# Patient Record
Sex: Female | Born: 1960 | Race: Black or African American | Hispanic: No | Marital: Married | Smoking: Never smoker
Health system: Southern US, Community
[De-identification: ages and names within clinical notes are randomized; demographics above are authoritative.]

## PROBLEM LIST (undated history)

## (undated) ENCOUNTER — Emergency Department (HOSPITAL_COMMUNITY): Discharge status: Absent without leave | Payer: Self-pay

## (undated) DIAGNOSIS — D219 Benign neoplasm of connective and other soft tissue, unspecified: Secondary | ICD-10-CM

## (undated) DIAGNOSIS — M25551 Pain in right hip: Secondary | ICD-10-CM

## (undated) DIAGNOSIS — I10 Essential (primary) hypertension: Secondary | ICD-10-CM

## (undated) DIAGNOSIS — IMO0002 Reserved for concepts with insufficient information to code with codable children: Secondary | ICD-10-CM

## (undated) DIAGNOSIS — E785 Hyperlipidemia, unspecified: Secondary | ICD-10-CM

## (undated) DIAGNOSIS — K219 Gastro-esophageal reflux disease without esophagitis: Secondary | ICD-10-CM

## (undated) DIAGNOSIS — C641 Malignant neoplasm of right kidney, except renal pelvis: Secondary | ICD-10-CM

## (undated) DIAGNOSIS — Z9889 Other specified postprocedural states: Secondary | ICD-10-CM

## (undated) DIAGNOSIS — G459 Transient cerebral ischemic attack, unspecified: Secondary | ICD-10-CM

## (undated) DIAGNOSIS — D49519 Neoplasm of unspecified behavior of unspecified kidney: Secondary | ICD-10-CM

## (undated) DIAGNOSIS — M1990 Unspecified osteoarthritis, unspecified site: Secondary | ICD-10-CM

## (undated) DIAGNOSIS — R87619 Unspecified abnormal cytological findings in specimens from cervix uteri: Secondary | ICD-10-CM

## (undated) DIAGNOSIS — R87629 Unspecified abnormal cytological findings in specimens from vagina: Secondary | ICD-10-CM

## (undated) DIAGNOSIS — R112 Nausea with vomiting, unspecified: Secondary | ICD-10-CM

## (undated) DIAGNOSIS — D251 Intramural leiomyoma of uterus: Secondary | ICD-10-CM

## (undated) DIAGNOSIS — Z780 Asymptomatic menopausal state: Secondary | ICD-10-CM

## (undated) HISTORY — DX: Unspecified abnormal cytological findings in specimens from cervix uteri: R87.619

## (undated) HISTORY — PX: PARTIAL HYSTERECTOMY: SHX80

## (undated) HISTORY — PX: ROTATOR CUFF REPAIR: SHX139

## (undated) HISTORY — DX: Benign neoplasm of connective and other soft tissue, unspecified: D21.9

## (undated) HISTORY — DX: Hyperlipidemia, unspecified: E78.5

## (undated) HISTORY — DX: Pain in right hip: M25.551

## (undated) HISTORY — DX: Malignant neoplasm of right kidney, except renal pelvis: C64.1

## (undated) HISTORY — DX: Asymptomatic menopausal state: Z78.0

## (undated) HISTORY — PX: TUBAL LIGATION: SHX77

## (undated) HISTORY — DX: Unspecified osteoarthritis, unspecified site: M19.90

## (undated) HISTORY — DX: Unspecified abnormal cytological findings in specimens from vagina: R87.629

## (undated) HISTORY — DX: Intramural leiomyoma of uterus: D25.1

## (undated) HISTORY — DX: Reserved for concepts with insufficient information to code with codable children: IMO0002

---

## 2001-02-14 ENCOUNTER — Other Ambulatory Visit: Admission: RE | Admit: 2001-02-14 | Discharge: 2001-02-14 | Payer: Self-pay | Admitting: Obstetrics and Gynecology

## 2001-07-10 ENCOUNTER — Encounter: Payer: Self-pay | Admitting: Family Medicine

## 2001-07-10 ENCOUNTER — Ambulatory Visit (HOSPITAL_COMMUNITY): Admission: RE | Admit: 2001-07-10 | Discharge: 2001-07-10 | Payer: Self-pay | Admitting: Family Medicine

## 2001-12-17 ENCOUNTER — Emergency Department (HOSPITAL_COMMUNITY): Admission: EM | Admit: 2001-12-17 | Discharge: 2001-12-17 | Payer: Self-pay | Admitting: Internal Medicine

## 2003-01-24 ENCOUNTER — Encounter: Payer: Self-pay | Admitting: Specialist

## 2003-01-24 ENCOUNTER — Ambulatory Visit (HOSPITAL_COMMUNITY): Admission: RE | Admit: 2003-01-24 | Discharge: 2003-01-24 | Payer: Self-pay | Admitting: Specialist

## 2005-06-28 ENCOUNTER — Ambulatory Visit (HOSPITAL_COMMUNITY): Admission: RE | Admit: 2005-06-28 | Discharge: 2005-06-28 | Payer: Self-pay | Admitting: Specialist

## 2006-12-19 ENCOUNTER — Other Ambulatory Visit: Admission: RE | Admit: 2006-12-19 | Discharge: 2006-12-19 | Payer: Self-pay | Admitting: Obstetrics and Gynecology

## 2006-12-19 ENCOUNTER — Ambulatory Visit (HOSPITAL_COMMUNITY): Admission: RE | Admit: 2006-12-19 | Discharge: 2006-12-19 | Payer: Self-pay | Admitting: Obstetrics and Gynecology

## 2007-12-07 ENCOUNTER — Ambulatory Visit (HOSPITAL_COMMUNITY): Admission: RE | Admit: 2007-12-07 | Discharge: 2007-12-07 | Payer: Self-pay | Admitting: Family Medicine

## 2008-01-16 ENCOUNTER — Other Ambulatory Visit: Admission: RE | Admit: 2008-01-16 | Discharge: 2008-01-16 | Payer: Self-pay | Admitting: Obstetrics and Gynecology

## 2008-01-16 ENCOUNTER — Ambulatory Visit (HOSPITAL_COMMUNITY): Admission: RE | Admit: 2008-01-16 | Discharge: 2008-01-16 | Payer: Self-pay | Admitting: Obstetrics and Gynecology

## 2009-01-27 ENCOUNTER — Other Ambulatory Visit: Admission: RE | Admit: 2009-01-27 | Discharge: 2009-01-27 | Payer: Self-pay | Admitting: Obstetrics and Gynecology

## 2009-01-28 ENCOUNTER — Ambulatory Visit (HOSPITAL_COMMUNITY): Admission: RE | Admit: 2009-01-28 | Discharge: 2009-01-28 | Payer: Self-pay | Admitting: Obstetrics and Gynecology

## 2009-06-10 ENCOUNTER — Ambulatory Visit (HOSPITAL_COMMUNITY): Admission: RE | Admit: 2009-06-10 | Discharge: 2009-06-10 | Payer: Self-pay | Admitting: Family Medicine

## 2009-06-20 ENCOUNTER — Encounter: Payer: Self-pay | Admitting: Orthopedic Surgery

## 2009-06-30 ENCOUNTER — Ambulatory Visit: Payer: Self-pay | Admitting: Orthopedic Surgery

## 2009-06-30 DIAGNOSIS — M7120 Synovial cyst of popliteal space [Baker], unspecified knee: Secondary | ICD-10-CM | POA: Insufficient documentation

## 2009-06-30 DIAGNOSIS — IMO0002 Reserved for concepts with insufficient information to code with codable children: Secondary | ICD-10-CM | POA: Insufficient documentation

## 2009-07-02 ENCOUNTER — Telehealth: Payer: Self-pay | Admitting: Orthopedic Surgery

## 2009-07-03 ENCOUNTER — Ambulatory Visit (HOSPITAL_COMMUNITY): Admission: RE | Admit: 2009-07-03 | Discharge: 2009-07-03 | Payer: Self-pay | Admitting: Orthopedic Surgery

## 2009-07-14 ENCOUNTER — Ambulatory Visit: Payer: Self-pay | Admitting: Orthopedic Surgery

## 2009-07-14 DIAGNOSIS — M2240 Chondromalacia patellae, unspecified knee: Secondary | ICD-10-CM | POA: Insufficient documentation

## 2009-07-29 ENCOUNTER — Encounter: Payer: Self-pay | Admitting: Orthopedic Surgery

## 2009-07-29 ENCOUNTER — Encounter (HOSPITAL_COMMUNITY): Admission: RE | Admit: 2009-07-29 | Discharge: 2009-08-28 | Payer: Self-pay | Admitting: Orthopedic Surgery

## 2009-08-26 ENCOUNTER — Encounter: Payer: Self-pay | Admitting: Orthopedic Surgery

## 2009-08-28 ENCOUNTER — Encounter: Payer: Self-pay | Admitting: Orthopedic Surgery

## 2009-08-29 ENCOUNTER — Encounter (HOSPITAL_COMMUNITY): Admission: RE | Admit: 2009-08-29 | Discharge: 2009-09-28 | Payer: Self-pay | Admitting: Orthopedic Surgery

## 2009-09-12 ENCOUNTER — Encounter: Payer: Self-pay | Admitting: Orthopedic Surgery

## 2009-09-18 ENCOUNTER — Encounter: Payer: Self-pay | Admitting: Orthopedic Surgery

## 2009-12-22 ENCOUNTER — Ambulatory Visit: Payer: Self-pay | Admitting: Orthopedic Surgery

## 2009-12-22 DIAGNOSIS — M79609 Pain in unspecified limb: Secondary | ICD-10-CM | POA: Insufficient documentation

## 2009-12-30 ENCOUNTER — Encounter: Payer: Self-pay | Admitting: Orthopedic Surgery

## 2010-01-21 ENCOUNTER — Ambulatory Visit: Payer: Self-pay | Admitting: Orthopedic Surgery

## 2010-01-29 ENCOUNTER — Other Ambulatory Visit: Admission: RE | Admit: 2010-01-29 | Discharge: 2010-01-29 | Payer: Self-pay | Admitting: Obstetrics and Gynecology

## 2010-01-30 ENCOUNTER — Ambulatory Visit (HOSPITAL_COMMUNITY)
Admission: RE | Admit: 2010-01-30 | Discharge: 2010-01-30 | Payer: Self-pay | Source: Home / Self Care | Admitting: Obstetrics & Gynecology

## 2010-02-10 ENCOUNTER — Ambulatory Visit (HOSPITAL_COMMUNITY): Admission: RE | Admit: 2010-02-10 | Discharge: 2010-02-10 | Payer: Self-pay | Admitting: Obstetrics & Gynecology

## 2010-03-25 ENCOUNTER — Ambulatory Visit: Payer: Self-pay | Admitting: Orthopedic Surgery

## 2010-03-31 ENCOUNTER — Encounter: Payer: Self-pay | Admitting: Orthopedic Surgery

## 2010-05-12 NOTE — Assessment & Plan Note (Signed)
Summary: lft knee pain xrays [facility]   Vital Signs:  Patient profile:   50 year old female Height:      63 inches Weight:      213 pounds Pulse rate:   76 / minute Resp:     18 per minute  Vitals Entered By: Fuller Canada MD (June 30, 2009 3:42 PM)  Visit Type:  Initial Consult Referring Provider:  DR. Simone Curia Primary Provider:  DR. Simone Curia  CC:  left knee pain.  History of Present Illness: 50 year old female presents with LEFT knee pain which is sharp dull medium were moderate in severity, 6/10, periods intermittent occurs morning and night came on gradually relieved somewhat with ibuprofen 800 appears to have some swelling as well.  Symptoms for 5 months no injury no therapy  popliteal cyst   Xrays [facility] 06/10/09 left knee.this x-ray shows medial compartment joint space narrowing minimal.  I agree with the report    Meds: HCTZ, Cefpozil, Ibuprofen as needed.    Allergies (verified): 1)  ! * Protopic  Past History:  Past Medical History: HTN ASTHMA  Past Surgical History: NA  Family History: Hx, family, asthma  Social History: Patient is married.  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 Cardiovascular:  Denies chest pain, palpitations, fainting, and murmurs. Respiratory:  Complains of couch; denies short of breath, wheezing, tightness, pain on inspiration, and snoring . Gastrointestinal:  Denies heartburn, nausea, vomiting, diarrhea, constipation, and blood in your stools. Genitourinary:  Denies frequency, urgency, difficulty urinating, painful urination, flank pain, and bleeding in urine. Neurologic:  Denies numbness, tingling, unsteady gait, dizziness, tremors, and seizure. Musculoskeletal:  Complains of swelling, stiffness, and muscle pain; denies joint pain, instability, redness, and heat. Endocrine:   Denies excessive thirst, exessive urination, and heat or cold intolerance. Psychiatric:  Denies nervousness, depression, anxiety, and hallucinations. Skin:  Complains of rash; denies changes in the skin, poor healing, itching, and redness. HEENT:  Denies blurred or double vision, eye pain, redness, and watering. Immunology:  Denies seasonal allergies, sinus problems, and allergic to bee stings. Hemoatologic:  Complains of brusing; denies easy bleeding.  Physical Exam  Additional Exam:  GEN: well developed, well nourished, normal grooming and hygiene, no deformity and large body habitus  CDV: pulses are normal, no edema, no erythema. no tenderness  Lymph: normal lymph nodes   Skin: no rashes, skin lesions or open sores   NEURO: normal coordination, reflexes, sensation.   Psyche: awake, alert and oriented. Mood normal   Gait: normal  RIGHT knee  Inspection medial joint line tenderness appears to have a cyst in the back of the knee ROM normal Motor grade 5 motor strength Stability no instability detected    McMurrays sign positive    Impression & Recommendations:  Problem # 1:  POPLITEAL CYST, LEFT (ICD-727.51) Assessment New  Orders: New Patient Level III (16109)  Problem # 2:  MEDIAL MENISCUS TEAR, LEFT (ICD-836.0) Assessment: New  meniscal tear with popliteal cyst.  The cyst is most likely coming from intra-articular pathology so an MRI is ordered  Orders: New Patient Level III (60454)  Patient Instructions: 1)  MRI LEFT KNEE 2)  Please schedule a follow-up appointment in 2 weeks. 3)  continue ibuprofen for now

## 2010-05-12 NOTE — Miscellaneous (Signed)
Summary: Rehab referral ofrm  Rehab referral ofrm   Imported By: Jacklynn Ganong 09/02/2009 16:24:47  _____________________________________________________________________  External Attachment:    Type:   Image     Comment:   External Document

## 2010-05-12 NOTE — Assessment & Plan Note (Signed)
Summary: MRI results from [facility]   Visit Type:  Follow-up Referring Provider:  DR. Simone Curia Primary Provider:  DR. Simone Curia  CC:  mri results left knee.  History of Present Illness: 50 year old female presents with LEFT knee pain which is sharp dull medium - moderate in severity, 6/10, periods of intermittent pain which occurs morning and night came on gradually relieved somewhat with ibuprofen 800 appears to have some swelling as well.  Symptoms for 5 months no injury no therapy  I she may have had a medial meniscal tear based on her exam so she got an MRI.  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  Image: MRI left knee 07/03/09 [facility] for review. hospital plain knee film shows medial joint space narrowing  Meds: HCTZ, Cefpozil, Ibuprofen as needed.   recommend physical therapy.  Ibuprofen.  Follow up as needed   Allergies: 1)  ! * Protopic   Other Orders: Est. Patient Level II (16109)  Patient Instructions: 1)  Ibuprofen three times a day  2)  PT x 6 weeks  3)  Please schedule a follow-up appointment as needed.

## 2010-05-12 NOTE — Assessment & Plan Note (Signed)
Summary: 1 M RE-CK LT FOOT/BCBS/CAF   Visit Type:  Follow-up Referring Provider:  DR. Simone Curia Primary Provider:  DR. Simone Curia  CC:  recheck left foot.  History of Present Illness: I saw Jo Hale in the office today for a  visit.  She is a 50 years old woman with the complaint of:  left foot pain.  The patientc/o  intermittent severe dorsal foot pain associated dorsal foot swelling which is intermittent.  It seems to come and go.  There is no history of trauma.  It is an aching type of pain.  We treated her with ibuprofen and Aspercreme.  She has some plantar fasciitis and has already been given some orthotics by another clinic.  Her one month followup is today she notes that she has improved but has some discomfort at the end of her shift.  Recommend continued Aspercreme and followup in 2 months        Allergies: 1)  ! * Protopic   Impression & Recommendations:  Problem # 1:  FOOT PAIN (ICD-729.5) Assessment Improved  Orders: Est. Patient Level II (11914)  Patient Instructions: 1)  Continue aspercreme and f/u in 2 months

## 2010-05-12 NOTE — Letter (Signed)
Summary: History form  History form   Imported By: Jacklynn Ganong 07/09/2009 09:05:02  _____________________________________________________________________  External Attachment:    Type:   Image     Comment:   External Document

## 2010-05-12 NOTE — Miscellaneous (Signed)
Summary: PT Clinical evaluation  PT Clinical evaluation   Imported By: Jacklynn Ganong 08/13/2009 08:17:42  _____________________________________________________________________  External Attachment:    Type:   Image     Comment:   External Document

## 2010-05-12 NOTE — Letter (Signed)
Summary: History form  History form   Imported By: Jacklynn Ganong 12/30/2009 16:39:20  _____________________________________________________________________  External Attachment:    Type:   Image     Comment:   External Document

## 2010-05-12 NOTE — Progress Notes (Signed)
Summary: MRI appointment.  Phone Note Outgoing Call   Call placed by: Waldon Reining,  July 02, 2009 2:23 PM Call placed to: Patient Action Taken: Appt scheduled Summary of Call: I called to give the patient her MRI appointment at [facility] on 07-03-09 at 7:30 am. Patient has BCBS primary, no precert is needed. She has BCBS secondary, authorization [id number]. Patient will follow up here at out office.

## 2010-05-12 NOTE — Miscellaneous (Signed)
Summary: PT Order  PT Order   Imported By: Cammie Sickle 08/02/2009 12:46:16  _____________________________________________________________________  External Attachment:    Type:   Image     Comment:   External Document

## 2010-05-12 NOTE — Miscellaneous (Signed)
Summary: PT progress note  PT progress note   Imported By: Jacklynn Ganong 09/30/2009 10:55:44  _____________________________________________________________________  External Attachment:    Type:   Image     Comment:   External Document

## 2010-05-12 NOTE — Miscellaneous (Signed)
Summary: PT progress note  PT progress note   Imported By: Jacklynn Ganong 09/05/2009 09:11:30  _____________________________________________________________________  External Attachment:    Type:   Image     Comment:   External Document

## 2010-05-12 NOTE — Assessment & Plan Note (Signed)
Summary: NEW PROBLEM LEFT FOOT PAIN NEEDS XR/BCBS/BSF   Vital Signs:  Patient profile:   50 year old female Height:      65 inches Weight:      214 pounds Pulse rate:   68 / minute Resp:     16 per minute  Vitals Entered By: Fuller Canada MD (December 22, 2009 9:25 AM)  Visit Type:  new problem Referring Provider:  DR. Simone Curia Primary Provider:  DR. Simone Curia  CC:  left foot pain.  History of Present Illness: I saw Jo Hale in the office today for a  visit.  She is a 50 years old woman with the complaint of:  left foot pain.  The patientc/o  intermittent severe dorsal foot pain associated dorsal foot swelling which is intermittent.  It seems to come and go.  There is no history of trauma.  It is an aching type of pain.  No injury.  Xrays today.  Meds: Ibuprofen 800mg  as needed.    Allergies: 1)  ! 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 Cardiovascular:  Denies chest pain, palpitations, fainting, and murmurs. Respiratory:  Denies short of breath, wheezing, couch, tightness, pain on inspiration, and snoring . Gastrointestinal:  Complains of heartburn; denies nausea, vomiting, diarrhea, constipation, and blood in your stools. Genitourinary:  Denies frequency, urgency, difficulty urinating, painful urination, flank pain, and bleeding in urine. Neurologic:  Denies numbness, tingling, unsteady gait, dizziness, tremors, and seizure. Musculoskeletal:  Complains of swelling and muscle pain; denies joint pain, instability, stiffness, redness, and heat. Endocrine:  Denies excessive thirst, exessive urination, and heat or cold intolerance. Psychiatric:  Denies nervousness, depression, anxiety, and hallucinations. Skin:  Denies changes in the skin, poor healing, rash,  itching, and redness. HEENT:  Complains of eye pain; denies blurred or double vision, redness, and watering. Immunology:  Denies seasonal allergies, sinus problems, and allergic to bee stings. Hemoatologic:  Denies easy bleeding and brusing.  Physical Exam  Additional Exam:  Normal appearance.  height 5 feet 5 weight 214 She is oriented x3 with normal  mood and affect.   Gait and station are normal.   Her LEFT foot is tender with the dorsal tarsal prominence at the tarsometatarsal joint with no pain with TMT stress testing.   Muscle tone is normal  skin is intact there is no mass felt  pulses good  temperature is normal  no edema  sensation remains normal.     Impression & Recommendations:  Problem # 1:  FOOT PAIN (ICD-729.5) Assessment New  x-rays are obtained multiple views of the LEFT foot which shows:no specific abnormality.  Suspect tendinitis.  She does have a tarsal boss consistent with a carpal type boss seen in the hand  Recommend topical medication 3 times a day with Aspercreme.  Return in one month  Orders: Est. Patient Level IV (60454) Foot x-ray complete, minimum 3 views (09811)  Patient Instructions: 1)  Please schedule a follow-up appointment in 1 month.  Appended Document: NEW PROBLEM LEFT FOOT PAIN NEEDS XR/BCBS/BSF Allergies (verified): 1)  ! * Protopic  Past History:  Past Medical History: HTN ASTHMA  Past Surgical History: NA

## 2010-05-12 NOTE — Letter (Signed)
Summary: Letter of medical necessity  Letter of medical necessity   Imported By: Jacklynn Ganong 09/29/2009 13:16:39  _____________________________________________________________________  External Attachment:    Type:   Image     Comment:   External Document

## 2010-05-14 NOTE — Medication Information (Signed)
Summary: Tax adviser   Imported By: Eugenio Hoes 04/01/2010 11:11:13  _____________________________________________________________________  External Attachment:    Type:   Image     Comment:   External Document

## 2010-05-14 NOTE — Assessment & Plan Note (Signed)
Summary: 2 M RE-CK LT FOOT/BCBS/CAF   Visit Type:  Follow-up Referring Provider:  DR. Simone Curia Primary Provider:  DR. Simone Curia  CC:  recheck left foot.  History of Present Illness: I saw Jo Hale in the office today for a  visit.  She is a 50 years old woman with the complaint of:  left foot pain.  The patientc/o  intermittent severe dorsal foot pain associated dorsal foot swelling which is intermittent.  It seems to come and go.  There is no history of trauma.  It is an aching type of pain.  We treated her with ibuprofen and Aspercreme.  She has some plantar fasciitis and has already been given some orthotics by another clinic.  Today is recheck after using aspercreme for the left foot.  Is doing better, swelling is down, brough her shoes that she has to work in.  Still uses aspercreme and Ibuprofen as needed only.  No tenderness at the dorsum of the foot the swelling has gone down.  I checked her orthotics they seem to be a little bit wide and need to be adjusted.  I asked her to get these adjusted by the facility that made them.  If they can't adjust them to fit better than I ordered Spenco orthotics for her.  Plan continue current treatment regimen          Allergies: 1)  ! * Protopic   Impression & Recommendations:  Problem # 1:  FOOT PAIN (ICD-729.5) Assessment Improved  Orders: Est. Patient Level II (16109)  Patient Instructions: 1)  Spenco orthotics if regular orthotics can't be adjusted  2)  continue aspercreme and ibuprofen  3)  Please schedule a follow-up appointment as needed.   Orders Added: 1)  Est. Patient Level II [60454]

## 2011-01-25 ENCOUNTER — Other Ambulatory Visit: Payer: Self-pay | Admitting: Adult Health

## 2011-01-25 DIAGNOSIS — Z139 Encounter for screening, unspecified: Secondary | ICD-10-CM

## 2011-02-01 ENCOUNTER — Other Ambulatory Visit: Payer: Self-pay | Admitting: Adult Health

## 2011-02-01 ENCOUNTER — Other Ambulatory Visit (HOSPITAL_COMMUNITY)
Admission: RE | Admit: 2011-02-01 | Discharge: 2011-02-01 | Disposition: A | Discharge status: Home / Self Care | Payer: BC Managed Care – PPO | Source: Ambulatory Visit | Attending: Obstetrics and Gynecology | Admitting: Obstetrics and Gynecology

## 2011-02-01 DIAGNOSIS — Z01419 Encounter for gynecological examination (general) (routine) without abnormal findings: Secondary | ICD-10-CM | POA: Insufficient documentation

## 2011-02-04 ENCOUNTER — Telehealth: Payer: Self-pay

## 2011-02-04 NOTE — Telephone Encounter (Signed)
Pt referred for a screening colonoscopy. LMOM to call.

## 2011-02-08 NOTE — Telephone Encounter (Signed)
Pt left VM that she works nights and she will call back on Mon. Morning.

## 2011-02-11 NOTE — Telephone Encounter (Signed)
LM with husband to have pt call.

## 2011-02-15 ENCOUNTER — Ambulatory Visit (HOSPITAL_COMMUNITY): Payer: BC Managed Care – PPO

## 2011-02-15 ENCOUNTER — Other Ambulatory Visit: Payer: Self-pay

## 2011-02-15 DIAGNOSIS — Z139 Encounter for screening, unspecified: Secondary | ICD-10-CM

## 2011-02-15 NOTE — Telephone Encounter (Signed)
Rx and instructions being faxed to [facility].

## 2011-02-15 NOTE — Telephone Encounter (Signed)
Gastroenterology Pre-Procedure Form  Request Date: 02/15/2011         Requesting Physician: Cyril Mourning, FNP @ [facility]      PATIENT INFORMATION:  Jo Hale is a 50 y.o., female (DOB=29-Mar-1961).  PROCEDURE: Procedure(s) requested: colonoscopy Procedure Reason: screening for colon cancer  PATIENT REVIEW QUESTIONS: The patient reports the following:   1. Diabetes Melitis: no 2. Joint replacements in the past 12 months: no 3. Major health problems in the past 3 months: no 4. Has an artificial valve or MVP:no 5. Has been advised in past to take antibiotics in advance of a procedure like teeth cleaning: no}    MEDICATIONS & ALLERGIES:    Patient reports the following regarding taking any blood thinners:   Plavix? no Aspirin?no Coumadin?  no  Patient confirms/reports the following medications:  Current Outpatient Prescriptions  Medication Sig Dispense Refill  . ibuprofen (ADVIL,MOTRIN) 600 MG tablet Take 600 mg by mouth every 6 (six) hours as needed. Pt says she takes one daily at bedtime       . triamterene-hydrochlorothiazide (DYAZIDE) 37.5-25 MG per capsule Take 1 capsule by mouth daily.          Patient confirms/reports the following allergies:  Allergies  Allergen Reactions  . Tacrolimus     Patient is appropriate to schedule for requested procedure(s): yes  AUTHORIZATION INFORMATION Primary Insurance:   ID #:  Pre-Cert / Auth #:   Secondary Insurance:   ID #: Group #:  Pre-Cert / Auth required:  Pre-Cert / Auth #:   No orders of the defined types were placed in this encounter.    SCHEDULE INFORMATION: Procedure has been scheduled as follows:  Date: 02/19/2011    Time: 8:30 AM  Location: [facility] Short Stay  This Gastroenterology Pre-Precedure Form is being routed to the following provider(s) for review: Jonette Eva, MD

## 2011-02-15 NOTE — Telephone Encounter (Signed)
moviprep split dosing

## 2011-02-15 NOTE — Telephone Encounter (Signed)
Rx and instructions faxed to [facility].

## 2011-02-18 ENCOUNTER — Encounter (HOSPITAL_COMMUNITY): Payer: Self-pay | Admitting: Pharmacy Technician

## 2011-02-18 MED ORDER — SODIUM CHLORIDE 0.45 % IV SOLN
Freq: Once | INTRAVENOUS | Status: AC
  Administered 2011-02-19: 09:00:00 via INTRAVENOUS

## 2011-02-19 ENCOUNTER — Encounter (HOSPITAL_COMMUNITY): Payer: Self-pay | Admitting: *Deleted

## 2011-02-19 ENCOUNTER — Ambulatory Visit (HOSPITAL_COMMUNITY)
Admission: RE | Admit: 2011-02-19 | Discharge: 2011-02-19 | Disposition: A | Discharge status: Home / Self Care | Payer: BC Managed Care – PPO | Source: Ambulatory Visit | Attending: Gastroenterology | Admitting: Gastroenterology

## 2011-02-19 ENCOUNTER — Encounter (HOSPITAL_COMMUNITY)
Admission: RE | Disposition: A | Discharge status: Home / Self Care | Payer: Self-pay | Source: Ambulatory Visit | Attending: Gastroenterology

## 2011-02-19 DIAGNOSIS — Z1211 Encounter for screening for malignant neoplasm of colon: Secondary | ICD-10-CM | POA: Insufficient documentation

## 2011-02-19 DIAGNOSIS — I10 Essential (primary) hypertension: Secondary | ICD-10-CM | POA: Insufficient documentation

## 2011-02-19 DIAGNOSIS — Z79899 Other long term (current) drug therapy: Secondary | ICD-10-CM | POA: Insufficient documentation

## 2011-02-19 DIAGNOSIS — Z139 Encounter for screening, unspecified: Secondary | ICD-10-CM

## 2011-02-19 HISTORY — DX: Essential (primary) hypertension: I10

## 2011-02-19 HISTORY — DX: Gastro-esophageal reflux disease without esophagitis: K21.9

## 2011-02-19 HISTORY — PX: COLONOSCOPY: SHX5424

## 2011-02-19 LAB — HM COLONOSCOPY

## 2011-02-19 SURGERY — COLONOSCOPY
Anesthesia: Moderate Sedation

## 2011-02-19 MED ORDER — MEPERIDINE HCL 100 MG/ML IJ SOLN
INTRAMUSCULAR | Status: DC | PRN
  Administered 2011-02-19: 25 mg
  Administered 2011-02-19: 50 mg

## 2011-02-19 MED ORDER — MEPERIDINE HCL 100 MG/ML IJ SOLN
INTRAMUSCULAR | Status: AC
  Filled 2011-02-19: qty 2

## 2011-02-19 MED ORDER — MIDAZOLAM HCL 5 MG/5ML IJ SOLN
INTRAMUSCULAR | Status: DC | PRN
  Administered 2011-02-19 (×2): 2 mg via INTRAVENOUS

## 2011-02-19 MED ORDER — STERILE WATER FOR IRRIGATION IR SOLN
Status: DC | PRN
  Administered 2011-02-19: 09:00:00

## 2011-02-19 MED ORDER — MIDAZOLAM HCL 5 MG/5ML IJ SOLN
INTRAMUSCULAR | Status: AC
  Filled 2011-02-19: qty 10

## 2011-02-19 NOTE — OR Nursing (Signed)
Dr. Darrick Penna notified of patient eating a full meal at 1800 yesterday before she drank her prep. Will attempt to do colonoscopy if prep allows.

## 2011-02-19 NOTE — OR Nursing (Signed)
Patient ate steak and potatoes prep good in left side poor on right side.  Patient will need to return in 5 years per Dr> Fields order.

## 2011-02-19 NOTE — H&P (Signed)
  Primary Care Physician:  Harlow Asa, MD, MD Primary Gastroenterologist:  Dr. Darrick Penna  Pre-Procedure History & Physical: HPI:  Jo Hale is a 50 y.o. female here for AVERAGE RISK COLON CA SCREENING.  Past Medical History  Diagnosis Date  . GERD (gastroesophageal reflux disease)   . Hypertension     Past Surgical History  Procedure Date  . Cesarean section   . Tubal ligation     Prior to Admission medications   Medication Sig Start Date End Date Taking? Authorizing Provider  triamterene-hydrochlorothiazide (DYAZIDE) 37.5-25 MG per capsule Take 1 capsule by mouth daily.     Yes Historical Provider, MD  ibuprofen (ADVIL,MOTRIN) 600 MG tablet Take 600 mg by mouth every 6 (six) hours as needed. Pt says she takes one daily at bedtime     Historical Provider, MD    Allergies as of 02/15/2011 - Review Complete 02/15/2011  Allergen Reaction Noted  . Tacrolimus      Family History  Problem Relation Age of Onset  . Colon cancer Neg Hx   NO POLYPS  History   Social History  . Marital Status: Married    Spouse Name: N/A    Number of Children: N/A  . Years of Education: N/A   Occupational History  . Not on file.   Social History Main Topics  . Smoking status: Never Smoker   . Smokeless tobacco: Not on file  . Alcohol Use: No  . Drug Use: No  . Sexually Active:    Other Topics Concern  . Not on file   Social History Narrative  . No narrative on file    Review of Systems: See HPI, otherwise negative ROS   Physical Exam: BP 133/75  Pulse 73  Temp(Src) 98.3 F (36.8 C) (Oral)  Resp 23  Ht 5\' 2"  (1.575 m)  Wt 215 lb (97.523 kg)  BMI 39.32 kg/m2  SpO2 98%  LMP 02/14/2011 General:   Alert,  pleasant and cooperative in NAD Head:  Normocephalic and atraumatic. Neck:  Supple;  Lungs:  Clear throughout to auscultation.    Heart:  Regular rate and rhythm. Abdomen:  Soft, nontender and nondistended. Normal bowel sounds, without guarding, and without  rebound.   Neurologic:  Alert and  oriented x4;  grossly normal neurologically.  Impression/Plan:   AVERAGE RISK-NO Sx  PLAN: TCS TODAY

## 2011-02-23 ENCOUNTER — Ambulatory Visit (HOSPITAL_COMMUNITY)
Admission: RE | Admit: 2011-02-23 | Discharge: 2011-02-23 | Disposition: A | Discharge status: Home / Self Care | Payer: BC Managed Care – PPO | Source: Ambulatory Visit | Attending: Adult Health | Admitting: Adult Health

## 2011-02-23 ENCOUNTER — Other Ambulatory Visit: Payer: Self-pay | Admitting: Obstetrics and Gynecology

## 2011-02-23 DIAGNOSIS — Z139 Encounter for screening, unspecified: Secondary | ICD-10-CM

## 2011-02-23 DIAGNOSIS — Z1231 Encounter for screening mammogram for malignant neoplasm of breast: Secondary | ICD-10-CM | POA: Insufficient documentation

## 2011-02-24 ENCOUNTER — Encounter (HOSPITAL_COMMUNITY)
Admission: RE | Admit: 2011-02-24 | Discharge: 2011-02-24 | Disposition: A | Discharge status: Home / Self Care | Payer: BC Managed Care – PPO | Source: Ambulatory Visit | Attending: Obstetrics and Gynecology | Admitting: Obstetrics and Gynecology

## 2011-02-24 NOTE — Patient Instructions (Addendum)
20 Jo Hale  02/24/2011   Your procedure is scheduled on:  03/02/2011  Report to [facility] at  830 AM.  Call this number if you have problems the morning of surgery: [phone]   Remember:   Do not eat food:After Midnight.  Do not drink clear liquids: After Midnight.  Take these medicines the morning of surgery with A SIP OF WATER: zantac,dyazide   Do not wear jewelry, make-up or nail polish.  Do not wear lotions, powders, or perfumes. You may wear deodorant.  Do not shave 48 hours prior to surgery.  Do not bring valuables to the hospital.  Contacts, dentures or bridgework may not be worn into surgery.  Leave suitcase in the car. After surgery it may be brought to your room.  For patients admitted to the hospital, checkout time is 11:00 AM the day of discharge.   Patients discharged the day of surgery will not be allowed to drive home.  Name and phone number of your driver: family  Special Instructions: CHG Shower Use Special Wash: 1/2 bottle night before surgery and 1/2 bottle morning of surgery.   Please read over the following fact sheets that you were given: Pain Booklet, MRSA Information, Surgical Site Infection Prevention, Anesthesia Post-op Instructions and Care and Recovery After Surgery Supracervical Hysterectomy The uterus is the female organ that produces menstrual periods and carries a baby. The body of the uterus is the top part that is in the pelvis. The cervix (the opening or neck) is the bottom part that protrudes into the top of the vagina. A supracervical hysterectomy is the removal of the body of the uterus but not the cervix. This procedure can be performed with a large abdominal incision. It can also be done with a long tube with a light (laparoscopy) inserted into two small incisions in the lower abdomen. The tubes and ovaries can be removed during this operation if necessary. You will not have menstrual periods or be able to get pregnant after having this  procedure.  Women who are going to have this procedure should be tested to make sure they have a normal Pap test and no signs of precancer or cancer disease of the cervix or uterus. You should also know about the possibility of problems developing on the cervix later, which may need more surgery to remove the cervix.  Reasons this procedure is done:  Endometriosis. This is when the lining of the inside of the uterus is misplaced outside of its normal location.   Adenomyosis. This is when endometrial tissue goes in the muscle of the uterus.   Persistent abnormal bleeding.   Lasting (chronic) pelvic pain.   Uterine prolapse. This is when the uterus falls down into the vagina.  LET YOUR CAREGIVER KNOW ABOUT:  Allergies especially to any medications.   All the medications you are taking including over-the-counter medication, herbs, eye drops and creams.   Past problems with anesthesia or novocaine.   Possibility of being pregnant.   All past surgeries.   History of blood clots or bleeding problems.   Other medical problems (diabetes, kidney, heart or lung problems).  RISKS AND COMPLICATIONS  Blood clots of the leg, heart or lung.   Infection.   Severe bleeding.   Injury to other surrounding organs.   Early menopause.   Problems with the anesthesia.   More surgery later to remove the cervix if you have problems with the cervix.  BEFORE THE PROCEDURE  Do not take aspirin  or blood thinners a week before the surgery or as told by your caregiver.   Do not eat or drink anything the night before the surgery or as told by your caregiver.   Let your caregiver know if you develop a cold or any other infection.   If you are admitted the day of the surgery, show up 60 minutes before the surgery or as directed by your caregiver.  PROCEDURE  An intravenous (IV) will be placed in your arm. You will be given an anesthetic to make you sleep during the surgery. You may be given a  spinal anesthesia to numb your body from the waist down. When you wake up, you will still have the IV and also have a Foley catheter in you. A Foley catheter will drain your bladder for a day or two.  AFTER THE PROCEDURE  You will be taken to the recovery room for a couple of hours until it is safe to take you to your hospital room. Usually, you will remain in the hospital for 3 to 5 days. You may be given a medicine that kills germs (antibiotic) during and after the surgery. Pain medication will be ordered for you by your caregiver while you are in the hospital and for when you go home. HOME CARE INSTRUCTIONS  Healing will take time. You will have discomfort, tenderness, swelling and bruising at the operative site for a couple of weeks. This is normal and will get better as time goes on.   Only take over-the-counter or prescription medicines for pain, discomfort or fever as directed by your caregiver.   Do not take aspirin. It can cause bleeding.   Do not drive when taking pain medication.   Follow your caregiver's advice regarding diet, exercise, lifting, driving and general activities.   Resume your usual diet as directed and allowed.   Get plenty of rest and sleep.   Do not douche, use tampons, or have sexual intercourse until your caregiver gives you permission.   Change your bandages (dressings) as directed.   Take your temperature twice a day. Write it down.   Your caregiver may recommend showers instead of baths for a few weeks.   Do not drink alcohol until your caregiver gives you permission.   If you develop constipation, you may take a mild laxative with your caregiver's permission. Bran foods and drinking fluids helps with constipation problems.   Try to have someone home with you for a week or two to help with the household activities.   Make sure you and your family understands everything about your operation and recovery.   Do not sign any legal documents until you  feel normal again.   Keep all your follow-up appointments as recommended by your caregiver.  SEEK MEDICAL CARE IF:   There is swelling, redness or increasing pain in the wound area.   Pus is coming from the wound.   You notice a bad smell from the wound or surgical dressing.   You have pain, redness and swelling from the intravenous site.   The wound is breaking open (the edges are not staying together).   You feel dizzy or feel like fainting.   You develop pain or bleeding when you urinate.   You develop diarrhea.   You develop nausea and vomiting.   You develop abnormal vaginal discharge.   You develop a rash.   You have any type of abnormal reaction or develop an allergy to your medication.  You need stronger pain medication for your pain.  SEEK IMMEDIATE MEDICAL CARE IF:  You have an oral temperature above 102 F (38.9 C), not controlled by medicine.   You develop abdominal pain.   You develop chest pain.   You develop shortness of breath.   You pass out.   You develop pain, swelling or redness of your leg.   You develop heavy vaginal bleeding with or without blood clots.  Document Released: 09/15/2007 Document Revised: 07/24/2010 Document Reviewed: 08/15/2007 [facility] Patient Information 2012 [place], [place].PATIENT INSTRUCTIONS POST-ANESTHESIA  IMMEDIATELY FOLLOWING SURGERY:  Do not drive or operate machinery for the first twenty four hours after surgery.  Do not make any important decisions for twenty four hours after surgery or while taking narcotic pain medications or sedatives.  If you develop intractable nausea and vomiting or a severe headache please notify your doctor immediately.  FOLLOW-UP:  Please make an appointment with your surgeon as instructed. You do not need to follow up with anesthesia unless specifically instructed to do so.  WOUND CARE INSTRUCTIONS (if applicable):  Keep a dry clean dressing on the anesthesia/puncture wound site if  there is drainage.  Once the wound has quit draining you may leave it open to air.  Generally you should leave the bandage intact for twenty four hours unless there is drainage.  If the epidural site drains for more than 36-48 hours please call the anesthesia department.  QUESTIONS?:  Please feel free to call your physician or the hospital operator if you have any questions, and they will be happy to assist you.     [facility] Anesthesia Department [address] [place] [phone]

## 2011-02-26 ENCOUNTER — Encounter (HOSPITAL_COMMUNITY): Payer: Self-pay

## 2011-02-26 ENCOUNTER — Other Ambulatory Visit: Payer: Self-pay | Admitting: Obstetrics and Gynecology

## 2011-02-26 ENCOUNTER — Encounter (HOSPITAL_COMMUNITY)
Admission: RE | Admit: 2011-02-26 | Discharge: 2011-02-26 | Disposition: A | Discharge status: Home / Self Care | Payer: BC Managed Care – PPO | Source: Ambulatory Visit | Attending: Obstetrics and Gynecology | Admitting: Obstetrics and Gynecology

## 2011-02-26 ENCOUNTER — Other Ambulatory Visit: Payer: Self-pay

## 2011-02-26 ENCOUNTER — Encounter (HOSPITAL_COMMUNITY): Payer: Self-pay | Admitting: Gastroenterology

## 2011-02-26 LAB — BASIC METABOLIC PANEL
BUN: 10 mg/dL (ref 6–23)
Calcium: 9.4 mg/dL (ref 8.4–10.5)
GFR calc Af Amer: 65 mL/min — ABNORMAL LOW (ref >90)
GFR calc non Af Amer: 56 mL/min — ABNORMAL LOW (ref >90)
Glucose, Bld: 92 mg/dL (ref 70–99)
Potassium: 3.7 mEq/L (ref 3.5–5.1)
Sodium: 138 mEq/L (ref 135–145)

## 2011-02-26 LAB — CBC
Hemoglobin: 11.9 g/dL — ABNORMAL LOW (ref 12.0–15.0)
MCH: 31.9 pg (ref 26.0–34.0)
MCHC: 34.2 g/dL (ref 30.0–36.0)
Platelets: 281 10*3/uL (ref 150–400)
RBC: 3.73 MIL/uL — ABNORMAL LOW (ref 3.87–5.11)

## 2011-02-26 LAB — SURGICAL PCR SCREEN
MRSA, PCR: NEGATIVE
Staphylococcus aureus: NEGATIVE

## 2011-02-26 LAB — HCG, SERUM, QUALITATIVE: Preg, Serum: NEGATIVE

## 2011-02-26 MED ORDER — PEG 3350-KCL-NABCB-NACL-NASULF 236 G PO SOLR: 4000.0000 mL | Freq: Once | ORAL | Status: DC

## 2011-02-26 NOTE — Patient Instructions (Addendum)
20 Jo Hale  02/26/2011   Your procedure is scheduled on:  03/02/2011  Report to [facility] at 06:15 AM.  Call this number if you have problems the morning of surgery: [phone]   Remember:   Do not eat food:After Midnight.  Do not drink clear liquids: After Midnight.  Take these medicines the morning of surgery with A SIP OF WATER:    Do not wear jewelry, make-up or nail polish.  Do not wear lotions, powders, or perfumes. You may wear deodorant.  Do not shave 48 hours prior to surgery.  Do not bring valuables to the hospital.  Contacts, dentures or bridgework may not be worn into surgery.  Leave suitcase in the car. After surgery it may be brought to your room.  For patients admitted to the hospital, checkout time is 11:00 AM the day of discharge.   Patients discharged the day of surgery will not be allowed to drive home.  Name and phone number of your driver:   Special Instructions: CHG Shower Shower 2 days before surgery and 1 day before surgery with Hibiclens.   Please read over the following fact sheets that you were given: Pain Booklet, Blood Transfusion Information, MRSA Information, Surgical Site Infection Prevention, Anesthesia Post-op Instructions and Care and Recovery After Surgery

## 2011-03-01 ENCOUNTER — Other Ambulatory Visit: Payer: Self-pay | Admitting: Obstetrics and Gynecology

## 2011-03-01 NOTE — H&P (Signed)
Jo Hale is an 50 y.o. female. Who is admitted for laparoscopic supracervical hysterectomy for uterine fibroids 12-14 week size Pap smear is most recently normal endometrial biopsy shows benign proliferative endometrium risks of procedure have been reviewed with patient including need for transfusion, conversion to open procedure or postoperative complications including bleeding infection or complications involving adjacent organs such as bowel ureters or bladder.  Ultrasound 02/08/2011 shows uterine enlargement to 13 x 10 x 8 cm with multiple small fundal fibroids ovaries are normal by ultrasound and no adnexal abnormalities are noted  Pertinent Gynecological History: Menses: regular every month without intermenstrual spotting Bleeding:  Contraception: tubal ligation DES exposure: unknown Blood transfusions: none Sexually transmitted diseases: no past history Previous GYN Procedures: Cesarean section x1 tubal ligation  Last mammogram: normal Date: 02/10/2010 Last pap: normal Date: 01/29/2010, 02/01/2011 OB History: G1, P1   Menstrual History: Menarche age:  Patient's last menstrual period was 02/17/2011.    Past Medical History  Diagnosis Date  . GERD (gastroesophageal reflux disease)   . Hypertension   . Fibroids, intramural     450-600 gm uterus    Past Surgical History  Procedure Date  . Cesarean section   . Tubal ligation   . Colonoscopy 02/19/2011    Procedure: COLONOSCOPY;  Surgeon: Sandi M Fields, MD;  Location: [facility];  Service: Endoscopy;  Laterality: N/A;  9:30 AM    Family History  Problem Relation Age of Onset  . Colon cancer Neg Hx     Social History:  reports that she has never smoked. She does not have any smokeless tobacco history on file. She reports that she does not drink alcohol or use illicit drugs.  Allergies:  Allergies  Allergen Reactions  . Adhesive (Tape) Rash     (Not in a hospital admission)  ROSReview of Systems -  Negative except mild deep thrust dyspareunia, single episode left lower quadrant pain 2 months ago lasting 2 days felt likely related to ovulation  now resolved Genito-Urinary ROS: no dysuria, trouble voiding, or hematuria positive for - dyspareunia and pelvic pain  Last menstrual period 02/17/2011. Physical ExamPhysical Examination: General appearance - alert, well appearing, and in no distress, oriented to person, place, and time, overweight and in good Gen. overall health Mouth - dental hygiene good and Oral airway good Chest - clear to auscultation, no wheezes, rales or rhonchi, symmetric air entry Heart - normal rate and regular rhythm Abdomen - soft, nontender, nondistended, no masses or organomegaly Obese lower abdomen well-healed cesarean section scar Pelvic - VULVA: normal appearing vulva with no masses, tenderness or lesions,  VAGINA: normal appearing vagina with normal color and discharge, no lesions, DNA probe for chlamydia and GC obtained, and negative  CERVIX: normal appearing cervix without discharge or lesions,  UTERUS: uterus is normal size, shape, consistency and nontender, retroverted, enlarged to 12 to 14 week's size, ADNEXA: normal adnexa in size, nontender and no masses, exam limited by body habitus, exam chaperoned by CP Extremities - Homan's sign negative bilaterally  No results found for this or any previous visit (from the past 24 hour(s)). CBC    Component Value Date/Time   WBC 3.8* 02/26/2011 1430   RBC 3.73* 02/26/2011 1430   HGB 11.9* 02/26/2011 1430   HCT 34.8* 02/26/2011 1430   PLT 281 02/26/2011 1430   MCV 93.3 02/26/2011 1430   MCH 31.9 02/26/2011 1430   MCHC 34.2 02/26/2011 1430   RDW 13.5 02/26/2011 1430   BMET      Component Value Date/Time   NA 138 02/26/2011 1430   K 3.7 02/26/2011 1430   CL 105 02/26/2011 1430   CO2 27 02/26/2011 1430   GLUCOSE 92 02/26/2011 1430   BUN 10 02/26/2011 1430   CREATININE 1.12* 02/26/2011 1430   CALCIUM 9.4  02/26/2011 1430   GFRNONAA 56* 02/26/2011 1430   GFRAA 65* 02/26/2011 1430    No results found.  Assessment/Plan:  uterine fibroids 12-14 week size with mild symptoms of pressure and dyspareunia, requesting surgical removal Risks of procedure rationale treatment options reviewed with patient likelihood of completing procedure abdominally listed at 90% with laparotomy possibly necessary and discussed with patient using [facility] patient instruction booklets, with preoperative bowel prep. Dr.Eure to assist.  Jo Hale V 03/01/2011, 8:35 AM  

## 2011-03-02 ENCOUNTER — Encounter (HOSPITAL_COMMUNITY): Payer: Self-pay | Admitting: *Deleted

## 2011-03-02 ENCOUNTER — Ambulatory Visit (HOSPITAL_COMMUNITY)
Admission: RE | Admit: 2011-03-02 | Discharge: 2011-03-03 | Disposition: A | Discharge status: Home / Self Care | Payer: BC Managed Care – PPO | Source: Ambulatory Visit | Attending: Obstetrics and Gynecology | Admitting: Obstetrics and Gynecology

## 2011-03-02 ENCOUNTER — Ambulatory Visit (HOSPITAL_COMMUNITY): Payer: BC Managed Care – PPO | Admitting: Anesthesiology

## 2011-03-02 ENCOUNTER — Encounter (HOSPITAL_COMMUNITY)
Admission: RE | Disposition: A | Discharge status: Home / Self Care | Payer: Self-pay | Source: Ambulatory Visit | Attending: Obstetrics and Gynecology

## 2011-03-02 ENCOUNTER — Encounter (HOSPITAL_COMMUNITY): Payer: Self-pay | Admitting: Anesthesiology

## 2011-03-02 ENCOUNTER — Other Ambulatory Visit: Payer: Self-pay | Admitting: Obstetrics and Gynecology

## 2011-03-02 DIAGNOSIS — Z79899 Other long term (current) drug therapy: Secondary | ICD-10-CM | POA: Insufficient documentation

## 2011-03-02 DIAGNOSIS — I10 Essential (primary) hypertension: Secondary | ICD-10-CM | POA: Insufficient documentation

## 2011-03-02 DIAGNOSIS — D251 Intramural leiomyoma of uterus: Secondary | ICD-10-CM | POA: Diagnosis present

## 2011-03-02 DIAGNOSIS — D259 Leiomyoma of uterus, unspecified: Secondary | ICD-10-CM | POA: Insufficient documentation

## 2011-03-02 DIAGNOSIS — IMO0002 Reserved for concepts with insufficient information to code with codable children: Secondary | ICD-10-CM | POA: Insufficient documentation

## 2011-03-02 DIAGNOSIS — N852 Hypertrophy of uterus: Secondary | ICD-10-CM | POA: Insufficient documentation

## 2011-03-02 HISTORY — PX: LAPAROSCOPIC SUPRACERVICAL HYSTERECTOMY: SHX5399

## 2011-03-02 SURGERY — HYSTERECTOMY, SUPRACERVICAL, LAPAROSCOPIC
Anesthesia: General | Site: Uterus | Wound class: Clean Contaminated

## 2011-03-02 MED ORDER — ROCURONIUM BROMIDE 50 MG/5ML IV SOLN
INTRAVENOUS | Status: AC
  Filled 2011-03-02: qty 1

## 2011-03-02 MED ORDER — PANTOPRAZOLE SODIUM 40 MG PO TBEC
40.0000 mg | DELAYED_RELEASE_TABLET | Freq: Every day | ORAL | Status: DC
  Filled 2011-03-02: qty 1

## 2011-03-02 MED ORDER — FENTANYL CITRATE 0.05 MG/ML IJ SOLN
25.0000 ug | INTRAMUSCULAR | Status: DC | PRN
  Administered 2011-03-02: 50 ug via INTRAVENOUS

## 2011-03-02 MED ORDER — PROPOFOL 10 MG/ML IV EMUL
INTRAVENOUS | Status: AC
  Filled 2011-03-02: qty 20

## 2011-03-02 MED ORDER — LIDOCAINE HCL 1 % IJ SOLN
INTRAMUSCULAR | Status: DC | PRN
  Administered 2011-03-02: 30 mg via INTRADERMAL

## 2011-03-02 MED ORDER — LACTATED RINGERS IV SOLN
INTRAVENOUS | Status: DC
  Administered 2011-03-02: 08:00:00 via INTRAVENOUS
  Administered 2011-03-02: 1000 mL via INTRAVENOUS

## 2011-03-02 MED ORDER — KETOROLAC TROMETHAMINE 30 MG/ML IJ SOLN
INTRAMUSCULAR | Status: AC
  Filled 2011-03-02: qty 1

## 2011-03-02 MED ORDER — CEFAZOLIN SODIUM 1-5 GM-% IV SOLN
INTRAVENOUS | Status: AC
  Filled 2011-03-02: qty 50

## 2011-03-02 MED ORDER — ZOLPIDEM TARTRATE 5 MG PO TABS: 5.0000 mg | ORAL_TABLET | Freq: Every evening | ORAL | Status: DC | PRN

## 2011-03-02 MED ORDER — GLYCOPYRROLATE 0.2 MG/ML IJ SOLN
INTRAMUSCULAR | Status: DC | PRN
  Administered 2011-03-02: .4 mg via INTRAVENOUS

## 2011-03-02 MED ORDER — KETOROLAC TROMETHAMINE 30 MG/ML IJ SOLN
30.0000 mg | Freq: Four times a day (QID) | INTRAMUSCULAR | Status: DC
  Administered 2011-03-03: 30 mg via INTRAVENOUS
  Filled 2011-03-02: qty 1

## 2011-03-02 MED ORDER — MIDAZOLAM HCL 5 MG/5ML IJ SOLN
INTRAMUSCULAR | Status: DC | PRN
  Administered 2011-03-02: 2 mg via INTRAVENOUS

## 2011-03-02 MED ORDER — FENTANYL CITRATE 0.05 MG/ML IJ SOLN
INTRAMUSCULAR | Status: AC
  Filled 2011-03-02: qty 2

## 2011-03-02 MED ORDER — GLYCOPYRROLATE 0.2 MG/ML IJ SOLN
INTRAMUSCULAR | Status: AC
  Filled 2011-03-02: qty 1

## 2011-03-02 MED ORDER — ONDANSETRON HCL 4 MG/2ML IJ SOLN
INTRAMUSCULAR | Status: AC
  Administered 2011-03-02: 4 mg via INTRAVENOUS
  Filled 2011-03-02: qty 2

## 2011-03-02 MED ORDER — ROCURONIUM BROMIDE 100 MG/10ML IV SOLN
INTRAVENOUS | Status: DC | PRN
  Administered 2011-03-02: 50 mg via INTRAVENOUS
  Administered 2011-03-02 (×2): 10 mg via INTRAVENOUS

## 2011-03-02 MED ORDER — LACTATED RINGERS IV SOLN: INTRAVENOUS | Status: DC

## 2011-03-02 MED ORDER — MIDAZOLAM HCL 2 MG/2ML IJ SOLN
INTRAMUSCULAR | Status: AC
  Administered 2011-03-02: 2 mg via INTRAVENOUS
  Filled 2011-03-02: qty 2

## 2011-03-02 MED ORDER — FENTANYL CITRATE 0.05 MG/ML IJ SOLN
INTRAMUSCULAR | Status: AC
  Filled 2011-03-02: qty 5

## 2011-03-02 MED ORDER — ONDANSETRON HCL 4 MG/2ML IJ SOLN: 4.0000 mg | Freq: Once | INTRAMUSCULAR | Status: DC | PRN

## 2011-03-02 MED ORDER — KETOROLAC TROMETHAMINE 30 MG/ML IJ SOLN: 30.0000 mg | Freq: Four times a day (QID) | INTRAMUSCULAR | Status: DC

## 2011-03-02 MED ORDER — CEFAZOLIN SODIUM 1-5 GM-% IV SOLN
INTRAVENOUS | Status: DC | PRN
  Administered 2011-03-02: 2 g via INTRAVENOUS

## 2011-03-02 MED ORDER — LIDOCAINE HCL (PF) 1 % IJ SOLN
INTRAMUSCULAR | Status: AC
  Filled 2011-03-02: qty 5

## 2011-03-02 MED ORDER — KETOROLAC TROMETHAMINE 30 MG/ML IJ SOLN
30.0000 mg | Freq: Once | INTRAMUSCULAR | Status: AC
  Administered 2011-03-02: 30 mg via INTRAVENOUS

## 2011-03-02 MED ORDER — PANTOPRAZOLE SODIUM 40 MG IV SOLR
40.0000 mg | INTRAVENOUS | Status: DC
  Administered 2011-03-02: 40 mg via INTRAVENOUS
  Filled 2011-03-02: qty 40

## 2011-03-02 MED ORDER — SODIUM CHLORIDE 0.9 % IR SOLN
Status: DC | PRN
  Administered 2011-03-02: 1000 mL
  Administered 2011-03-02: 3000 mL

## 2011-03-02 MED ORDER — ONDANSETRON HCL 4 MG/2ML IJ SOLN
4.0000 mg | Freq: Once | INTRAMUSCULAR | Status: AC
  Administered 2011-03-02: 4 mg via INTRAVENOUS

## 2011-03-02 MED ORDER — MIDAZOLAM HCL 2 MG/2ML IJ SOLN
1.0000 mg | INTRAMUSCULAR | Status: DC | PRN
Start: 2011-03-02 — End: 2011-03-02
  Administered 2011-03-02: 2 mg via INTRAVENOUS

## 2011-03-02 MED ORDER — FENTANYL CITRATE 0.05 MG/ML IJ SOLN
INTRAMUSCULAR | Status: DC | PRN
  Administered 2011-03-02: 50 ug via INTRAVENOUS
  Administered 2011-03-02 (×2): 100 ug via INTRAVENOUS

## 2011-03-02 MED ORDER — CEFAZOLIN SODIUM-DEXTROSE 2-3 GM-% IV SOLR: 2.0000 g | INTRAVENOUS | Status: DC

## 2011-03-02 MED ORDER — MIDAZOLAM HCL 2 MG/2ML IJ SOLN
INTRAMUSCULAR | Status: AC
  Filled 2011-03-02: qty 2

## 2011-03-02 MED ORDER — FENTANYL CITRATE 0.05 MG/ML IJ SOLN
INTRAMUSCULAR | Status: AC
  Administered 2011-03-02: 50 ug via INTRAVENOUS
  Filled 2011-03-02: qty 2

## 2011-03-02 MED ORDER — NEOSTIGMINE METHYLSULFATE 1 MG/ML IJ SOLN
INTRAMUSCULAR | Status: AC
  Filled 2011-03-02: qty 10

## 2011-03-02 MED ORDER — OXYCODONE-ACETAMINOPHEN 5-325 MG PO TABS
1.0000 | ORAL_TABLET | ORAL | Status: DC | PRN
  Administered 2011-03-02: 1 via ORAL
  Filled 2011-03-02: qty 1

## 2011-03-02 MED ORDER — NEOSTIGMINE METHYLSULFATE 1 MG/ML IJ SOLN
INTRAMUSCULAR | Status: DC | PRN
  Administered 2011-03-02: 3 mg via INTRAVENOUS

## 2011-03-02 MED ORDER — PROMETHAZINE HCL 25 MG/ML IJ SOLN
12.5000 mg | INTRAMUSCULAR | Status: DC | PRN
  Administered 2011-03-03: 12.5 mg via INTRAVENOUS
  Filled 2011-03-02: qty 1

## 2011-03-02 MED ORDER — HYDROMORPHONE HCL PF 1 MG/ML IJ SOLN: 0.2000 mg | INTRAMUSCULAR | Status: DC | PRN

## 2011-03-02 MED ORDER — SIMETHICONE 80 MG PO CHEW: 80.0000 mg | CHEWABLE_TABLET | Freq: Four times a day (QID) | ORAL | Status: DC | PRN

## 2011-03-02 MED ORDER — PROPOFOL 10 MG/ML IV EMUL
INTRAVENOUS | Status: DC | PRN
  Administered 2011-03-02: 150 mg via INTRAVENOUS

## 2011-03-02 MED ORDER — POTASSIUM CHLORIDE IN NACL 20-0.9 MEQ/L-% IV SOLN
INTRAVENOUS | Status: DC
  Administered 2011-03-02: 19:00:00 via INTRAVENOUS

## 2011-03-02 SURGICAL SUPPLY — 59 items
APPLIER CLIP UNV 5X34 EPIX (ENDOMECHANICALS) ×2 IMPLANT
BAG HAMPER (MISCELLANEOUS) ×2 IMPLANT
BAG URINE DRAINAGE (UROLOGICAL SUPPLIES) IMPLANT
BENZOIN TINCTURE PRP APPL 2/3 (GAUZE/BANDAGES/DRESSINGS) ×2 IMPLANT
BLADE MORCELLATOR LAPAROSCOPIC (BLADE) IMPLANT
BLADE SURG SZ11 CARB STEEL (BLADE) ×2 IMPLANT
CLOTH BEACON ORANGE TIMEOUT ST (SAFETY) ×2 IMPLANT
COVER LIGHT HANDLE STERIS (MISCELLANEOUS) ×4 IMPLANT
DERMABOND ADVANCED (GAUZE/BANDAGES/DRESSINGS)
DERMABOND ADVANCED .7 DNX12 (GAUZE/BANDAGES/DRESSINGS) IMPLANT
DEVICE SUT QUICK LOAD TK 5 (STAPLE) IMPLANT
DEVICE SUT TI-KNOT TK 5X26 (MISCELLANEOUS) IMPLANT
DISSECTOR BLUNT TIP ENDO 5MM (MISCELLANEOUS) IMPLANT
DRAPE PROXIMA HALF (DRAPES) IMPLANT
DRESSING COVERLET 3X1 FLEXIBLE (GAUZE/BANDAGES/DRESSINGS) ×6 IMPLANT
ELECT REM PT RETURN 9FT ADLT (ELECTROSURGICAL) ×2
ELECTRODE REM PT RTRN 9FT ADLT (ELECTROSURGICAL) ×1 IMPLANT
FILTER SMOKE EVAC LAPAROSHD (FILTER) ×2 IMPLANT
FORMALIN 10 PREFIL 480ML (MISCELLANEOUS) ×2 IMPLANT
GAUZE SPONGE 4X4 16PLY XRAY LF (GAUZE/BANDAGES/DRESSINGS) IMPLANT
GLOVE BIOGEL PI IND STRL 8 (GLOVE) ×1 IMPLANT
GLOVE BIOGEL PI INDICATOR 8 (GLOVE) ×1
GLOVE ECLIPSE 8.0 STRL XLNG CF (GLOVE) ×2 IMPLANT
GLOVE ECLIPSE 9.0 STRL (GLOVE) ×4 IMPLANT
GLOVE INDICATOR STER SZ 9 (GLOVE) ×2 IMPLANT
GOWN STRL REIN 3XL LVL4 (GOWN DISPOSABLE) ×2 IMPLANT
GOWN STRL REIN XL XLG (GOWN DISPOSABLE) ×4 IMPLANT
INST SET LAPROSCOPIC GYN AP (KITS) ×2 IMPLANT
INSTRUMENT SEW RIGHT (INSTRUMENTS) IMPLANT
IV NS IRRIG 3000ML ARTHROMATIC (IV SOLUTION) ×2 IMPLANT
KIT ROOM TURNOVER AP CYSTO (KITS) ×2 IMPLANT
MANIFOLD NEPTUNE II (INSTRUMENTS) ×2 IMPLANT
NEEDLE HYPO 18GX1.5 BLUNT FILL (NEEDLE) IMPLANT
NEEDLE INSUFFLATION 14GA 120MM (NEEDLE) ×2 IMPLANT
NS IRRIG 1000ML POUR BTL (IV SOLUTION) ×2 IMPLANT
PACK PERI GYN (CUSTOM PROCEDURE TRAY) ×2 IMPLANT
PAD ARMBOARD 7.5X6 YLW CONV (MISCELLANEOUS) ×2 IMPLANT
SCALPEL HARMONIC ACE (MISCELLANEOUS) ×2 IMPLANT
SCISSORS LAP 5X35 DISP (ENDOMECHANICALS) IMPLANT
SET BASIN LINEN APH (SET/KITS/TRAYS/PACK) ×2 IMPLANT
SET TUBE IRRIG SUCTION NO TIP (IRRIGATION / IRRIGATOR) ×2 IMPLANT
SOL PREP PROV IODINE SCRUB 4OZ (MISCELLANEOUS) ×2 IMPLANT
SOLUTION ANTI FOG 6CC (MISCELLANEOUS) ×2 IMPLANT
SPONGE GAUZE 4X4 12PLY (GAUZE/BANDAGES/DRESSINGS) ×2 IMPLANT
STRIP CLOSURE SKIN 1/4X3 (GAUZE/BANDAGES/DRESSINGS) ×4 IMPLANT
SUT CHROMIC 0 CT 1 (SUTURE) IMPLANT
SUT CHROMIC 2 0 CT 1 (SUTURE) IMPLANT
SUT VIC AB 2-0 CT2 27 (SUTURE) IMPLANT
SUT VIC AB 3-0 SH 27 (SUTURE) ×1
SUT VIC AB 3-0 SH 27X BRD (SUTURE) ×1 IMPLANT
SUT VICRYL 0 UR6 27IN ABS (SUTURE) ×2 IMPLANT
SYR CONTROL 10ML LL (SYRINGE) IMPLANT
SYRINGE 10CC LL (SYRINGE) ×2 IMPLANT
TOWEL OR 17X26 4PK STRL BLUE (TOWEL DISPOSABLE) IMPLANT
TRAY FOLEY CATH 14FR (SET/KITS/TRAYS/PACK) ×2 IMPLANT
TRAY FOLEY CATH 16FR SILVER (SET/KITS/TRAYS/PACK) IMPLANT
TUBING HI FLO HEAT INSUFFLATOR (IRRIGATION / IRRIGATOR) ×2 IMPLANT
TUBING INSUFFLATION HIGH FLOW (TUBING) IMPLANT
WARMER LAPAROSCOPE (MISCELLANEOUS) ×2 IMPLANT

## 2011-03-02 NOTE — H&P (View-Only) (Signed)
Jo Hale is an 50 y.o. female. Who is admitted for laparoscopic supracervical hysterectomy for uterine fibroids 12-14 week size Pap smear is most recently normal endometrial biopsy shows benign proliferative endometrium risks of procedure have been reviewed with patient including need for transfusion, conversion to open procedure or postoperative complications including bleeding infection or complications involving adjacent organs such as bowel ureters or bladder.  Ultrasound 02/08/2011 shows uterine enlargement to 13 x 10 x 8 cm with multiple small fundal fibroids ovaries are normal by ultrasound and no adnexal abnormalities are noted  Pertinent Gynecological History: Menses: regular every month without intermenstrual spotting Bleeding:  Contraception: tubal ligation DES exposure: unknown Blood transfusions: none Sexually transmitted diseases: no past history Previous GYN Procedures: Cesarean section x1 tubal ligation  Last mammogram: normal Date: 02/10/2010 Last pap: normal Date: 01/29/2010, 02/01/2011 OB History: G1, P1   Menstrual History: Menarche age:  Patient's last menstrual period was 02/17/2011.    Past Medical History  Diagnosis Date  . GERD (gastroesophageal reflux disease)   . Hypertension   . Fibroids, intramural     450-600 gm uterus    Past Surgical History  Procedure Date  . Cesarean section   . Tubal ligation   . Colonoscopy 02/19/2011    Procedure: COLONOSCOPY;  Surgeon: Arlyce Harman, MD;  Location: [facility];  Service: Endoscopy;  Laterality: N/A;  9:30 AM    Family History  Problem Relation Age of Onset  . Colon cancer Neg Hx     Social History:  reports that she has never smoked. She does not have any smokeless tobacco history on file. She reports that she does not drink alcohol or use illicit drugs.  Allergies:  Allergies  Allergen Reactions  . Adhesive (Tape) Rash     (Not in a hospital admission)  ROSReview of Systems -  Negative except mild deep thrust dyspareunia, single episode left lower quadrant pain 2 months ago lasting 2 days felt likely related to ovulation  now resolved Genito-Urinary ROS: no dysuria, trouble voiding, or hematuria positive for - dyspareunia and pelvic pain  Last menstrual period 02/17/2011. Physical ExamPhysical Examination: General appearance - alert, well appearing, and in no distress, oriented to person, place, and time, overweight and in good Gen. overall health Mouth - dental hygiene good and Oral airway good Chest - clear to auscultation, no wheezes, rales or rhonchi, symmetric air entry Heart - normal rate and regular rhythm Abdomen - soft, nontender, nondistended, no masses or organomegaly Obese lower abdomen well-healed cesarean section scar Pelvic - VULVA: normal appearing vulva with no masses, tenderness or lesions,  VAGINA: normal appearing vagina with normal color and discharge, no lesions, DNA probe for chlamydia and GC obtained, and negative  CERVIX: normal appearing cervix without discharge or lesions,  UTERUS: uterus is normal size, shape, consistency and nontender, retroverted, enlarged to 12 to 14 week's size, ADNEXA: normal adnexa in size, nontender and no masses, exam limited by body habitus, exam chaperoned by CP Extremities - Homan's sign negative bilaterally  No results found for this or any previous visit (from the past 24 hour(s)). CBC    Component Value Date/Time   WBC 3.8* 02/26/2011 1430   RBC 3.73* 02/26/2011 1430   HGB 11.9* 02/26/2011 1430   HCT 34.8* 02/26/2011 1430   PLT 281 02/26/2011 1430   MCV 93.3 02/26/2011 1430   MCH 31.9 02/26/2011 1430   MCHC 34.2 02/26/2011 1430   RDW 13.5 02/26/2011 1430   BMET  Component Value Date/Time   NA 138 02/26/2011 1430   K 3.7 02/26/2011 1430   CL 105 02/26/2011 1430   CO2 27 02/26/2011 1430   GLUCOSE 92 02/26/2011 1430   BUN 10 02/26/2011 1430   CREATININE 1.12* 02/26/2011 1430   CALCIUM 9.4  02/26/2011 1430   GFRNONAA 56* 02/26/2011 1430   GFRAA 65* 02/26/2011 1430    No results found.  Assessment/Plan:  uterine fibroids 12-14 week size with mild symptoms of pressure and dyspareunia, requesting surgical removal Risks of procedure rationale treatment options reviewed with patient likelihood of completing procedure abdominally listed at 90% with laparotomy possibly necessary and discussed with patient using [facility] patient instruction booklets, with preoperative bowel prep. Dr.Eure to assist.  Tilda Burrow 03/01/2011, 8:35 AM

## 2011-03-02 NOTE — Anesthesia Preprocedure Evaluation (Addendum)
Anesthesia Evaluation  Patient identified by MRN, date of birth, ID band Patient awake    Reviewed: Allergy & Precautions, H&P , NPO status , Patient's Chart, lab work & pertinent test results  History of Anesthesia Complications Negative for: history of anesthetic complications  Airway Mallampati: II      Dental  (+) Teeth Intact   Pulmonary neg pulmonary ROS,    + decreased breath sounds      Cardiovascular hypertension, Pt. on medications Regular Normal    Neuro/Psych    GI/Hepatic GERD-  Medicated and Controlled,  Endo/Other    Renal/GU      Musculoskeletal   Abdominal   Peds  Hematology   Anesthesia Other Findings   Reproductive/Obstetrics                           Anesthesia Physical Anesthesia Plan  ASA: II  Anesthesia Plan: General   Post-op Pain Management:    Induction: Intravenous, Rapid sequence and Cricoid pressure planned  Airway Management Planned: Oral ETT  Additional Equipment:   Intra-op Plan:   Post-operative Plan: Extubation in OR  Informed Consent: I have reviewed the patients History and Physical, chart, labs and discussed the procedure including the risks, benefits and alternatives for the proposed anesthesia with the patient or authorized representative who has indicated his/her understanding and acceptance.     Plan Discussed with:   Anesthesia Plan Comments:         Anesthesia Quick Evaluation

## 2011-03-02 NOTE — Progress Notes (Signed)
Ambulated in hallway without difficulty this afternoon.Will continue to monitor patient.

## 2011-03-02 NOTE — Anesthesia Procedure Notes (Signed)
Procedure Name: Intubation Date/Time: 03/02/2011 7:59 AM Performed by: Despina Hidden Pre-anesthesia Checklist: Patient identified, Patient being monitored, Emergency Drugs available and Suction available Patient Re-evaluated:Patient Re-evaluated prior to inductionOxygen Delivery Method: Circle System Utilized Preoxygenation: Pre-oxygenation with 100% oxygen Intubation Type: IV induction and Circoid Pressure applied Ventilation: Mask ventilation without difficulty Laryngoscope Size: 3 and Mac Grade View: Grade I Tube type: Oral Tube size: 7.0 mm Airway Equipment and Method: stylet Placement Confirmation: breath sounds checked- equal and bilateral and ETT inserted through vocal cords under direct vision Secured at: 22 cm Tube secured with: Tape Dental Injury: Teeth and Oropharynx as per pre-operative assessment

## 2011-03-02 NOTE — Anesthesia Postprocedure Evaluation (Signed)
  Anesthesia Post-op Note  Patient: Jo Hale  Procedure(s) Performed:  LAPAROSCOPIC SUPRACERVICAL HYSTERECTOMY  Patient Location: PACU  Anesthesia Type: General  Level of Consciousness: awake, alert , oriented and patient cooperative  Airway and Oxygen Therapy: Patient Spontanous Breathing  Post-op Pain: none  Post-op Assessment: Post-op Vital signs reviewed, Patient's Cardiovascular Status Stable, Respiratory Function Stable, Patent Airway and No signs of Nausea or vomiting  Post-op Vital Signs: Reviewed and stable  Complications: No apparent anesthesia complications

## 2011-03-02 NOTE — Brief Op Note (Signed)
03/02/2011  10:03 AM  PATIENT:  Jo Hale  50 y.o. female  PRE-OPERATIVE DIAGNOSIS:  symptomatic uterine fibroids, 12-14 week size;  dyspareunia enlarged uterus  POST-OPERATIVE DIAGNOSIS:  symptomatic uterine fibroids 12-14 week size;  dyspareunia enlarged uterus  PROCEDURE:  Procedure(s): LAPAROSCOPIC SUPRACERVICAL HYSTERECTOMY  SURGEON:  Surgeon(s): Tilda Burrow, MD Lazaro Arms, MD  PHYSICIAN ASSISTANT: Despina Hidden, MD  ASSISTANTS: Birdie Riddle, CST   ANESTHESIA:   general  EBL:  Total I/O In: 2000 [I.V.:2000] Out: 220 [Urine:100; Blood:120]  BLOOD ADMINISTERED:none  DRAINS: Urinary Catheter (Foley)   LOCAL MEDICATIONS USED:  NONE  SPECIMEN:  Source of Specimen:  uterus  DISPOSITION OF SPECIMEN:  PATHOLOGY  COUNTS:  YES  TOURNIQUET:  * No tourniquets in log *  DICTATION: .Dragon Dictation patient was taken to the operating room prepped and draped for combined abdominal and vaginal procedure with Foley catheter in place and uterine manipulator, Hulka tenaculum,attached to the cervix. Timeout had been conducted and IV Ancef 2 g administered preprocedure bowel prep was performed overnight  An infraumbilical vertical once centimeters skin incision was made as well as a transverse right lower quadrant and left lower quadrant 1 cm incision. Veress needle was used to achieve pneumoperitoneum. Laparoscopic trocar was inserted through the umbilicus and abdomen visualized with no evidence of trauma lower quadrant trochars were placed under direct visualization, 11 mm trocar on the left and 15 mm on the right. Attention was first directed to the right side. With the uterus rotated to the patient's left round ligament was taken down with harmonic scalpel utero-ovarian ligament taken down on the side was treated similarly. Bladder flap was developed anteriorly with harmonic scalpel. Uterine vessels were skeletonized on the patient's right side, then the left and clips applied  above and below the site of transection of the uterine vessels. Harmonic scalpel then used to transect uterine vessels on each side. There was only a small amount of bleeding on the right side, otherwise the surgery was very hemostatic. Lower uterine segment was then transected and harmonic scalpel with good hemostasis. Turgor specimen was then morcellated from the 15 mm right lower quadrant site using the 15 mm morcellator small tissue fragments were identified and extracted pelvis was irrigated. Pedicles inspected and confirmed as hemostatic and photos taken of the finished results.  Placed in the abdomen after removal of laparoscopic instruments was performed, leaving approximate 100 cc of saline in the abdomen. Fascial closure of the incisions was performed with 0 Vicryl single stitch in each site. Subcuticular 3-0 Vicryl close the skin incisions and patient recovery room in stable condition counts correct. Estimated blood loss 125 cc  PLAN OF CARE: Admit for overnight observation  PATIENT DISPOSITION:  PACU - hemodynamically stable.   Delay start of Pharmacological VTE agent (>24hrs) due to surgical blood loss or risk of bleeding:  {YES/NO/NOT APPLICABLE:20182

## 2011-03-02 NOTE — Interval H&P Note (Signed)
History and Physical Interval Note:   03/02/2011   7:28 AM   Jo Hale  has presented today for surgery, with the diagnosis of uterine fibroids dyspareunia enlarged uterus  The various methods of treatment have been discussed with the patient and family. After consideration of risks, benefits and other options for treatment, the patient has consented to  Procedure(s): LAPAROSCOPIC SUPRACERVICAL HYSTERECTOMY as a surgical intervention .  The patients' history has been reviewed, patient examined, no change in status, stable for surgery.  I have reviewed the patients' chart and labs.  Questions were answered to the patient's satisfaction.     Tilda Burrow  MD I have evaluated Jo Hale preoperatively, and have identified no interval change in the medical condition or plan of care since the history and physical of record. She took her bowel prep yesterday, with satisfactory results. She did not take her Morning meds for GERD and for HTN. Pt discussed this with staff upon arrival. Type and Cross in place

## 2011-03-02 NOTE — Transfer of Care (Signed)
Immediate Anesthesia Transfer of Care Note  Patient: Jo Hale  Procedure(s) Performed:  LAPAROSCOPIC SUPRACERVICAL HYSTERECTOMY  Patient Location: PACU  Anesthesia Type: General  Level of Consciousness: sedated  Airway & Oxygen Therapy: Patient Spontanous Breathing and Patient connected to face mask oxygen  Post-op Assessment: Report given to PACU RN, Post -op Vital signs reviewed and stable and Patient moving all extremities X 4  Post vital signs: Reviewed and stable  Complications: No apparent anesthesia complications

## 2011-03-02 NOTE — Brief Op Note (Signed)
03/02/2011  10:01 AM  PATIENT:  Jo Hale  50 y.o. female  PRE-OPERATIVE DIAGNOSIS:  symptomatic uterine fibroids, 12-14 week size;  dyspareunia enlarged uterus  POST-OPERATIVE DIAGNOSIS:  symptomatic uterine fibroids 12-14 week size;  dyspareunia enlarged uterus  PROCEDURE:  Procedure(s): LAPAROSCOPIC SUPRACERVICAL HYSTERECTOMY  SURGEON:  Surgeon(s): Tilda Burrow, MD Lazaro Arms, MD  PHYSICIAN ASSISTANT: Despina Hidden, MD  ASSISTANTS: Birdie Riddle, CST   ANESTHESIA:   general  EBL:  Total I/O In: 1000 [I.V.:1000] Out: 220 [Urine:100; Blood:120]  BLOOD ADMINISTERED:none  DRAINS: Urinary Catheter (Foley)   LOCAL MEDICATIONS USED:  NONE  SPECIMEN:  Source of Specimen:  uterus  DISPOSITION OF SPECIMEN:  PATHOLOGY  COUNTS:  YES  TOURNIQUET:  * No tourniquets in log *  DICTATION: .Dragon Dictation  PLAN OF CARE: Admit for overnight observation  PATIENT DISPOSITION:  PACU - hemodynamically stable.   Delay start of Pharmacological VTE agent (>24hrs) due to surgical blood loss or risk of bleeding:  {YES/NO/NOT APPLICABLE:20182

## 2011-03-03 ENCOUNTER — Encounter (HOSPITAL_COMMUNITY): Payer: Self-pay | Admitting: *Deleted

## 2011-03-03 LAB — CBC
Hemoglobin: 10.3 g/dL — ABNORMAL LOW (ref 12.0–15.0)
Platelets: 207 10*3/uL (ref 150–400)
RBC: 3.26 MIL/uL — ABNORMAL LOW (ref 3.87–5.11)
WBC: 5.1 10*3/uL (ref 4.0–10.5)

## 2011-03-03 MED ORDER — DOCUSATE SODIUM 100 MG PO CAPS: 100.0000 mg | ORAL_CAPSULE | Freq: Two times a day (BID) | ORAL | Status: AC | PRN

## 2011-03-03 MED ORDER — SODIUM CHLORIDE 0.9 % IJ SOLN
INTRAMUSCULAR | Status: AC
  Filled 2011-03-03: qty 3

## 2011-03-03 MED ORDER — IBUPROFEN 600 MG PO TABS: 600.0000 mg | ORAL_TABLET | Freq: Four times a day (QID) | ORAL | Status: AC | PRN

## 2011-03-03 MED ORDER — OXYCODONE-ACETAMINOPHEN 5-325 MG PO TABS: 1.0000 | ORAL_TABLET | ORAL | Status: AC | PRN

## 2011-03-03 NOTE — Addendum Note (Signed)
Addendum  created 03/03/11 1016 by Georgia Delsignore J Chett Taniguchi   Modules edited:Notes Section    

## 2011-03-03 NOTE — Progress Notes (Signed)
Discharge instructions and prescriptions given, verbalized understanding, out via w/c in stable condition with staff. 

## 2011-03-03 NOTE — Discharge Summary (Signed)
Physician Discharge Summary  Patient ID: Jo Hale MRN: [id number] DOB/AGE: 1960-05-31 50 y.o.  Admit date: 03/02/2011 Discharge date: 03/03/2011  Admission Diagnoses:  Discharge Diagnoses:  Active Problems:  * No active hospital problems. *    Discharged Condition: good  Hospital Course: Laparoscopic supracervical hysterectomy with overnight observation for pain management, stable for discharge in  AM.  Consults: none  Significant Diagnostic Studies: labs at discharge:  Hemoglobin & Hematocrit     Component Value Date/Time   HGB 10.3* 03/03/2011 0547   HCT 30.5* 03/03/2011 0547     Treatments: surgery: laparoscopic Supracervical Hysterectomy  Discharge Exam: Blood pressure 105/62, pulse 65, temperature 99.3 F (37.4 C), temperature source Oral, resp. rate 20, height 5\' 2"  (1.575 m), weight 97.5 kg (214 lb 15.2 oz), last menstrual period 02/14/2011, SpO2 97.00%. General appearance: alert, cooperative and no distress GI: soft, non-tender; bowel sounds normal; no masses,  no organomegaly and incision sites clean, no bruising Extremities: Homans sign is negative, no sign of DVT  Disposition: Home or Self Care  Discharge Orders    Future Orders Please Complete By Expires   Diet - low sodium heart healthy      Increase activity slowly      Discharge instructions      Comments:   General Gynecological Post-Operative Instructions You may expect to feel dizzy, weak, and drowsy for as long as 24 hours after receiving the medicine that made you sleep (anesthetic). The following information pertains to your recovery period for the first 24 hours following surgery.  Do not drive a car, ride a bicycle, participate in physical activities, or take public transportation until you are done taking narcotic pain medicines or as directed by your caregiver.  Do not drink alcohol or take tranquilizers.  Do not take medicine that has not been prescribed by your caregiver.  Do not  sign important papers or make important decisions while on narcotic pain medicines.  Have a responsible person with you.  CARE OF INCISION  Keep incision clean and dry. Take showers instead of baths until your caregiver gives you permission to take baths. Check with your caregiver if you have tubes coming from the wound site.  Avoid heavy lifting (more than 10 pounds/4.5 kilograms), pushing, or pulling.  Avoid activities that may risk injury to your surgical site.  Only take over-the-counter or prescription medicines for pain, discomfort, or fever as directed by your caregiver. Do not take aspirin. It can make you bleed. Take medicines (antibiotics) that kill germs as directed.  Call the office or go to the MAU if:  You feel sick to your stomach (nauseous).  You start to throw up (vomit).  You have trouble eating or drinking.  You have an oral temperature above 100.4.  You have constipation that is not helped by adjusting diet or increasing fluid intake. Pain medicines are a common cause of constipation.  SEEK IMMEDIATE MEDICAL CARE IF:  You have persistent dizziness.  You have difficulty breathing or a congested sounding (croupy) cough.  You have an oral temperature above 102.5, not controlled by medicine.  There is increasing pain or tenderness near or in the surgical site.  ExitCare Patient Information 2011 [place], [place]. General Gynecological Post-Operative Instructions You may expect to feel dizzy, weak, and drowsy for as long as 24 hours after receiving the medicine that made you sleep (anesthetic). The following information pertains to your recovery period for the first 24 hours following surgery.  Do not drive a  car, ride a bicycle, participate in physical activities, or take public transportation until you are done taking narcotic pain medicines or as directed by your caregiver.  Do not drink alcohol or take tranquilizers.  Do not take medicine that has not been prescribed by your  caregiver.  Do not sign important papers or make important decisions while on narcotic pain medicines.  Have a responsible person with you.  CARE OF INCISION  Keep incision clean and dry. Take showers instead of baths until your caregiver gives you permission to take baths. Check with your caregiver if you have tubes coming from the wound site.  Avoid heavy lifting (more than 10 pounds/4.5 kilograms), pushing, or pulling.  Avoid activities that may risk injury to your surgical site.  Only take over-the-counter or prescription medicines for pain, discomfort, or fever as directed by your caregiver. Do not take aspirin. It can make you bleed. Take medicines (antibiotics) that kill germs as directed.  Call the office or go to the MAU if:  You feel sick to your stomach (nauseous).  You start to throw up (vomit).  You have trouble eating or drinking.  You have an oral temperature above 100.4.  You have constipation that is not helped by adjusting diet or increasing fluid intake. Pain medicines are a common cause of constipation.  SEEK IMMEDIATE MEDICAL CARE IF:  You have persistent dizziness.  You have difficulty breathing or a congested sounding (croupy) cough.  You have an oral temperature above 102.5, not controlled by medicine.  There is increasing pain or tenderness near or in the surgical site.  ExitCare Patient Information 2011 [place], [place].   Driving Restrictions      Comments:   No driving x 1 week   Lifting restrictions      Comments:   No lifting above 15 pounds  X 4 weeks   Sexual Activity Restrictions      Comments:   No sex x 6 weeks   Call MD for:  persistant dizziness or light-headedness        Current Discharge Medication List    START taking these medications   Details  docusate sodium (COLACE) 100 MG capsule Take 1 capsule (100 mg total) by mouth 2 (two) times daily as needed for constipation. Qty: 30 capsule, Refills: 2    oxyCODONE-acetaminophen (PERCOCET)  5-325 MG per tablet Take 1 tablet by mouth every 4 (four) hours as needed for pain. Qty: 20 tablet, Refills: 0      CONTINUE these medications which have CHANGED   Details  ibuprofen (ADVIL,MOTRIN) 600 MG tablet Take 1 tablet (600 mg total) by mouth every 6 (six) hours as needed for pain. Qty: 30 tablet, Refills: 1      CONTINUE these medications which have NOT CHANGED   Details  ranitidine (ZANTAC) 300 MG capsule Take 300 mg by mouth every morning.      triamterene-hydrochlorothiazide (DYAZIDE) 37.5-25 MG per capsule Take 1 capsule by mouth every evening.          SignedTilda Burrow 03/03/2011, 7:06 AM

## 2011-03-03 NOTE — Progress Notes (Signed)
OIB .cm

## 2011-03-03 NOTE — Anesthesia Postprocedure Evaluation (Signed)
  Anesthesia Post-op Note  Patient: Jo Hale  Procedure(s) Performed:  LAPAROSCOPIC SUPRACERVICAL HYSTERECTOMY  Patient Location: room 329  Anesthesia Type: General  Level of Consciousness: awake, alert , oriented and patient cooperative  Airway and Oxygen Therapy: Patient Spontanous Breathing  Post-op Pain: none  Post-op Assessment: Post-op Vital signs reviewed, Patient's Cardiovascular Status Stable, Respiratory Function Stable, Patent Airway, No signs of Nausea or vomiting, Adequate PO intake and Pain level controlled  Post-op Vital Signs: Reviewed and stable  Complications: No apparent anesthesia complications

## 2011-03-09 ENCOUNTER — Encounter (HOSPITAL_COMMUNITY): Payer: Self-pay | Admitting: Obstetrics and Gynecology

## 2011-03-10 LAB — TYPE AND SCREEN
ABO/RH(D): B POS
Donor AG Type: NEGATIVE
Donor AG Type: NEGATIVE
Unit division: 0

## 2011-05-19 ENCOUNTER — Other Ambulatory Visit: Payer: Self-pay | Admitting: Family Medicine

## 2011-05-19 DIAGNOSIS — M25512 Pain in left shoulder: Secondary | ICD-10-CM

## 2011-05-24 ENCOUNTER — Ambulatory Visit (HOSPITAL_COMMUNITY): Admission: RE | Admit: 2011-05-24 | Payer: BC Managed Care – PPO | Source: Ambulatory Visit

## 2011-08-02 ENCOUNTER — Ambulatory Visit (HOSPITAL_COMMUNITY)
Admission: RE | Admit: 2011-08-02 | Discharge: 2011-08-02 | Disposition: A | Discharge status: Home / Self Care | Payer: BC Managed Care – PPO | Source: Ambulatory Visit | Attending: Orthopedic Surgery | Admitting: Orthopedic Surgery

## 2011-08-02 DIAGNOSIS — IMO0001 Reserved for inherently not codable concepts without codable children: Secondary | ICD-10-CM | POA: Insufficient documentation

## 2011-08-02 DIAGNOSIS — M25619 Stiffness of unspecified shoulder, not elsewhere classified: Secondary | ICD-10-CM | POA: Insufficient documentation

## 2011-08-02 DIAGNOSIS — M6281 Muscle weakness (generalized): Secondary | ICD-10-CM | POA: Insufficient documentation

## 2011-08-02 DIAGNOSIS — M25519 Pain in unspecified shoulder: Secondary | ICD-10-CM | POA: Insufficient documentation

## 2011-08-02 DIAGNOSIS — Z9889 Other specified postprocedural states: Secondary | ICD-10-CM | POA: Insufficient documentation

## 2011-08-02 NOTE — Evaluation (Signed)
Occupational Therapy Evaluation  Patient Details  Name: Jo Hale MRN: [id number] Date of Birth: April 25, 1960  Today's Date: 08/02/2011 Time: 8295-6213 Time Calculation (min): 38 min OT Evaluation 107-130 23' Therapeutic Exercises 130-145 15' Visit#: 1  of 36   Re-eval: 08/30/11  Assessment Diagnosis: S/P Left RCR  Surgical Date: 07/20/11 Next MD Visit: 08/17/11 Prior Therapy: none  Past Medical History:  Past Medical History  Diagnosis Date  . GERD (gastroesophageal reflux disease)   . Hypertension   . Fibroids, intramural     450-600 gm uterus   Past Surgical History:  Past Surgical History  Procedure Date  . Cesarean section   . Tubal ligation   . Colonoscopy 02/19/2011    Procedure: COLONOSCOPY;  Surgeon: Arlyce Harman, MD;  Location: [facility];  Service: Endoscopy;  Laterality: N/A;  9:30 AM  . Laparoscopic supracervical hysterectomy 03/02/2011    Procedure: LAPAROSCOPIC SUPRACERVICAL HYSTERECTOMY;  Surgeon: Tilda Burrow, MD;  Location: [facility] ORS;  Service: Gynecology;  Laterality: N/A;    Subjective Symptoms/Limitations Symptoms: S:  I hurt my shoulder in November at work.  I want to get back to normal without pain. 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  She injured her left shoulder while manipulating a large dumpster in November of 2012.  She thought that it might get better on its own, but her pain did not alleviate.  She consulted with Dr. Chaney Malling and had surgery on 07/20/11 to repair the torn left rotator cuff.  Per MD can be somewhat aggressive with PROM through 08/31/11.   Pain Assessment Currently in Pain?: Yes Pain Score:   7 Pain Location: Shoulder Pain Orientation: Left Pain Type: Acute pain  Precautions/Restrictions  Precautions Precautions: Shoulder (PROM only through 08/31/11)  Prior Functioning  Home Living Additional Comments: lives with her family and enjoys taking care of her  home. 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  not working currently due to surgery.  Assessment ADL/Vision/Perception ADL ADL Comments: Right handed.  Not using left hand with activites.  Can not don stockings or shoes.  Cognition/Observation    Sensation/Coordination/Edema    Additional Assessments LUE PROM (degrees) LUE Overall PROM Comments: assessed in supine.  Er and IR with shoulder adducted Left Shoulder Flexion  0-170: 60 Degrees Left Shoulder ABduction 0-40: 72 Degrees Left Shoulder Internal Rotation  0-70: 86 Degrees Left Shoulder External Rotation  0-90: 0 Degrees Palpation Palpation: 2" incision over superior shld.  Mod-max fascial restrictions.  Exercise/Treatments Supine Protraction: PROM;10 reps Horizontal ABduction: PROM;10 reps External Rotation: PROM;10 reps Internal Rotation: PROM;10 reps Flexion: PROM;10 reps ABduction: PROM;10 reps      Manual Therapy Manual Therapy: Myofascial release Myofascial Release: MFR and manual stretching to decrease pain and restrictions and increase pain free PROM in left shoulder to [facility].  130-145   Occupational Therapy Assessment and Plan OT Assessment and Plan Clinical Impression Statement: A:  Patient presents with decreased I with ADLs due to increased pain and restricitons and decreased ROM and strength S/P left RCR.   Rehab Potential: Excellent OT Frequency: Min 3X/week OT Duration: Other (comment) (12 weeks) OT Treatment/Interventions: Self-care/ADL training;Therapeutic exercise;Manual therapy;Modalities;Patient/family education;Therapeutic activities OT Plan: P: Skilled OT intervention to increase PROM and decrease pain and restrictions.  Treatment Plan:  MFR and manual stretching in supine.  bridging and isometric strengthening in supine.  Seated elev, ext, row.  therapy ball flex and abd.   Goals Short Term  Goals Time to Complete Short Term Goals: Other (comment) (6 weeks) Short Term  Goal 1: Patient will be educated on HEP. Short Term Goal 2: Patient will increase PROM to [facility] for increased ability to don stockings. Short Term Goal 3: Patient will increase left shoulder strength to 3+/5 for increased ability to reach and lift overhead. Short Term Goal 4: Patient will decrease pain to 4/10 during functional activities. Short Term Goal 5: Patient will decrease fascial restrictions to min-mod in her left shoulder region. Long Term Goals Time to Complete Long Term Goals: 12 weeks Long Term Goal 1: Patient will return to prior level of I with all B/IADLs, work, and leisure activities. Long Term Goal 2: Patient will increase AROM in left shoulder region to WNL for increased ability to reach overhead at work. Long Term Goal 3: Patient will increase left shoulder strength to 5/5 for increased I with lifting bins at work. Long Term Goal 4: Patient will decrease left shoulder pain to 2/10 when completing household cleaning tasks. Long Term Goal 5: Patient will decrease fascial restrictions to minimal - trace in her left shoulder region.  Problem List Patient Active Problem List  Diagnoses  . CHONDROMALACIA OF PATELLA  . POPLITEAL CYST, LEFT  . FOOT PAIN  . MEDIAL MENISCUS TEAR, LEFT  . Pain in joint, shoulder region  . Muscle weakness (generalized)  . Status post rotator cuff repair    End of Session Activity Tolerance: Patient tolerated treatment well General Behavior During Session: [facility] for tasks performed Cognition: [facility] for tasks performed OT Plan of Care OT Home Exercise Plan: Educated on pendulums, elbow, forearm, and wrist AROM, and towel slides.  Shirlean Mylar, OTR/L  08/02/2011, 3:42 PM  Physician Documentation Your signature is required to indicate approval of the treatment plan as stated above.  Please sign and either send electronically or make a copy of this report for your files and return this physician signed original.  Please mark one 1.__approve of  plan  2. ___approve of plan with the following conditions.   ______________________________                                                          _____________________ Physician Signature                                                                                                             Date

## 2011-08-03 ENCOUNTER — Ambulatory Visit (HOSPITAL_COMMUNITY)
Admission: RE | Admit: 2011-08-03 | Discharge: 2011-08-03 | Disposition: A | Discharge status: Home / Self Care | Payer: BC Managed Care – PPO | Source: Ambulatory Visit | Attending: Occupational Therapy | Admitting: Occupational Therapy

## 2011-08-03 DIAGNOSIS — M6281 Muscle weakness (generalized): Secondary | ICD-10-CM

## 2011-08-03 DIAGNOSIS — Z9889 Other specified postprocedural states: Secondary | ICD-10-CM

## 2011-08-03 DIAGNOSIS — M25519 Pain in unspecified shoulder: Secondary | ICD-10-CM

## 2011-08-03 NOTE — Progress Notes (Signed)
Occupational Therapy Treatment  Patient Details  Name: Jo Hale MRN: [id number] Date of Birth: 08/30/1960  Today's Date: 08/03/2011 Time: 4098-1191 Time Calculation (min): 33 min Manual Therapy 158-220 22' Therapeutic Exercise [phone] 10'  Visit#: 2  of 36   Re-eval: 08/30/11 Assessment Diagnosis: S/P Left RCR  Surgical Date: 07/20/11 Next MD Visit: 08/17/11  Subjective Symptoms/Limitations Symptoms: S:  It hurts when I lay down mostly Pain Assessment Currently in Pain?: Yes  Precautions/Restrictions  Precautions Precautions: Shoulder (PROM only through 5/21)  Exercise/Treatments Supine Protraction: PROM;10 reps Horizontal ABduction: PROM;10 reps External Rotation: PROM;10 reps Internal Rotation: PROM;10 reps Flexion: PROM;10 reps ABduction: PROM;10 reps Seated Elevation: AROM;10 reps Extension: AROM;10 reps Retraction: AROM;10 reps Row: AROM;10 reps Therapy Ball Flexion: 20 reps ABduction: 20 reps ROM / Strengthening / Isometric Strengthening   Flexion: 5X5" Extension: 5X5" External Rotation: 5X5" Internal Rotation: 5X5" ABduction: 5X5" ADduction: 5X5"       Manual Therapy Manual Therapy: Myofascial release Myofascial Release: MFR and manual stretching to decrease pain and restrictions and increase pain free PROM in left shoulder to [facility]  Occupational Therapy Assessment and Plan OT Assessment and Plan Clinical Impression Statement: A: Added multiple new exercises which patient tolerated well with no increse in pain. Rehab Potential: Excellent OT Plan: P:  Add bridging.   Goals Short Term Goals Time to Complete Short Term Goals: Other (comment) (6 weeks) Short Term Goal 1: Patient will be educated on HEP. Short Term Goal 2: Patient will increase PROM to [facility] for increased ability to don stockings. Short Term Goal 3: Patient will increase left shoulder strength to 3+/5 for increased ability to reach and lift overhead. Short Term Goal 4:  Patient will decrease pain to 4/10 during functional activities. Short Term Goal 5: Patient will decrease fascial restrictions to min-mod in her left shoulder region. Long Term Goals Time to Complete Long Term Goals: 12 weeks Long Term Goal 1: Patient will return to prior level of I with all B/IADLs, work, and leisure activities. Long Term Goal 2: Patient will increase AROM in left shoulder region to WNL for increased ability to reach overhead at work. Long Term Goal 3: Patient will increase left shoulder strength to 5/5 for increased I with lifting bins at work. Long Term Goal 4: Patient will decrease left shoulder pain to 2/10 when completing household cleaning tasks. Long Term Goal 5: Patient will decrease fascial restrictions to minimal - trace in her left shoulder region.  Problem List Patient Active Problem List  Diagnoses  . CHONDROMALACIA OF PATELLA  . POPLITEAL CYST, LEFT  . FOOT PAIN  . MEDIAL MENISCUS TEAR, LEFT  . Pain in joint, shoulder region  . Muscle weakness (generalized)  . Status post rotator cuff repair    End of Session Activity Tolerance: Patient tolerated treatment well General Behavior During Session: [facility] for tasks performed Cognition: [facility] for tasks performed  GO No functional reporting required   Kieli Golladay L. Noralee Stain, COTA/L  08/03/2011, 2:36 PM

## 2011-08-05 ENCOUNTER — Ambulatory Visit (HOSPITAL_COMMUNITY)
Admission: RE | Admit: 2011-08-05 | Discharge: 2011-08-05 | Disposition: A | Discharge status: Home / Self Care | Payer: BC Managed Care – PPO | Source: Ambulatory Visit | Attending: Orthopedic Surgery | Admitting: Orthopedic Surgery

## 2011-08-05 DIAGNOSIS — M6281 Muscle weakness (generalized): Secondary | ICD-10-CM

## 2011-08-05 DIAGNOSIS — M25519 Pain in unspecified shoulder: Secondary | ICD-10-CM

## 2011-08-05 DIAGNOSIS — Z9889 Other specified postprocedural states: Secondary | ICD-10-CM

## 2011-08-05 NOTE — Progress Notes (Signed)
Occupational Therapy Treatment  Patient Details  Name: Jo Hale MRN: [id number] Date of Birth: [birth year]  Today's Date: 08/05/2011 Time: 4098-1191 Time Calculation (min): 45 min Manual Therapy [phone] 33' Therapeutic Exercises [phone] 12' Visit#: 3  of 36   Re-eval: 08/30/11    Subjective Symptoms/Limitations Symptoms: S:  I havent done the towel slides Pain Assessment Currently in Pain?: Yes Pain Score:   6 Pain Location: Shoulder Pain Orientation: Left Pain Type: Acute pain  Precautions/Restrictions     Exercise/Treatments Supine Protraction: PROM;10 reps Horizontal ABduction: PROM;10 reps External Rotation: PROM;10 reps Internal Rotation: PROM;10 reps Flexion: PROM;10 reps ABduction: PROM;10 reps Other Supine Exercises: bridging x 20 Seated Elevation: AROM;12 reps Extension: AROM;12 reps Row: AROM;12 reps Therapy Ball Flexion: 20 reps ABduction: 20 reps ROM / Strengthening / Isometric Strengthening   Flexion: 5X5" Extension: 5X5" External Rotation: 5X5" Internal Rotation: 5X5" ABduction: 5X5" ADduction: 5X5"    Manual Therapy Manual Therapy: Myofascial release Myofascial Release: MFR and manual stretching to decrease pain and restrictions in left upper arm, scapular, and shoulder region to decrease pain and increase PROM [phone]  Occupational Therapy Assessment and Plan OT Assessment and Plan Clinical Impression Statement: A:  Patient guarded with therapy ball stretch. OT Plan: P:  Follow up on compliance with towel slides at home.   Goals Short Term Goals Time to Complete Short Term Goals: Other (comment) (6 weeks) Short Term Goal 1: Patient will be educated on HEP. Short Term Goal 2: Patient will increase PROM to [facility] for increased ability to don stockings. Short Term Goal 3: Patient will increase left shoulder strength to 3+/5 for increased ability to reach and lift overhead. Short Term Goal 4: Patient will decrease pain to 4/10  during functional activities. Short Term Goal 5: Patient will decrease fascial restrictions to min-mod in her left shoulder region. Long Term Goals Time to Complete Long Term Goals: 12 weeks Long Term Goal 1: Patient will return to prior level of I with all B/IADLs, work, and leisure activities. Long Term Goal 2: Patient will increase AROM in left shoulder region to WNL for increased ability to reach overhead at work. Long Term Goal 3: Patient will increase left shoulder strength to 5/5 for increased I with lifting bins at work. Long Term Goal 4: Patient will decrease left shoulder pain to 2/10 when completing household cleaning tasks. Long Term Goal 5: Patient will decrease fascial restrictions to minimal - trace in her left shoulder region.  Problem List Patient Active Problem List  Diagnoses  . CHONDROMALACIA OF PATELLA  . POPLITEAL CYST, LEFT  . FOOT PAIN  . MEDIAL MENISCUS TEAR, LEFT  . Pain in joint, shoulder region  . Muscle weakness (generalized)  . Status post rotator cuff repair    End of Session Activity Tolerance: Patient tolerated treatment well General Behavior During Session: [facility] for tasks performed Cognition: [facility] for tasks performed  GO No functional reporting required  Shirlean Mylar, OTR/L  08/05/2011, 4:10 PM

## 2011-08-09 ENCOUNTER — Ambulatory Visit (HOSPITAL_COMMUNITY)
Admission: RE | Admit: 2011-08-09 | Discharge: 2011-08-09 | Disposition: A | Discharge status: Home / Self Care | Payer: BC Managed Care – PPO | Source: Ambulatory Visit | Attending: Family Medicine | Admitting: Family Medicine

## 2011-08-09 DIAGNOSIS — M25519 Pain in unspecified shoulder: Secondary | ICD-10-CM

## 2011-08-09 DIAGNOSIS — M6281 Muscle weakness (generalized): Secondary | ICD-10-CM

## 2011-08-09 DIAGNOSIS — Z9889 Other specified postprocedural states: Secondary | ICD-10-CM

## 2011-08-09 NOTE — Progress Notes (Signed)
Occupational Therapy Treatment  Patient Details  Name: Jo Hale MRN: [id number] Date of Birth: Aug 12, 1960  Today's Date: 08/09/2011 Time: 0865-7846 Time Calculation (min): 44 min Manual Therapy 240-309 29' Therapeutic Exercise 310-324 14'  Visit#: 4  of 36   Re-eval: 08/30/11 Assessment Diagnosis: S/P Left RCR  Surgical Date: 07/20/11 Next MD Visit: 08/17/11  Subjective Symptoms/Limitations Symptoms: S:  It felt swollen yesterday so I didn't do much. Pain Assessment Currently in Pain?: Yes Pain Score:   4 Pain Location: Shoulder Pain Orientation: Left  Precautions/Restrictions  Precautions Precautions: Shoulder Type of Shoulder Precautions: PROM only through 5/21  Exercise/Treatments Supine Protraction: PROM;10 reps Horizontal ABduction: PROM;10 reps External Rotation: PROM;10 reps Internal Rotation: PROM;10 reps Flexion: PROM;10 reps ABduction: PROM;10 reps Other Supine Exercises: bridging x 20 Seated Elevation: AROM;12 reps Extension: AROM;12 reps Retraction: AROM;12 reps Row: AROM;12 reps Therapy Ball Flexion: 20 reps ABduction: 20 reps ROM / Strengthening / Isometric Strengthening   Flexion: 5X5" Extension: 5X5" External Rotation: 5X5" Internal Rotation: 5X5" ABduction: 5X5" ADduction: 5X5"     Manual Therapy Manual Therapy: Myofascial release Myofascial Release: MFR and manual stretching to decrease pain and restrictions in left upper arm, scapular, and shoulder region to decrease pain and increase  Occupational Therapy Assessment and Plan OT Assessment and Plan Clinical Impression Statement: A:  Stressed the need to complete HEP daily to help decrease stiffness and increase A/PROM and prevent joint adhesions. Rehab Potential: Excellent OT Plan: P:  Cont with plan per protocol.   Goals Short Term Goals Time to Complete Short Term Goals: Other (comment) (6 weeks) Short Term Goal 1: Patient will be educated on HEP. Short Term Goal  2: Patient will increase PROM to [facility] for increased ability to don stockings. Short Term Goal 3: Patient will increase left shoulder strength to 3+/5 for increased ability to reach and lift overhead. Short Term Goal 4: Patient will decrease pain to 4/10 during functional activities. Short Term Goal 5: Patient will decrease fascial restrictions to min-mod in her left shoulder region. Long Term Goals Time to Complete Long Term Goals: 12 weeks Long Term Goal 1: Patient will return to prior level of I with all B/IADLs, work, and leisure activities. Long Term Goal 2: Patient will increase AROM in left shoulder region to WNL for increased ability to reach overhead at work. Long Term Goal 3: Patient will increase left shoulder strength to 5/5 for increased I with lifting bins at work. Long Term Goal 4: Patient will decrease left shoulder pain to 2/10 when completing household cleaning tasks. Long Term Goal 5: Patient will decrease fascial restrictions to minimal - trace in her left shoulder region.  Problem List Patient Active Problem List  Diagnoses  . CHONDROMALACIA OF PATELLA  . POPLITEAL CYST, LEFT  . FOOT PAIN  . MEDIAL MENISCUS TEAR, LEFT  . Pain in joint, shoulder region  . Muscle weakness (generalized)  . Status post rotator cuff repair    End of Session Activity Tolerance: Patient tolerated treatment well General Behavior During Session: [facility] for tasks performed Cognition: [facility] for tasks performed  GO No functional reporting required   Olanda Downie L. Noralee Stain, COTA/L  08/09/2011, 4:38 PM

## 2011-08-10 ENCOUNTER — Ambulatory Visit (HOSPITAL_COMMUNITY)
Admission: RE | Admit: 2011-08-10 | Discharge: 2011-08-10 | Disposition: A | Discharge status: Home / Self Care | Payer: BC Managed Care – PPO | Source: Ambulatory Visit | Attending: Orthopedic Surgery | Admitting: Orthopedic Surgery

## 2011-08-10 DIAGNOSIS — M25519 Pain in unspecified shoulder: Secondary | ICD-10-CM

## 2011-08-10 DIAGNOSIS — M6281 Muscle weakness (generalized): Secondary | ICD-10-CM

## 2011-08-10 DIAGNOSIS — Z9889 Other specified postprocedural states: Secondary | ICD-10-CM

## 2011-08-10 NOTE — Progress Notes (Signed)
Occupational Therapy Treatment Patient Details  Name: Jo Hale MRN: [id number] Date of Birth: [birth year]  Today's Date: 08/10/2011 Time: 6962-9528 OT Time Calculation (min): 39 min Manual Therapy 103-131 28' Therapeutic Exercise 132-142 10'  Visit#: 6  of 36   Re-eval: 08/30/11 Assessment Diagnosis: S/P Left RCR  Surgical Date: 07/20/11 Next MD Visit: 08/17/11   Subjective Symptoms/Limitations Symptoms: S:  It feels stiff and swollen.  It is so tight. Pain Assessment Currently in Pain?: Yes Pain Score:   4 Pain Orientation: Left Pain Type: Acute pain  Precautions/Restrictions  Precautions Precautions: Shoulder Type of Shoulder Precautions: PROM only through 5/21  Exercise/Treatments Supine Protraction: PROM;10 reps Horizontal ABduction: PROM;10 reps External Rotation: PROM;10 reps Internal Rotation: PROM;10 reps Flexion: PROM;10 reps ABduction: PROM;10 reps Seated Elevation: AROM;12 reps Extension: AROM;12 reps Retraction: AROM;12 reps Row: AROM;12 reps Therapy Ball Flexion: 20 reps ABduction: 20 reps ROM / Strengthening / Isometric Strengthening   Flexion: 5X5" Extension: 5X5" External Rotation: 5X5" Internal Rotation: 5X5" ABduction: 5X5" ADduction: 5X5"     Manual Therapy Manual Therapy: Myofascial release Myofascial Release: MFR and manual stretching to decrease pain and restrictions in left upper arm, scapular, and shoulder region to decrease pain and increase   Occupational Therapy Assessment and Plan OT Assessment and Plan Clinical Impression Statement: A:  Tolerated increase in PROM Rehab Potential: Excellent OT Plan: P: Cont. per protocol.   Goals Short Term Goals Time to Complete Short Term Goals: Other (comment) (6 weeks) Short Term Goal 1: Patient will be educated on HEP. Short Term Goal 2: Patient will increase PROM to [facility] for increased ability to don stockings. Short Term Goal 3: Patient will increase left shoulder  strength to 3+/5 for increased ability to reach and lift overhead. Short Term Goal 4: Patient will decrease pain to 4/10 during functional activities. Short Term Goal 5: Patient will decrease fascial restrictions to min-mod in her left shoulder region. Long Term Goals Time to Complete Long Term Goals: 12 weeks Long Term Goal 1: Patient will return to prior level of I with all B/IADLs, work, and leisure activities. Long Term Goal 2: Patient will increase AROM in left shoulder region to WNL for increased ability to reach overhead at work. Long Term Goal 3: Patient will increase left shoulder strength to 5/5 for increased I with lifting bins at work. Long Term Goal 4: Patient will decrease left shoulder pain to 2/10 when completing household cleaning tasks. Long Term Goal 5: Patient will decrease fascial restrictions to minimal - trace in her left shoulder region.  Problem List Patient Active Problem List  Diagnoses  . CHONDROMALACIA OF PATELLA  . POPLITEAL CYST, LEFT  . FOOT PAIN  . MEDIAL MENISCUS TEAR, LEFT  . Pain in joint, shoulder region  . Muscle weakness (generalized)  . Status post rotator cuff repair    End of Session Activity Tolerance: Patient tolerated treatment well General Behavior During Session: [facility] for tasks performed Cognition: [facility] for tasks performed  GO No functional reporting required   Jo Hale L. Teresa Nicodemus, COTA/L  08/10/2011, 2:00 PM

## 2011-08-12 ENCOUNTER — Ambulatory Visit (HOSPITAL_COMMUNITY)
Admission: RE | Admit: 2011-08-12 | Discharge: 2011-08-12 | Disposition: A | Discharge status: Home / Self Care | Payer: BC Managed Care – PPO | Source: Ambulatory Visit | Attending: Family Medicine | Admitting: Family Medicine

## 2011-08-12 DIAGNOSIS — IMO0001 Reserved for inherently not codable concepts without codable children: Secondary | ICD-10-CM | POA: Insufficient documentation

## 2011-08-12 DIAGNOSIS — M25619 Stiffness of unspecified shoulder, not elsewhere classified: Secondary | ICD-10-CM | POA: Insufficient documentation

## 2011-08-12 DIAGNOSIS — M25519 Pain in unspecified shoulder: Secondary | ICD-10-CM | POA: Insufficient documentation

## 2011-08-12 DIAGNOSIS — M6281 Muscle weakness (generalized): Secondary | ICD-10-CM | POA: Insufficient documentation

## 2011-08-12 DIAGNOSIS — Z9889 Other specified postprocedural states: Secondary | ICD-10-CM

## 2011-08-12 NOTE — Progress Notes (Signed)
Occupational Therapy Treatment Patient Details  Name: SYRINA WAKE MRN: [id number] Date of Birth: Sep 12, 1960  Today's Date: 08/12/2011 Time: 4098-1191 OT Time Calculation (min): 36 min Manual Therapy 4782-9562 24' Therapeutic Exercises 1308-6578 12' Visit#: 7  of 36   Re-eval: 08/30/11    Subjective Symptoms/Limitations Symptoms: S:  My shoulder is feeling a little bit better. Pain Assessment Currently in Pain?: Yes Pain Score:   2 Pain Location: Shoulder Pain Orientation: Left Pain Type: Acute pain  Precautions/Restrictions   PROM x 6 weeks  Exercise/Treatments Supine Protraction: PROM;10 reps Horizontal ABduction: PROM;10 reps External Rotation: PROM;10 reps Internal Rotation: PROM;10 reps Flexion: PROM;10 reps ABduction: PROM;10 reps Other Supine Exercises: bridging x 20 Seated Elevation: AROM;15 reps Extension: AROM;15 reps Row: AROM;15 reps Therapy Ball Flexion: 20 reps ABduction: 20 reps    Manual Therapy Manual Therapy: Myofascial release Myofascial Release: MFR and manual stretching to left upper arm, scapular region, and shoulder region to decrease pain and restrictions and increase mobility.  [id number]  Occupational Therapy Assessment and Plan OT Assessment and Plan Clinical Impression Statement: A:  PROM continuing to improve. OT Plan: P: Increase PROM to full range and begin scar release on posterior scar.   Goals Short Term Goals Time to Complete Short Term Goals: Other (comment) (6 weeks) Short Term Goal 1: Patient will be educated on HEP. Short Term Goal 1 Progress: Progressing toward goal Short Term Goal 2: Patient will increase PROM to [facility] for increased ability to don stockings. Short Term Goal 2 Progress: Progressing toward goal Short Term Goal 3: Patient will increase left shoulder strength to 3+/5 for increased ability to reach and lift overhead. Short Term Goal 3 Progress: Progressing toward goal Short Term Goal 4: Patient will  decrease pain to 4/10 during functional activities. Short Term Goal 4 Progress: Progressing toward goal Short Term Goal 5: Patient will decrease fascial restrictions to min-mod in her left shoulder region. Short Term Goal 5 Progress: Progressing toward goal Long Term Goals Time to Complete Long Term Goals: 12 weeks Long Term Goal 1: Patient will return to prior level of I with all B/IADLs, work, and leisure activities. Long Term Goal 1 Progress: Progressing toward goal Long Term Goal 2: Patient will increase AROM in left shoulder region to WNL for increased ability to reach overhead at work. Long Term Goal 2 Progress: Progressing toward goal Long Term Goal 3: Patient will increase left shoulder strength to 5/5 for increased I with lifting bins at work. Long Term Goal 3 Progress: Progressing toward goal Long Term Goal 4: Patient will decrease left shoulder pain to 2/10 when completing household cleaning tasks. Long Term Goal 4 Progress: Progressing toward goal Long Term Goal 5: Patient will decrease fascial restrictions to minimal - trace in her left shoulder region. Long Term Goal 5 Progress: Progressing toward goal  Problem List Patient Active Problem List  Diagnoses  . CHONDROMALACIA OF PATELLA  . POPLITEAL CYST, LEFT  . FOOT PAIN  . MEDIAL MENISCUS TEAR, LEFT  . Pain in joint, shoulder region  . Muscle weakness (generalized)  . Status post rotator cuff repair    End of Session Activity Tolerance: Patient tolerated treatment well General Behavior During Session: [facility] for tasks performed Cognition: [facility] for tasks performed  GO No functional reporting required  Shirlean Mylar, OTR/L  08/12/2011, 1:47 PM

## 2011-08-16 ENCOUNTER — Ambulatory Visit (HOSPITAL_COMMUNITY)
Admission: RE | Admit: 2011-08-16 | Discharge: 2011-08-16 | Disposition: A | Discharge status: Home / Self Care | Payer: BC Managed Care – PPO | Source: Ambulatory Visit | Attending: Family Medicine | Admitting: Family Medicine

## 2011-08-16 ENCOUNTER — Telehealth (HOSPITAL_COMMUNITY): Payer: Self-pay | Admitting: Occupational Therapy

## 2011-08-16 DIAGNOSIS — M25519 Pain in unspecified shoulder: Secondary | ICD-10-CM

## 2011-08-16 DIAGNOSIS — M6281 Muscle weakness (generalized): Secondary | ICD-10-CM

## 2011-08-16 DIAGNOSIS — Z9889 Other specified postprocedural states: Secondary | ICD-10-CM

## 2011-08-16 NOTE — Progress Notes (Signed)
Occupational Therapy Treatment Patient Details  Name: Jo Hale MRN: [id number] Date of Birth: July 12, 1960  Today's Date: 08/16/2011 Time: 4098-1191 OT Time Calculation (min): 43 min Manual Therapy 4782-9562 25' Therapeutic Exercises [id number] 18' Visit#: 8  of 16   Re-eval: 08/30/11    Subjective Symptoms/Limitations Symptoms: S:  Why does it hurt when I try to lift it? Limitations: Discussed the pathology, anatomy, etc of shoulder joint and rotator cuff, also course of rehab and the dangers of AROM prior to 6 weeks post op.  Pain Assessment Currently in Pain?: Yes Pain Score:   2 Pain Location: Shoulder Pain Orientation: Left Pain Type: Acute pain  Precautions/Restrictions   PROM x 6 weeks  Exercise/Treatments Supine Protraction: PROM;10 reps Horizontal ABduction: PROM;10 reps External Rotation: PROM;10 reps Internal Rotation: PROM;10 reps Flexion: PROM;10 reps ABduction: PROM;10 reps Other Supine Exercises: bridging x 20 Seated Elevation: AROM;15 reps Extension: AROM;15 reps Row: AROM;15 reps Therapy Ball Flexion: 20 reps ABduction: 20 reps  Isometric Strengthening   Flexion: 5X5" Extension: 5X5" External Rotation: 5X5" Internal Rotation: 5X5" ABduction: 5X5" ADduction: 5X5"    Manual Therapy Manual Therapy: Myofascial release Myofascial Release: MFR and manual stretching to left upper arm, scapular, and shoulder region to decrease pain and restrictions and increase mobility.  [id number]  Occupational Therapy Assessment and Plan OT Assessment and Plan Clinical Impression Statement: A:  Patient much more relaxed and less resistant to PROM this date.  Appears to be much more comfortable with body movements as related to her LUE. OT Plan: P:  Increase PROM to full range.  Add anterior, inferior, and caudal glide.   Goals Short Term Goals Time to Complete Short Term Goals: Other (comment) (6 weeks) Short Term Goal 1: Patient will be educated on  HEP. Short Term Goal 2: Patient will increase PROM to [facility] for increased ability to don stockings. Short Term Goal 3: Patient will increase left shoulder strength to 3+/5 for increased ability to reach and lift overhead. Short Term Goal 4: Patient will decrease pain to 4/10 during functional activities. Short Term Goal 5: Patient will decrease fascial restrictions to min-mod in her left shoulder region. Long Term Goals Time to Complete Long Term Goals: 12 weeks Long Term Goal 1: Patient will return to prior level of I with all B/IADLs, work, and leisure activities. Long Term Goal 2: Patient will increase AROM in left shoulder region to WNL for increased ability to reach overhead at work. Long Term Goal 3: Patient will increase left shoulder strength to 5/5 for increased I with lifting bins at work. Long Term Goal 4: Patient will decrease left shoulder pain to 2/10 when completing household cleaning tasks. Long Term Goal 5: Patient will decrease fascial restrictions to minimal - trace in her left shoulder region.  Problem List Patient Active Problem List  Diagnoses  . CHONDROMALACIA OF PATELLA  . POPLITEAL CYST, LEFT  . FOOT PAIN  . MEDIAL MENISCUS TEAR, LEFT  . Pain in joint, shoulder region  . Muscle weakness (generalized)  . Status post rotator cuff repair    End of Session Activity Tolerance: Patient tolerated treatment well General Behavior During Session: [facility] for tasks performed Cognition: [facility] for tasks performed  GO No functional reporting required  Shirlean Mylar, OTR/L  08/16/2011, 4:14 PM

## 2011-08-17 ENCOUNTER — Ambulatory Visit (HOSPITAL_COMMUNITY): Payer: BC Managed Care – PPO | Admitting: Occupational Therapy

## 2011-08-17 ENCOUNTER — Ambulatory Visit (HOSPITAL_COMMUNITY)
Admission: RE | Admit: 2011-08-17 | Discharge: 2011-08-17 | Disposition: A | Discharge status: Home / Self Care | Payer: BC Managed Care – PPO | Source: Ambulatory Visit | Attending: Occupational Therapy | Admitting: Occupational Therapy

## 2011-08-17 DIAGNOSIS — M25519 Pain in unspecified shoulder: Secondary | ICD-10-CM

## 2011-08-17 DIAGNOSIS — Z9889 Other specified postprocedural states: Secondary | ICD-10-CM

## 2011-08-17 DIAGNOSIS — M6281 Muscle weakness (generalized): Secondary | ICD-10-CM

## 2011-08-17 NOTE — Progress Notes (Signed)
Occupational Therapy Treatment Patient Details  Name: Jo Hale MRN: [id number] Date of Birth: 05/12/1960  Today's Date: 08/17/2011 Time: 6295-2841 OT Time Calculation (min): 44 min Manual Therapy [phone] 22' Therapeutic Exercise [phone] 21'  Visit#: 9  of 16   Re-eval: 09/14/11    Subjective Pain Assessment Pain Score: 0-No pain (at rest.)  Precautions/Restrictions     Exercise/Treatments Supine Protraction: PROM;10 reps Horizontal ABduction: PROM;10 reps External Rotation: PROM;10 reps Internal Rotation: PROM;10 reps Flexion: PROM;10 reps ABduction: PROM;10 reps Flexion: 5X5" Extension: 5X5" External Rotation: 5X5" Internal Rotation: 5X5" ABduction: 5X5" ADduction: 5X5"     Manual Therapy Manual Therapy: Myofascial release Myofascial Release: MFR and manual stretching to left upper arm, scapular, and shoulder region to decrease pain and restrictions and increase mobility  Occupational Therapy Assessment and Plan OT Assessment and Plan Clinical Impression Statement: A:  Added anterior glide, caudal glide and inferior glide.  See progress note OT Plan: P:  Cont. 3x a week for 4 weeks   Goals Short Term Goals Time to Complete Short Term Goals: Other (comment) (6 weeks) Short Term Goal 1: Patient will be educated on HEP. Short Term Goal 1 Progress: Met Short Term Goal 2: Patient will increase PROM to [facility] for increased ability to don stockings. Short Term Goal 2 Progress: Progressing toward goal Short Term Goal 3: Patient will increase left shoulder strength to 3+/5 for increased ability to reach and lift overhead. Short Term Goal 3 Progress: Progressing toward goal Short Term Goal 4: Patient will decrease pain to 4/10 during functional activities. Short Term Goal 4 Progress: Met Short Term Goal 5: Patient will decrease fascial restrictions to min-mod in her left shoulder region. Short Term Goal 5 Progress: Met Long Term Goals Time to Complete Long Term  Goals: 12 weeks Long Term Goal 1: Patient will return to prior level of I with all B/IADLs, work, and leisure activities. Long Term Goal 1 Progress: Progressing toward goal Long Term Goal 2: Patient will increase AROM in left shoulder region to WNL for increased ability to reach overhead at work. Long Term Goal 2 Progress: Progressing toward goal Long Term Goal 3: Patient will increase left shoulder strength to 5/5 for increased I with lifting bins at work. Long Term Goal 3 Progress: Progressing toward goal Long Term Goal 4: Patient will decrease left shoulder pain to 2/10 when completing household cleaning tasks. Long Term Goal 4 Progress: Progressing toward goal Long Term Goal 5: Patient will decrease fascial restrictions to minimal - trace in her left shoulder region. Long Term Goal 5 Progress: Progressing toward goal  Problem List Patient Active Problem List  Diagnoses  . CHONDROMALACIA OF PATELLA  . POPLITEAL CYST, LEFT  . FOOT PAIN  . MEDIAL MENISCUS TEAR, LEFT  . Pain in joint, shoulder region  . Muscle weakness (generalized)  . Status post rotator cuff repair    End of Session Activity Tolerance: Patient tolerated treatment well General Behavior During Session: [facility] for tasks performed Cognition: [facility] for tasks performed  GO No functional reporting required  Kaitlin Ardito L. Adilynn Bessey, COTA/L  08/17/2011, 10:23 AM

## 2011-08-18 ENCOUNTER — Ambulatory Visit (HOSPITAL_COMMUNITY): Payer: BC Managed Care – PPO | Admitting: Occupational Therapy

## 2011-08-19 ENCOUNTER — Ambulatory Visit (HOSPITAL_COMMUNITY)
Admission: RE | Admit: 2011-08-19 | Discharge: 2011-08-19 | Disposition: A | Discharge status: Home / Self Care | Payer: BC Managed Care – PPO | Source: Ambulatory Visit | Attending: Family Medicine | Admitting: Family Medicine

## 2011-08-19 DIAGNOSIS — M25519 Pain in unspecified shoulder: Secondary | ICD-10-CM

## 2011-08-19 DIAGNOSIS — Z9889 Other specified postprocedural states: Secondary | ICD-10-CM

## 2011-08-19 DIAGNOSIS — M6281 Muscle weakness (generalized): Secondary | ICD-10-CM

## 2011-08-19 NOTE — Progress Notes (Signed)
Occupational Therapy Treatment Patient Details  Name: Jo Hale MRN: [id number] Date of Birth: 1961/01/06  Today's Date: 08/19/2011 Time: 4098-1191 OT Time Calculation (min): 47 min Manual Therapy [phone] 22' Therapeutic Exercises [phone] 25' Visit#: 10  of 16   Re-eval: 09/14/11    Subjective Symptoms/Limitations Symptoms: S:  He said I can do more and that he wants me to lift it over my head. Pain Assessment Currently in Pain?: No/denies  Precautions/Restrictions   AAROM thru 08/31/11  O:  Exercise/Treatments Supine Protraction: PROM;10 reps;AAROM;5 reps Horizontal ABduction: PROM;10 reps;AAROM;5 reps External Rotation: PROM;10 reps;AAROM;5 reps Internal Rotation: PROM;10 reps;AAROM;5 reps Flexion: PROM;10 reps;AAROM;5 reps ABduction: PROM;10 reps;AAROM;5 reps Other Supine Exercises: dc Seated Elevation: AROM;15 reps Extension: AROM;15 reps Row: AROM;15 reps Pulleys Flexion: 2 minutes ABduction: 2 minutes Therapy Ball Flexion: 20 reps ABduction: 20 reps ROM / Strengthening / Isometric Strengthening Anterior Glide: dc Caudal Glide: dc Other ROM/Strengthening Exercises: dc      Manual Therapy Manual Therapy: Myofascial release Myofascial Release: MFR and manual stretching to left upper arm, scapular, and shoulder region to decrease pain and restrictions and increase mobility.  Added scar release.  [phone]  Occupational Therapy Assessment and Plan OT Assessment and Plan Clinical Impression Statement: A:  Per MD, began AAROM in supine and added pulleys.  Continue AAROM only until 6 weeks post op (08/31/11) OT Plan: P:  Increase to 10 reps of dowel rod exercises.   Goals Short Term Goals Time to Complete Short Term Goals: Other (comment) (6 weeks) Short Term Goal 1: Patient will be educated on HEP. Short Term Goal 2: Patient will increase PROM to [facility] for increased ability to don stockings. Short Term Goal 3: Patient will increase left shoulder  strength to 3+/5 for increased ability to reach and lift overhead. Short Term Goal 4: Patient will decrease pain to 4/10 during functional activities. Short Term Goal 5: Patient will decrease fascial restrictions to min-mod in her left shoulder region. Long Term Goals Time to Complete Long Term Goals: 12 weeks Long Term Goal 1: Patient will return to prior level of I with all B/IADLs, work, and leisure activities. Long Term Goal 2: Patient will increase AROM in left shoulder region to WNL for increased ability to reach overhead at work. Long Term Goal 3: Patient will increase left shoulder strength to 5/5 for increased I with lifting bins at work. Long Term Goal 4: Patient will decrease left shoulder pain to 2/10 when completing household cleaning tasks. Long Term Goal 5: Patient will decrease fascial restrictions to minimal - trace in her left shoulder region.  Problem List Patient Active Problem List  Diagnoses  . CHONDROMALACIA OF PATELLA  . POPLITEAL CYST, LEFT  . FOOT PAIN  . MEDIAL MENISCUS TEAR, LEFT  . Pain in joint, shoulder region  . Muscle weakness (generalized)  . Status post rotator cuff repair    End of Session Activity Tolerance: Patient tolerated treatment well General Behavior During Session: [facility] for tasks performed Cognition: [facility] for tasks performed  GO No functional reporting required  Shirlean Mylar, OTR/L  08/19/2011, 2:21 PM

## 2011-08-23 ENCOUNTER — Ambulatory Visit (HOSPITAL_COMMUNITY)
Admission: RE | Admit: 2011-08-23 | Discharge: 2011-08-23 | Disposition: A | Discharge status: Home / Self Care | Payer: BC Managed Care – PPO | Source: Ambulatory Visit | Attending: Family Medicine | Admitting: Family Medicine

## 2011-08-23 DIAGNOSIS — M6281 Muscle weakness (generalized): Secondary | ICD-10-CM

## 2011-08-23 DIAGNOSIS — Z9889 Other specified postprocedural states: Secondary | ICD-10-CM

## 2011-08-23 DIAGNOSIS — M25519 Pain in unspecified shoulder: Secondary | ICD-10-CM

## 2011-08-23 NOTE — Progress Notes (Signed)
Occupational Therapy Treatment Patient Details  Name: BERDA SHELVIN MRN: [id number] Date of Birth: 03/25/1961  Today's Date: 08/23/2011 Time: 4098-1191 OT Time Calculation (min): 46 min Manual Therapy [phone] 31' Therapeutic Exercise 312-326 14'  Visit#: 10  of 16   Re-eval: 09/14/11 Assessment Diagnosis: S/P Left RCR  Surgical Date: 07/20/11   Subjective Symptoms/Limitations Symptoms: S:I'm doing ok.  Precautions/Restrictions  Precautions Precautions: Shoulder Type of Shoulder Precautions: PROM only through 5/21  Exercise/Treatments Supine Protraction: PROM;AAROM Horizontal ABduction: PROM;AAROM;10 reps External Rotation: PROM;AAROM;10 reps Internal Rotation: PROM;AAROM;10 reps Flexion: PROM;AAROM;10 reps ABduction: PROM;AAROM;10 reps Seated Elevation: AROM;15 reps Extension: AROM;15 reps Row: AROM;15 reps Pulleys Flexion: 2 minutes ABduction: 2 minutes Therapy Ball Flexion: 20 reps ABduction: 20 reps ROM / Strengthening / Isometric Strengthening   Flexion: 5X5" Extension: 5X5" External Rotation: 5X5" Internal Rotation: 5X5" ABduction: 5X5" ADduction: 5X5"       Manual Therapy Manual Therapy: Myofascial release Myofascial Release: MFR and manual stretching to left upper arm, scapular, and shoulder region to decrease pain and restrictions and increase mobility. Added scar release.  Occupational Therapy Assessment and Plan OT Assessment and Plan Clinical Impression Statement: A:  Increased reps with dowel ex.  Tactile and verbal cues to keep shoulder depressed with pullies Rehab Potential: Excellent OT Plan: P:  Continue with AAROM till 5/21.   Goals Short Term Goals Time to Complete Short Term Goals: Other (comment) (6 weeks) Short Term Goal 1: Patient will be educated on HEP. Short Term Goal 2: Patient will increase PROM to [facility] for increased ability to don stockings. Short Term Goal 3: Patient will increase left shoulder strength to 3+/5 for  increased ability to reach and lift overhead. Short Term Goal 4: Patient will decrease pain to 4/10 during functional activities. Short Term Goal 5: Patient will decrease fascial restrictions to min-mod in her left shoulder region. Long Term Goals Time to Complete Long Term Goals: 12 weeks Long Term Goal 1: Patient will return to prior level of I with all B/IADLs, work, and leisure activities. Long Term Goal 2: Patient will increase AROM in left shoulder region to WNL for increased ability to reach overhead at work. Long Term Goal 3: Patient will increase left shoulder strength to 5/5 for increased I with lifting bins at work. Long Term Goal 4: Patient will decrease left shoulder pain to 2/10 when completing household cleaning tasks. Long Term Goal 5: Patient will decrease fascial restrictions to minimal - trace in her left shoulder region.  Problem List Patient Active Problem List  Diagnoses  . CHONDROMALACIA OF PATELLA  . POPLITEAL CYST, LEFT  . FOOT PAIN  . MEDIAL MENISCUS TEAR, LEFT  . Pain in joint, shoulder region  . Muscle weakness (generalized)  . Status post rotator cuff repair    End of Session Activity Tolerance: Patient tolerated treatment well General Behavior During Session: [facility] for tasks performed Cognition: [facility] for tasks performed  GO No functional reporting required   Ishika Chesterfield L. Vona Whiters, COTA/L  08/23/2011, 3:51 PM

## 2011-08-24 ENCOUNTER — Ambulatory Visit (HOSPITAL_COMMUNITY)
Admission: RE | Admit: 2011-08-24 | Discharge: 2011-08-24 | Disposition: A | Discharge status: Home / Self Care | Payer: BC Managed Care – PPO | Source: Ambulatory Visit | Attending: Family Medicine | Admitting: Family Medicine

## 2011-08-24 DIAGNOSIS — M25519 Pain in unspecified shoulder: Secondary | ICD-10-CM

## 2011-08-24 DIAGNOSIS — Z9889 Other specified postprocedural states: Secondary | ICD-10-CM

## 2011-08-24 DIAGNOSIS — M6281 Muscle weakness (generalized): Secondary | ICD-10-CM

## 2011-08-24 NOTE — Progress Notes (Signed)
Occupational Therapy Treatment Patient Details  Name: Jo Hale MRN: [id number] Date of Birth: 10-20-1960  Today's Date: 08/24/2011 Time: 4098-1191 OT Time Calculation (min): 48 min Manual Therapy [phone] 24' Therapeutic Exercise 300-324 24'  Visit#: 11  of 16   Re-eval: 09/14/11 Assessment Diagnosis: S/P Left RCR  Surgical Date: 07/20/11  Subjective Symptoms/Limitations Symptoms: S:  it just feels tight. Pain Assessment Currently in Pain?: No/denies  Precautions/Restrictions  Precautions Precautions: Shoulder Type of Shoulder Precautions: PROM only through 5/21  Exercise/Treatments Supine Protraction: PROM;AAROM;15 reps Horizontal ABduction: PROM;AAROM;10 reps;15 reps External Rotation: PROM;AAROM;10 reps Internal Rotation: PROM;AAROM;15 reps Flexion: PROM;AAROM;15 reps ABduction: PROM;AAROM;15 reps Seated Elevation: AROM;15 reps Extension: AROM;15 reps Row: AROM;15 reps Pulleys Flexion: 2 minutes ABduction: 2 minutes Therapy Ball Flexion: 20 reps ABduction: 20 reps     Manual Therapy Manual Therapy: Myofascial release Myofascial Release: MFR and manual stretching to left upper arm, scapular, and shoulder region to decrease pain and restrictions and increase mobility. Added scar release  Occupational Therapy Assessment and Plan OT Assessment and Plan Clinical Impression Statement: A:  Multiple restrictions felt in upper trap and joint itself felft tight with PROM.  Patient did not c/o much pain with PROM just felt tight.  Encouraged to keep up HEP OT Plan: P: Continue with AAROM till 5/21   Goals Short Term Goals Time to Complete Short Term Goals: Other (comment) (6 weeks) Short Term Goal 1: Patient will be educated on HEP. Short Term Goal 2: Patient will increase PROM to [facility] for increased ability to don stockings. Short Term Goal 3: Patient will increase left shoulder strength to 3+/5 for increased ability to reach and lift overhead. Short Term  Goal 4: Patient will decrease pain to 4/10 during functional activities. Short Term Goal 5: Patient will decrease fascial restrictions to min-mod in her left shoulder region. Long Term Goals Time to Complete Long Term Goals: 12 weeks Long Term Goal 1: Patient will return to prior level of I with all B/IADLs, work, and leisure activities. Long Term Goal 2: Patient will increase AROM in left shoulder region to WNL for increased ability to reach overhead at work. Long Term Goal 3: Patient will increase left shoulder strength to 5/5 for increased I with lifting bins at work. Long Term Goal 4: Patient will decrease left shoulder pain to 2/10 when completing household cleaning tasks. Long Term Goal 5: Patient will decrease fascial restrictions to minimal - trace in her left shoulder region.  Problem List Patient Active Problem List  Diagnoses  . CHONDROMALACIA OF PATELLA  . POPLITEAL CYST, LEFT  . FOOT PAIN  . MEDIAL MENISCUS TEAR, LEFT  . Pain in joint, shoulder region  . Muscle weakness (generalized)  . Status post rotator cuff repair    End of Session Activity Tolerance: Patient tolerated treatment well General Behavior During Session: [facility] for tasks performed  GO No functional reporting required   Fate Caster L. Arasely Akkerman, COTA/L  08/24/2011, 3:29 PM

## 2011-08-26 ENCOUNTER — Ambulatory Visit (HOSPITAL_COMMUNITY)
Admission: RE | Admit: 2011-08-26 | Discharge: 2011-08-26 | Disposition: A | Discharge status: Home / Self Care | Payer: BC Managed Care – PPO | Source: Ambulatory Visit | Attending: Family Medicine | Admitting: Family Medicine

## 2011-08-26 DIAGNOSIS — M25519 Pain in unspecified shoulder: Secondary | ICD-10-CM

## 2011-08-26 DIAGNOSIS — Z9889 Other specified postprocedural states: Secondary | ICD-10-CM

## 2011-08-26 DIAGNOSIS — M6281 Muscle weakness (generalized): Secondary | ICD-10-CM

## 2011-08-26 NOTE — Progress Notes (Signed)
Occupational Therapy Treatment Patient Details  Name: SHAVAUN OSTERLOH MRN: [id number] Date of Birth: [birth year]  Today's Date: 08/26/2011 Time: 1478-2956 OT Time Calculation (min): 60 min Manual Therapy [phone] 31' Therapeutic Exercises [phone] 39'   Visit#: 12  of 16   Re-eval: 09/14/11     Subjective Symptoms/Limitations Symptoms: S:  "Is it possible that the scar tissue isnt healed?" Limitations: Discussed healing process with patient. Pain Assessment Pain Score: 0-No pain  Precautions/Restrictions   AROM to begin on 08/31/11  Exercise/Treatments Supine Protraction: PROM;10 reps;AAROM;15 reps Horizontal ABduction: PROM;10 reps;AAROM;15 reps External Rotation: PROM;10 reps;AAROM;15 reps Internal Rotation: PROM;10 reps;AAROM;15 reps Flexion: PROM;10 reps;AAROM;15 reps ABduction: PROM;10 reps;AAROM;15 reps Seated Elevation: AROM;15 reps Extension: AROM;15 reps Row: AROM;15 reps Pulleys Flexion: 3 minutes ABduction: 3 minutes Therapy Ball Flexion: 25 reps ABduction: 25 reps ROM / Strengthening / Isometric Strengthening   Flexion: 5X5" Extension: 5X5" External Rotation: 5X5" Internal Rotation: 5X5" ABduction: 5X5" ADduction: 5X5"    Manual Therapy Manual Therapy: Myofascial release Myofascial Release: MFR and manual stretching to left upper arm, scapular, and shoulder region to decrease pain and restrictions and increase mobility.  Scar release and cross hand release over scar completed this date as patient has max scar tissue on anterior incision.  [phone]  Occupational Therapy Assessment and Plan OT Assessment and Plan Clinical Impression Statement: A:  Abduction above 90 with pulleys is very restricted.  Assessed scapula-humeral rhythm and it is [facility].  Traps with max restrictions.  Required vg for technique with pulley exercise for abduction. OT Plan: P:  Add thumbtacks, prot/ret//elev/dep and low wall wash.    Goals Short Term Goals Time to  Complete Short Term Goals: Other (comment) (6 weeks) Short Term Goal 1: Patient will be educated on HEP. Short Term Goal 1 Progress: Met Short Term Goal 2: Patient will increase PROM to [facility] for increased ability to don stockings. Short Term Goal 2 Progress: Progressing toward goal Short Term Goal 3: Patient will increase left shoulder strength to 3+/5 for increased ability to reach and lift overhead. Short Term Goal 3 Progress: Progressing toward goal Short Term Goal 4: Patient will decrease pain to 4/10 during functional activities. Short Term Goal 4 Progress: Met Short Term Goal 5: Patient will decrease fascial restrictions to min-mod in her left shoulder region. Short Term Goal 5 Progress: Met Long Term Goals Time to Complete Long Term Goals: 12 weeks Long Term Goal 1: Patient will return to prior level of I with all B/IADLs, work, and leisure activities. Long Term Goal 1 Progress: Progressing toward goal Long Term Goal 2: Patient will increase AROM in left shoulder region to WNL for increased ability to reach overhead at work. Long Term Goal 2 Progress: Progressing toward goal Long Term Goal 3: Patient will increase left shoulder strength to 5/5 for increased I with lifting bins at work. Long Term Goal 3 Progress: Progressing toward goal Long Term Goal 4: Patient will decrease left shoulder pain to 2/10 when completing household cleaning tasks. Long Term Goal 4 Progress: Progressing toward goal Long Term Goal 5: Patient will decrease fascial restrictions to minimal - trace in her left shoulder region. Long Term Goal 5 Progress: Progressing toward goal  Problem List Patient Active Problem List  Diagnoses  . CHONDROMALACIA OF PATELLA  . POPLITEAL CYST, LEFT  . FOOT PAIN  . MEDIAL MENISCUS TEAR, LEFT  . Pain in joint, shoulder region  . Muscle weakness (generalized)  . Status post rotator cuff repair    End  of Session Activity Tolerance: Patient tolerated treatment  well General Behavior During Session: [facility] for tasks performed Cognition: [facility] for tasks performed  GO No functional reporting required  Shirlean Mylar, OTR/L  08/26/2011, 2:32 PM

## 2011-08-31 ENCOUNTER — Ambulatory Visit (HOSPITAL_COMMUNITY)
Admission: RE | Admit: 2011-08-31 | Discharge: 2011-08-31 | Disposition: A | Discharge status: Home / Self Care | Payer: BC Managed Care – PPO | Source: Ambulatory Visit | Attending: Family Medicine | Admitting: Family Medicine

## 2011-08-31 DIAGNOSIS — Z9889 Other specified postprocedural states: Secondary | ICD-10-CM

## 2011-08-31 DIAGNOSIS — M6281 Muscle weakness (generalized): Secondary | ICD-10-CM

## 2011-08-31 DIAGNOSIS — M25519 Pain in unspecified shoulder: Secondary | ICD-10-CM

## 2011-08-31 NOTE — Progress Notes (Signed)
Occupational Therapy Treatment Patient Details  Name: NIEVE ROJERO MRN: [id number] Date of Birth: 08/13/1960  Today's Date: 08/31/2011 Time: 7846-9629 OT Time Calculation (min): 50 min Manual Therapy [phone] 25' Therapeutic Exercise 244-010 24'  Visit#: 13  of 16   Re-eval: 09/14/11 Assessment Diagnosis: S/P Left RCR  Surgical Date: 07/20/11   Subjective Symptoms/Limitations Symptoms: S:  I am so tight.  Precautions/Restrictions  Precautions Precautions: Shoulder Type of Shoulder Precautions: PROM only through 5/21  Exercise/Treatments Supine Protraction: PROM;10 reps;AAROM;15 reps Horizontal ABduction: PROM;10 reps;AAROM;15 reps External Rotation: PROM;10 reps;AAROM;15 reps Internal Rotation: PROM;10 reps;AAROM;15 reps Flexion: PROM;10 reps;AAROM;15 reps ABduction: PROM;10 reps;AAROM;15 reps Seated Elevation: AROM;15 reps Extension: AROM;15 reps Row: AROM;15 reps Pulleys Flexion: 3 minutes ABduction: 3 minutes Therapy Ball Flexion: 25 reps ABduction: 25 reps ROM / Strengthening / Isometric Strengthening Wall Wash: 2' Thumb Tacks: 1' Prot/Ret//Elev/Dep: 1'      Manual Therapy Manual Therapy: Myofascial release Myofascial Release: MFR and manual stretching to left upper arm, scapular, and shoulder region to decrease pain and restrictions and increase mobility  Occupational Therapy Assessment and Plan OT Assessment and Plan Clinical Impression Statement: A:  Added wall wash, thumbtacks, and pro/ret/elev/dep Rehab Potential: Excellent OT Plan: P:  Added AROM supine.   Goals Short Term Goals Time to Complete Short Term Goals: Other (comment) (6 weeks) Short Term Goal 1: Patient will be educated on HEP. Short Term Goal 2: Patient will increase PROM to [facility] for increased ability to don stockings. Short Term Goal 3: Patient will increase left shoulder strength to 3+/5 for increased ability to reach and lift overhead. Short Term Goal 4: Patient will  decrease pain to 4/10 during functional activities. Short Term Goal 5: Patient will decrease fascial restrictions to min-mod in her left shoulder region. Long Term Goals Time to Complete Long Term Goals: 12 weeks Long Term Goal 1: Patient will return to prior level of I with all B/IADLs, work, and leisure activities. Long Term Goal 2: Patient will increase AROM in left shoulder region to WNL for increased ability to reach overhead at work. Long Term Goal 3: Patient will increase left shoulder strength to 5/5 for increased I with lifting bins at work. Long Term Goal 4: Patient will decrease left shoulder pain to 2/10 when completing household cleaning tasks. Long Term Goal 5: Patient will decrease fascial restrictions to minimal - trace in her left shoulder region.  Problem List Patient Active Problem List  Diagnoses  . CHONDROMALACIA OF PATELLA  . POPLITEAL CYST, LEFT  . FOOT PAIN  . MEDIAL MENISCUS TEAR, LEFT  . Pain in joint, shoulder region  . Muscle weakness (generalized)  . Status post rotator cuff repair    End of Session Activity Tolerance: Patient tolerated treatment well General Behavior During Session: [facility] for tasks performed Cognition: [facility] for tasks performed  GO No functional reporting required   Michaelah Credeur L. Windsor Goeken, COTA/L  08/31/2011, 9:46 AM

## 2011-09-01 ENCOUNTER — Ambulatory Visit (HOSPITAL_COMMUNITY)
Admission: RE | Admit: 2011-09-01 | Discharge: 2011-09-01 | Disposition: A | Discharge status: Home / Self Care | Payer: BC Managed Care – PPO | Source: Ambulatory Visit | Attending: Family Medicine | Admitting: Family Medicine

## 2011-09-01 NOTE — Progress Notes (Signed)
Occupational Therapy Treatment Patient Details  Name: Jo Hale MRN: [id number] Date of Birth: [birth year]  Today's Date: 09/01/2011 Time: 2130-8657 OT Time Calculation (min): 60 min Manual Therapy [phone] 16' Therapeutic Exercises [phone] 67' Visit#: 14  of 16   Re-eval: 09/14/11    Subjective Symptoms/Limitations Symptoms: Some of the stretches make it sore (especially Ext Rot) Pain Assessment Currently in Pain?: Yes Pain Score:   1 Pain Location: Shoulder Pain Orientation: Left Pain Type: Acute pain  O:  Exercise/Treatments Supine Protraction: PROM;10 reps;AAROM;15 reps;AROM;5 reps Horizontal ABduction: PROM;10 reps;AAROM;15 reps;AROM;5 reps External Rotation: PROM;10 reps;AAROM;15 reps;AROM;5 reps Internal Rotation: PROM;10 reps;AAROM;15 reps;AROM;5 reps Flexion: PROM;10 reps;AAROM;15 reps;AROM;5 reps ABduction: PROM;10 reps;AAROM;15 reps;AROM;5 reps Seated Elevation: AROM;15 reps Extension: AROM;15 reps Retraction: AROM;10 reps Row: AROM;15 reps Pulleys Flexion: 3 minutes ABduction: 3 minutes (required cueing for proper positioning) Therapy Ball Flexion: 25 reps ABduction: 25 reps ROM / Strengthening / Isometric Strengthening Wall Wash: 2.5' Thumb Tacks: 1' Prot/Ret//Elev/Dep: resume next visit      Manual Therapy Manual Therapy: Myofascial release Myofascial Release: MFR and manual stretching to left upper arm, scapular, and shoulder region to decrease pain and restrictions and increase pain free mobility.  scar release completed along surgical incision.  [phone]  Occupational Therapy Assessment and Plan OT Assessment and Plan Clinical Impression Statement: A: Added retraction exercise in seated position and AROM in supine. Increased duration of wall wash. Patient required verbal cueing for proper arm positioning during the pulley exercise. OT Plan: Add repetitions of AROM in supine and prot/ret/elev/dep in standing. Increase time of wall wash  and thumb tack activities.    Goals Short Term Goals Time to Complete Short Term Goals: Other (comment) (6 weeks) Short Term Goal 1: Patient will be educated on HEP. Short Term Goal 2: Patient will increase PROM to [facility] for increased ability to don stockings. Short Term Goal 3: Patient will increase left shoulder strength to 3+/5 for increased ability to reach and lift overhead. Short Term Goal 4: Patient will decrease pain to 4/10 during functional activities. Short Term Goal 5: Patient will decrease fascial restrictions to min-mod in her left shoulder region. Long Term Goals Time to Complete Long Term Goals: 12 weeks Long Term Goal 1: Patient will return to prior level of I with all B/IADLs, work, and leisure activities. Long Term Goal 1 Progress: Progressing toward goal Long Term Goal 2: Patient will increase AROM in left shoulder region to WNL for increased ability to reach overhead at work. Long Term Goal 2 Progress: Progressing toward goal Long Term Goal 3: Patient will increase left shoulder strength to 5/5 for increased I with lifting bins at work. Long Term Goal 3 Progress: Progressing toward goal Long Term Goal 4: Patient will decrease left shoulder pain to 2/10 when completing household cleaning tasks. Long Term Goal 4 Progress: Progressing toward goal Long Term Goal 5: Patient will decrease fascial restrictions to minimal - trace in her left shoulder region. Long Term Goal 5 Progress: Progressing toward goal  Problem List Patient Active Problem List  Diagnoses  . CHONDROMALACIA OF PATELLA  . POPLITEAL CYST, LEFT  . FOOT PAIN  . MEDIAL MENISCUS TEAR, LEFT  . Pain in joint, shoulder region  . Muscle weakness (generalized)  . Status post rotator cuff repair    End of Session Activity Tolerance: Patient tolerated treatment well General Behavior During Session: [facility] for tasks performed Cognition: [facility] for tasks performed  GO No functional reporting required  Shirlean Mylar, OTR/L  09/01/2011, 11:22 AM

## 2011-09-02 ENCOUNTER — Ambulatory Visit (HOSPITAL_COMMUNITY)
Admission: RE | Admit: 2011-09-02 | Discharge: 2011-09-02 | Disposition: A | Discharge status: Home / Self Care | Payer: BC Managed Care – PPO | Source: Ambulatory Visit | Attending: Family Medicine | Admitting: Family Medicine

## 2011-09-02 NOTE — Progress Notes (Signed)
Occupational Therapy Treatment Patient Details  Name: Jo Hale MRN: [id number] Date of Birth: [birth year]  Today's Date: 09/02/2011 Time: 1350-1440 OT Time Calculation (min): 50 min  Manual Therapy: 1350-1410 20' Therapeutic Exercise: 1410-1440 30' Visit#: 15  of 16   Re-eval: 09/14/11   Subjective Symptoms/Limitations Symptoms: S: I have been using my left arm more at home. I use it when I'm doing dishes or cleaning up the bathroom. Pain Assessment Currently in Pain?: Yes Pain Score:   1 Pain Location: Shoulder Pain Orientation: Left Pain Type: Acute pain  Exercise/Treatments Supine Protraction: PROM;AROM;10 reps;AAROM;15 reps Horizontal ABduction: PROM;AROM;10 reps;AAROM;15 reps External Rotation: PROM;AROM;10 reps;AAROM;15 reps Internal Rotation: PROM;AROM;10 reps;AAROM;15 reps Flexion: PROM;AROM;10 reps;AAROM;15 reps ABduction: PROM;AROM;10 reps;AAROM;15 reps Seated Elevation: AROM;15 reps Extension: AROM;15 reps Retraction: AROM;15 reps Row: AROM;15 reps Pulleys Flexion: 2 minutes ABduction: 2 minutes Therapy Ball Flexion: 25 reps ABduction: 25 reps ROM / Strengthening / Isometric Strengthening Wall Wash: 2.5' Thumb Tacks: 1' Prot/Ret//Elev/Dep: 1'      Manual Therapy Myofascial Release: MFR and manual stretching to left upper arm, scapular, and shoulder region to decrease pain and restrictions and increase pain free mobility. Scar release completed along surgical incision. Less scar restrictions noted. [id number]  Occupational Therapy Assessment and Plan OT Assessment and Plan Clinical Impression Statement: A: Increased reps of supine AROM exercise. Patient continued to require verbal cueing for proper arm positioning during the pulley exercise; Patient displays difficulty with PROM and AAROM abduction  in seated position.  OT Plan: P: Continue with AROM in supine. D/C abduction with pulley. Will manually facilitate abduction in seated position.      Goals Short Term Goals Time to Complete Short Term Goals: Other (comment) (6 weeks) Short Term Goal 1: Patient will be educated on HEP. Short Term Goal 1 Progress: Met Short Term Goal 2: Patient will increase PROM to [facility] for increased ability to don stockings. Short Term Goal 2 Progress: Progressing toward goal Short Term Goal 3: Patient will increase left shoulder strength to 3+/5 for increased ability to reach and lift overhead. Short Term Goal 3 Progress: Progressing toward goal Short Term Goal 4: Patient will decrease pain to 4/10 during functional activities. Short Term Goal 4 Progress: Progressing toward goal Short Term Goal 5: Patient will decrease fascial restrictions to min-mod in her left shoulder region. Short Term Goal 5 Progress: Met Long Term Goals Time to Complete Long Term Goals: 12 weeks Long Term Goal 1: Patient will return to prior level of I with all B/IADLs, work, and leisure activities. Long Term Goal 1 Progress: Progressing toward goal Long Term Goal 2: Patient will increase AROM in left shoulder region to WNL for increased ability to reach overhead at work. Long Term Goal 2 Progress: Progressing toward goal Long Term Goal 3: Patient will increase left shoulder strength to 5/5 for increased I with lifting bins at work. Long Term Goal 3 Progress: Progressing toward goal Long Term Goal 4: Patient will decrease left shoulder pain to 2/10 when completing household cleaning tasks. Long Term Goal 4 Progress: Progressing toward goal Long Term Goal 5: Patient will decrease fascial restrictions to minimal - trace in her left shoulder region. Long Term Goal 5 Progress: Progressing toward goal  Problem List Patient Active Problem List  Diagnoses  . CHONDROMALACIA OF PATELLA  . POPLITEAL CYST, LEFT  . FOOT PAIN  . MEDIAL MENISCUS TEAR, LEFT  . Pain in joint, shoulder region  . Muscle weakness (generalized)  . Status post rotator cuff repair  End of  Session Activity Tolerance: Patient tolerated treatment well General Behavior During Session: [facility] for tasks performed Cognition: [facility] for tasks performed  GO No functional reporting required  Armandina Stammer, OTS Note reviewed by clinical instructor and accurately reflects treatment session. Shirlean Mylar, OTR/L 09/02/2011, 3:05 PM

## 2011-09-07 ENCOUNTER — Ambulatory Visit (HOSPITAL_COMMUNITY)
Admission: RE | Admit: 2011-09-07 | Discharge: 2011-09-07 | Disposition: A | Discharge status: Home / Self Care | Payer: BC Managed Care – PPO | Source: Ambulatory Visit | Attending: Family Medicine | Admitting: Family Medicine

## 2011-09-07 DIAGNOSIS — Z9889 Other specified postprocedural states: Secondary | ICD-10-CM

## 2011-09-07 DIAGNOSIS — M25519 Pain in unspecified shoulder: Secondary | ICD-10-CM

## 2011-09-07 DIAGNOSIS — M6281 Muscle weakness (generalized): Secondary | ICD-10-CM

## 2011-09-07 NOTE — Progress Notes (Signed)
Occupational Therapy Treatment Patient Details  Name: Jo Hale MRN: [id number] Date of Birth: [birth year]  Today's Date: 09/07/2011 Time: 7846-9629 OT Time Calculation (min): 71 min Manual Therapy 104-136 32' Therapeutic Exercise 137-215 38'  Visit#: 16  of 16   Re-eval: 09/14/11 Assessment Diagnosis: S/P Left RCR  Surgical Date: 07/20/11   Subjective Symptoms/Limitations Symptoms: S: Ihave a doctors appointment next week. Pain Assessment Currently in Pain?: Yes Pain Score:   2 Pain Location: Shoulder Pain Orientation: Left Pain Type: Acute pain  Precautions/Restrictions  Precautions Precautions: Shoulder Type of Shoulder Precautions: PROM only through 5/21  Exercise/Treatments Supine Protraction: PROM;AROM;AAROM;15 reps Horizontal ABduction: PROM;AROM;AAROM;15 reps External Rotation: PROM;AROM;AAROM;15 reps Internal Rotation: PROM;AROM;AAROM;15 reps Flexion: PROM;AROM;AAROM;15 reps ABduction: PROM;AROM;AAROM;15 reps Seated Elevation: AROM;15 reps Extension: AROM;15 reps Retraction: AROM;15 reps Row: AROM;15 reps Pulleys Flexion: 2 minutes ABduction: 2 minutes Therapy Ball Flexion: 25 reps ABduction: 25 reps ROM / Strengthening / Isometric Strengthening Wall Wash: 3.0 Thumb Tacks: 1 Prot/Ret//Elev/Dep: 1'      Manual Therapy Manual Therapy: Myofascial release Myofascial Release: MFR and manual stretching to left upper arm, scapular, and shoulder region to decrease pain and restrictions and increase pain free mobility. Scar release completed along surgical incision  Occupational Therapy Assessment and Plan OT Assessment and Plan Clinical Impression Statement: A:  Patient stated she helped her brother move this weekend.  Stated she did not pick up anything too heavy but patient was very restricted and tight in joint.  ABD very restrictec OT Plan: P:  Add AROM seated, will need assist to facilitate abduction.   Goals Short Term Goals Time to  Complete Short Term Goals: Other (comment) (6 weeks) Short Term Goal 1: Patient will be educated on HEP. Short Term Goal 2: Patient will increase PROM to [facility] for increased ability to don stockings. Short Term Goal 3: Patient will increase left shoulder strength to 3+/5 for increased ability to reach and lift overhead. Short Term Goal 4: Patient will decrease pain to 4/10 during functional activities. Short Term Goal 5: Patient will decrease fascial restrictions to min-mod in her left shoulder region. Long Term Goals Time to Complete Long Term Goals: 12 weeks Long Term Goal 1: Patient will return to prior level of I with all B/IADLs, work, and leisure activities. Long Term Goal 2: Patient will increase AROM in left shoulder region to WNL for increased ability to reach overhead at work. Long Term Goal 3: Patient will increase left shoulder strength to 5/5 for increased I with lifting bins at work. Long Term Goal 4: Patient will decrease left shoulder pain to 2/10 when completing household cleaning tasks. Long Term Goal 5: Patient will decrease fascial restrictions to minimal - trace in her left shoulder region.  Problem List Patient Active Problem List  Diagnoses  . CHONDROMALACIA OF PATELLA  . POPLITEAL CYST, LEFT  . FOOT PAIN  . MEDIAL MENISCUS TEAR, LEFT  . Pain in joint, shoulder region  . Muscle weakness (generalized)  . Status post rotator cuff repair    End of Session Activity Tolerance: Patient tolerated treatment well General Behavior During Session: [facility] for tasks performed Cognition: [facility] for tasks performed  GO No functional reporting required   Estelene Carmack L. Noralee Stain, COTA/L  09/07/2011, 3:23 PM

## 2011-09-08 ENCOUNTER — Ambulatory Visit (HOSPITAL_COMMUNITY)
Admission: RE | Admit: 2011-09-08 | Discharge: 2011-09-08 | Disposition: A | Discharge status: Home / Self Care | Payer: BC Managed Care – PPO | Source: Ambulatory Visit | Attending: Family Medicine | Admitting: Family Medicine

## 2011-09-08 DIAGNOSIS — Z9889 Other specified postprocedural states: Secondary | ICD-10-CM

## 2011-09-08 DIAGNOSIS — M6281 Muscle weakness (generalized): Secondary | ICD-10-CM

## 2011-09-08 DIAGNOSIS — M25519 Pain in unspecified shoulder: Secondary | ICD-10-CM

## 2011-09-08 NOTE — Progress Notes (Signed)
Occupational Therapy Treatment Patient Details  Name: Jo Hale MRN: [id number] Date of Birth: Jun 12, 1960  Today's Date: 09/08/2011 Time: 7829-5621 OT Time Calculation (min): 60 min Manual Therapy 3086-5784 31' Therapeutic Exercises 1335-1404 29' Visit#: 17  of 36   Re-eval: 09/14/11     Subjective Symptoms/Limitations Symptoms: S:  I just dont know how to relax. Pain Assessment Currently in Pain?: Yes Pain Score:   2 Pain Location: Shoulder Pain Orientation: Left Pain Type: Acute pain  Precautions/Restrictions     Exercise/Treatments Supine Protraction: PROM;AROM;10 reps Horizontal ABduction: PROM;AROM;10 reps External Rotation: PROM;AROM;10 reps Internal Rotation: PROM;AROM;10 reps Flexion: PROM;AROM;10 reps ABduction: PROM;AROM;10 reps Seated Elevation: AROM;15 reps Extension: AROM;15 reps Retraction: AROM;15 reps Row: AROM;15 reps Other Seated Exercises: seated at table with left arm abducted in 90 on small therapy ball on table.  then completed ball stretch into abd x 15 with mod facilitation to depress scapula and allow for scapular-humeral rhythm to take place.  Pulleys Flexion: 2 minutes ABduction: 2 minutes Therapy Ball Flexion: 25 reps ABduction: 25 reps ROM / Strengthening / Isometric Strengthening Wall Wash: 3' Thumb Tacks: 1' Prot/Ret//Elev/Dep: 1'      Manual Therapy Manual Therapy: Myofascial release Myofascial Release: MFR and manual stretching to left upper arm, scapular, and shoulder region t o decrease pain and restricitons and increase pain free mobility.  Scar release completed to scar and associated area.  Mrs. Mandrell is very guarded with PROM and does not demonstrate a good scapulohumeral rhythm.  [phone]  Occupational Therapy Assessment and Plan OT Assessment and Plan Clinical Impression Statement: A:  Patient requires max vg and tactile cues to depress shoulder and allow for scapular humeral rhythm during abduction.   Patient will need constant muscle retraining to relearn proprer movement pattern. OT Plan: P:  Continue AROM in supine, continue retraining of muscles to allow for scapular depression during active AROM.   Goals Short Term Goals Time to Complete Short Term Goals: Other (comment) (6 weeks) Short Term Goal 1: Patient will be educated on HEP. Short Term Goal 1 Progress: Met Short Term Goal 2: Patient will increase PROM to [facility] for increased ability to don stockings. Short Term Goal 2 Progress: Progressing toward goal Short Term Goal 3: Patient will increase left shoulder strength to 3+/5 for increased ability to reach and lift overhead. Short Term Goal 3 Progress: Progressing toward goal Short Term Goal 4: Patient will decrease pain to 4/10 during functional activities. Short Term Goal 4 Progress: Progressing toward goal Short Term Goal 5: Patient will decrease fascial restrictions to min-mod in her left shoulder region. Short Term Goal 5 Progress: Progressing toward goal Long Term Goals Time to Complete Long Term Goals: 12 weeks Long Term Goal 1: Patient will return to prior level of I with all B/IADLs, work, and leisure activities. Long Term Goal 1 Progress: Progressing toward goal Long Term Goal 2: Patient will increase AROM in left shoulder region to WNL for increased ability to reach overhead at work. Long Term Goal 2 Progress: Progressing toward goal Long Term Goal 3: Patient will increase left shoulder strength to 5/5 for increased I with lifting bins at work. Long Term Goal 3 Progress: Progressing toward goal Long Term Goal 4: Patient will decrease left shoulder pain to 2/10 when completing household cleaning tasks. Long Term Goal 4 Progress: Progressing toward goal Long Term Goal 5: Patient will decrease fascial restrictions to minimal - trace in her left shoulder region. Long Term Goal 5 Progress: Progressing toward goal  Problem List Patient Active Problem List  Diagnoses  .  CHONDROMALACIA OF PATELLA  . POPLITEAL CYST, LEFT  . FOOT PAIN  . MEDIAL MENISCUS TEAR, LEFT  . Pain in joint, shoulder region  . Muscle weakness (generalized)  . Status post rotator cuff repair    End of Session Activity Tolerance: Patient tolerated treatment well General Behavior During Session: [facility] for tasks performed Cognition: [facility] for tasks performed  GO No functional reporting required  Shirlean Mylar, OTR/L  09/08/2011, 4:33 PM

## 2011-09-09 ENCOUNTER — Ambulatory Visit (HOSPITAL_COMMUNITY)
Admission: RE | Admit: 2011-09-09 | Discharge: 2011-09-09 | Disposition: A | Discharge status: Home / Self Care | Payer: BC Managed Care – PPO | Source: Ambulatory Visit | Attending: Family Medicine | Admitting: Family Medicine

## 2011-09-09 NOTE — Progress Notes (Signed)
Occupational Therapy Treatment Patient Details  Name: Jo Hale MRN: [id number] Date of Birth: 26-Oct-1960  Today's Date: 09/09/2011 Time: 4098-1191 OT Time Calculation (min): 60 min  Manual Therapy: 1435-1510 35' Therapeutic Exercise: 1510-1535 25' Visit#: 18  of 36   Re-eval: 09/13/11    Subjective: S: Yesterdays exercises didn't make it too sore, but I expected to be able to use it more after and I couldn't. It felt the same.    Precautions/Restrictions  N/A  Exercise/Treatments Supine Protraction: PROM;10 reps;AROM;12 reps Horizontal ABduction: PROM;10 reps;AROM;12 reps External Rotation: PROM;10 reps;AROM;12 reps Internal Rotation: PROM;10 reps;AROM;12 reps Flexion: PROM;10 reps;AROM;12 reps ABduction: PROM;10 reps;AROM;12 reps Seated Elevation: AROM;15 reps Extension: AROM;15 reps Retraction: AROM;15 reps Row: AROM;15 reps Other Seated Exercises: seated at table with left arm abducted in 90 on small therapy ball on table.  then completed ball stretch into abd x 15 with mod facilitation to depress scapula and allow for scapular-humeral rhythm to take place. Also completed 10 reps of abduction with min assist from therapist for arm support.  Pulleys Flexion: Other (comment) (resume next visit) ABduction: Other (comment) (resume next visit) Therapy Ball Flexion: 25 reps ABduction: 25 reps ROM / Strengthening / Isometric Strengthening Wall Wash: 3' Thumb Tacks: 1'15" Prot/Ret//Elev/Dep: 4'78"      Manual Therapy Manual Therapy: Myofascial release Myofascial Release: MFR and manual stretching to left upper arm, scapular, levator scapulae and shoulder region to decrease pain and restrictions and increase pain free mobility.   Occupational Therapy Assessment and Plan OT Assessment and Plan Clinical Impression Statement: A: Patient demonstrated increased PROM and AROM this date. Continued with scapula mobility exercise on table using therapy ball, required mod  vc and pa to depress shoulder and allow for scapular movement during abduction.  OT Plan: P: Continue retraining of muscules to allow for scapular depression during AROM. Add AROM in seated position. Re-evaluate on 09-13-11.   Goals Short Term Goals Time to Complete Short Term Goals: Other (comment) (6 weeks) Short Term Goal 1: Patient will be educated on HEP. Short Term Goal 1 Progress: Met Short Term Goal 2: Patient will increase PROM to [facility] for increased ability to don stockings. Short Term Goal 2 Progress: Progressing toward goal Short Term Goal 3: Patient will increase left shoulder strength to 3+/5 for increased ability to reach and lift overhead. Short Term Goal 3 Progress: Progressing toward goal Short Term Goal 4: Patient will decrease pain to 4/10 during functional activities. Short Term Goal 4 Progress: Progressing toward goal Short Term Goal 5: Patient will decrease fascial restrictions to min-mod in her left shoulder region. Short Term Goal 5 Progress: Progressing toward goal Long Term Goals Time to Complete Long Term Goals: 12 weeks Long Term Goal 1: Patient will return to prior level of I with all B/IADLs, work, and leisure activities. Long Term Goal 1 Progress: Progressing toward goal Long Term Goal 2: Patient will increase AROM in left shoulder region to WNL for increased ability to reach overhead at work. Long Term Goal 2 Progress: Progressing toward goal Long Term Goal 3: Patient will increase left shoulder strength to 5/5 for increased I with lifting bins at work. Long Term Goal 3 Progress: Progressing toward goal Long Term Goal 4: Patient will decrease left shoulder pain to 2/10 when completing household cleaning tasks. Long Term Goal 4 Progress: Progressing toward goal Long Term Goal 5: Patient will decrease fascial restrictions to minimal - trace in her left shoulder region. Long Term Goal 5 Progress: Progressing toward  goal  Problem List Patient Active Problem List   Diagnoses  . CHONDROMALACIA OF PATELLA  . POPLITEAL CYST, LEFT  . FOOT PAIN  . MEDIAL MENISCUS TEAR, LEFT  . Pain in joint, shoulder region  . Muscle weakness (generalized)  . Status post rotator cuff repair    End of Session Activity Tolerance: Patient tolerated treatment well General Behavior During Session: [facility] for tasks performed Cognition: [facility] for tasks performed  GO No functional reporting required  [name], OTS Occupational Therapy Student  09/09/2011, 3:43 PM

## 2011-09-09 NOTE — Progress Notes (Signed)
Occupational Therapy Treatment Patient Details  Name: Jo Hale MRN: [id number] Date of Birth: 12-08-1960  Today's Date: 09/09/2011 Time: 4098-1191 OT Time Calculation (min): 60 min  Manual Therapy: 1435-1510 35' Therapeutic Exercise: 1510-1535 25' Visit#: 18  of 36   Re-eval: 09/13/11    Subjective: S: Yesterdays exercises didn't make it too sore, but I expected to be able to use it more after and I couldn't. It felt the same.    Precautions/Restrictions  N/A  Exercise/Treatments Supine Protraction: PROM;10 reps;AROM;12 reps Horizontal ABduction: PROM;10 reps;AROM;12 reps External Rotation: PROM;10 reps;AROM;12 reps Internal Rotation: PROM;10 reps;AROM;12 reps Flexion: PROM;10 reps;AROM;12 reps ABduction: PROM;10 reps;AROM;12 reps Seated Elevation: AROM;15 reps Extension: AROM;15 reps Retraction: AROM;15 reps Row: AROM;15 reps Other Seated Exercises: seated at table with left arm abducted in 90 on small therapy ball on table.  then completed ball stretch into abd x 15 with mod facilitation to depress scapula and allow for scapular-humeral rhythm to take place. Also completed 10 reps of abduction with min assist from therapist for arm support.  Pulleys Flexion: Other (comment) (resume next visit) ABduction: Other (comment) (resume next visit) Therapy Ball Flexion: 25 reps ABduction: 25 reps ROM / Strengthening / Isometric Strengthening Wall Wash: 3' Thumb Tacks: 1'15" Prot/Ret//Elev/Dep: 4'78"      Manual Therapy Manual Therapy: Myofascial release Myofascial Release: MFR and manual stretching to left upper arm, scapular, levator scapulae and shoulder region to decrease pain and restrictions and increase pain free mobility.   Occupational Therapy Assessment and Plan OT Assessment and Plan Clinical Impression Statement: A: Patient demonstrated increased PROM and AROM this date. Continued with scapula mobility exercise on table using therapy ball, required mod  vc and pa to depress shoulder and allow for scapular movement during abduction.  OT Plan: P: Continue retraining of muscules to allow for scapular depression during AROM. Add AROM in seated position. Re-evaluate on 09-13-11.   Goals Short Term Goals Time to Complete Short Term Goals: Other (comment) (6 weeks) Short Term Goal 1: Patient will be educated on HEP. Short Term Goal 1 Progress: Met Short Term Goal 2: Patient will increase PROM to [facility] for increased ability to don stockings. Short Term Goal 2 Progress: Progressing toward goal Short Term Goal 3: Patient will increase left shoulder strength to 3+/5 for increased ability to reach and lift overhead. Short Term Goal 3 Progress: Progressing toward goal Short Term Goal 4: Patient will decrease pain to 4/10 during functional activities. Short Term Goal 4 Progress: Progressing toward goal Short Term Goal 5: Patient will decrease fascial restrictions to min-mod in her left shoulder region. Short Term Goal 5 Progress: Progressing toward goal Long Term Goals Time to Complete Long Term Goals: 12 weeks Long Term Goal 1: Patient will return to prior level of I with all B/IADLs, work, and leisure activities. Long Term Goal 1 Progress: Progressing toward goal Long Term Goal 2: Patient will increase AROM in left shoulder region to WNL for increased ability to reach overhead at work. Long Term Goal 2 Progress: Progressing toward goal Long Term Goal 3: Patient will increase left shoulder strength to 5/5 for increased I with lifting bins at work. Long Term Goal 3 Progress: Progressing toward goal Long Term Goal 4: Patient will decrease left shoulder pain to 2/10 when completing household cleaning tasks. Long Term Goal 4 Progress: Progressing toward goal Long Term Goal 5: Patient will decrease fascial restrictions to minimal - trace in her left shoulder region. Long Term Goal 5 Progress: Progressing toward  goal  Problem List Patient Active Problem List   Diagnoses  . CHONDROMALACIA OF PATELLA  . POPLITEAL CYST, LEFT  . FOOT PAIN  . MEDIAL MENISCUS TEAR, LEFT  . Pain in joint, shoulder region  . Muscle weakness (generalized)  . Status post rotator cuff repair    End of Session Activity Tolerance: Patient tolerated treatment well General Behavior During Session: [facility] for tasks performed Cognition: [facility] for tasks performed  GO No functional reporting required  [name], OTS Occupational Therapy Student Note reviewed by clinical instructor and accurately reflects treatment session.  Shirlean Mylar, OTR/L  09/09/2011, 3:43 PM

## 2011-09-13 ENCOUNTER — Ambulatory Visit (HOSPITAL_COMMUNITY)
Admission: RE | Admit: 2011-09-13 | Discharge: 2011-09-13 | Disposition: A | Discharge status: Home / Self Care | Payer: BC Managed Care – PPO | Source: Ambulatory Visit | Attending: Family Medicine | Admitting: Family Medicine

## 2011-09-13 DIAGNOSIS — M6281 Muscle weakness (generalized): Secondary | ICD-10-CM | POA: Insufficient documentation

## 2011-09-13 DIAGNOSIS — IMO0001 Reserved for inherently not codable concepts without codable children: Secondary | ICD-10-CM | POA: Insufficient documentation

## 2011-09-13 DIAGNOSIS — M25519 Pain in unspecified shoulder: Secondary | ICD-10-CM | POA: Insufficient documentation

## 2011-09-13 DIAGNOSIS — Z9889 Other specified postprocedural states: Secondary | ICD-10-CM

## 2011-09-13 DIAGNOSIS — M25619 Stiffness of unspecified shoulder, not elsewhere classified: Secondary | ICD-10-CM | POA: Insufficient documentation

## 2011-09-13 NOTE — Progress Notes (Signed)
Occupational Therapy Treatment Patient Details  Name: Jo Hale MRN: [id number] Date of Birth: 1961/01/07  Today's Date: 09/13/2011 Time: 1030-1130 OT Time Calculation (min): 60 min Manual Therapy  1030-1055 25' Reassessment 1055-1100 5' Therapeutic Exercises 1100-1130 30' Visit#: 19  of 36   Re-eval: 10/11/11     Subjective Symptoms/Limitations Symptoms: S:  I can reach the front of my head a little bit easier now. Special Tests: DASH:  50 Pain Assessment Currently in Pain?: Yes Pain Score:   2 Pain Location: Shoulder Pain Orientation: Left Pain Type: Acute pain  Precautions/Restrictions   N/A  Exercise/Treatments Supine Protraction: PROM;AROM;10 reps Horizontal ABduction: PROM;AROM;10 reps External Rotation: PROM;AROM;10 reps Internal Rotation: PROM;AROM;10 reps Flexion: PROM;AROM;10 reps ABduction: PROM;AROM;10 reps Seated Elevation: AROM;15 reps Extension: AROM;15 reps Retraction: AROM;15 reps Row: AROM;15 reps Protraction: AROM;10 reps Horizontal ABduction: AROM;10 reps (facilitation to maintain proper positioning) External Rotation: AROM;10 reps Internal Rotation: AROM;10 reps Flexion: AROM;10 reps Abduction: AROM;10 reps (with facilitation to depress scapula) Other Seated Exercises: seated at table with left arm abducted in 90 on small therapy ball on table.  then completed ball stretch into abd x 15 with min facilitation to depress scapula and allow for scapular-humeral rhythm to take place. Pulleys Flexion: 2 minutes ABduction: 2 minutes Therapy Ball Flexion: 25 reps ABduction: 25 reps Right/Left: Other (comment) (add next visit) ROM / Strengthening / Isometric Strengthening Wall Wash: 3' with vg to maintain upright posture, increase time next visit Thumb Tacks: 1'15" Proximal Shoulder Strengthening, Supine: begin next visit Prot/Ret//Elev/Dep: 1'15"      Manual Therapy Manual Therapy: Myofascial release Myofascial Release: MFR and  manual stretching to left upper arm, scapular, levator scapulae, and shoulder region to decrease pain and restrictions and increase pain free mobility.  4098-1191  Occupational Therapy Assessment and Plan OT Assessment and Plan Clinical Impression Statement: A:  Please see MD monthly progress note for details.   OT Frequency: Min 3X/week OT Duration: 4 weeks OT Plan: P:  Continue to increase ability to maintain scapular depression while abducting and flexing her left arm.  Add ball circles, proximal shoulder strengthening.   Goals Short Term Goals Time to Complete Short Term Goals: Other (comment) (6 weeks) Short Term Goal 1: Patient will be educated on HEP. Short Term Goal 1 Progress: Met Short Term Goal 2: Patient will increase PROM to [facility] for increased ability to don stockings. Short Term Goal 2 Progress: Progressing toward goal Short Term Goal 3: Patient will increase left shoulder strength to 3+/5 for increased ability to reach and lift overhead. Short Term Goal 3 Progress: Progressing toward goal Short Term Goal 4: Patient will decrease pain to 4/10 during functional activities. Short Term Goal 4 Progress: Met Short Term Goal 5: Patient will decrease fascial restrictions to min-mod in her left shoulder region. Short Term Goal 5 Progress: Progressing toward goal Long Term Goals Time to Complete Long Term Goals: 12 weeks Long Term Goal 1: Patient will return to prior level of I with all B/IADLs, work, and leisure activities. Long Term Goal 1 Progress: Progressing toward goal Long Term Goal 2: Patient will increase AROM in left shoulder region to WNL for increased ability to reach overhead at work. Long Term Goal 2 Progress: Progressing toward goal Long Term Goal 3: Patient will increase left shoulder strength to 5/5 for increased I with lifting bins at work. Long Term Goal 3 Progress: Progressing toward goal Long Term Goal 4: Patient will decrease left shoulder pain to 2/10 when  completing  household cleaning tasks. Long Term Goal 4 Progress: Met Long Term Goal 5: Patient will decrease fascial restrictions to minimal - trace in her left shoulder region. Long Term Goal 5 Progress: Progressing toward goal  Problem List Patient Active Problem List  Diagnoses  . CHONDROMALACIA OF PATELLA  . POPLITEAL CYST, LEFT  . FOOT PAIN  . MEDIAL MENISCUS TEAR, LEFT  . Pain in joint, shoulder region  . Muscle weakness (generalized)  . Status post rotator cuff repair    End of Session Activity Tolerance: Patient tolerated treatment well General Behavior During Session: [facility] for tasks performed Cognition: [facility] for tasks performed OT Plan of Care OT Home Exercise Plan: dowel rod and AROM exercises in seated or supine.   Shirlean Mylar, OTR/L  09/13/2011, 11:39 AM

## 2011-09-14 ENCOUNTER — Ambulatory Visit (HOSPITAL_COMMUNITY)
Admission: RE | Admit: 2011-09-14 | Discharge: 2011-09-14 | Disposition: A | Discharge status: Home / Self Care | Payer: BC Managed Care – PPO | Source: Ambulatory Visit | Attending: Orthopedic Surgery | Admitting: Orthopedic Surgery

## 2011-09-14 DIAGNOSIS — Z9889 Other specified postprocedural states: Secondary | ICD-10-CM

## 2011-09-14 DIAGNOSIS — M25519 Pain in unspecified shoulder: Secondary | ICD-10-CM

## 2011-09-14 DIAGNOSIS — M6281 Muscle weakness (generalized): Secondary | ICD-10-CM

## 2011-09-14 NOTE — Progress Notes (Signed)
Occupational Therapy Treatment Patient Details  Name: Jo Hale MRN: [id number] Date of Birth: 1961/03/21  Today's Date: 09/14/2011 Time: 8295-6213 OT Time Calculation (min): 93 min Manual Therapy 0865-7846 30' Therapeutic Exercises 1335-1400 25' IFES with moist heat [id number] 15' Visit#: 20  of 36   Re-eval: 10/11/11    Subjective Symptoms/Limitations Symptoms: S:  I went to the MD and he really wants me to focus on this movement (external rotation). Limitations: Have not recieved updated orders signed progress note from MD, as appointment with MD was 09/13/11 - will modify treatment plan as needed once this information is recieved. Patient Stated Goals: I want to go back to work by 10/11/11. Pain Assessment Currently in Pain?: Yes Pain Score:   2 Pain Location: Shoulder Pain Orientation: Left Pain Type: Acute pain  Precautions/Restrictions   N/A  Exercise/Treatments Supine Protraction: PROM;AROM;10 reps Horizontal ABduction: PROM;AROM;10 reps External Rotation: PROM;AROM;10 reps Internal Rotation: PROM;AROM;10 reps Flexion: PROM;AROM;10 reps ABduction: PROM;AROM;10 reps Seated Protraction: AROM;10 reps;Limitations Protraction Limitations: requires tactile cue at superior shoulder to depress scapula, requires min facilitation to maintain positioning through full range of movement Horizontal ABduction: AROM;10 reps;Limitations Horizontal ABduction Limitations: requires mod facilitation to depress scapula External Rotation: AROM;10 reps;Limitations External Rotation Limitations: requires mod facilitation to depress scapula and maintain proper positioning of arm during exercise Internal Rotation: AROM;10 reps;Limitations Internal Rotation Limitations: requires mod facilitation to depress scapula and maintain proper arm positioning  Flexion: AROM;10 reps Abduction: AROM;10 reps;Limitations ABduction Limitations: requires mod facilitation to maintain scapular  depression while abducting arm and facilitation to maintain proper arm positioning. Other Seated Exercises: resume next visit Therapy Ball Flexion: 25 reps ABduction: 25 reps Right/Left: 5 reps ROM / Strengthening / Isometric Strengthening Wall Wash: resume next visit Thumb Tacks: resume next visit Proximal Shoulder Strengthening, Supine: begin next visit Prot/Ret//Elev/Dep: resume next visit   Stretches Corner Stretch: 1 rep;10 seconds Cross Chest Stretch: 1 rep;10 seconds Internal Rotation Stretch: Other (comment) (1 rep 10 seconds - modified to a low horizontal stretch at p) Wall Stretch - Flexion: 1 rep;10 seconds Wall Stretch - ABduction: 1 rep;10 seconds    Modalities Modalities: Electrical Stimulation;Moist Heat Manual Therapy Manual Therapy: Myofascial release Myofascial Release: MFR, deep tissue massage, manual stretching to left upper arm, scapular region, shoulder region, levator scapulae, and scar release to decrease pain and restrictions and increase pain free mobility.  [id number] Moist Heat Therapy Number Minutes Moist Heat: 15 Minutes Moist Heat Location: Shoulder Electrical Stimulation Electrical Stimulation Location: IFES to left shoulder to decrease pain . Electrical Stimulation Action: constant Electrical Stimulation Parameters: 11.5 Electrical Stimulation Goals: Pain  Occupational Therapy Assessment and Plan OT Assessment and Plan Clinical Impression Statement: A:  Patient requires mod facilitation and mod vg to maintain scapular depression while actively moving her LUE at shoulder level.  Added IFES with heat to relax shoulder. OT Plan: P:  Decrease tactile and verbal cuing needed to maintain scapular depression while actively moving her left shoulder during functional activities.  Resume exercises that were omitted and add proximal shoulder strengthening exercises.   Goals Short Term Goals Time to Complete Short Term Goals: Other (comment) (6  weeks) Short Term Goal 1: Patient will be educated on HEP. Short Term Goal 2: Patient will increase PROM to [facility] for increased ability to don stockings. Short Term Goal 3: Patient will increase left shoulder strength to 3+/5 for increased ability to reach and lift overhead. Short Term Goal 4: Patient will decrease pain to 4/10 during functional activities.  Short Term Goal 5: Patient will decrease fascial restrictions to min-mod in her left shoulder region. Long Term Goals Time to Complete Long Term Goals: 12 weeks Long Term Goal 1: Patient will return to prior level of I with all B/IADLs, work, and leisure activities. Long Term Goal 2: Patient will increase AROM in left shoulder region to WNL for increased ability to reach overhead at work. Long Term Goal 3: Patient will increase left shoulder strength to 5/5 for increased I with lifting bins at work. Long Term Goal 4: Patient will decrease left shoulder pain to 2/10 when completing household cleaning tasks. Long Term Goal 5: Patient will decrease fascial restrictions to minimal - trace in her left shoulder region.  Problem List Patient Active Problem List  Diagnoses  . CHONDROMALACIA OF PATELLA  . POPLITEAL CYST, LEFT  . FOOT PAIN  . MEDIAL MENISCUS TEAR, LEFT  . Pain in joint, shoulder region  . Muscle weakness (generalized)  . Status post rotator cuff repair    End of Session Activity Tolerance: Patient tolerated treatment well General Behavior During Session: [facility] for tasks performed Cognition: [facility] for tasks performed  GO No functional reporting required  Shirlean Mylar, OTR/L  09/14/2011, 3:43 PM

## 2011-09-16 ENCOUNTER — Ambulatory Visit (HOSPITAL_COMMUNITY)
Admission: RE | Admit: 2011-09-16 | Discharge: 2011-09-16 | Disposition: A | Discharge status: Home / Self Care | Payer: BC Managed Care – PPO | Source: Ambulatory Visit | Attending: Family Medicine | Admitting: Family Medicine

## 2011-09-16 NOTE — Progress Notes (Signed)
Occupational Therapy Treatment Patient Details  Name: Jo Hale MRN: [id number] Date of Birth: 04-06-1961  Today's Date: 09/16/2011 Time: 4098-1191 OT Time Calculation (min): 75 min  Manual Therapy:1305-1328 23'  Therapeutic Exercise: 4782-9562 37' Electrical Stimulation: 1405-1420 15' Visit#: 21  of 36   Re-eval: 10/11/11   Subjective Symptoms/Limitations Symptoms: S: I think the electrical stimulation and heat that we did last time really helped. I could move it more after and the pain was less for about a day.  Pain Assessment Currently in Pain?: Yes Pain Score:   1 Pain Location: Shoulder Pain Orientation: Left Pain Type: Acute pain  Precautions/Restrictions   N/A  Exercise/Treatments Supine Protraction: PROM;AROM;15 reps Horizontal ABduction: PROM;AROM;15 reps External Rotation: PROM;AROM;15 reps Internal Rotation: PROM;AROM;15 reps Flexion: PROM;AROM;15 reps ABduction: PROM;AROM;15 reps Seated Extension: Strengthening;10 reps;Theraband Theraband Level (Shoulder Extension): Level 2 (Red) Retraction: Strengthening;10 reps;Theraband Theraband Level (Shoulder Retraction): Level 2 (Red) Row: Strengthening;10 reps;Theraband Theraband Level (Shoulder Row): Level 2 (Red) Protraction: AROM;15 reps Horizontal ABduction: AROM;15 reps External Rotation: AROM;15 reps Internal Rotation: AROM;15 reps Flexion: AROM;15 reps Abduction: AROM;15 reps Other Seated Exercises: seated at table with left arm abducted in 90 on small therapy ball on table. then completed ball stretch into abd x 15 with min facilitation to depress scapula and allow for scapular-humeral rhythm to take place. Other Seated Exercises: shoulder abducted and internally rotated until end feel. Hold that position and flex/extend elbow to work on the fluidity of all 3 movements combined.   Therapy Ball Right/Left: 5 reps ROM / Strengthening / Isometric Strengthening Wall Wash: 3'       Manual  Therapy Manual Therapy: Myofascial release Myofascial Release: MFR and manual stretching to decrease pain and restrictions and increase pain free PROM in left shoulder to [facility] Moist Heat Therapy Number Minutes Moist Heat: 15 Minutes Moist Heat Location: Shoulder Electrical Stimulation Electrical Stimulation Location: IFES to left shoulder to decrease pain . Electrical Stimulation Action: constant Statistician Goals: Pain  Occupational Therapy Assessment and Plan OT Assessment and Plan Clinical Impression Statement: A: Pt demonstrated improvements in P/AROM and reports improvements in pain. Required less tactile and verbal cueing to maintain scapular depression when perofmring L arm movements. Increased duration of wall wash. Added shoulder abd/int rotation exercise requiring pt to hold shoulder abd/int rot while flexing/extending elbow.  OT Plan: Continue with IFES as pt reports it is benefitting her. Continue shoulder abd/int rot while flexing/extending elbow exercise to increase fluiding of those 3 movements combined.    Goals Short Term Goals Time to Complete Short Term Goals: Other (comment) (6 weeks) Short Term Goal 1: Patient will be educated on HEP. Short Term Goal 1 Progress: Met Short Term Goal 2: Patient will increase PROM to [facility] for increased ability to don stockings. Short Term Goal 2 Progress: Progressing toward goal Short Term Goal 3: Patient will increase left shoulder strength to 3+/5 for increased ability to reach and lift overhead. Short Term Goal 3 Progress: Progressing toward goal Short Term Goal 4: Patient will decrease pain to 4/10 during functional activities. Short Term Goal 4 Progress: Progressing toward goal Short Term Goal 5: Patient will decrease fascial restrictions to min-mod in her left shoulder region. Short Term Goal 5 Progress: Progressing toward goal Long Term Goals Time to Complete Long Term Goals: 12 weeks Long Term Goal 1: Patient will  return to prior level of I with all B/IADLs, work, and leisure activities. Long Term Goal 1 Progress: Progressing toward goal Long Term Goal 2:  Patient will increase AROM in left shoulder region to WNL for increased ability to reach overhead at work. Long Term Goal 2 Progress: Progressing toward goal Long Term Goal 3: Patient will increase left shoulder strength to 5/5 for increased I with lifting bins at work. Long Term Goal 3 Progress: Progressing toward goal Long Term Goal 4: Patient will decrease left shoulder pain to 2/10 when completing household cleaning tasks. Long Term Goal 4 Progress: Progressing toward goal Long Term Goal 5: Patient will decrease fascial restrictions to minimal - trace in her left shoulder region. Long Term Goal 5 Progress: Progressing toward goal  Problem List Patient Active Problem List  Diagnoses  . CHONDROMALACIA OF PATELLA  . POPLITEAL CYST, LEFT  . FOOT PAIN  . MEDIAL MENISCUS TEAR, LEFT  . Pain in joint, shoulder region  . Muscle weakness (generalized)  . Status post rotator cuff repair    End of Session Activity Tolerance: Patient tolerated treatment well General Behavior During Session: [facility] for tasks performed Cognition: [facility] for tasks performed  GO No functional reporting required  [name], OTS Occupational Therapy Student Note reviewed by clinical instructor and accurately reflects treatment session.  Shirlean Mylar, OTR/L  09/16/2011, 2:27 PM

## 2011-09-16 NOTE — Progress Notes (Signed)
Occupational Therapy Treatment Patient Details  Name: Jo Hale MRN: [id number] Date of Birth: 16-Oct-1960  Today's Date: 09/16/2011 Time: 4098-1191 OT Time Calculation (min): 75 min  Manual Therapy:1305-1328 23'  Therapeutic Exercise: 4782-9562 37' Electrical Stimulation: 1405-1420 15' Visit#: 21  of 36   Re-eval: 10/11/11   Subjective Symptoms/Limitations Symptoms: S: I think the electrical stimulation and heat that we did last time really helped. I could move it more after and the pain was less for about a day.  Pain Assessment Currently in Pain?: Yes Pain Score:   1 Pain Location: Shoulder Pain Orientation: Left Pain Type: Acute pain  Precautions/Restrictions   N/A  Exercise/Treatments Supine Protraction: PROM;AROM;15 reps Horizontal ABduction: PROM;AROM;15 reps External Rotation: PROM;AROM;15 reps Internal Rotation: PROM;AROM;15 reps Flexion: PROM;AROM;15 reps ABduction: PROM;AROM;15 reps Seated Extension: Strengthening;10 reps;Theraband Theraband Level (Shoulder Extension): Level 2 (Red) Retraction: Strengthening;10 reps;Theraband Theraband Level (Shoulder Retraction): Level 2 (Red) Row: Strengthening;10 reps;Theraband Theraband Level (Shoulder Row): Level 2 (Red) Protraction: AROM;15 reps Horizontal ABduction: AROM;15 reps External Rotation: AROM;15 reps Internal Rotation: AROM;15 reps Flexion: AROM;15 reps Abduction: AROM;15 reps Other Seated Exercises: seated at table with left arm abducted in 90 on small therapy ball on table. then completed ball stretch into abd x 15 with min facilitation to depress scapula and allow for scapular-humeral rhythm to take place. Other Seated Exercises: shoulder abducted and internally rotated until end feel. Hold that position and flex/extend elbow to work on the fluidity of all 3 movements combined.   Therapy Ball Right/Left: 5 reps ROM / Strengthening / Isometric Strengthening Wall Wash: 3'       Manual  Therapy Manual Therapy: Myofascial release Myofascial Release: MFR and manual stretching to decrease pain and restrictions and increase pain free PROM in left shoulder to [facility] Moist Heat Therapy Number Minutes Moist Heat: 15 Minutes Moist Heat Location: Shoulder Electrical Stimulation Electrical Stimulation Location: IFES to left shoulder to decrease pain . Electrical Stimulation Action: constant Statistician Goals: Pain  Occupational Therapy Assessment and Plan OT Assessment and Plan Clinical Impression Statement: A: Pt demonstrated improvements in P/AROM and reports improvements in pain. Required less tactile and verbal cueing to maintain scapular depression when perofmring L arm movements. Increased duration of wall wash. Added shoulder abd/int rotation exercise requiring pt to hold shoulder abd/int rot while flexing/extending elbow.  OT Plan: Continue with IFES as pt reports it is benefitting her. Continue shoulder abd/int rot while flexing/extending elbow exercise to increase fluiding of those 3 movements combined.    Goals Short Term Goals Time to Complete Short Term Goals: Other (comment) (6 weeks) Short Term Goal 1: Patient will be educated on HEP. Short Term Goal 1 Progress: Met Short Term Goal 2: Patient will increase PROM to [facility] for increased ability to don stockings. Short Term Goal 2 Progress: Progressing toward goal Short Term Goal 3: Patient will increase left shoulder strength to 3+/5 for increased ability to reach and lift overhead. Short Term Goal 3 Progress: Progressing toward goal Short Term Goal 4: Patient will decrease pain to 4/10 during functional activities. Short Term Goal 4 Progress: Progressing toward goal Short Term Goal 5: Patient will decrease fascial restrictions to min-mod in her left shoulder region. Short Term Goal 5 Progress: Progressing toward goal Long Term Goals Time to Complete Long Term Goals: 12 weeks Long Term Goal 1: Patient will  return to prior level of I with all B/IADLs, work, and leisure activities. Long Term Goal 1 Progress: Progressing toward goal Long Term Goal 2:  Patient will increase AROM in left shoulder region to WNL for increased ability to reach overhead at work. Long Term Goal 2 Progress: Progressing toward goal Long Term Goal 3: Patient will increase left shoulder strength to 5/5 for increased I with lifting bins at work. Long Term Goal 3 Progress: Progressing toward goal Long Term Goal 4: Patient will decrease left shoulder pain to 2/10 when completing household cleaning tasks. Long Term Goal 4 Progress: Progressing toward goal Long Term Goal 5: Patient will decrease fascial restrictions to minimal - trace in her left shoulder region. Long Term Goal 5 Progress: Progressing toward goal  Problem List Patient Active Problem List  Diagnoses  . CHONDROMALACIA OF PATELLA  . POPLITEAL CYST, LEFT  . FOOT PAIN  . MEDIAL MENISCUS TEAR, LEFT  . Pain in joint, shoulder region  . Muscle weakness (generalized)  . Status post rotator cuff repair    End of Session Activity Tolerance: Patient tolerated treatment well General Behavior During Session: [facility] for tasks performed Cognition: [facility] for tasks performed  GO No functional reporting required  [name], OTS Occupational Therapy Student  09/16/2011, 2:27 PM

## 2011-09-20 ENCOUNTER — Ambulatory Visit (HOSPITAL_COMMUNITY)
Admission: RE | Admit: 2011-09-20 | Discharge: 2011-09-20 | Disposition: A | Discharge status: Home / Self Care | Payer: BC Managed Care – PPO | Source: Ambulatory Visit | Attending: Family Medicine | Admitting: Family Medicine

## 2011-09-20 NOTE — Progress Notes (Signed)
Occupational Therapy Treatment Patient Details  Name: Jo Hale MRN: [id number] Date of Birth: 1960/07/14  Today's Date: 09/20/2011 Time: 6213-0865 OT Time Calculation (min): 78 min  Manual Therapy: [id number] 30' Therapeutic Exercise 5284-1324 33' IFES: 0907-0922 Visit#: 22  of 36   Re-eval: 10/11/11   Subjective Symptoms/Limitations Symptoms: S: I have been able to use my left arm more when I am closing the car door and fixing my hair. Pain Assessment Currently in Pain?: Yes Pain Score:   2 Pain Location: Shoulder Pain Orientation: Left Pain Type: Acute pain  Precautions/Restrictions   N/A  Exercise/Treatments Supine Protraction: PROM;AROM;15 reps Horizontal ABduction: PROM;AROM;15 reps External Rotation: PROM;AROM;15 reps Internal Rotation: PROM;AROM;15 reps Flexion: PROM;AROM;15 reps ABduction: PROM;AROM;15 reps Seated Extension: Theraband;10 reps (red) Retraction: Theraband;10 reps (red) Row: Theraband;10 reps (red) Protraction: AROM;15 reps Protraction Limitations: requires tactile cue at superior shoulder to depress scapula, requires min facilitation to maintain positioning through full range of movement Horizontal ABduction: AROM;15 reps Horizontal ABduction Limitations: requires mod facilitation to depress scapula External Rotation: AROM;15 reps;Theraband;10 reps (red) External Rotation Limitations: requires mod facilitation to depress scapula and maintain proper positioning of arm during exercise Internal Rotation: AROM;15 reps;Theraband;10 reps (red) Internal Rotation Limitations: requires mod facilitation to depress scapula and maintain proper arm positioning  Flexion: AROM;15 reps Abduction: AROM;15 reps ABduction Limitations: requires mod facilitation to maintain scapular depression while abducting arm and facilitation to maintain proper arm positioning. Other Seated Exercises: seated at table with left arm abducted in 90 on small therapy ball on  table. then completed ball stretch into abd x 15 with min facilitation to depress scapula and allow for scapular-humeral rhythm to take place. Other Seated Exercises: shoulder abducted and internally rotated until end feel. Hold that position and flex/extend elbow to work on the fluidity of all 3 movements combined.   Therapy Ball Flexion: 10 reps ABduction: 10 reps Right/Left: 5 reps ROM / Strengthening / Isometric Strengthening UBE (Upper Arm Bike): begin next visit Wall Wash: 4' Thumb Tacks: 1' "W" Arms: begin next visit X to V Arms: begin next visit Proximal Shoulder Strengthening, Supine: 10 Prot/Ret//Elev/Dep: 1'    Manual Therapy Manual Therapy: Myofascial release Myofascial Release: MFR and manual stretching to decrease pain and restrictions and increase pain free PROM in left shoulder.  Moderate restrictions noted in supraspinatus and levator scapulae  - completed by Leia Alf, OTR/L and [name], [place] [phone]   Occupational Therapy Assessment and Plan OT Assessment and Plan Clinical Impression Statement: A: Added proximal shoulder strengthening. Patient required less tactile cueing to maintain scapular depression during all shoulder exercises. Demonstrated increase ROM with shoulder abd/int rot exercise. OT Plan: P: Add UBE, X and X to V arms and strengthening in supine.   Goals Short Term Goals Time to Complete Short Term Goals: Other (comment) (6 weeks) Short Term Goal 1: Patient will be educated on HEP. Short Term Goal 1 Progress: Met Short Term Goal 2: Patient will increase PROM to [facility] for increased ability to don stockings. Short Term Goal 2 Progress: Progressing toward goal Short Term Goal 3: Patient will increase left shoulder strength to 3+/5 for increased ability to reach and lift overhead. Short Term Goal 3 Progress: Progressing toward goal Short Term Goal 4: Patient will decrease pain to 4/10 during functional activities. Short Term Goal 4 Progress:  Progressing toward goal Short Term Goal 5: Patient will decrease fascial restrictions to min-mod in her left shoulder region. Short Term Goal 5 Progress: Progressing toward goal Long Term  Goals Time to Complete Long Term Goals: 12 weeks Long Term Goal 1: Patient will return to prior level of I with all B/IADLs, work, and leisure activities. Long Term Goal 1 Progress: Progressing toward goal Long Term Goal 2: Patient will increase AROM in left shoulder region to WNL for increased ability to reach overhead at work. Long Term Goal 2 Progress: Progressing toward goal Long Term Goal 3: Patient will increase left shoulder strength to 5/5 for increased I with lifting bins at work. Long Term Goal 3 Progress: Progressing toward goal Long Term Goal 4: Patient will decrease left shoulder pain to 2/10 when completing household cleaning tasks. Long Term Goal 4 Progress: Progressing toward goal Long Term Goal 5: Patient will decrease fascial restrictions to minimal - trace in her left shoulder region. Long Term Goal 5 Progress: Progressing toward goal  Problem List Patient Active Problem List  Diagnoses  . CHONDROMALACIA OF PATELLA  . POPLITEAL CYST, LEFT  . FOOT PAIN  . MEDIAL MENISCUS TEAR, LEFT  . Pain in joint, shoulder region  . Muscle weakness (generalized)  . Status post rotator cuff repair    End of Session Activity Tolerance: Patient tolerated treatment well General Behavior During Session: [facility] for tasks performed Cognition: [facility] for tasks performed  GO No functional reporting required  Armandina Stammer, OTS Note reviewed by clinical instructor and accurately reflects treatment session. Shirlean Mylar, OTR/L   09/20/2011, 9:19 AM

## 2011-09-21 ENCOUNTER — Ambulatory Visit (HOSPITAL_COMMUNITY)
Admission: RE | Admit: 2011-09-21 | Discharge: 2011-09-21 | Disposition: A | Discharge status: Home / Self Care | Payer: BC Managed Care – PPO | Source: Ambulatory Visit | Attending: Family Medicine | Admitting: Family Medicine

## 2011-09-21 DIAGNOSIS — Z9889 Other specified postprocedural states: Secondary | ICD-10-CM

## 2011-09-21 DIAGNOSIS — M25519 Pain in unspecified shoulder: Secondary | ICD-10-CM

## 2011-09-21 DIAGNOSIS — M6281 Muscle weakness (generalized): Secondary | ICD-10-CM

## 2011-09-21 NOTE — Progress Notes (Signed)
Occupational Therapy Treatment Patient Details  Name: Jo Hale MRN: [id number] Date of Birth: [birth year]  Today's Date: 09/21/2011 Time: 0865-7846 OT Time Calculation (min): 65 min Manual Therapy 1021-1038 17' Therapeutic Exercise 1039-1110 30' IFES 9629-5284 15'  Visit#: 23  of 36   Re-eval: 10/11/11 Assessment Diagnosis: S/P Left RCR  Surgical Date: 07/20/11  Subjective Symptoms/Limitations Symptoms: S:  I get just a little twing every now and then, it is getting better. Pain Assessment Pain Score:   2 Pain Location: Shoulder Pain Orientation: Left Pain Type: Acute pain  Precautions/Restrictions  Precautions Precautions: Shoulder Type of Shoulder Precautions: PROM only through 5/21  Exercise/Treatments Supine Protraction: PROM;AROM;15 reps Horizontal ABduction: PROM;AROM;15 reps External Rotation: PROM;AROM;15 reps Internal Rotation: PROM;AROM;15 reps Flexion: PROM;AROM;15 reps ABduction: PROM;AROM;15 reps Seated Extension: Theraband;10 reps Theraband Level (Shoulder Extension): Level 3 (Green) Retraction: Theraband;10 reps Theraband Level (Shoulder Retraction): Level 3 (Green) Row: Theraband;10 reps Theraband Level (Shoulder Row): Level 3 (Green) Protraction: AROM;15 reps Protraction Limitations: completed in front of mirror to prevent hiking of shoulder  Horizontal ABduction: AROM;15 reps Horizontal ABduction Limitations: completed in front of mirror to increase independence with shoulder depression External Rotation: AROM;15 reps;Theraband;10 reps Theraband Level (Shoulder External Rotation): Level 3 (Green) External Rotation Limitations: completed in front of mirror to increase independence with shoulder depression Internal Rotation: AROM;15 reps;Theraband;10 reps Theraband Level (Shoulder Internal Rotation): Level 3 (Green) Internal Rotation Limitations: completed in front of mirror to increase independence with depressing shoulder Flexion:  AROM;15 reps Abduction: AROM;15 reps ABduction Limitations: completed in front of mirror to increase independence with depressing shoulder Other Seated Exercises: seated at table with left arm abducted in 90 on small therapy ball on table. then completed ball stretch into abd x 15 with min facilitation to depress scapula and allow for scapular-humeral rhythm to take place. Other Seated Exercises: shoulder abducted and internally rotated until end feel. Hold that position and flex/extend elbow to work on the fluidity of all 3 movements combined. Therapy Ball Flexion: 10 reps ABduction: 10 reps Right/Left: 5 reps ROM / Strengthening / Isometric Strengthening UBE (Upper Arm Bike): unavailable, begin next visit Wall Wash: 4' Thumb Tacks: 1' "W" Arms: x10 X to V Arms: x10 Proximal Shoulder Strengthening, Supine: 20 Prot/Ret//Elev/Dep: 1'           Modalities Modalities: Electrical Stimulation Manual Therapy Manual Therapy: Myofascial release Myofascial Release: MFR and manual stretching to left upper trap, scapula region and upper arm with goal to decrease restrictions to decrease pain and increase A/PROM. Pharmacologist Location: IFES to left shoulder to decrease pain . Electrical Stimulation Action: constant Electrical Stimulation Parameters: hi/low Control and instrumentation engineer Goals: Pain  Occupational Therapy Assessment and Plan OT Assessment and Plan Clinical Impression Statement: A:  Added x to v and w arms which patient stated she felt a good stretch and made her a bit sore.  Increased to green band which patient tolerated well and felt was easy. Rehab Potential: Excellent OT Plan: P:  Add UBE and 1# to supine.   Goals Short Term Goals Time to Complete Short Term Goals: Other (comment) (6 weeks) Short Term Goal 1: Patient will be educated on HEP. Short Term Goal 2: Patient will increase PROM to [facility] for increased ability to don  stockings. Short Term Goal 3: Patient will increase left shoulder strength to 3+/5 for increased ability to reach and lift overhead. Short Term Goal 4: Patient will decrease pain to 4/10 during functional activities. Short Term Goal 5: Patient  will decrease fascial restrictions to min-mod in her left shoulder region. Long Term Goals Time to Complete Long Term Goals: 12 weeks Long Term Goal 1: Patient will return to prior level of I with all B/IADLs, work, and leisure activities. Long Term Goal 2: Patient will increase AROM in left shoulder region to WNL for increased ability to reach overhead at work. Long Term Goal 3: Patient will increase left shoulder strength to 5/5 for increased I with lifting bins at work. Long Term Goal 4: Patient will decrease left shoulder pain to 2/10 when completing household cleaning tasks. Long Term Goal 5: Patient will decrease fascial restrictions to minimal - trace in her left shoulder region.  Problem List Patient Active Problem List  Diagnoses  . CHONDROMALACIA OF PATELLA  . POPLITEAL CYST, LEFT  . FOOT PAIN  . MEDIAL MENISCUS TEAR, LEFT  . Pain in joint, shoulder region  . Muscle weakness (generalized)  . Status post rotator cuff repair    End of Session Activity Tolerance: Patient tolerated treatment well General Behavior During Session: Jo Hale for tasks performed Cognition: [facility] for tasks performed  GO No functional reporting required  Jo Hale, COTA/L  09/21/2011, 1:34 PM

## 2011-09-24 ENCOUNTER — Ambulatory Visit (HOSPITAL_COMMUNITY)
Admission: RE | Admit: 2011-09-24 | Discharge: 2011-09-24 | Disposition: A | Discharge status: Home / Self Care | Payer: BC Managed Care – PPO | Source: Ambulatory Visit | Attending: Family Medicine | Admitting: Family Medicine

## 2011-09-24 DIAGNOSIS — M6281 Muscle weakness (generalized): Secondary | ICD-10-CM

## 2011-09-24 DIAGNOSIS — M25519 Pain in unspecified shoulder: Secondary | ICD-10-CM

## 2011-09-24 DIAGNOSIS — Z9889 Other specified postprocedural states: Secondary | ICD-10-CM

## 2011-09-24 NOTE — Progress Notes (Signed)
Occupational Therapy Treatment Patient Details  Name: Jo Hale MRN: [id number] Date of Birth: May 03, 1960  Today's Date: 09/24/2011 Time: 1308-6578 OT Time Calculation (min): 51 min Manual Therapy [phone] 22' Therapeutic Exercise 1007-1035 28'  Visit#: 24  of 36   Re-eval: 10/11/11 Assessment Diagnosis: S/P Left RCR    Subjective Symptoms/Limitations Symptoms: S: I think I am doing too much, it feels swollen.  I was driving all day yesterday, that may have done it.  Precautions/Restrictions  Precautions Precautions: Shoulder Type of Shoulder Precautions: PROM only through 5/21  Exercise/Treatments Supine Protraction: PROM;AROM;15 reps Horizontal ABduction: PROM;AROM;15 reps External Rotation: PROM;AROM;15 reps Internal Rotation: PROM;AROM;15 reps Flexion: PROM;AROM;15 reps ABduction: PROM;AROM;15 reps Seated Extension: Theraband;10 reps Theraband Level (Shoulder Extension): Level 3 (Green) Retraction: Theraband;10 reps Theraband Level (Shoulder Retraction): Level 3 (Green) Row: Theraband;10 reps Theraband Level (Shoulder Row): Level 3 (Green) Protraction: AROM;15 reps Protraction Limitations: completed in front of mirror to prevent hiking of shoulder  Horizontal ABduction: AROM;15 reps Horizontal ABduction Limitations: completed in front of mirror to increase independence with shoulder depression External Rotation: AROM;15 reps;Theraband;10 reps Theraband Level (Shoulder External Rotation): Level 3 (Green) External Rotation Limitations: completed in front of mirror to increase independence with shoulder depression Internal Rotation: AROM;15 reps;Theraband;10 reps Theraband Level (Shoulder Internal Rotation): Level 3 (Green) Internal Rotation Limitations: completed in front of mirror to increase independence with depressing shoulder Flexion: AROM;15 reps Abduction: AROM;15 reps ABduction Limitations: completed in front of mirror to increase independence  with depressing shoulder Other Seated Exercises: seated at table with left arm abducted in 90 on small therapy ball on table. then completed ball stretch into abd x 15 with min facilitation to depress scapula and allow for scapular-humeral rhythm to take place. Other Seated Exercises: shoulder abducted and internally rotated until end feel. Hold that position and flex/extend elbow to work on the fluidity of all 3 movements combined. Therapy Ball Flexion: 10 reps ABduction: 10 reps Right/Left: 5 reps ROM / Strengthening / Isometric Strengthening UBE (Upper Arm Bike): 2' forward 2' backwards 1.0 Wall Wash: 4' Thumb Tacks: 1' "W" Arms: x10 X to V Arms: x10 Proximal Shoulder Strengthening, Supine: 20 Prot/Ret//Elev/Dep: 1'      Manual Therapy Manual Therapy: Myofascial release Myofascial Release: MFR and manual stretching to left upper trap, scapula region and upper arm with goal to decrease restrictions to decrease pain and increase A/PROM  Occupational Therapy Assessment and Plan OT Assessment and Plan Clinical Impression Statement: A:  Added UBE.  Did not increase any reps or resistance secondary to coming in with c/o increased swelling and stiffness.  Patient stated she felt much looser and  had decreased pain after treatment today. OT Plan: P:  Add 1# to supine and increase reps as patient tolerates.   Goals Short Term Goals Time to Complete Short Term Goals: Other (comment) (6 weeks) Short Term Goal 1: Patient will be educated on HEP. Short Term Goal 2: Patient will increase PROM to [facility] for increased ability to don stockings. Short Term Goal 3: Patient will increase left shoulder strength to 3+/5 for increased ability to reach and lift overhead. Short Term Goal 4: Patient will decrease pain to 4/10 during functional activities. Short Term Goal 5: Patient will decrease fascial restrictions to min-mod in her left shoulder region. Long Term Goals Time to Complete Long Term Goals:  12 weeks Long Term Goal 1: Patient will return to prior level of I with all B/IADLs, work, and leisure activities. Long Term Goal 2: Patient will  increase AROM in left shoulder region to WNL for increased ability to reach overhead at work. Long Term Goal 3: Patient will increase left shoulder strength to 5/5 for increased I with lifting bins at work. Long Term Goal 4: Patient will decrease left shoulder pain to 2/10 when completing household cleaning tasks. Long Term Goal 5: Patient will decrease fascial restrictions to minimal - trace in her left shoulder region.  Problem List Patient Active Problem List  Diagnosis  . CHONDROMALACIA OF PATELLA  . POPLITEAL CYST, LEFT  . FOOT PAIN  . MEDIAL MENISCUS TEAR, LEFT  . Pain in joint, shoulder region  . Muscle weakness (generalized)  . Status post rotator cuff repair    End of Session Activity Tolerance: Patient tolerated treatment well General Behavior During Session: [facility] for tasks performed Cognition: [facility] for tasks performed  GO No functional reporting required   Marili Vader L. Prisilla Kocsis, COTA/L  09/24/2011, 12:11 PM

## 2011-09-27 ENCOUNTER — Ambulatory Visit (HOSPITAL_COMMUNITY)
Admission: RE | Admit: 2011-09-27 | Discharge: 2011-09-27 | Disposition: A | Discharge status: Home / Self Care | Payer: BC Managed Care – PPO | Source: Ambulatory Visit | Attending: Family Medicine | Admitting: Family Medicine

## 2011-09-27 DIAGNOSIS — M6281 Muscle weakness (generalized): Secondary | ICD-10-CM

## 2011-09-27 DIAGNOSIS — M25519 Pain in unspecified shoulder: Secondary | ICD-10-CM

## 2011-09-27 DIAGNOSIS — Z9889 Other specified postprocedural states: Secondary | ICD-10-CM

## 2011-09-27 NOTE — Progress Notes (Signed)
Occupational Therapy Treatment Patient Details  Name: THANH POMERLEAU MRN: [id number] Date of Birth: October 15, 1960  Today's Date: 09/27/2011 Time: 0539-7673 OT Time Calculation (min): 67 min Manual Therapy [phone] 24' Therapeutic Exercise 910-953 61'  Visit#: 25  of 36   Re-eval: 10/11/11 Assessment Diagnosis: S/P Left RCR    Subjective Symptoms/Limitations Symptoms: S:  It still hurts most at night. Pain Assessment Currently in Pain?: Yes Pain Score:   2 Pain Location: Shoulder Pain Orientation: Left  Precautions/Restrictions     Exercise/Treatments Supine Protraction: PROM;Strengthening;10 reps Horizontal ABduction: PROM;Strengthening;10 reps External Rotation: PROM;Strengthening;10 reps Internal Rotation: PROM;Strengthening;10 reps Flexion: PROM;Strengthening;10 reps ABduction: PROM;Strengthening;10 reps Seated Extension: Theraband;15 reps Theraband Level (Shoulder Extension): Level 3 (Green) Retraction: Theraband;15 reps Theraband Level (Shoulder Retraction): Level 3 (Green) Row: Theraband Theraband Level (Shoulder Row): Level 3 (Green) Protraction: AROM;15 reps Protraction Limitations: completed in front of mirror to prevent hiking of shoulder  Horizontal ABduction: AROM;15 reps Horizontal ABduction Limitations: completed in front of mirror to increase independence with shoulder depression External Rotation: AROM;15 reps;Theraband;10 reps Theraband Level (Shoulder External Rotation): Level 3 (Green) External Rotation Limitations: completed in front of mirror to increase independence with shoulder depression Internal Rotation: AROM;15 reps;Theraband;10 reps Theraband Level (Shoulder Internal Rotation): Level 3 (Green) Internal Rotation Limitations: completed in front of mirror to increase independence with depressing shoulder Flexion: AROM;15 reps Abduction: AROM;15 reps ABduction Limitations: completed in front of mirror to increase independence with  depressing shoulder Other Seated Exercises: seated at table with left arm abducted in 90 on small therapy ball on table. then completed ball stretch into abd x 15 with min facilitation to depress scapula and allow for scapular-humeral rhythm to take place. Other Seated Exercises: shoulder abducted and internally rotated until end feel. Hold that position and flex/extend elbow to work on the fluidity of all 3 movements combined. Therapy Ball Flexion: 20 reps ABduction: 20 reps Right/Left: 5 reps ROM / Strengthening / Isometric Strengthening UBE (Upper Arm Bike): 3' forward 3' backwards Wall Wash: 3' with 1# Thumb Tacks: 1' "W" Arms: x10 X to V Arms: x10 Proximal Shoulder Strengthening, Supine: x20 with 1# Prot/Ret//Elev/Dep: 1'       Manual Therapy Manual Therapy: Myofascial release Myofascial Release: MFR and manual stretching to left upper trap, scapula region and upper arm with goal to decrease restrictions to decrease pain and increase A/PROM   Occupational Therapy Assessment and Plan OT Assessment and Plan Clinical Impression Statement: A:  Added 1# to supine exercises, 1# to wall wash and increased UBE to 3' forward and 3' backward. No increased with resistance with seated secondary to patient still limited with seated AROM vs much more AROM in supine.  Reviewed HEP stretches and educated on good form and position for optimal stretches secondary to patient performing some of the stretches incorrectly. OT Plan: P:  Increase reps with supine ex and time with wall wash.   Goals Short Term Goals Time to Complete Short Term Goals: Other (comment) (6 weeks) Short Term Goal 1: Patient will be educated on HEP. Short Term Goal 2: Patient will increase PROM to [facility] for increased ability to don stockings. Short Term Goal 3: Patient will increase left shoulder strength to 3+/5 for increased ability to reach and lift overhead. Short Term Goal 4: Patient will decrease pain to 4/10 during  functional activities. Short Term Goal 5: Patient will decrease fascial restrictions to min-mod in her left shoulder region. Long Term Goals Time to Complete Long Term Goals: 12 weeks Long Term  Goal 1: Patient will return to prior level of I with all B/IADLs, work, and leisure activities. Long Term Goal 2: Patient will increase AROM in left shoulder region to WNL for increased ability to reach overhead at work. Long Term Goal 3: Patient will increase left shoulder strength to 5/5 for increased I with lifting bins at work. Long Term Goal 4: Patient will decrease left shoulder pain to 2/10 when completing household cleaning tasks. Long Term Goal 5: Patient will decrease fascial restrictions to minimal - trace in her left shoulder region.  Problem List Patient Active Problem List  Diagnosis  . CHONDROMALACIA OF PATELLA  . POPLITEAL CYST, LEFT  . FOOT PAIN  . MEDIAL MENISCUS TEAR, LEFT  . Pain in joint, shoulder region  . Muscle weakness (generalized)  . Status post rotator cuff repair    End of Session Activity Tolerance: Patient tolerated treatment well General Behavior During Session: [facility] for tasks performed Cognition: [facility] for tasks performed OT Plan of Care OT Home Exercise Plan: reviewed HEP stretches.  GO No functional reporting required   Edlyn Rosenburg L. Jaylynne Birkhead, COTA/L  09/27/2011, 10:04 AM

## 2011-09-28 ENCOUNTER — Ambulatory Visit (HOSPITAL_COMMUNITY)
Admission: RE | Admit: 2011-09-28 | Discharge: 2011-09-28 | Disposition: A | Discharge status: Home / Self Care | Payer: BC Managed Care – PPO | Source: Ambulatory Visit | Attending: Family Medicine | Admitting: Family Medicine

## 2011-09-28 DIAGNOSIS — Z9889 Other specified postprocedural states: Secondary | ICD-10-CM

## 2011-09-28 DIAGNOSIS — M6281 Muscle weakness (generalized): Secondary | ICD-10-CM

## 2011-09-28 DIAGNOSIS — M25519 Pain in unspecified shoulder: Secondary | ICD-10-CM

## 2011-09-28 NOTE — Progress Notes (Signed)
Occupational Therapy Treatment Patient Details  Name: Jo Hale MRN: [id number] Date of Birth: 12-Aug-1960  Today's Date: 09/28/2011 Time: 4098-1191 OT Time Calculation (min): 54 min Manual Therapy [phone] 28' Therapeutic Exercise [phone] 25'  Visit#: 26  of 36   Re-eval: 10/11/11    Subjective Symptoms/Limitations Symptoms: S:  It hurts when I exercise. Pain Assessment Pain Score:   2 Pain Location: Shoulder Pain Orientation: Left Pain Type: Acute pain  Precautions/Restrictions     Exercise/Treatments Supine Protraction: PROM;Strengthening;12 reps Protraction Weight (lbs): 1 Horizontal ABduction: PROM;Strengthening;12 reps Horizontal ABduction Weight (lbs): 1 External Rotation: PROM;Strengthening;12 reps External Rotation Weight (lbs): 1 Internal Rotation: PROM;Strengthening;12 reps Internal Rotation Weight (lbs): 1 Flexion: PROM;Strengthening;12 reps Shoulder Flexion Weight (lbs): 1 ABduction: PROM;Strengthening;12 reps Shoulder ABduction Weight (lbs): 1 Seated Extension: Theraband;15 reps Theraband Level (Shoulder Extension): Level 3 (Green) Retraction: Theraband;15 reps Theraband Level (Shoulder Retraction): Level 3 (Green) Row: Theraband Theraband Level (Shoulder Row): Level 3 (Green) Protraction: AROM;15 reps Protraction Limitations: completed in front of mirror to prevent hiking of shoulder  Horizontal ABduction: AROM;15 reps Horizontal ABduction Limitations: completed in front of mirror to increase independence with shoulder depression External Rotation: AROM;15 reps;Theraband;10 reps Theraband Level (Shoulder External Rotation): Level 3 (Green) External Rotation Limitations: completed in front of mirror to increase independence with shoulder depression Internal Rotation: AROM;15 reps;Theraband;10 reps Theraband Level (Shoulder Internal Rotation): Level 3 (Green) Internal Rotation Limitations: completed in front of mirror to increase independence  with depressing shoulder Flexion: AROM;15 reps Abduction: AROM;15 reps ABduction Limitations: completed in front of mirror to increase independence with depressing shoulder Other Seated Exercises: seated at table with left arm abducted in 90 on small therapy ball on table. then completed ball stretch into abd x 15 with min facilitation to depress scapula and allow for scapular-humeral rhythm to take place. Other Seated Exercises: shoulder abducted and internally rotated until end feel. Hold that position and flex/extend elbow to work on the fluidity of all 3 movements combined. Therapy Ball Flexion: 20 reps ABduction: 20 reps Right/Left: 5 reps ROM / Strengthening / Isometric Strengthening UBE (Upper Arm Bike): 3' forward 3' backwards Wall Wash: 3' with 1# Thumb Tacks: 1' "W" Arms: x10 X to V Arms: x10 Proximal Shoulder Strengthening, Seated: x20 Prot/Ret//Elev/Dep: 1'           Manual Therapy Manual Therapy: Myofascial release Myofascial Release: MFR and manual stretching to left upper trap, scapula region and upper arm with goal to decrease restrictions to decrease pain and increase A/PROM   Occupational Therapy Assessment and Plan OT Assessment and Plan Clinical Impression Statement: A:  Switched proximal shoulder strengthening from supine to seated.  Visual and tactile cues to keep shoulder depressed with seated AROM. OT Plan: P:  Increase time with wall wash.   Goals Short Term Goals Time to Complete Short Term Goals: Other (comment) (6 weeks) Short Term Goal 1: Patient will be educated on HEP. Short Term Goal 2: Patient will increase PROM to [facility] for increased ability to don stockings. Short Term Goal 3: Patient will increase left shoulder strength to 3+/5 for increased ability to reach and lift overhead. Short Term Goal 4: Patient will decrease pain to 4/10 during functional activities. Short Term Goal 5: Patient will decrease fascial restrictions to min-mod in her left  shoulder region. Long Term Goals Time to Complete Long Term Goals: 12 weeks Long Term Goal 1: Patient will return to prior level of I with all B/IADLs, work, and leisure activities. Long Term Goal 2:  Patient will increase AROM in left shoulder region to WNL for increased ability to reach overhead at work. Long Term Goal 3: Patient will increase left shoulder strength to 5/5 for increased I with lifting bins at work. Long Term Goal 4: Patient will decrease left shoulder pain to 2/10 when completing household cleaning tasks. Long Term Goal 5: Patient will decrease fascial restrictions to minimal - trace in her left shoulder region.  Problem List Patient Active Problem List  Diagnosis  . CHONDROMALACIA OF PATELLA  . POPLITEAL CYST, LEFT  . FOOT PAIN  . MEDIAL MENISCUS TEAR, LEFT  . Pain in joint, shoulder region  . Muscle weakness (generalized)  . Status post rotator cuff repair    End of Session Activity Tolerance: Patient tolerated treatment well General Behavior During Session: [facility] for tasks performed Cognition: [facility] for tasks performed  GO No functional reporting required   Allessandra Bernardi L. Ahonesty Woodfin, COTA/L  09/28/2011, 9:55 AM

## 2011-09-29 ENCOUNTER — Ambulatory Visit (HOSPITAL_COMMUNITY): Payer: BC Managed Care – PPO | Admitting: Specialist

## 2011-10-01 ENCOUNTER — Ambulatory Visit (HOSPITAL_COMMUNITY)
Admission: RE | Admit: 2011-10-01 | Discharge: 2011-10-01 | Disposition: A | Discharge status: Home / Self Care | Payer: BC Managed Care – PPO | Source: Ambulatory Visit | Attending: Family Medicine | Admitting: Family Medicine

## 2011-10-01 DIAGNOSIS — Z9889 Other specified postprocedural states: Secondary | ICD-10-CM

## 2011-10-01 DIAGNOSIS — M25519 Pain in unspecified shoulder: Secondary | ICD-10-CM

## 2011-10-01 DIAGNOSIS — M6281 Muscle weakness (generalized): Secondary | ICD-10-CM

## 2011-10-01 NOTE — Progress Notes (Signed)
Occupational Therapy Treatment Patient Details  Name: Jo Hale MRN: [id number] Date of Birth: 1960-07-28  Today's Date: 10/01/2011 Time: 4098-1191 OT Time Calculation (min): 80 min Manual Therapy [phone] 28' Therapeutic Exercise 1005-1045 30' Ice 10'  Visit#: 98  of 36   Re-eval: 10/08/11 (for MD appointment on 7/1) Assessment Diagnosis: S/P Left RCR    Subjective Symptoms/Limitations Symptoms: S:  Still mostly hurts at night,  Pain Assessment Pain Score:   2 Pain Location: Shoulder Pain Orientation: Left Pain Type: Acute pain  Precautions/Restrictions     Exercise/Treatments Supine Protraction: PROM;Strengthening;15 reps Protraction Weight (lbs): 1 Horizontal ABduction: PROM;Strengthening;15 reps Horizontal ABduction Weight (lbs): 1 External Rotation: PROM;Strengthening;15 reps External Rotation Weight (lbs): 1 Internal Rotation: PROM;Strengthening;15 reps Internal Rotation Weight (lbs): 1 Flexion: PROM;Strengthening;15 reps Shoulder Flexion Weight (lbs): 1 ABduction: PROM;Strengthening;15 reps Shoulder ABduction Weight (lbs): 1 Seated Extension: Theraband;15 reps Theraband Level (Shoulder Extension): Level 3 (Green) Retraction: Theraband;15 reps Theraband Level (Shoulder Retraction): Level 3 (Green) Row: Theraband;15 reps Theraband Level (Shoulder Row): Level 3 (Green) Protraction: AROM;20 reps Protraction Limitations: completed in front of mirror to prevent hiking of shoulder  Horizontal ABduction: AROM;20 reps Horizontal ABduction Limitations: completed in front of mirror to increase independence with shoulder depression External Rotation: AROM;20 reps Theraband Level (Shoulder External Rotation): Level 3 (Green) External Rotation Limitations: completed in front of mirror to increase independence with shoulder depression Internal Rotation: AROM;20 reps Theraband Level (Shoulder Internal Rotation): Level 3 (Green) Internal Rotation Limitations:  completed in front of mirror to increase independence with depressing shoulder Flexion: AROM;20 reps;Other (comment) Flexion Limitations: completed in front of mirror to prevent shoulder hiking. Abduction: AROM;15 reps ABduction Limitations: completed in front of mirror to increase independence with depressing shoulder Other Seated Exercises: seated at table with left arm abducted in 90 on small therapy ball on table. then completed ball stretch into abd x 15 with min facilitation to depress scapula and allow for scapular-humeral rhythm to take place. Other Seated Exercises: shoulder abducted and internally rotated until end feel. Hold that position and flex/extend elbow to work on the fluidity of all 3 movements combined. Therapy Ball Flexion: 20 reps ABduction: 20 reps Right/Left: 5 reps ROM / Strengthening / Isometric Strengthening UBE (Upper Arm Bike): 3' forward 3' backwards 2.0 Wall Wash: 4' with 1# Thumb Tacks: 1' "W" Arms: x10 X to V Arms: x10 Proximal Shoulder Strengthening, Seated: x20 Prot/Ret//Elev/Dep: 1' Other ROM/Strengthening Exercises: walk arm up the wall then lift off wall x 5 flexion and abduction   Stretches External Rotation Stretch: 5 reps;10 seconds       Modalities Modalities: Cryotherapy Manual Therapy Manual Therapy: Myofascial release Myofascial Release: MFR and manual stretching to left upper trap, scapula region and upper arm with goal to decrease restrictions to decrease pain and increase A/PROM  Cryotherapy Number Minutes Cryotherapy: 10 Minutes Cryotherapy Location: Shoulder Type of Cryotherapy: Ice pack  Occupational Therapy Assessment and Plan OT Assessment and Plan Clinical Impression Statement: A:  Added new wall exercise (see flow sheet for details) to increase active abduction and flexion. Rehab Potential: Excellent OT Plan: P: Keep seated reps to 20, no increase in resistance secondary to still having compensatory movement to make up  for lack of ROM.   Goals Short Term Goals Time to Complete Short Term Goals: Other (comment) (6 weeks) Short Term Goal 1: Patient will be educated on HEP. Short Term Goal 2: Patient will increase PROM to [facility] for increased ability to don stockings. Short Term Goal 3: Patient  will increase left shoulder strength to 3+/5 for increased ability to reach and lift overhead. Short Term Goal 4: Patient will decrease pain to 4/10 during functional activities. Short Term Goal 5: Patient will decrease fascial restrictions to min-mod in her left shoulder region. Long Term Goals Time to Complete Long Term Goals: 12 weeks Long Term Goal 1: Patient will return to prior level of I with all B/IADLs, work, and leisure activities. Long Term Goal 2: Patient will increase AROM in left shoulder region to WNL for increased ability to reach overhead at work. Long Term Goal 3: Patient will increase left shoulder strength to 5/5 for increased I with lifting bins at work. Long Term Goal 4: Patient will decrease left shoulder pain to 2/10 when completing household cleaning tasks. Long Term Goal 5: Patient will decrease fascial restrictions to minimal - trace in her left shoulder region.  Problem List Patient Active Problem List  Diagnosis  . CHONDROMALACIA OF PATELLA  . POPLITEAL CYST, LEFT  . FOOT PAIN  . MEDIAL MENISCUS TEAR, LEFT  . Pain in joint, shoulder region  . Muscle weakness (generalized)  . Status post rotator cuff repair    End of Session Activity Tolerance: Patient tolerated treatment well General Behavior During Session: [facility] for tasks performed Cognition: [facility] for tasks performed OT Plan of Care OT Home Exercise Plan: Added green tband with written handout to HEP.  Educated by demonstration and return demonstration.  GO No functional reporting required   Keyleen Cerrato L. Shakyra Mattera, COTA/L  10/01/2011, 10:55 AM

## 2011-10-04 ENCOUNTER — Ambulatory Visit (HOSPITAL_COMMUNITY)
Admission: RE | Admit: 2011-10-04 | Discharge: 2011-10-04 | Disposition: A | Discharge status: Home / Self Care | Payer: BC Managed Care – PPO | Source: Ambulatory Visit | Attending: Family Medicine | Admitting: Family Medicine

## 2011-10-04 DIAGNOSIS — M6281 Muscle weakness (generalized): Secondary | ICD-10-CM

## 2011-10-04 DIAGNOSIS — Z9889 Other specified postprocedural states: Secondary | ICD-10-CM

## 2011-10-04 DIAGNOSIS — M25519 Pain in unspecified shoulder: Secondary | ICD-10-CM

## 2011-10-04 NOTE — Progress Notes (Signed)
Occupational Therapy Treatment Patient Details  Name: Jo Hale MRN: [id number] Date of Birth: 12/27/1960  Today's Date: 10/04/2011 Time: 7829-5621 OT Time Calculation (min): 70 min Manual Therapy [phone] 24' Therapeutic Exercises [phone] 41' Visit#: 28  of 36   Re-eval: 10/08/11    Subjective Symptoms/Limitations Symptoms: S:  I feel like Im under the gun for going back to work. Limitations: Discussed usual length of rehab s/p RCR per protocol is 12 weeks, can be longer based on patient's progress, as every patient's therapy course is individualized. Pain Assessment Currently in Pain?: Yes Pain Score:   1 Pain Location: Shoulder Pain Orientation: Left Pain Type: Acute pain  Precautions/Restrictions   N/A  Exercise/Treatments Supine Protraction: PROM;Strengthening;15 reps Protraction Weight (lbs): 1 Horizontal ABduction: PROM;Strengthening;15 reps Horizontal ABduction Weight (lbs): 1 External Rotation: PROM;Strengthening;15 reps (discussed expected AROM (= to R), patient lacks 5 degrees) External Rotation Weight (lbs): 1 Internal Rotation: PROM;Strengthening;15 reps Internal Rotation Weight (lbs): 1 Flexion: PROM;Strengthening;15 reps Shoulder Flexion Weight (lbs): 1 ABduction: PROM;Strengthening;15 reps Shoulder ABduction Weight (lbs): 1 Seated Elevation: 15 reps;Theraband (green depression) Extension: Theraband;15 reps (green with vg to depress shoulder blade) Retraction: Theraband;15 reps (green with vg to depress shoulder blade) Row: Theraband;15 reps (with vg to depress shoulder blade) Protraction: AROM;15 reps Horizontal ABduction: AROM;15 reps External Rotation: AROM;Theraband;15 reps ( AROMwith shoulder abducted to 90 for functional positioning) Internal Rotation: AROM;Theraband;15 reps (AROM with shoulder abd to 90 for functional positioning) Flexion: AROM;15 reps Abduction: AROM;15 reps Other Seated Exercises: dc Other Seated Exercises: shoulder  abducted and internally rotated until end feel. 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  [phone]  Occupational Therapy Assessment and Plan OT Assessment and Plan Clinical Impression Statement: A:  Mrs. Stong's external rotation with shoulder abd to 90 is near [facility] (approximately 5-10 degrees off of RUE).  She continues to require tactile and verbal cuing to depress shoulder blade when flexing or abducting above 90.  We discssed ways to strengthen the scapula depressors and consciously thinking about depressing her scapula with all activities above 90 degrees.  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  Decreased reps to 15 with seated AROM to allow for time for other exercises.  OT Plan: P:   Decrease amount of faciliation required to depress shoulder, focus on increasing range above 90 without elevating her scapula.   Goals Short Term Goals Time to Complete Short Term Goals: Other (comment) (6 weeks) Short Term Goal 1: Patient will be educated  on HEP. Short Term Goal 2: Patient will increase PROM to [facility] for increased ability to don stockings. Short Term Goal 2 Progress:  Met Short Term Goal 3: Patient will increase left shoulder strength to 3+/5 for increased ability to reach and lift overhead. Short Term Goal 3 Progress: Progressing toward goal Short Term Goal 4: Patient will decrease pain to 4/10 during functional activities. Short Term Goal 4 Progress: Progressing toward goal Short Term Goal 5: Patient will decrease fascial restrictions to min-mod in her left shoulder region. Short Term Goal 5 Progress: Progressing toward goal Long Term Goals Time to Complete Long Term Goals: 12 weeks Long Term Goal 1: Patient will return to prior level of I with all B/IADLs, work, and leisure activities. Long Term Goal 1 Progress: Progressing toward goal Long Term Goal 2: Patient will increase AROM in left shoulder region to WNL for increased ability to reach overhead at work. Long Term Goal 2 Progress: Progressing toward goal Long Term Goal 3: Patient will increase left shoulder strength to 5/5 for increased I with lifting bins at work. Long Term Goal 3 Progress: Progressing toward goal Long Term Goal 4: Patient will decrease left shoulder pain to 2/10 when completing household cleaning tasks. Long Term Goal 4 Progress: Progressing toward goal Long Term Goal 5: Patient will decrease fascial restrictions to minimal - trace in her left shoulder region. Long Term Goal 5 Progress: Progressing toward goal  Problem List Patient Active Problem List  Diagnosis  . CHONDROMALACIA OF PATELLA  . POPLITEAL CYST, LEFT  . FOOT PAIN  . MEDIAL MENISCUS TEAR, LEFT  . Pain in joint, shoulder region  . Muscle weakness (generalized)  . Status post rotator cuff repair    End of Session Activity Tolerance: Patient tolerated treatment well General Behavior During Session: [facility] for tasks performed Cognition: [facility] for tasks performed  GO No functional  reporting required  Shirlean Mylar, OTR/L  10/04/2011, 9:38 AM

## 2011-10-05 ENCOUNTER — Ambulatory Visit (HOSPITAL_COMMUNITY)
Admission: RE | Admit: 2011-10-05 | Discharge: 2011-10-05 | Disposition: A | Discharge status: Home / Self Care | Payer: BC Managed Care – PPO | Source: Ambulatory Visit | Attending: Family Medicine | Admitting: Family Medicine

## 2011-10-05 DIAGNOSIS — M25519 Pain in unspecified shoulder: Secondary | ICD-10-CM

## 2011-10-05 DIAGNOSIS — Z9889 Other specified postprocedural states: Secondary | ICD-10-CM

## 2011-10-05 DIAGNOSIS — M6281 Muscle weakness (generalized): Secondary | ICD-10-CM

## 2011-10-05 NOTE — Progress Notes (Signed)
Occupational Therapy Treatment Patient Details  Name: Jo Hale MRN: [id number] Date of Birth: 1961-03-24  Today's Date: 10/05/2011 Time: 4098-1191 OT Time Calculation (min): 73 min  Manual Therapy: [id number] 30' Therapeutic Exercise: [phone] 41' Visit#: 29  of 36   Re-eval: 10/07/11   Subjective Pain Assessment Currently in Pain?: Yes Pain Score:   1 Pain Location: Shoulder Pain Orientation: Left Pain Type: Acute pain  Precautions/Restrictions   N/A  Exercise/Treatments Supine Protraction: PROM;Strengthening;10 reps Protraction Weight (lbs): 2 Horizontal ABduction: PROM;Strengthening;10 reps Horizontal ABduction Weight (lbs): 2 External Rotation: PROM;Strengthening;10 reps External Rotation Weight (lbs): 2 Internal Rotation: PROM;Strengthening;10 reps Internal Rotation Weight (lbs): 2 Flexion: PROM;Strengthening;10 reps Shoulder Flexion Weight (lbs): 2 ABduction: PROM;Strengthening;10 reps Shoulder ABduction Weight (lbs): 2 Seated Elevation: 15 reps;Theraband;Other (comment) (depression) Extension: Theraband;15 reps Theraband Level (Shoulder Extension): Level 3 (Green) Retraction: Theraband;15 reps Theraband Level (Shoulder Retraction): Level 3 (Green) Row: Theraband;15 reps Theraband Level (Shoulder Row): Level 3 (Green) Protraction: AROM;15 reps Horizontal ABduction: AROM;15 reps External Rotation: AROM;Theraband;15 reps Internal Rotation: AROM;Theraband;15 reps Flexion: AROM;15 reps Abduction: AROM;15 reps Other Seated Exercises: shoulder abducted and internally rotated until end feel. 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 Stretching of all shoulder regions also performed.  Occupational Therapy Assessment and Plan OT Assessment and Plan Clinical Impression Statement: A: Patient required less verbal cueing to depress shoulder blade during AROM and exercises when flexing/abducting past 90 degrees. Continued difficulty with combined shoulder extension/abduction/int rot and shoulder abd/ext rot. Increased supine strengthening to 2# with accurate form. Patient is very motivated and eager to return to PLOF.  OT Plan: P: Re-assess. Continue focus on combined shoulder extension/abduction/int rot and shoulder abduction/ext rot movements for increased ease with functional activities.   Goals Short Term Goals Time to Complete Short Term Goals: Other (comment) (6 weeks) Short Term Goal 1: Patient will be educated on HEP. Short Term Goal 1 Progress: Met Short Term Goal 2: Patient will increase PROM to [facility] for increased ability to don stockings. Short Term Goal 2 Progress: Met Short Term Goal 3: Patient will increase left shoulder strength to 3+/5 for increased ability to reach and lift overhead. Short Term Goal 3 Progress: Progressing toward goal Short Term Goal 4: Patient will decrease pain to 4/10 during functional activities. Short Term Goal 4 Progress: Progressing toward goal Short Term Goal 5: Patient will decrease fascial restrictions to min-mod in her left shoulder region. Short Term Goal 5 Progress: Progressing toward goal Long Term Goals Time to Complete Long Term Goals: 12 weeks Long Term Goal 1: Patient will  return to prior level of I with all B/IADLs, work, and leisure activities. Long Term Goal 1 Progress: Progressing toward goal Long Term Goal 2: Patient will increase AROM in left shoulder region to [facility] for increased ability to reach  overhead at work. Long Term Goal 2 Progress: Progressing toward goal Long Term Goal 3: Patient will increase left shoulder strength to 5/5 for increased I with lifting bins at work. Long Term Goal 3 Progress: Progressing toward goal Long Term Goal 4: Patient will decrease left shoulder pain to 2/10 when completing household cleaning tasks. Long Term Goal 4 Progress: Progressing toward goal Long Term Goal 5: Patient will decrease fascial restrictions to minimal - trace in her left shoulder region. Long Term Goal 5 Progress: Progressing toward goal  Problem List Patient Active Problem List  Diagnosis  . CHONDROMALACIA OF PATELLA  . POPLITEAL CYST, LEFT  . FOOT PAIN  . MEDIAL MENISCUS TEAR, LEFT  . Pain in joint, shoulder region  . Muscle weakness (generalized)  . Status post rotator cuff repair    End of Session Activity Tolerance: Patient tolerated treatment well General Behavior During Session: [facility] for tasks performed Cognition: [facility] for tasks performed  [name], OTS Occupational Therapy Student  10/05/2011, 9:24 AM

## 2011-10-05 NOTE — Progress Notes (Signed)
Occupational Therapy Treatment Patient Details  Name: Jo Hale MRN: [id number] Date of Birth: [birth year]  Today's Date: 10/05/2011 Time: 1478-2956 OT Time Calculation (min): 73 min  Manual Therapy: 2130-8657 30' Therapeutic Exercise: [phone] 8' Visit#: 29  of 36   Re-eval: 10/07/11   Subjective Pain Assessment Currently in Pain?: Yes Pain Score:   1 Pain Location: Shoulder Pain Orientation: Left Pain Type: Acute pain  Precautions/Restrictions   N/A  Exercise/Treatments Supine Protraction: PROM;Strengthening;10 reps Protraction Weight (lbs): 2 Horizontal ABduction: PROM;Strengthening;10 reps Horizontal ABduction Weight (lbs): 2 External Rotation: PROM;Strengthening;10 reps External Rotation Weight (lbs): 2 Internal Rotation: PROM;Strengthening;10 reps Internal Rotation Weight (lbs): 2 Flexion: PROM;Strengthening;10 reps Shoulder Flexion Weight (lbs): 2 ABduction: PROM;Strengthening;10 reps Shoulder ABduction Weight (lbs): 2 Seated Elevation: 15 reps;Theraband;Other (comment) (depression) Extension: Theraband;15 reps Theraband Level (Shoulder Extension): Level 3 (Green) Retraction: Theraband;15 reps Theraband Level (Shoulder Retraction): Level 3 (Green) Row: Theraband;15 reps Theraband Level (Shoulder Row): Level 3 (Green) Protraction: AROM;15 reps Horizontal ABduction: AROM;15 reps External Rotation: AROM;Theraband;15 reps Internal Rotation: AROM;Theraband;15 reps Flexion: AROM;15 reps Abduction: AROM;15 reps Other Seated Exercises: shoulder abducted and internally rotated until end feel. 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 Stretching of all shoulder regions also performed.  Occupational Therapy Assessment and Plan OT Assessment and Plan Clinical Impression Statement: A: Patient required less verbal cueing to depress shoulder blade during AROM and exercises when flexing/abducting past 90 degrees. Continued difficulty with combined shoulder extension/abduction/int rot and shoulder abd/ext rot. Increased supine strengthening to 2# with accurate form. Patient is very motivated and eager to return to PLOF.  OT Plan: P: Re-assess. Continue focus on combined shoulder extension/abduction/int rot and shoulder abduction/ext rot movements for increased ease with functional activities.   Goals Short Term Goals Time to Complete Short Term Goals: Other (comment) (6 weeks) Short Term Goal 1: Patient will be educated on HEP. Short Term Goal 1 Progress: Met Short Term Goal 2: Patient will increase PROM to [facility] for increased ability to don stockings. Short Term Goal 2 Progress: Met Short Term Goal 3: Patient will increase left shoulder strength to 3+/5 for increased ability to reach and lift overhead. Short Term Goal 3 Progress: Progressing toward goal Short Term Goal 4: Patient will decrease pain to 4/10 during functional activities. Short Term Goal 4 Progress: Progressing toward goal Short Term Goal 5: Patient will decrease fascial restrictions to min-mod in her left shoulder region. Short Term Goal 5 Progress: Progressing toward goal Long Term Goals Time to Complete Long Term Goals: 12 weeks Long Term Goal 1: Patient will  return to prior level of I with all B/IADLs, work, and leisure activities. Long Term Goal 1 Progress: Progressing toward goal Long Term Goal 2: Patient will increase AROM in left shoulder region to [facility] for increased ability to reach  overhead at work. Long Term Goal 2 Progress: Progressing toward goal Long Term Goal 3: Patient will increase left shoulder strength to 5/5 for increased I with lifting bins at work. Long Term Goal 3 Progress: Progressing toward goal Long Term Goal 4: Patient will decrease left shoulder pain to 2/10 when completing household cleaning tasks. Long Term Goal 4 Progress: Progressing toward goal Long Term Goal 5: Patient will decrease fascial restrictions to minimal - trace in her left shoulder region. Long Term Goal 5 Progress: Progressing toward goal  Problem List Patient Active Problem List  Diagnosis  . CHONDROMALACIA OF PATELLA  . POPLITEAL CYST, LEFT  . FOOT PAIN  . MEDIAL MENISCUS TEAR, LEFT  . Pain in joint, shoulder region  . Muscle weakness (generalized)  . Status post rotator cuff repair    End of Session Activity Tolerance: Patient tolerated treatment well General Behavior During Session: [facility] for tasks performed Cognition: [facility] for tasks performed  [name], OTS Occupational Therapy Student Note reviewed by clinical instructor and accurately reflects treatment session. Shirlean Mylar, OTR/L  10/05/2011, 9:24 AM

## 2011-10-07 ENCOUNTER — Ambulatory Visit (HOSPITAL_COMMUNITY)
Admission: RE | Admit: 2011-10-07 | Discharge: 2011-10-07 | Disposition: A | Discharge status: Home / Self Care | Payer: BC Managed Care – PPO | Source: Ambulatory Visit | Attending: Family Medicine | Admitting: Family Medicine

## 2011-10-07 DIAGNOSIS — M25519 Pain in unspecified shoulder: Secondary | ICD-10-CM

## 2011-10-07 DIAGNOSIS — M6281 Muscle weakness (generalized): Secondary | ICD-10-CM

## 2011-10-07 DIAGNOSIS — Z9889 Other specified postprocedural states: Secondary | ICD-10-CM

## 2011-10-07 NOTE — Progress Notes (Signed)
Occupational Therapy Treatment Patient Details  Name: Jo Hale MRN: [id number] Date of Birth: Jul 04, 1960  Today's Date: 10/07/2011 Time: 5784-6962 OT Time Calculation (min): 61 min  Manual Therapy: 9528-4132 24' Therapeutic Exercise: 4401-0272  37' Visit#: 30  of 36   Re-eval: 10/11/11   Subjective Symptoms/Limitations Symptoms: S: It is still sore and heavy at night, but I know that is from the exercises and work it does during the day. Pain Assessment Currently in Pain?: Yes Pain Score:   1 Pain Location: Shoulder Pain Orientation: Left Pain Type: Acute pain  Precautions/Restrictions   N/A  Exercise/Treatments Supine Protraction: PROM;Strengthening;12 reps Protraction Weight (lbs): 2 Horizontal ABduction: PROM;Strengthening;10 reps Horizontal ABduction Weight (lbs): 2 External Rotation: PROM;Strengthening;12 reps External Rotation Weight (lbs): 2 Internal Rotation: PROM;Strengthening;12 reps Internal Rotation Weight (lbs): 2 Flexion: PROM;Strengthening;12 reps Shoulder Flexion Weight (lbs): 2 ABduction: PROM;Strengthening;12 reps Shoulder ABduction Weight (lbs): 2 Seated Elevation: 15 reps;Theraband;Other (comment) (shoulder depression with therapist holding tband over head) Extension: Theraband;15 reps Theraband Level (Shoulder Extension): Level 3 (Green) Retraction: Theraband;15 reps Theraband Level (Shoulder Retraction): Level 3 (Green) Row: Theraband;15 reps Theraband Level (Shoulder Row): Level 3 (Green) Protraction: AROM;15 reps Horizontal ABduction: AROM;15 reps External Rotation: AROM;Theraband;15 reps Internal Rotation: AROM;Theraband;15 reps Flexion: AROM;15 reps Abduction: AROM;15 reps   Therapy Ball Flexion: 25 reps ABduction: 25 reps Right/Left: Other (comment) (7) ROM / Strengthening / Isometric Strengthening UBE (Upper Arm Bike): 3' forward and 3' backwards 2.5 Wall Wash: 2' with 1.5# Thumb Tacks: 1' "W" Arms: x15 X to V Arms:  x15 Proximal Shoulder Strengthening, Seated: x20 Ball on Wall: 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  Added MFR to pectoral region and posterior capsule stretch.  [phone] - completed by Leia Alf, OTR/L  Occupational Therapy Assessment and Plan OT Assessment and Plan Clinical Impression Statement: A: Patient only required verbal cueing to depress shoulder during 1 AROM exercise. Increase in P/AROM ext/int rot noted this visit. Patient reports many exercises are getting easier. OT Plan: P: Re-assess on Monday as patient has a doctors appt Monday afternoon. Add 1# weight to seated AROM exercises and increase resistance of UBE.   Goals Short Term Goals Time to Complete Short Term Goals: Other (comment) (6 weeks) Short Term Goal 1: Patient will be educated on HEP. Short Term Goal 1 Progress: Met Short Term Goal 2: Patient will increase PROM to [facility] for increased ability to don stockings. Short Term Goal 2 Progress: Met Short Term Goal 3: Patient will increase left shoulder strength to 3+/5 for increased ability to reach and lift overhead. Short Term Goal 3 Progress: Progressing toward goal Short Term Goal 4: Patient will decrease pain to 4/10 during functional activities. Short Term Goal 4 Progress: Progressing toward goal Short Term Goal 5: Patient will decrease fascial restrictions to min-mod in her left shoulder region. Short Term Goal 5 Progress: Progressing toward goal Long Term Goals Time to Complete Long Term Goals: 12 weeks Long Term Goal 1: Patient will return to prior level of I with all B/IADLs, work, and leisure activities. Long Term Goal 1 Progress: Progressing toward goal Long Term Goal 2: Patient  will increase AROM in left shoulder region to WNL for increased ability to reach overhead at work. Long  Term Goal 2 Progress: Progressing toward goal Long Term Goal 3: Patient will increase left shoulder strength to 5/5 for increased I with lifting bins at work. Long Term Goal 3 Progress: Progressing toward goal Long Term Goal 4: Patient will decrease left shoulder pain to 2/10 when completing household cleaning tasks. Long Term Goal 4 Progress: Progressing toward goal Long Term Goal 5: Patient will decrease fascial restrictions to minimal - trace in her left shoulder region. Long Term Goal 5 Progress: Progressing toward goal  Problem List Patient Active Problem List  Diagnosis  . CHONDROMALACIA OF PATELLA  . POPLITEAL CYST, LEFT  . FOOT PAIN  . MEDIAL MENISCUS TEAR, LEFT  . Pain in joint, shoulder region  . Muscle weakness (generalized)  . Status post rotator cuff repair    End of Session Activity Tolerance: Patient tolerated treatment well General Behavior During Session: [facility] for tasks performed Cognition: [facility] for tasks performed    [name], OTS Occupational Therapy Student  10/07/2011, 9:13 AM

## 2011-10-07 NOTE — Progress Notes (Signed)
Occupational Therapy Treatment Patient Details  Name: Jo Hale MRN: [id number] Date of Birth: 02-17-1961  Today's Date: 10/07/2011 Time: 4098-1191 OT Time Calculation (min): 61 min  Manual Therapy: 4782-9562 24' Therapeutic Exercise: 1308-6578  37' Visit#: 30  of 36   Re-eval: 10/11/11   Subjective Symptoms/Limitations Symptoms: S: It is still sore and heavy at night, but I know that is from the exercises and work it does during the day. Pain Assessment Currently in Pain?: Yes Pain Score:   1 Pain Location: Shoulder Pain Orientation: Left Pain Type: Acute pain  Precautions/Restrictions   N/A  Exercise/Treatments Supine Protraction: PROM;Strengthening;12 reps Protraction Weight (lbs): 2 Horizontal ABduction: PROM;Strengthening;10 reps Horizontal ABduction Weight (lbs): 2 External Rotation: PROM;Strengthening;12 reps External Rotation Weight (lbs): 2 Internal Rotation: PROM;Strengthening;12 reps Internal Rotation Weight (lbs): 2 Flexion: PROM;Strengthening;12 reps Shoulder Flexion Weight (lbs): 2 ABduction: PROM;Strengthening;12 reps Shoulder ABduction Weight (lbs): 2 Seated Elevation: 15 reps;Theraband;Other (comment) (shoulder depression with therapist holding tband over head) Extension: Theraband;15 reps Theraband Level (Shoulder Extension): Level 3 (Green) Retraction: Theraband;15 reps Theraband Level (Shoulder Retraction): Level 3 (Green) Row: Theraband;15 reps Theraband Level (Shoulder Row): Level 3 (Green) Protraction: AROM;15 reps Horizontal ABduction: AROM;15 reps External Rotation: AROM;Theraband;15 reps Internal Rotation: AROM;Theraband;15 reps Flexion: AROM;15 reps Abduction: AROM;15 reps   Therapy Ball Flexion: 25 reps ABduction: 25 reps Right/Left: Other (comment) (7) ROM / Strengthening / Isometric Strengthening UBE (Upper Arm Bike): 3' forward and 3' backwards 2.5 Wall Wash: 2' with 1.5# Thumb Tacks: 1' "W" Arms: x15 X to V Arms:  x15 Proximal Shoulder Strengthening, Seated: x20 Ball on Wall: 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  Added MFR to pectoral region and posterior capsule stretch.  [phone] - completed by Leia Alf, OTR/L  Occupational Therapy Assessment and Plan OT Assessment and Plan Clinical Impression Statement: A: Patient only required verbal cueing to depress shoulder during 1 AROM exercise. Increase in P/AROM ext/int rot noted this visit. Patient reports many exercises are getting easier. OT Plan: P: Re-assess on Monday as patient has a doctors appt Monday afternoon. Add 1# weight to seated AROM exercises and increase resistance of UBE.   Goals Short Term Goals Time to Complete Short Term Goals: Other (comment) (6 weeks) Short Term Goal 1: Patient will be educated on HEP. Short Term Goal 1 Progress: Met Short Term Goal 2: Patient will increase PROM to [facility] for increased ability to don stockings. Short Term Goal 2 Progress: Met Short Term Goal 3: Patient will increase left shoulder strength to 3+/5 for increased ability to reach and lift overhead. Short Term Goal 3 Progress: Progressing toward goal Short Term Goal 4: Patient will decrease pain to 4/10 during functional activities. Short Term Goal 4 Progress: Progressing toward goal Short Term Goal 5: Patient will decrease fascial restrictions to min-mod in her left shoulder region. Short Term Goal 5 Progress: Progressing toward goal Long Term Goals Time to Complete Long Term Goals: 12 weeks Long Term Goal 1: Patient will return to prior level of I with all B/IADLs, work, and leisure activities. Long Term Goal 1 Progress: Progressing toward goal Long Term Goal 2: Patient  will increase AROM in left shoulder region to WNL for increased ability to reach overhead at work. Long  Term Goal 2 Progress: Progressing toward goal Long Term Goal 3: Patient will increase left shoulder strength to 5/5 for increased I with lifting bins at work. Long Term Goal 3 Progress: Progressing toward goal Long Term Goal 4: Patient will decrease left shoulder pain to 2/10 when completing household cleaning tasks. Long Term Goal 4 Progress: Progressing toward goal Long Term Goal 5: Patient will decrease fascial restrictions to minimal - trace in her left shoulder region. Long Term Goal 5 Progress: Progressing toward goal  Problem List Patient Active Problem List  Diagnosis  . CHONDROMALACIA OF PATELLA  . POPLITEAL CYST, LEFT  . FOOT PAIN  . MEDIAL MENISCUS TEAR, LEFT  . Pain in joint, shoulder region  . Muscle weakness (generalized)  . Status post rotator cuff repair    End of Session Activity Tolerance: Patient tolerated treatment well General Behavior During Session: [facility] for tasks performed Cognition: [facility] for tasks performed    [name], OTS Occupational Therapy Student Note reviewed by clinical instructor and accurately reflects treatment session.  Shirlean Mylar, OTR/L  10/07/2011, 9:13 AM

## 2011-10-11 ENCOUNTER — Ambulatory Visit (HOSPITAL_COMMUNITY)
Admission: RE | Admit: 2011-10-11 | Discharge: 2011-10-11 | Disposition: A | Discharge status: Home / Self Care | Payer: BC Managed Care – PPO | Source: Ambulatory Visit | Attending: Orthopedic Surgery | Admitting: Orthopedic Surgery

## 2011-10-11 DIAGNOSIS — IMO0001 Reserved for inherently not codable concepts without codable children: Secondary | ICD-10-CM | POA: Insufficient documentation

## 2011-10-11 DIAGNOSIS — M6281 Muscle weakness (generalized): Secondary | ICD-10-CM | POA: Insufficient documentation

## 2011-10-11 DIAGNOSIS — M25619 Stiffness of unspecified shoulder, not elsewhere classified: Secondary | ICD-10-CM | POA: Insufficient documentation

## 2011-10-11 DIAGNOSIS — Z9889 Other specified postprocedural states: Secondary | ICD-10-CM

## 2011-10-11 DIAGNOSIS — M25519 Pain in unspecified shoulder: Secondary | ICD-10-CM | POA: Insufficient documentation

## 2011-10-11 NOTE — Progress Notes (Signed)
Occupational Therapy Treatment Patient Details  Name: Jo Hale MRN: [id number] Date of Birth: November 13, 1960  Today's Date: 10/11/2011 Time: 4098-1191 OT Time Calculation (min): 76 min Manual Therapy [phone] 21' Reassessment [phone] 10' Therapeutic Exercises [phone] 45' Visit#: 31  of 36   Re-eval: 11/08/11    Subjective S: The hardest thing for me to do is fasten a skirt in the rear. Pain Assessment Currently in Pain?: No/denies Pain Score: 0-No pain  Precautions/Restrictions   N/A  Exercise/Treatments Supine Protraction: PROM;10 reps (Simultaneous filing. User may not have seen previous data.) Horizontal ABduction: PROM;10 reps (Simultaneous filing. User may not have seen previous data.) External Rotation: PROM;10 reps (Simultaneous filing. User may not have seen previous data.) Internal Rotation: PROM;10 reps (Simultaneous filing. User may not have seen previous data.) Flexion: PROM;10 reps (Simultaneous filing. User may not have seen previous data.) ABduction: PROM;10 reps (Simultaneous filing. User may not have seen previous data.) Seated Elevation: 15 reps;Theraband;Other (comment) (depression with green tband) Extension: Theraband;15 reps (green) Retraction: Theraband;15 reps (green) Row: Theraband;15 reps (green) Protraction: Strengthening;10 reps Protraction Weight (lbs): 1 Protraction Limitations: tactile cue to maintain depressed scapula Horizontal ABduction: Strengthening;10 reps Horizontal ABduction Weight (lbs): 1 Horizontal ABduction Limitations: tactile cuing to maintain depressed scapula External Rotation: Strengthening;10 reps External Rotation Weight (lbs): 1 Internal Rotation: Strengthening;10 reps Internal Rotation Weight (lbs): 1 Flexion: Strengthening;10 reps Flexion Weight (lbs): 1 Flexion Limitations: tactile cuing to maintain depressed scapula Abduction: Strengthening;10 reps ABduction Weight (lbs): 1 ABduction Limitations: tactile  cuing to maintain depressed scapula Other Seated Exercises: shoulder abducted and internally rotated until end feel. 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  [phone] completed by [name], OTS  Occupational Therapy Assessment and Plan OT Assessment and Plan Clinical Impression Statement: A:  Please refer to monthly progress note for assessment details.  Completed UEFI scored 72/80= 90% level of I. OT Frequency: Min 3X/week OT Duration: 4 weeks OT Plan: P:  Begin box carrying for work hardening and add cybex press and row for proximal shoulder strengthening.   Goals Short Term Goals Time to Complete Short Term Goals: Other (comment) (6 weeks) Short Term Goal 1: Patient will be educated on HEP. Short Term Goal 1 Progress: Met Short Term Goal 2: Patient will increase PROM to [facility] for increased ability to don stockings. Short Term Goal 2 Progress: Met Short Term Goal 3: Patient will increase left shoulder strength to 3+/5 for increased ability to reach and lift overhead. Short Term Goal 3  Progress: Met Short Term Goal 4: Patient will decrease pain to 4/10 during functional activities. Short Term Goal 4 Progress: Met Short Term  Goal 5: Patient will decrease fascial restrictions to min-mod in her left shoulder region. Short Term Goal 5 Progress: Met Long Term Goals Time to Complete Long Term Goals: 12 weeks Long Term Goal 1: Patient will return to prior level of I with all B/IADLs, work, and leisure activities. Long Term Goal 1 Progress: Progressing toward goal Long Term Goal 2: Patient will increase AROM in left shoulder region to WNL for increased ability to reach overhead at work. Long Term Goal 2 Progress: Progressing toward goal Long Term Goal 3: Patient will increase left shoulder strength to 5/5 for increased I with lifting bins at work. Long Term Goal 3 Progress: Progressing toward goal Long Term Goal 4: Patient will decrease left shoulder pain to 2/10 when completing household cleaning tasks. Long Term Goal 4 Progress: Met Long Term Goal 5: Patient will decrease fascial restrictions to minimal - trace in her left shoulder region. Long Term Goal 5 Progress: Progressing toward goal  Problem List Patient Active Problem List  Diagnosis  . CHONDROMALACIA OF PATELLA  . POPLITEAL CYST, LEFT  . FOOT PAIN  . MEDIAL MENISCUS TEAR, LEFT  . Pain in joint, shoulder region  . Muscle weakness (generalized)  . Status post rotator cuff repair    End of Session Activity Tolerance: Patient tolerated treatment well General Behavior During Session: [facility] for tasks performed Cognition: [facility] for tasks performed OT Plan of Care OT Home Exercise Plan: Added cervical stretches to HEP as patient has signficant fascial restrictions in her trapezius and levator scapulae.  GO    Shirlean Mylar, OTR/L  10/11/2011, 9:40 AM

## 2011-10-12 ENCOUNTER — Ambulatory Visit (HOSPITAL_COMMUNITY)
Admission: RE | Admit: 2011-10-12 | Discharge: 2011-10-12 | Disposition: A | Discharge status: Home / Self Care | Payer: BC Managed Care – PPO | Source: Ambulatory Visit | Attending: Family Medicine | Admitting: Family Medicine

## 2011-10-12 DIAGNOSIS — Z9889 Other specified postprocedural states: Secondary | ICD-10-CM

## 2011-10-12 DIAGNOSIS — M6281 Muscle weakness (generalized): Secondary | ICD-10-CM

## 2011-10-12 DIAGNOSIS — M25519 Pain in unspecified shoulder: Secondary | ICD-10-CM

## 2011-10-12 NOTE — Progress Notes (Signed)
Occupational Therapy Treatment Patient Details  Name: Jo Hale MRN: [id number] Date of Birth: 11/14/1960  Today's Date: 10/12/2011 Time: 0809-0900 OT Time Calculation (min): 51 min  Manual Therapy: 4098-1191 28' Therapeutic Exercise: 4782-9562 23' Visit#: 32  of 36   Re-eval: 11/08/11    Subjective Symptoms/Limitations Symptoms: S: I went back to work last night. The doctor was pleased with my progress, I just need to improve this motion (ext rot). Pain Assessment Currently in Pain?: No/denies (No pain, slight tightness due to working 9 hr shift last nt)  Precautions/Restrictions   N/A  Exercise/Treatments Supine Protraction: PROM;10 reps;Strengthening;15 reps Protraction Weight (lbs): 2 Horizontal ABduction: PROM;10 reps;Strengthening;15 reps Horizontal ABduction Weight (lbs): 2 External Rotation: PROM;10 reps;Strengthening;15 reps External Rotation Weight (lbs): 2 Internal Rotation: PROM;10 reps;Strengthening;15 reps Internal Rotation Weight (lbs): 2 Flexion: PROM;10 reps;Strengthening;15 reps Shoulder Flexion Weight (lbs): 2 ABduction: PROM;10 reps;Strengthening;15 reps Shoulder ABduction Weight (lbs): 2 Seated Protraction: Strengthening;10 reps Protraction Weight (lbs): 2 Horizontal ABduction: Strengthening;10 reps Horizontal ABduction Weight (lbs): 2 External Rotation: Strengthening;Theraband;10 reps Theraband Level (Shoulder External Rotation): Level 2 (Red) (arm abducted) Internal Rotation: Strengthening;Theraband;10 reps Theraband Level (Shoulder Internal Rotation): Level 2 (Red) (arm abducted) Flexion: Strengthening;10 reps Flexion Weight (lbs): 2 Abduction: Strengthening;10 reps ABduction Weight (lbs): 2 Other Seated Exercises: reached for back of head, then to lower back with LUE x10. Minimal facilitation required to keep shoulder depressed/relaxed. Other Seated Exercises: shoulder abducted and internally rotated until end feel. Hold that position  and flex/extend elbow to work on the fluidity of all 3 movements combined x 10   Therapy Ball Flexion: Other (comment) (resume next visit) ABduction: Other (comment) (resume next visit) Right/Left: Other (comment) (resume next visit) ROM / Strengthening / Isometric Strengthening UBE (Upper Arm Bike): 3' forward and 3' backwards 3.0 Wall Wash: held today as patient has worked all night and did similar movement "W" Arms: 10 with 2# X to V Arms: 10 with 2# Proximal Shoulder Strengthening, Seated: resume next visit Ball on Wall: held today as patient has worked all night and did similar movement Other ROM/Strengthening Exercises: box lifting with 15# in box from floor to waist height x10 and from floor to chest height x10        Manual Therapy Manual Therapy: Myofascial release Myofascial Release: MFR and manual stretching to left upper arm, scapular and trapezius regions to decrease pain and restrictions and increase pain free P/AROM. Increased restrictions noted this visit, likely due to working  9 hours last night.  Occupational Therapy Assessment and Plan OT Assessment and Plan Clinical Impression Statement: A: Added simulated work task of lifting 15# box to waist and chest level. Increased seated strengthening to 2#. Patient required vc and pa to keep shoulder depressed during theraband and seated strengthening. OT Plan: P: Add cybex press and row for proximal shoulder strengthening. Increase reps of box lifting work hardening exercise.    Goals Short Term Goals Time to Complete Short Term Goals: Other (comment) (6 weeks) Short Term Goal 1: Patient will be educated on HEP. Short Term Goal 1 Progress: Met Short Term Goal 2: Patient will increase PROM to [facility] for increased ability to don stockings. Short Term Goal 2 Progress: Met Short Term Goal 3: Patient will increase left shoulder strength to 3+/5 for increased ability to reach and lift overhead. Short Term Goal 3 Progress:  Met Short Term Goal 4: Patient will decrease pain to 4/10 during functional activities. Short Term Goal 4 Progress: Met Short Term Goal 5: Patient  will decrease fascial restrictions to min-mod in her left shoulder region. Short Term Goal 5 Progress: Met Long Term Goals Time to Complete Long Term Goals: 12 weeks Long Term Goal 1: Patient will return to prior level of I with all B/IADLs, work, and leisure activities. Long Term Goal 1 Progress: Progressing toward goal Long Term Goal 2: Patient will increase AROM in left shoulder region to WNL for increased ability to reach overhead at work. Long Term Goal 2 Progress: Progressing toward goal Long Term Goal 3: Patient will increase left shoulder strength to 5/5 for increased I with lifting bins at work. Long Term Goal 3 Progress: Progressing toward goal Long Term Goal 4: Patient will decrease left shoulder pain to 2/10 when completing household cleaning tasks. Long Term Goal 4 Progress: Met Long Term Goal 5: Patient will decrease fascial restrictions to minimal - trace in her left shoulder region. Long Term Goal 5 Progress: Progressing toward goal  Problem List Patient Active Problem List  Diagnosis  . CHONDROMALACIA OF PATELLA  . POPLITEAL CYST, LEFT  . FOOT PAIN  . MEDIAL MENISCUS TEAR, LEFT  . Pain in joint, shoulder region  . Muscle weakness (generalized)  . Status post rotator cuff repair    End of Session Activity Tolerance: Patient tolerated treatment well General Behavior During Session: [facility] for tasks performed Cognition: [facility] for tasks performed  GO   [name], OTS Occupational Therapy Student  10/12/2011, 9:04 AM

## 2011-10-12 NOTE — Progress Notes (Signed)
Occupational Therapy Treatment Patient Details  Name: Jo Hale MRN: [id number] Date of Birth: 17-Aug-1960  Today's Date: 10/12/2011 Time: 0809-0900 OT Time Calculation (min): 51 min  Manual Therapy: 4098-1191 28' Therapeutic Exercise: 4782-9562 23' Visit#: 32  of 36   Re-eval: 11/08/11    Subjective Symptoms/Limitations Symptoms: S: I went back to work last night. The doctor was pleased with my progress, I just need to improve this motion (ext rot). Pain Assessment Currently in Pain?: No/denies (No pain, slight tightness due to working 9 hr shift last nt)  Precautions/Restrictions   N/A  Exercise/Treatments Supine Protraction: PROM;10 reps;Strengthening;15 reps Protraction Weight (lbs): 2 Horizontal ABduction: PROM;10 reps;Strengthening;15 reps Horizontal ABduction Weight (lbs): 2 External Rotation: PROM;10 reps;Strengthening;15 reps External Rotation Weight (lbs): 2 Internal Rotation: PROM;10 reps;Strengthening;15 reps Internal Rotation Weight (lbs): 2 Flexion: PROM;10 reps;Strengthening;15 reps Shoulder Flexion Weight (lbs): 2 ABduction: PROM;10 reps;Strengthening;15 reps Shoulder ABduction Weight (lbs): 2 Seated Protraction: Strengthening;10 reps Protraction Weight (lbs): 2 Horizontal ABduction: Strengthening;10 reps Horizontal ABduction Weight (lbs): 2 External Rotation: Strengthening;Theraband;10 reps Theraband Level (Shoulder External Rotation): Level 2 (Red) (arm abducted) Internal Rotation: Strengthening;Theraband;10 reps Theraband Level (Shoulder Internal Rotation): Level 2 (Red) (arm abducted) Flexion: Strengthening;10 reps Flexion Weight (lbs): 2 Abduction: Strengthening;10 reps ABduction Weight (lbs): 2 Other Seated Exercises: reached for back of head, then to lower back with LUE x10. Minimal facilitation required to keep shoulder depressed/relaxed. Other Seated Exercises: shoulder abducted and internally rotated until end feel. Hold that position  and flex/extend elbow to work on the fluidity of all 3 movements combined x 10   Therapy Ball Flexion: Other (comment) (resume next visit) ABduction: Other (comment) (resume next visit) Right/Left: Other (comment) (resume next visit) ROM / Strengthening / Isometric Strengthening UBE (Upper Arm Bike): 3' forward and 3' backwards 3.0 Wall Wash: held today as patient has worked all night and did similar movement "W" Arms: 10 with 2# X to V Arms: 10 with 2# Proximal Shoulder Strengthening, Seated: resume next visit Ball on Wall: held today as patient has worked all night and did similar movement Other ROM/Strengthening Exercises: box lifting with 15# in box from floor to waist height x10 and from floor to chest height x10        Manual Therapy Manual Therapy: Myofascial release Myofascial Release: MFR and manual stretching to left upper arm, scapular and trapezius regions to decrease pain and restrictions and increase pain free P/AROM. Increased restrictions noted this visit, likely due to working  9 hours last night.  Occupational Therapy Assessment and Plan OT Assessment and Plan Clinical Impression Statement: A: Added simulated work task of lifting 15# box to waist and chest level. Increased seated strengthening to 2#. Patient required vc and pa to keep shoulder depressed during theraband and seated strengthening. OT Plan: P: Add cybex press and row for proximal shoulder strengthening. Increase reps of box lifting work hardening exercise.    Goals Short Term Goals Time to Complete Short Term Goals: Other (comment) (6 weeks) Short Term Goal 1: Patient will be educated on HEP. Short Term Goal 1 Progress: Met Short Term Goal 2: Patient will increase PROM to [facility] for increased ability to don stockings. Short Term Goal 2 Progress: Met Short Term Goal 3: Patient will increase left shoulder strength to 3+/5 for increased ability to reach and lift overhead. Short Term Goal 3 Progress:  Met Short Term Goal 4: Patient will decrease pain to 4/10 during functional activities. Short Term Goal 4 Progress: Met Short Term Goal 5: Patient  will decrease fascial restrictions to min-mod in her left shoulder region. Short Term Goal 5 Progress: Met Long Term Goals Time to Complete Long Term Goals: 12 weeks Long Term Goal 1: Patient will return to prior level of I with all B/IADLs, work, and leisure activities. Long Term Goal 1 Progress: Progressing toward goal Long Term Goal 2: Patient will increase AROM in left shoulder region to WNL for increased ability to reach overhead at work. Long Term Goal 2 Progress: Progressing toward goal Long Term Goal 3: Patient will increase left shoulder strength to 5/5 for increased I with lifting bins at work. Long Term Goal 3 Progress: Progressing toward goal Long Term Goal 4: Patient will decrease left shoulder pain to 2/10 when completing household cleaning tasks. Long Term Goal 4 Progress: Met Long Term Goal 5: Patient will decrease fascial restrictions to minimal - trace in her left shoulder region. Long Term Goal 5 Progress: Progressing toward goal  Problem List Patient Active Problem List  Diagnosis  . CHONDROMALACIA OF PATELLA  . POPLITEAL CYST, LEFT  . FOOT PAIN  . MEDIAL MENISCUS TEAR, LEFT  . Pain in joint, shoulder region  . Muscle weakness (generalized)  . Status post rotator cuff repair    End of Session Activity Tolerance: Patient tolerated treatment well General Behavior During Session: [facility] for tasks performed Cognition: [facility] for tasks performed  GO   [name], OTS Occupational Therapy Student Note reviewed by clinical instructor and accurately reflects treatment session. Shirlean Mylar, OTR/L  10/12/2011, 9:04 AM

## 2011-10-15 ENCOUNTER — Ambulatory Visit (HOSPITAL_COMMUNITY)
Admission: RE | Admit: 2011-10-15 | Discharge: 2011-10-15 | Disposition: A | Discharge status: Home / Self Care | Payer: BC Managed Care – PPO | Source: Ambulatory Visit | Attending: Family Medicine | Admitting: Family Medicine

## 2011-10-15 DIAGNOSIS — M6281 Muscle weakness (generalized): Secondary | ICD-10-CM

## 2011-10-15 DIAGNOSIS — Z9889 Other specified postprocedural states: Secondary | ICD-10-CM

## 2011-10-15 DIAGNOSIS — M25519 Pain in unspecified shoulder: Secondary | ICD-10-CM

## 2011-10-15 NOTE — Progress Notes (Signed)
Occupational Therapy Treatment Patient Details  Name: Jo Hale MRN: [id number] Date of Birth: March 15, 1961  Today's Date: 10/15/2011 Time: 6578-4696 OT Time Calculation (min): 45 min  Manual Therapy: 2952-8413 20' Therapeutic Exercise: 2440-1027 25' Visit#: 33  of 36   Re-eval: 11/08/11   Subjective Symptoms/Limitations Symptoms: S: I am sore and tired from work last night. Working 9 hours overnight and then coming to therapy wears me out. Pain Assessment Currently in Pain?: Yes Pain Score:   2 Pain Location: Shoulder Pain Orientation: Left Pain Type: Acute pain  Precautions/Restrictions   N/A  Exercise/Treatments Supine Protraction: PROM;10 reps;Strengthening;15 reps Protraction Weight (lbs): 2 Horizontal ABduction: PROM;10 reps;Strengthening;15 reps Horizontal ABduction Weight (lbs): 2 External Rotation: PROM;10 reps;Strengthening;15 reps;Other (comment) (minimal facilitation at end feel for continued stretch) External Rotation Weight (lbs): 2 Internal Rotation: PROM;10 reps;Strengthening;15 reps Internal Rotation Weight (lbs): 2 Flexion: PROM;10 reps;Strengthening;15 reps Shoulder Flexion Weight (lbs): 2 ABduction: PROM;10 reps;Strengthening;15 reps Shoulder ABduction Weight (lbs): 2 Seated Elevation: 15 reps Extension: Other (comment) (Held today due to patient fatigue. Will do HEP this pm.) Protraction: Strengthening;12 reps Protraction Weight (lbs): 2 Horizontal ABduction: Strengthening;12 reps Horizontal ABduction Weight (lbs): 2 Flexion: Strengthening;12 reps Flexion Weight (lbs): 2 Abduction: Strengthening;12 reps ABduction Weight (lbs): 2 Other Seated Exercises: reached for back of head, then to lower back with LUE x10. Minimal facilitation required to keep shoulder depressed/relaxed. Other Seated Exercises: shoulder abducted and internally rotated until end feel. Hold that position and flex/extend elbow to work on the fluidity of all 3 movements  combined x 10   Therapy Ball Flexion: 25 reps ABduction: 25 reps Right/Left: 5 reps ROM / Strengthening / Isometric Strengthening UBE (Upper Arm Bike): 3' forward and 3' backwards 3.0 Wall Wash: held today as patient has worked all night and did similar movement "W" Arms: 10 with 2# X to V Arms: 10 with 2# Proximal Shoulder Strengthening, Seated: held this visit due to addition of cybex Ball on Wall: held today as patient has worked all night and did similar movement Other ROM/Strengthening Exercises: held this visit due to patient's fatigue from overnight workshift        Manual Therapy Manual Therapy: Myofascial release Myofascial Release: MFR and massage to left scapular, trapezius, pectoral, shoulder and upper arm regions with increased focus on pectoral and shoulder regions as patient reports the most tightness from those areas. Inceased tigthness noted, likely due to working 9 hour shift last night. Manual stretching also performed in all shoulder regions.   Occupational Therapy Assessment and Plan OT Assessment and Plan Clinical Impression Statement: A: Added cybex for proximal shoulder strengthening. Patient reported increased fatigue and tightness this visit due to overnight work shift but demonstrates improved combined shoulder abduction/internal rotation and elbow flexion/extension for increased ability to reach lower/mid back. Held theraband strengthening due to time restraints and increased fatigue. OT Plan: P: Resume work hardening exercises - ball on wall and box lifting. Increase resistance of UBE.   Goals Short Term Goals Time to Complete Short Term Goals: Other (comment) (6 weeks) Short Term Goal 1: Patient will be educated on HEP. Short Term Goal 1 Progress: Met Short Term Goal 2: Patient will increase PROM to [facility] for increased ability to don stockings. Short Term Goal 2 Progress: Met Short Term Goal 3: Patient will increase left shoulder strength to 3+/5 for  increased ability to reach and lift overhead. Short Term Goal 3 Progress: Met Short Term Goal 4: Patient will decrease pain to 4/10 during functional activities.  Short Term Goal 4 Progress: Met Short Term Goal 5: Patient will decrease fascial restrictions to min-mod in her left shoulder region. Short Term Goal 5 Progress: Met Long Term Goals Time to Complete Long Term Goals: 12 weeks Long Term Goal 1: Patient will return to prior level of I with all B/IADLs, work, and leisure activities. Long Term Goal 1 Progress: Progressing toward goal Long Term Goal 2: Patient will increase AROM in left shoulder region to WNL for increased ability to reach overhead at work. Long Term Goal 2 Progress: Progressing toward goal Long Term Goal 3: Patient will increase left shoulder strength to 5/5 for increased I with lifting bins at work. Long Term Goal 3 Progress: Progressing toward goal Long Term Goal 4: Patient will decrease left shoulder pain to 2/10 when completing household cleaning tasks. Long Term Goal 4 Progress: Progressing toward goal Long Term Goal 5: Patient will decrease fascial restrictions to minimal - trace in her left shoulder region. Long Term Goal 5 Progress: Progressing toward goal  Problem List Patient Active Problem List  Diagnosis  . CHONDROMALACIA OF PATELLA  . POPLITEAL CYST, LEFT  . FOOT PAIN  . MEDIAL MENISCUS TEAR, LEFT  . Pain in joint, shoulder region  . Muscle weakness (generalized)  . Status post rotator cuff repair    End of Session Activity Tolerance: Patient tolerated treatment well General Behavior During Session: [facility] for tasks performed Cognition: [facility] for tasks performed   [name], OTS Occupational Therapy Student  10/15/2011, 9:01 AM

## 2011-10-15 NOTE — Progress Notes (Signed)
Occupational Therapy Treatment Patient Details  Name: Jo Hale MRN: [id number] Date of Birth: 05-30-1960  Today's Date: 10/15/2011 Time: 7829-5621 OT Time Calculation (min): 45 min  Manual Therapy: 3086-5784 20' Therapeutic Exercise: 6962-9528 25' Visit#: 33  of 36   Re-eval: 11/08/11   Subjective Symptoms/Limitations Symptoms: S: I am sore and tired from work last night. Working 9 hours overnight and then coming to therapy wears me out. Pain Assessment Currently in Pain?: Yes Pain Score:   2 Pain Location: Shoulder Pain Orientation: Left Pain Type: Acute pain  Precautions/Restrictions   N/A  Exercise/Treatments Supine Protraction: PROM;10 reps;Strengthening;15 reps Protraction Weight (lbs): 2 Horizontal ABduction: PROM;10 reps;Strengthening;15 reps Horizontal ABduction Weight (lbs): 2 External Rotation: PROM;10 reps;Strengthening;15 reps;Other (comment) (minimal facilitation at end feel for continued stretch) External Rotation Weight (lbs): 2 Internal Rotation: PROM;10 reps;Strengthening;15 reps Internal Rotation Weight (lbs): 2 Flexion: PROM;10 reps;Strengthening;15 reps Shoulder Flexion Weight (lbs): 2 ABduction: PROM;10 reps;Strengthening;15 reps Shoulder ABduction Weight (lbs): 2 Seated Elevation: 15 reps Extension: Other (comment) (Held today due to patient fatigue. Will do HEP this pm.) Protraction: Strengthening;12 reps Protraction Weight (lbs): 2 Horizontal ABduction: Strengthening;12 reps Horizontal ABduction Weight (lbs): 2 Flexion: Strengthening;12 reps Flexion Weight (lbs): 2 Abduction: Strengthening;12 reps ABduction Weight (lbs): 2 Other Seated Exercises: reached for back of head, then to lower back with LUE x10. Minimal facilitation required to keep shoulder depressed/relaxed. Other Seated Exercises: shoulder abducted and internally rotated until end feel. Hold that position and flex/extend elbow to work on the fluidity of all 3 movements  combined x 10   Therapy Ball Flexion: 25 reps ABduction: 25 reps Right/Left: 5 reps ROM / Strengthening / Isometric Strengthening UBE (Upper Arm Bike): 3' forward and 3' backwards 3.0 Wall Wash: held today as patient has worked all night and did similar movement "W" Arms: 10 with 2# X to V Arms: 10 with 2# Proximal Shoulder Strengthening, Seated: held this visit due to addition of cybex Ball on Wall: held today as patient has worked all night and did similar movement Other ROM/Strengthening Exercises: held this visit due to patient's fatigue from overnight workshift        Manual Therapy Manual Therapy: Myofascial release Myofascial Release: MFR and massage to left scapular, trapezius, pectoral, shoulder and upper arm regions with increased focus on pectoral and shoulder regions as patient reports the most tightness from those areas. Inceased tigthness noted, likely due to working 9 hour shift last night. Manual stretching also performed in all shoulder regions.   Occupational Therapy Assessment and Plan OT Assessment and Plan Clinical Impression Statement: A: Added cybex for proximal shoulder strengthening. Patient reported increased fatigue and tightness this visit due to overnight work shift but demonstrates improved combined shoulder abduction/internal rotation and elbow flexion/extension for increased ability to reach lower/mid back. Held theraband strengthening due to time restraints and increased fatigue. OT Plan: P: Resume work hardening exercises - ball on wall and box lifting. Increase resistance of UBE.   Goals Short Term Goals Time to Complete Short Term Goals: Other (comment) (6 weeks) Short Term Goal 1: Patient will be educated on HEP. Short Term Goal 1 Progress: Met Short Term Goal 2: Patient will increase PROM to [facility] for increased ability to don stockings. Short Term Goal 2 Progress: Met Short Term Goal 3: Patient will increase left shoulder strength to 3+/5 for  increased ability to reach and lift overhead. Short Term Goal 3 Progress: Met Short Term Goal 4: Patient will decrease pain to 4/10 during functional activities.  Short Term Goal 4 Progress: Met Short Term Goal 5: Patient will decrease fascial restrictions to min-mod in her left shoulder region. Short Term Goal 5 Progress: Met Long Term Goals Time to Complete Long Term Goals: 12 weeks Long Term Goal 1: Patient will return to prior level of I with all B/IADLs, work, and leisure activities. Long Term Goal 1 Progress: Progressing toward goal Long Term Goal 2: Patient will increase AROM in left shoulder region to WNL for increased ability to reach overhead at work. Long Term Goal 2 Progress: Progressing toward goal Long Term Goal 3: Patient will increase left shoulder strength to 5/5 for increased I with lifting bins at work. Long Term Goal 3 Progress: Progressing toward goal Long Term Goal 4: Patient will decrease left shoulder pain to 2/10 when completing household cleaning tasks. Long Term Goal 4 Progress: Progressing toward goal Long Term Goal 5: Patient will decrease fascial restrictions to minimal - trace in her left shoulder region. Long Term Goal 5 Progress: Progressing toward goal  Problem List Patient Active Problem List  Diagnosis  . CHONDROMALACIA OF PATELLA  . POPLITEAL CYST, LEFT  . FOOT PAIN  . MEDIAL MENISCUS TEAR, LEFT  . Pain in joint, shoulder region  . Muscle weakness (generalized)  . Status post rotator cuff repair    End of Session Activity Tolerance: Patient tolerated treatment well General Behavior During Session: [facility] for tasks performed Cognition: [facility] for tasks performed   [name], OTS Occupational Therapy Student Note reviewed by clinical instructor and accurately reflects treatment session.  Shirlean Mylar, OTR/L  10/15/2011, 9:01 AM

## 2011-10-18 ENCOUNTER — Ambulatory Visit (HOSPITAL_COMMUNITY)
Admission: RE | Admit: 2011-10-18 | Discharge: 2011-10-18 | Disposition: A | Discharge status: Home / Self Care | Payer: BC Managed Care – PPO | Source: Ambulatory Visit | Attending: Family Medicine | Admitting: Family Medicine

## 2011-10-18 DIAGNOSIS — Z9889 Other specified postprocedural states: Secondary | ICD-10-CM

## 2011-10-18 DIAGNOSIS — M6281 Muscle weakness (generalized): Secondary | ICD-10-CM

## 2011-10-18 DIAGNOSIS — M25519 Pain in unspecified shoulder: Secondary | ICD-10-CM

## 2011-10-18 NOTE — Progress Notes (Signed)
Occupational Therapy Treatment Patient Details  Name: Jo Hale MRN: [id number] Date of Birth: [birth year]  Today's Date: 10/18/2011 Time: 4098-1191 OT Time Calculation (min): 57 min  Manual Therapy: 4782-9562 20' Therapeutic Exercise: 0825-0902 37' Visit#: 34  of 36   Re-eval: 11/08/11   Subjective Symptoms/Limitations Symptoms: S: It feels better since I have had a few days off of work. Pain Assessment Currently in Pain?: Yes Pain Score:   1 Pain Location: Shoulder Pain Orientation: Left  Precautions/Restrictions   N/A  Exercise/Treatments Supine Protraction: PROM;10 reps;Strengthening;15 reps Protraction Weight (lbs): 2 Horizontal ABduction: PROM;10 reps;Strengthening;15 reps Horizontal ABduction Weight (lbs): 2 External Rotation: PROM;10 reps;Strengthening;15 reps;Other (comment) External Rotation Weight (lbs): 2 Internal Rotation: PROM;10 reps;Strengthening;15 reps Internal Rotation Weight (lbs): 2 Flexion: PROM;10 reps;Strengthening;15 reps Shoulder Flexion Weight (lbs): 2 ABduction: PROM;10 reps;Strengthening;15 reps Shoulder ABduction Weight (lbs): 2 Seated Extension: Theraband;15 reps Theraband Level (Shoulder Extension): Level 3 (Green) Retraction: Theraband;15 reps Theraband Level (Shoulder Retraction): Level 3 (Green) Row: Theraband;15 reps Theraband Level (Shoulder Row): Level 3 (Green) Protraction: Strengthening;15 reps Protraction Weight (lbs): 2 Horizontal ABduction: Strengthening;15 reps Horizontal ABduction Weight (lbs): 2 External Rotation: Theraband;15 reps Theraband Level (Shoulder External Rotation): Level 3 (Green) Internal Rotation: Theraband;15 reps Theraband Level (Shoulder Internal Rotation): Level 3 (Green) Flexion: Strengthening;15 reps Flexion Weight (lbs): 2 Abduction: Strengthening;15 reps ABduction Weight (lbs): 2 Other Seated Exercises: reached for back of head, then to lower back with LUE x10. Minimal facilitation  required to keep shoulder depressed/relaxed x12 Other Seated Exercises: shoulder abducted and internally rotated until end feel. Hold that position and flex/extend elbow to work on the fluidity of all 3 movements combined x 10   Therapy Ball Flexion: 25 reps ABduction: 25 reps Right/Left: 5 reps ROM / Strengthening / Isometric Strengthening UBE (Upper Arm Bike): 3' forward and 3' backwards 3.5 Cybex Press: 2 plate;10 reps Cybex Row: 2 plate;10 reps "W" Arms: 12 with 2# X to V Arms: 12 with 2# Proximal Shoulder Strengthening, Seated: dc Other ROM/Strengthening Exercises: lifted 15# box from ground level to chest level x15 reps   Manual Therapy Manual Therapy: Myofascial release Myofascial Release: MFR and massage to left scapular, trapezius, pectoral, and shoulder reiongs to decrease pain and restrictions and increase pain free AROM. Less restriction noted this visit. Manual stretching also performed in all shoulder regions.   Occupational Therapy Assessment and Plan OT Assessment and Plan Clinical Impression Statement: A: Increased weight of cybex and resistance of UBE, tolerated well. Patient able to independently depress L shoulder during seated strengthening and theraband exercises. Improvements also noted in patient's ability to combine shoulder int rot with elbow flexion/extension. OT Plan: P: Improve AROM ext/int rot (reaching for head then lower back). Attempt lifting 15# box higher for more realistic work simulation.   Goals Short Term Goals Time to Complete Short Term Goals: Other (comment) (6 weeks) Short Term Goal 1: Patient will be educated on HEP. Short Term Goal 2: Patient will increase PROM to [facility] for increased ability to don stockings. Short Term Goal 3: Patient will increase left shoulder strength to 3+/5 for increased ability to reach and lift overhead. Short Term Goal 4: Patient will decrease pain to 4/10 during functional activities. Short Term Goal 5: Patient will  decrease fascial restrictions to min-mod in her left shoulder region. Long Term Goals Time to Complete Long Term Goals: 12 weeks Long Term Goal 1: Patient will return to prior level of I with all B/IADLs, work, and leisure activities. Long Term Goal 2:  Patient will increase AROM in left shoulder region to WNL for increased ability to reach overhead at work. Long Term Goal 3: Patient will increase left shoulder strength to 5/5 for increased I with lifting bins at work. Long Term Goal 4: Patient will decrease left shoulder pain to 2/10 when completing household cleaning tasks. Long Term Goal 5: Patient will decrease fascial restrictions to minimal - trace in her left shoulder region.  Problem List Patient Active Problem List  Diagnosis  . CHONDROMALACIA OF PATELLA  . POPLITEAL CYST, LEFT  . FOOT PAIN  . MEDIAL MENISCUS TEAR, LEFT  . Pain in joint, shoulder region  . Muscle weakness (generalized)  . Status post rotator cuff repair    End of Session Activity Tolerance: Patient tolerated treatment well General Behavior During Session: [facility] for tasks performed Cognition: [facility] for tasks performed  GO   [name], OTS Occupational Therapy Student  10/18/2011, 9:08 AM

## 2011-10-18 NOTE — Progress Notes (Signed)
Occupational Therapy Treatment Patient Details  Name: Jo Hale MRN: [id number] Date of Birth: 1960-07-04  Today's Date: 10/18/2011 Time: 4098-1191 OT Time Calculation (min): 57 min  Manual Therapy: 4782-9562 20' Therapeutic Exercise: 0825-0902 37' Visit#: 34  of 36   Re-eval: 11/08/11   Subjective Symptoms/Limitations Symptoms: S: It feels better since I have had a few days off of work. Pain Assessment Currently in Pain?: Yes Pain Score:   1 Pain Location: Shoulder Pain Orientation: Left  Precautions/Restrictions   N/A  Exercise/Treatments Supine Protraction: PROM;10 reps;Strengthening;15 reps Protraction Weight (lbs): 2 Horizontal ABduction: PROM;10 reps;Strengthening;15 reps Horizontal ABduction Weight (lbs): 2 External Rotation: PROM;10 reps;Strengthening;15 reps;Other (comment) External Rotation Weight (lbs): 2 Internal Rotation: PROM;10 reps;Strengthening;15 reps Internal Rotation Weight (lbs): 2 Flexion: PROM;10 reps;Strengthening;15 reps Shoulder Flexion Weight (lbs): 2 ABduction: PROM;10 reps;Strengthening;15 reps Shoulder ABduction Weight (lbs): 2 Seated Extension: Theraband;15 reps Theraband Level (Shoulder Extension): Level 3 (Green) Retraction: Theraband;15 reps Theraband Level (Shoulder Retraction): Level 3 (Green) Row: Theraband;15 reps Theraband Level (Shoulder Row): Level 3 (Green) Protraction: Strengthening;15 reps Protraction Weight (lbs): 2 Horizontal ABduction: Strengthening;15 reps Horizontal ABduction Weight (lbs): 2 External Rotation: Theraband;15 reps Theraband Level (Shoulder External Rotation): Level 3 (Green) Internal Rotation: Theraband;15 reps Theraband Level (Shoulder Internal Rotation): Level 3 (Green) Flexion: Strengthening;15 reps Flexion Weight (lbs): 2 Abduction: Strengthening;15 reps ABduction Weight (lbs): 2 Other Seated Exercises: reached for back of head, then to lower back with LUE x10. Minimal facilitation  required to keep shoulder depressed/relaxed x12 Other Seated Exercises: shoulder abducted and internally rotated until end feel. Hold that position and flex/extend elbow to work on the fluidity of all 3 movements combined x 10   Therapy Ball Flexion: 25 reps ABduction: 25 reps Right/Left: 5 reps ROM / Strengthening / Isometric Strengthening UBE (Upper Arm Bike): 3' forward and 3' backwards 3.5 Cybex Press: 2 plate;10 reps Cybex Row: 2 plate;10 reps "W" Arms: 12 with 2# X to V Arms: 12 with 2# Proximal Shoulder Strengthening, Seated: dc Other ROM/Strengthening Exercises: lifted 15# box from ground level to chest level x15 reps   Manual Therapy Manual Therapy: Myofascial release Myofascial Release: MFR and massage to left scapular, trapezius, pectoral, and shoulder reiongs to decrease pain and restrictions and increase pain free AROM. Less restriction noted this visit. Manual stretching also performed in all shoulder regions.   Occupational Therapy Assessment and Plan OT Assessment and Plan Clinical Impression Statement: A: Increased weight of cybex and resistance of UBE, tolerated well. Patient able to independently depress L shoulder during seated strengthening and theraband exercises. Improvements also noted in patient's ability to combine shoulder int rot with elbow flexion/extension. OT Plan: P: Improve AROM ext/int rot (reaching for head then lower back). Attempt lifting 15# box higher for more realistic work simulation.   Goals Short Term Goals Time to Complete Short Term Goals: Other (comment) (6 weeks) Short Term Goal 1: Patient will be educated on HEP. Short Term Goal 2: Patient will increase PROM to [facility] for increased ability to don stockings. Short Term Goal 3: Patient will increase left shoulder strength to 3+/5 for increased ability to reach and lift overhead. Short Term Goal 4: Patient will decrease pain to 4/10 during functional activities. Short Term Goal 5: Patient will  decrease fascial restrictions to min-mod in her left shoulder region. Long Term Goals Time to Complete Long Term Goals: 12 weeks Long Term Goal 1: Patient will return to prior level of I with all B/IADLs, work, and leisure activities. Long Term Goal 2:  Patient will increase AROM in left shoulder region to WNL for increased ability to reach overhead at work. Long Term Goal 3: Patient will increase left shoulder strength to 5/5 for increased I with lifting bins at work. Long Term Goal 4: Patient will decrease left shoulder pain to 2/10 when completing household cleaning tasks. Long Term Goal 5: Patient will decrease fascial restrictions to minimal - trace in her left shoulder region.  Problem List Patient Active Problem List  Diagnosis  . CHONDROMALACIA OF PATELLA  . POPLITEAL CYST, LEFT  . FOOT PAIN  . MEDIAL MENISCUS TEAR, LEFT  . Pain in joint, shoulder region  . Muscle weakness (generalized)  . Status post rotator cuff repair    End of Session Activity Tolerance: Patient tolerated treatment well General Behavior During Session: [facility] for tasks performed Cognition: [facility] for tasks performed  GO   [name], OTS Occupational Therapy Student Note reviewed by clinical instructor and accurately reflects treatment session.  Shirlean Mylar, OTR/L  10/18/2011, 9:08 AM

## 2011-10-19 ENCOUNTER — Ambulatory Visit (HOSPITAL_COMMUNITY)
Admission: RE | Admit: 2011-10-19 | Discharge: 2011-10-19 | Disposition: A | Discharge status: Home / Self Care | Payer: BC Managed Care – PPO | Source: Ambulatory Visit | Attending: Family Medicine | Admitting: Family Medicine

## 2011-10-19 DIAGNOSIS — M25519 Pain in unspecified shoulder: Secondary | ICD-10-CM

## 2011-10-19 DIAGNOSIS — Z9889 Other specified postprocedural states: Secondary | ICD-10-CM

## 2011-10-19 DIAGNOSIS — M6281 Muscle weakness (generalized): Secondary | ICD-10-CM

## 2011-10-19 NOTE — Progress Notes (Signed)
Occupational Therapy Treatment Patient Details  Name: Jo Hale MRN: [id number] Date of Birth: 12-31-1960  Today's Date: 10/19/2011 Time: 0805-0900 OT Time Calculation (min): 55 min  Manual Therapy: 6962-9528 25' Therapeutic Exercise: 0830-0900 30' Visit#: 35  of 42   Re-eval: 11/08/11    Subjective Symptoms/Limitations Symptoms: S: I dont have any pain now but it still hurts at night. Last night I had to take a pain pill. Pain Assessment Currently in Pain?: No/denies  Precautions/Restrictions   N/A  Exercise/Treatments Supine Protraction: PROM;10 reps;Strengthening;15 reps Protraction Weight (lbs): 2 Horizontal ABduction: PROM;10 reps;Strengthening;15 reps Horizontal ABduction Weight (lbs): 2 External Rotation: PROM;10 reps;Strengthening;15 reps;Other (comment) External Rotation Weight (lbs): 2 Internal Rotation: PROM;10 reps;Strengthening;15 reps Internal Rotation Weight (lbs): 2 Flexion: PROM;10 reps;Strengthening;15 reps Shoulder Flexion Weight (lbs): 2 ABduction: PROM;10 reps;Strengthening;15 reps Shoulder ABduction Weight (lbs): 2 Seated Elevation: Other (comment) (sh depression, 15, green theraband) Extension: Theraband;15 reps Theraband Level (Shoulder Extension): Level 3 (Green) Retraction: Theraband;15 reps Theraband Level (Shoulder Retraction): Level 3 (Green) Row: Theraband;15 reps Theraband Level (Shoulder Row): Level 3 (Green) Protraction: Strengthening;15 reps Protraction Weight (lbs): 2 Horizontal ABduction: Strengthening;15 reps Horizontal ABduction Weight (lbs): 2 External Rotation: Theraband;15 reps Theraband Level (Shoulder External Rotation): Level 3 (Green) Internal Rotation: Theraband;15 reps Theraband Level (Shoulder Internal Rotation): Level 3 (Green) Flexion: Strengthening;15 reps Flexion Weight (lbs): 2 Abduction: Strengthening;15 reps ABduction Weight (lbs): 2 Other Seated Exercises: reached for back of head, then to lower  back with LUE x10. Minimal facilitation required to keep shoulder depressed/relaxed x15 Other Seated Exercises: shoulder abducted and internally rotated until end feel. Hold that position and flex/extend elbow to work on the fluidity of all 3 movements combined x 15   Therapy Ball Flexion: Other (comment) (dc) ABduction: Other (comment) (dc) Right/Left: 5 reps ROM / Strengthening / Isometric Strengthening UBE (Upper Arm Bike): 3' forward and 3' backwards 3.5 Cybex Press: 2 plate;Other (comment) (12 reps) Cybex Row: 2 plate;Other (comment) (12 reps) "W" Arms: 12 with 2# X to V Arms: 12 with 2# Other ROM/Strengthening Exercises: held today      Manual Therapy Manual Therapy: Myofascial release Myofascial Release: MFR and massage to left scapular, trapezius, pectoral, shoulder, upper arm, bilateral SCM/levator scapula regions to decrease pain and restrictions and increase pain free AROM. Gentle cervical traction also performed. Moderate restrictions noted in upper trapezius and left SCM/levator scapula regions. Manual stretching in all shoulder regions also performed.   Occupational Therapy Assessment and Plan OT Assessment and Plan Clinical Impression Statement: A: AROM of L shoulder flexion and abduction are limited by approximately 10% during supine and seated exercises. External rotation has improved so that patient can reach behind her head but patient still has minimal difficulty combining int rot, elbow flexion/extension to reach lower-mid back.  OT Plan: P: Loosen tigthness in L trap/shoulder/scap regions via MFR and manual stretching to increase L AROM even to R. Continue focus on combined int rot, elbow flex/ext. Resume work hardening exercises.   Goals Short Term Goals Time to Complete Short Term Goals: Other (comment) (6 weeks) Short Term Goal 1: Patient will be educated on HEP. Short Term Goal 1 Progress: Met Short Term Goal 2: Patient will increase PROM to [facility] for increased  ability to don stockings. Short Term Goal 2 Progress: Met Short Term Goal 3: Patient will increase left shoulder strength to 3+/5 for increased ability to reach and lift overhead. Short Term Goal 3 Progress: Met Short Term Goal 4: Patient will decrease pain to 4/10 during  functional activities. Short Term Goal 4 Progress: Met Short Term Goal 5: Patient will decrease fascial restrictions to min-mod in her left shoulder region. Short Term Goal 5 Progress: Met Long Term Goals Time to Complete Long Term Goals: 12 weeks Long Term Goal 1: Patient will return to prior level of I with all B/IADLs, work, and leisure activities. Long Term Goal 1 Progress: Progressing toward goal Long Term Goal 2: Patient will increase AROM in left shoulder region to WNL for increased ability to reach overhead at work. Long Term Goal 2 Progress: Progressing toward goal Long Term Goal 3: Patient will increase left shoulder strength to 5/5 for increased I with lifting bins at work. Long Term Goal 3 Progress: Progressing toward goal Long Term Goal 4: Patient will decrease left shoulder pain to 2/10 when completing household cleaning tasks. Long Term Goal 4 Progress: Progressing toward goal Long Term Goal 5: Patient will decrease fascial restrictions to minimal - trace in her left shoulder region. Long Term Goal 5 Progress: Progressing toward goal  Problem List Patient Active Problem List  Diagnosis  . CHONDROMALACIA OF PATELLA  . POPLITEAL CYST, LEFT  . FOOT PAIN  . MEDIAL MENISCUS TEAR, LEFT  . Pain in joint, shoulder region  . Muscle weakness (generalized)  . Status post rotator cuff repair    End of Session Activity Tolerance: Patient tolerated treatment well General Behavior During Session: [facility] for tasks performed Cognition: [facility] for tasks performed  GO   [name], OTS Occupational Therapy Student Note reviewed by clinical instructor and accurately reflects treatment session.  Shirlean Mylar, OTR/L  10/19/2011, 9:08 AM

## 2011-10-19 NOTE — Progress Notes (Signed)
Occupational Therapy Treatment Patient Details  Hale: Jo Hale MRN: [id number] Date of Birth: 06-25-1960  Today's Date: 10/19/2011 Time: 0805-0900 OT Time Calculation (min): 55 min  Manual Therapy: [id number] 25' Therapeutic Exercise: 0830-0900 30' Visit#: 35  of 42   Re-eval: 11/08/11    Subjective Symptoms/Limitations Symptoms: S: I dont have any pain now but it still hurts at night. Last night I had to take a pain pill. Pain Assessment Currently in Pain?: No/denies  Precautions/Restrictions   N/A  Exercise/Treatments Supine Protraction: PROM;10 reps;Strengthening;15 reps Protraction Weight (lbs): 2 Horizontal ABduction: PROM;10 reps;Strengthening;15 reps Horizontal ABduction Weight (lbs): 2 External Rotation: PROM;10 reps;Strengthening;15 reps;Other (comment) External Rotation Weight (lbs): 2 Internal Rotation: PROM;10 reps;Strengthening;15 reps Internal Rotation Weight (lbs): 2 Flexion: PROM;10 reps;Strengthening;15 reps Shoulder Flexion Weight (lbs): 2 ABduction: PROM;10 reps;Strengthening;15 reps Shoulder ABduction Weight (lbs): 2 Seated Elevation: Other (comment) (sh depression, 15, green theraband) Extension: Theraband;15 reps Theraband Level (Shoulder Extension): Level 3 (Green) Retraction: Theraband;15 reps Theraband Level (Shoulder Retraction): Level 3 (Green) Row: Theraband;15 reps Theraband Level (Shoulder Row): Level 3 (Green) Protraction: Strengthening;15 reps Protraction Weight (lbs): 2 Horizontal ABduction: Strengthening;15 reps Horizontal ABduction Weight (lbs): 2 External Rotation: Theraband;15 reps Theraband Level (Shoulder External Rotation): Level 3 (Green) Internal Rotation: Theraband;15 reps Theraband Level (Shoulder Internal Rotation): Level 3 (Green) Flexion: Strengthening;15 reps Flexion Weight (lbs): 2 Abduction: Strengthening;15 reps ABduction Weight (lbs): 2 Other Seated Exercises: reached for back of head, then to lower  back with LUE x10. Minimal facilitation required to keep shoulder depressed/relaxed x15 Other Seated Exercises: shoulder abducted and internally rotated until end feel. Hold that position and flex/extend elbow to work on the fluidity of all 3 movements combined x 15   Therapy Ball Flexion: Other (comment) (dc) ABduction: Other (comment) (dc) Right/Left: 5 reps ROM / Strengthening / Isometric Strengthening UBE (Upper Arm Bike): 3' forward and 3' backwards 3.5 Cybex Press: 2 plate;Other (comment) (12 reps) Cybex Row: 2 plate;Other (comment) (12 reps) "W" Arms: 12 with 2# X to V Arms: 12 with 2# Other ROM/Strengthening Exercises: held today      Manual Therapy Manual Therapy: Myofascial release Myofascial Release: MFR and massage to left scapular, trapezius, pectoral, shoulder, upper arm, bilateral SCM/levator scapula regions to decrease pain and restrictions and increase pain free AROM. Gentle cervical traction also performed. Moderate restrictions noted in upper trapezius and left SCM/levator scapula regions. Manual stretching in all shoulder regions also performed.   Occupational Therapy Assessment and Plan OT Assessment and Plan Clinical Impression Statement: A: AROM of L shoulder flexion and abduction are limited by approximately 10% during supine and seated exercises. External rotation has improved so that patient can reach behind her head but patient still has minimal difficulty combining int rot, elbow flexion/extension to reach lower-mid back.  OT Plan: P: Loosen tigthness in L trap/shoulder/scap regions via MFR and manual stretching to increase L AROM even to R. Continue focus on combined int rot, elbow flex/ext. Resume work hardening exercises.   Goals Short Term Goals Time to Complete Short Term Goals: Other (comment) (6 weeks) Short Term Goal 1: Patient will be educated on HEP. Short Term Goal 1 Progress: Met Short Term Goal 2: Patient will increase PROM to [facility] for increased  ability to don stockings. Short Term Goal 2 Progress: Met Short Term Goal 3: Patient will increase left shoulder strength to 3+/5 for increased ability to reach and lift overhead. Short Term Goal 3 Progress: Met Short Term Goal 4: Patient will decrease pain to 4/10 during  functional activities. Short Term Goal 4 Progress: Met Short Term Goal 5: Patient will decrease fascial restrictions to min-mod in her left shoulder region. Short Term Goal 5 Progress: Met Long Term Goals Time to Complete Long Term Goals: 12 weeks Long Term Goal 1: Patient will return to prior level of I with all B/IADLs, work, and leisure activities. Long Term Goal 1 Progress: Progressing toward goal Long Term Goal 2: Patient will increase AROM in left shoulder region to WNL for increased ability to reach overhead at work. Long Term Goal 2 Progress: Progressing toward goal Long Term Goal 3: Patient will increase left shoulder strength to 5/5 for increased I with lifting bins at work. Long Term Goal 3 Progress: Progressing toward goal Long Term Goal 4: Patient will decrease left shoulder pain to 2/10 when completing household cleaning tasks. Long Term Goal 4 Progress: Progressing toward goal Long Term Goal 5: Patient will decrease fascial restrictions to minimal - trace in her left shoulder region. Long Term Goal 5 Progress: Progressing toward goal  Problem List Patient Active Problem List  Diagnosis  . CHONDROMALACIA OF PATELLA  . POPLITEAL CYST, LEFT  . FOOT PAIN  . MEDIAL MENISCUS TEAR, LEFT  . Pain in joint, shoulder region  . Muscle weakness (generalized)  . Status post rotator cuff repair    End of Session Activity Tolerance: Patient tolerated treatment well General Behavior During Session: [facility] for tasks performed Cognition: [facility] for tasks performed  GO   [name], OTS Occupational Therapy Student  10/19/2011, 9:08 AM

## 2011-10-21 ENCOUNTER — Ambulatory Visit (HOSPITAL_COMMUNITY)
Admission: RE | Admit: 2011-10-21 | Discharge: 2011-10-21 | Disposition: A | Discharge status: Home / Self Care | Payer: BC Managed Care – PPO | Source: Ambulatory Visit | Attending: Family Medicine | Admitting: Family Medicine

## 2011-10-21 DIAGNOSIS — Z9889 Other specified postprocedural states: Secondary | ICD-10-CM

## 2011-10-21 DIAGNOSIS — M25519 Pain in unspecified shoulder: Secondary | ICD-10-CM

## 2011-10-21 DIAGNOSIS — M6281 Muscle weakness (generalized): Secondary | ICD-10-CM

## 2011-10-21 NOTE — Progress Notes (Signed)
Note reviewed by clinical instructor and accurately reflects treatment session.  Bethany H. Murray, OTR/L  

## 2011-10-21 NOTE — Progress Notes (Signed)
Occupational Therapy Treatment Patient Details  Name: Jo Hale MRN: [id number] Date of Birth: May 05, 1960  Today's Date: 10/21/2011 Time: 6295-2841 OT Time Calculation (min): 59 min  Manual Therapy: 3244-0102 25' Therapeutic Exercise: 7253-6644 34' Visit#: 36  of 42   Re-eval: 11/08/11    Subjective Symptoms/Limitations Symptoms: It feels good now, but I wish you could feel how it feels at night. I've been putting icy hot on it before I go to bed and it relieves it some. Pain Assessment Currently in Pain?: No/denies  Precautions/Restrictions   N/A  Exercise/Treatments Supine Protraction: PROM;10 reps;Strengthening;15 reps Protraction Weight (lbs): 2 Horizontal ABduction: PROM;10 reps;Strengthening;15 reps Horizontal ABduction Weight (lbs): 2 External Rotation: PROM;10 reps;Strengthening;15 reps;Other (comment) External Rotation Weight (lbs): 2 Internal Rotation: PROM;10 reps;Strengthening;15 reps Internal Rotation Weight (lbs): 2 Flexion: PROM;10 reps;Strengthening;15 reps Shoulder Flexion Weight (lbs): 2 ABduction: PROM;10 reps;Strengthening;15 reps Shoulder ABduction Weight (lbs): 2 Seated Elevation: Other (comment);15 reps (depression) Extension: Theraband;15 reps Theraband Level (Shoulder Extension): Level 3 (Green) Retraction: Theraband;15 reps Theraband Level (Shoulder Retraction): Level 3 (Green) Row: Theraband;15 reps Theraband Level (Shoulder Row): Level 3 (Green) Protraction: Strengthening;15 reps Protraction Weight (lbs): 2 Horizontal ABduction: Strengthening;15 reps Horizontal ABduction Weight (lbs): 2 External Rotation: Theraband;15 reps Theraband Level (Shoulder External Rotation): Level 3 (Green) Internal Rotation: Theraband;15 reps Theraband Level (Shoulder Internal Rotation): Level 3 (Green) Flexion: Strengthening;15 reps Flexion Weight (lbs): 2 Abduction: Strengthening;15 reps ABduction Weight (lbs): 2 Other Seated Exercises: reached  for back of head, then to lower back with LUE x10. Minimal facilitation required to keep shoulder depressed/relaxed x15 Other Seated Exercises: shoulder abducted and internally rotated until end feel. Hold that position and flex/extend elbow to work on the fluidity of all 3 movements combined x 15   Therapy Ball Right/Left: 5 reps ROM / Strengthening / Isometric Strengthening UBE (Upper Arm Bike): 3' forward and 3' backwards 3.5 Cybex Press: 2 plate;15 reps Cybex Row: 2 plate;15 reps "W" Arms: 12 with 2# X to V Arms: 12 with 2# Other ROM/Strengthening Exercises: lifted 15# box from floor to chest level (table up as high as it will go with 4 inch box on top) x15        Manual Therapy Manual Therapy: Myofascial release Myofascial Release: MFR and massage to left scapular, trapezius, pectoral, shoulder around scar, upper arm, SCM and levator scapula regions to decrease pain and restrictions and increase pain free AROM. Gentle cervical traction also performed.   Occupational Therapy Assessment and Plan OT Assessment and Plan Clinical Impression Statement: A:Patient only requires tactile cue to keep shoulder depressed during seated shoulder abduction with 2# weight. Combined int rot,elbow flex/ext is much improved and approximately 95% even to the right side.  Increased height of box lift this visit, patient tolerated well and demonstrates good form. OT Plan: P: Continue stretching of all shoulder movements in supine to decrease tightness and increase L AROM even to R. Continue strengthening.    Goals Short Term Goals Time to Complete Short Term Goals: Other (comment) (6 weeks) Short Term Goal 1: Patient will be educated on HEP. Short Term Goal 1 Progress: Met Short Term Goal 2: Patient will increase PROM to [facility] for increased ability to don stockings. Short Term Goal 2 Progress: Met Short Term Goal 3: Patient will increase left shoulder strength to 3+/5 for increased ability to reach and  lift overhead. Short Term Goal 3 Progress: Met Short Term Goal 4: Patient will decrease pain to 4/10 during functional activities. Short Term Goal 4 Progress:  Met Short Term Goal 5: Patient will decrease fascial restrictions to min-mod in her left shoulder region. Short Term Goal 5 Progress: Met Long Term Goals Time to Complete Long Term Goals: 12 weeks Long Term Goal 1: Patient will return to prior level of I with all B/IADLs, work, and leisure activities. Long Term Goal 1 Progress: Progressing toward goal Long Term Goal 2: Patient will increase AROM in left shoulder region to WNL for increased ability to reach overhead at work. Long Term Goal 2 Progress: Progressing toward goal Long Term Goal 3: Patient will increase left shoulder strength to 5/5 for increased I with lifting bins at work. Long Term Goal 3 Progress: Progressing toward goal Long Term Goal 4: Patient will decrease left shoulder pain to 2/10 when completing household cleaning tasks. Long Term Goal 4 Progress: Progressing toward goal Long Term Goal 5: Patient will decrease fascial restrictions to minimal - trace in her left shoulder region. Long Term Goal 5 Progress: Progressing toward goal  Problem List Patient Active Problem List  Diagnosis  . CHONDROMALACIA OF PATELLA  . POPLITEAL CYST, LEFT  . FOOT PAIN  . MEDIAL MENISCUS TEAR, LEFT  . Pain in joint, shoulder region  . Muscle weakness (generalized)  . Status post rotator cuff repair    End of Session Activity Tolerance: Patient tolerated treatment well General Behavior During Session: [facility] for tasks performed Cognition: [facility] for tasks performed  GO   [name], OTS Occupational Therapy Student  10/21/2011, 9:07 AM

## 2011-10-25 ENCOUNTER — Ambulatory Visit (HOSPITAL_COMMUNITY)
Admission: RE | Admit: 2011-10-25 | Discharge: 2011-10-25 | Disposition: A | Discharge status: Home / Self Care | Payer: BC Managed Care – PPO | Source: Ambulatory Visit | Attending: Specialist | Admitting: Specialist

## 2011-10-25 DIAGNOSIS — M6281 Muscle weakness (generalized): Secondary | ICD-10-CM

## 2011-10-25 DIAGNOSIS — Z9889 Other specified postprocedural states: Secondary | ICD-10-CM

## 2011-10-25 DIAGNOSIS — M25519 Pain in unspecified shoulder: Secondary | ICD-10-CM

## 2011-10-25 NOTE — Progress Notes (Signed)
Note reviewed by clinical instructor and accurately reflects treatment session.  Shirlean Mylar, OTR/L

## 2011-10-25 NOTE — Progress Notes (Signed)
Occupational Therapy Treatment Patient Details  Name: Jo Hale MRN: [id number] Date of Birth: 1960-05-18  Today's Date: 10/25/2011 Time: 4098-1191 OT Time Calculation (min): 50 min  Manual Therapy: 4782-9562 30' Therapeutic Exercise: 1308-6578 20' Visit#: 37  of 42   Re-eval: 11/08/11    Subjective Symptoms/Limitations Symptoms: S: I go back to work tonight, I know it is going to make this arm tired. Pain Assessment Currently in Pain?: Yes Pain Score:   2 Pain Location: Shoulder Pain Orientation: Left Pain Type: Acute pain  Precautions/Restrictions   N/A  Exercise/Treatments Supine Protraction: PROM;10 reps;Strengthening;15 reps Protraction Weight (lbs): 2 Horizontal ABduction: PROM;10 reps;Strengthening;15 reps Horizontal ABduction Weight (lbs): 2 External Rotation: PROM;10 reps;Strengthening;15 reps;Other (comment) External Rotation Weight (lbs): 2 Internal Rotation: PROM;10 reps;Strengthening;15 reps Internal Rotation Weight (lbs): 2 Flexion: PROM;10 reps;Strengthening;15 reps Shoulder Flexion Weight (lbs): 2 ABduction: PROM;10 reps;Strengthening;15 reps Shoulder ABduction Weight (lbs): 2 Seated Protraction: Strengthening;15 reps Protraction Weight (lbs): 2 Horizontal ABduction: Strengthening;15 reps Horizontal ABduction Weight (lbs): 2 Flexion: Strengthening;15 reps Flexion Weight (lbs): 2 Abduction: Strengthening;15 reps ABduction Weight (lbs): 2   Standing Other Standing Exercises: reached for back of head, then to lower back with LUE x15. Other Standing Exercises: shoulder abducted and internally rotated until end feel. Hold that position and flex/extend elbow to work on the fluidity of all 3 movements combined x 15 (required cueing for proper positioning)   ROM / Strengthening / Isometric Strengthening UBE (Upper Arm Bike): 3' forward and 3' backwards 3.5 Cybex Press: 2 plate;15 reps Cybex Row: 2 plate;15 reps "W" Arms: 15 with 2# X to V  Arms: 15 with 2#     Manual Therapy Manual Therapy: Myofascial release Myofascial Release: MFR and massage to left scapular, trapezius, upper arm, anterior shoulder, SCM, levator scapula regions to decrease pain and restrictions and increase pain free AROM. Gentle cervical traction also performed to lessen stiffness in cervical and upper trap regions.  Occupational Therapy Assessment and Plan OT Assessment and Plan Clinical Impression Statement: A: Increased time spent on MFR this visit as patient had increased tightness and restrictions. She reported improvement after manual therapy. Required cueing during functional ext/int rotation exercises to maintain proper form. OT Plan: P: Decrease fascial restrictions and tightness for improved ROM. Resume work hardening exercises.    Goals Short Term Goals Time to Complete Short Term Goals: Other (comment) (6 weeks) Short Term Goal 1: Patient will be educated on HEP. Short Term Goal 1 Progress: Met Short Term Goal 2: Patient will increase PROM to [facility] for increased ability to don stockings. Short Term Goal 2 Progress: Met Short Term Goal 3: Patient will increase left shoulder strength to 3+/5 for increased ability to reach and lift overhead. Short Term Goal 3 Progress: Met Short Term Goal 4: Patient will decrease pain to 4/10 during functional activities. Short Term Goal 4 Progress: Met Short Term Goal 5: Patient will decrease fascial restrictions to min-mod in her left shoulder region. Short Term Goal 5 Progress: Met Long Term Goals Time to Complete Long Term Goals: 12 weeks Long Term Goal 1: Patient will return to prior level of I with all B/IADLs, work, and leisure activities. Long Term Goal 1 Progress: Progressing toward goal Long Term Goal 2: Patient will increase AROM in left shoulder region to WNL for increased ability to reach overhead at work. Long Term Goal 2 Progress: Progressing toward goal Long Term Goal 3: Patient will increase  left shoulder strength to 5/5 for increased I with lifting bins at  work. Long Term Goal 3 Progress: Progressing toward goal Long Term Goal 4: Patient will decrease left shoulder pain to 2/10 when completing household cleaning tasks. Long Term Goal 4 Progress: Progressing toward goal Long Term Goal 5: Patient will decrease fascial restrictions to minimal - trace in her left shoulder region. Long Term Goal 5 Progress: Progressing toward goal  Problem List Patient Active Problem List  Diagnosis  . CHONDROMALACIA OF PATELLA  . POPLITEAL CYST, LEFT  . FOOT PAIN  . MEDIAL MENISCUS TEAR, LEFT  . Pain in joint, shoulder region  . Muscle weakness (generalized)  . Status post rotator cuff repair    End of Session Activity Tolerance: Patient tolerated treatment well General Behavior During Session: [facility] for tasks performed Cognition: [facility] for tasks performed  GO   [name], OTS Occupational Therapy Student  10/25/2011, 10:05 AM

## 2011-10-26 ENCOUNTER — Ambulatory Visit (HOSPITAL_COMMUNITY)
Admission: RE | Admit: 2011-10-26 | Discharge: 2011-10-26 | Disposition: A | Discharge status: Home / Self Care | Payer: BC Managed Care – PPO | Source: Ambulatory Visit | Attending: Family Medicine | Admitting: Family Medicine

## 2011-10-26 DIAGNOSIS — Z9889 Other specified postprocedural states: Secondary | ICD-10-CM

## 2011-10-26 DIAGNOSIS — M25519 Pain in unspecified shoulder: Secondary | ICD-10-CM

## 2011-10-26 DIAGNOSIS — M6281 Muscle weakness (generalized): Secondary | ICD-10-CM

## 2011-10-26 NOTE — Progress Notes (Signed)
Occupational Therapy Treatment Patient Details  Name: Jo Hale MRN: [id number] Date of Birth: 10-09-1960  Today's Date: 10/26/2011 Time: 0800-0908 OT Time Calculation (min): 68 min Manual Therapy [phone] 22' Therapeutic Exercises [phone] 36' Visit#: 38  of 42   Re-eval: 11/08/11 (complete 11/05/11)    Subjective Symptoms/Limitations Symptoms: S:  It feels like it gets a catch in it across the top of the shoulder. Pain Assessment Currently in Pain?: Yes Pain Score:   2 Pain Location: Shoulder Pain Orientation: Left Pain Type: Acute pain  Precautions/Restrictions   N/A  Exercise/Treatments Supine Protraction: PROM;Strengthening;10 reps Protraction Weight (lbs): 3 Horizontal ABduction: PROM;Strengthening;10 reps Horizontal ABduction Weight (lbs): 3 External Rotation: PROM;Strengthening;10 reps External Rotation Weight (lbs): 3 Internal Rotation: PROM;Strengthening;10 reps Internal Rotation Weight (lbs): 3  Flexion: PROM;Strengthening;10 reps Shoulder Flexion Weight (lbs): 3 ABduction: PROM;Strengthening;10 reps Shoulder ABduction Weight (lbs): 3 Seated Protraction: Strengthening;10 reps Protraction Weight (lbs): 3 Horizontal ABduction: Strengthening;15 reps Horizontal ABduction Weight (lbs): 2 Flexion: Strengthening;15 reps Flexion Weight (lbs): 2 Flexion Limitations: tactile cuing at scapula to remain depressed and at elbow for increased extension during flexion.  Completed several reps in front of mirror so that patient could attempt to self correct movement.  With cuing at elbow and upper arm to maintain alignment of arm, patient was able to decrease severity of compensatory movements. Abduction: Strengthening;15 reps ABduction Weight (lbs): 2   ROM / Strengthening / Isometric Strengthening UBE (Upper Arm Bike): 3' forward and 3' backwards 4.0 Cybex Press: 2.5 plate;15 reps Cybex Row: 2.5 plate;15 reps Over Head Lace: 2# x 3' with vg to visualize  reaching under a bar prior to reaching up to rope to avoid shoulder elevation  Other ROM/Strengthening Exercises: placed items in and out of kitchen cabinet with 2# weight on wrist, focusing on depressing scapula while flexing shoulder to reach (recommended visualizing a bar that she had to reach under before reaching up to the shelf.  Completed 15 reps Other ROM/Strengthening Exercises: lifted 15# box from floor to chest level (table up as high as it will go with 4 inch box on top) x20      Manual Therapy Manual Therapy: Myofascial release Myofascial Release: MFR and manual stretching to left scapular, trapezius, upper arm, anterior shoulder, superior shoulder regions to decrease pain and restrictions and increase pain free AROM.  [phone]  Occupational Therapy Assessment and Plan OT Assessment and Plan Clinical Impression Statement: A:  Added several functional overhead reaching exercises combined with a 2# weight to target strengthening while completing a functional task.  Required tactile cuing and vg to depress scapula while reaching forward.  Instructing patient to visualize reaching under a bar before reaching up (flexing) was a great technique that allowed Jo Hale to maintain depressed scapular position during functional overhead activiites. OT Plan: P:  Patient will increase ability to maintain depressed scapular position in combination of overhead reaching without vg and tactile cuing from therapist.  DC supine strengthening and add prone strengthening.   Goals Short Term Goals Time to Complete Short Term Goals: Other (comment) (6 weeks) Short Term Goal 1: Patient will be educated on HEP. Short Term Goal 2: Patient will increase PROM to [facility] for increased ability to don stockings. Short Term Goal 3: Patient will increase left shoulder strength to 3+/5 for increased ability to reach and lift overhead. Short Term Goal 4: Patient will decrease pain to 4/10 during functional  activities. Short Term Goal 5: Patient will decrease fascial restrictions to min-mod in her  left shoulder region. Long Term Goals Time to Complete Long Term Goals: 12 weeks Long Term Goal 1: Patient will return to prior level of I with all B/IADLs, work, and leisure activities. Long Term Goal 2: Patient will increase AROM in left shoulder region to WNL for increased ability to reach overhead at work. Long Term Goal 3: Patient will increase left shoulder strength to 5/5 for increased I with lifting bins at work. Long Term Goal 4: Patient will decrease left shoulder pain to 2/10 when completing household cleaning tasks. Long Term Goal 5: Patient will decrease fascial restrictions to minimal - trace in her left shoulder region.  Problem List Patient Active Problem List  Diagnosis  . CHONDROMALACIA OF PATELLA  . POPLITEAL CYST, LEFT  . FOOT PAIN  . MEDIAL MENISCUS TEAR, LEFT  . Pain in joint, shoulder region  . Muscle weakness (generalized)  . Status post rotator cuff repair    End of Session Activity Tolerance: Patient tolerated treatment well General Behavior During Session: [facility] for tasks performed Cognition: [facility] for tasks performed  GO    Shirlean Mylar, OTR/L  10/26/2011, 9:21 AM

## 2011-10-28 ENCOUNTER — Ambulatory Visit (HOSPITAL_COMMUNITY)
Admission: RE | Admit: 2011-10-28 | Discharge: 2011-10-28 | Disposition: A | Discharge status: Home / Self Care | Payer: BC Managed Care – PPO | Source: Ambulatory Visit | Attending: Orthopedic Surgery | Admitting: Orthopedic Surgery

## 2011-10-28 DIAGNOSIS — M6281 Muscle weakness (generalized): Secondary | ICD-10-CM

## 2011-10-28 DIAGNOSIS — M25519 Pain in unspecified shoulder: Secondary | ICD-10-CM

## 2011-10-28 DIAGNOSIS — Z9889 Other specified postprocedural states: Secondary | ICD-10-CM

## 2011-10-28 NOTE — Progress Notes (Signed)
Note reviewed by clinical instructor and accurately reflects treatment session.  Jaquay Morneault H. Shanyah Gattuso, OTR/L  

## 2011-10-28 NOTE — Progress Notes (Signed)
Occupational Therapy Treatment Patient Details  Name: Jo Hale MRN: [id number] Date of Birth: 13-May-1960  Today's Date: 10/28/2011 Time: 0865-7846 OT Time Calculation (min): 45 min  Manual Therapy: 9629-5284 20' Therapeutic Exercise: 1324-4010 25' Visit#: 39  of 42   Re-eval: 11/08/11    Subjective Symptoms/Limitations Symptoms: S: I liked the new exercises. It made it feel looser afterwards and I slept through the night that night.  Pain Assessment Currently in Pain?: Yes Pain Score:   1 Pain Location: Shoulder Pain Orientation: Left Pain Type: Acute pain  Precautions/Restrictions   N/A  Exercise/Treatments Supine Protraction: PROM;10 reps Horizontal ABduction: PROM;10 reps External Rotation: PROM;10 reps Internal Rotation: PROM;10 reps Flexion: PROM;10 reps ABduction: PROM;10 reps Seated Protraction: Strengthening;15 reps Protraction Weight (lbs): 2 Horizontal ABduction: Strengthening;15 reps Horizontal ABduction Weight (lbs): 2 Flexion: Strengthening;15 reps Flexion Weight (lbs): 2 Abduction: Strengthening;15 reps ABduction Weight (lbs): 2 Prone  Flexion: Strengthening;10 reps;Weights Flexion Weight (lbs): 2# Extension: Strengthening;10 reps;Weights Extension Weight (lbs): 2# External Rotation: Strengthening;10 reps;Weights External Rotation Weight (lbs): 2# Internal Rotation: Strengthening;10 reps;Weights Internal Rotation Weight (lbs): 2# Horizontal ABduction 1: Strengthening;10 reps;Weights Horizontal ABduction 1 Weight (lbs): 2# Horizontal ABduction 2: Strengthening;10 reps;Weights Horizontal ABduction 2 Weight (lbs): 2#   ROM / Strengthening / Isometric Strengthening UBE (Upper Arm Bike): 3' forward and 3' backwards 3.5 Cybex Press: 2.5 plate;15 reps Cybex Row: 2.5 plate;15 reps Over Head Lace: 3' with 2# Other ROM/Strengthening Exercises: placed items in and out of kitchen cabinet with 2# weight on wrist, focusing on depressing scapula  while flexing shoulder to reach (recommended visualizing a bar that she had to reach under before reaching up to the shelf.  Completed for 2'    Manual Therapy Manual Therapy: Myofascial release Myofascial Release: MFR and manual stretching to left scapular, trapezius, upper arm, and anterior shoulder regions to decrease pain and restrictions and increase pain free AROM.  Occupational Therapy Assessment and Plan OT Assessment and Plan Clinical Impression Statement: A: Replaced supine strengthening with prone, but patient unable to lay prone comfortably so simulated prone by having her stand up and bend trunk to 90 degrees. Limitations with flexion in prone. Patient kept scapula depressed with only minimal cueing when reaching into cabinet and doing overhead lacing exercise. OT Plan: P: Continue functional activities required patient to reach overhead and keep scapular depressed.    Goals Short Term Goals Time to Complete Short Term Goals: Other (comment) (6 weeks) Short Term Goal 1: Patient will be educated on HEP. Short Term Goal 1 Progress: Met Short Term Goal 2: Patient will increase PROM to [facility] for increased ability to don stockings. Short Term Goal 2 Progress: Met Short Term Goal 3: Patient will increase left shoulder strength to 3+/5 for increased ability to reach and lift overhead. Short Term Goal 3 Progress: Met Short Term Goal 4: Patient will decrease pain to 4/10 during functional activities. Short Term Goal 4 Progress: Met Short Term Goal 5: Patient will decrease fascial restrictions to min-mod in her left shoulder region. Short Term Goal 5 Progress: Met Long Term Goals Time to Complete Long Term Goals: 12 weeks Long Term Goal 1: Patient will return to prior level of I with all B/IADLs, work, and leisure activities. Long Term Goal 1 Progress: Progressing toward goal Long Term Goal 2: Patient will increase AROM in left shoulder region to WNL for increased ability to reach  overhead at work. Long Term Goal 2 Progress: Progressing toward goal Long Term Goal 3: Patient will increase  left shoulder strength to 5/5 for increased I with lifting bins at work. Long Term Goal 3 Progress: Progressing toward goal Long Term Goal 4: Patient will decrease left shoulder pain to 2/10 when completing household cleaning tasks. Long Term Goal 4 Progress: Progressing toward goal Long Term Goal 5: Patient will decrease fascial restrictions to minimal - trace in her left shoulder region. Long Term Goal 5 Progress: Progressing toward goal  Problem List Patient Active Problem List  Diagnosis  . CHONDROMALACIA OF PATELLA  . POPLITEAL CYST, LEFT  . FOOT PAIN  . MEDIAL MENISCUS TEAR, LEFT  . Pain in joint, shoulder region  . Muscle weakness (generalized)  . Status post rotator cuff repair    End of Session Activity Tolerance: Patient tolerated treatment well General Behavior During Session: [facility] for tasks performed Cognition: [facility] for tasks performed  GO   [name], OTS Occupational Therapy Student  10/28/2011, 9:51 AM

## 2011-11-01 ENCOUNTER — Ambulatory Visit (HOSPITAL_COMMUNITY)
Admission: RE | Admit: 2011-11-01 | Discharge: 2011-11-01 | Disposition: A | Discharge status: Home / Self Care | Payer: BC Managed Care – PPO | Source: Ambulatory Visit | Attending: Family Medicine | Admitting: Family Medicine

## 2011-11-01 NOTE — Progress Notes (Signed)
Note reviewed by clinical instructor and accurately reflects treatment session.  Bethany H. Murray, OTR/L  

## 2011-11-01 NOTE — Progress Notes (Signed)
Occupational Therapy Treatment Patient Details  Name: BETTINA WARN MRN: [id number] Date of Birth: March 23, 1961  Today's Date: 11/01/2011 Time: 0812-0902 OT Time Calculation (min): 50 min  Manual Therapy: [id number] 26' Therapeutic Exercise: 9629-5284 24' Visit#: 40  of 42   Re-eval: 11/08/11    Subjective Symptoms/Limitations Symptoms: S: I woke up with tigthness and pain last night in my upper arm. Pain Assessment Currently in Pain?: Yes Pain Score:   1 Pain Location: Shoulder Pain Orientation: Left Pain Type: Acute pain  Precautions/Restrictions   N/A  Exercise/Treatments Supine Protraction: PROM;15 reps Horizontal ABduction: PROM;15 reps External Rotation: PROM;15 reps Internal Rotation: PROM;15 reps Flexion: PROM;15 reps ABduction: PROM;15 reps Seated Protraction: Strengthening;10 reps Protraction Weight (lbs): 3 Horizontal ABduction: Strengthening;10 reps Horizontal ABduction Weight (lbs): 3 Flexion: Strengthening;10 reps Flexion Weight (lbs): 3 Abduction: Strengthening;10 reps ABduction Weight (lbs): 3 Prone  Flexion: Strengthening;10 reps Flexion Weight (lbs): 2 Extension: Strengthening;12 reps Extension Weight (lbs): 2 External Rotation: Strengthening;12 reps External Rotation Weight (lbs): 2# Internal Rotation: Strengthening;12 reps Internal Rotation Weight (lbs): 2# Horizontal ABduction 1: Strengthening;12 reps Horizontal ABduction 1 Weight (lbs): 2# Horizontal ABduction 2: Strengthening;12 reps Horizontal ABduction 2 Weight (lbs): 2#   ROM / Strengthening / Isometric Strengthening UBE (Upper Arm Bike): 3' forward and 3' backwards 3.5 Cybex Press: 2.5 plate;15 reps Cybex Row: 2.5 plate;15 reps Over Head Lace: 3' with 2# Other ROM/Strengthening Exercises: dc Other ROM/Strengthening Exercises: lifted medium sized box with 17.5# from ground to raised table x12. Verbal cueing for proper body mechanics provided.        Manual Therapy Manual  Therapy: Myofascial release Myofascial Release: MFR and manual stretching to left scapular, trapezius, anterior shoulder and upper arm regions to decrease pain and restrictions and increase pain free AROM.  Occupational Therapy Assessment and Plan OT Assessment and Plan Clinical Impression Statement: A: Increased reps of prone strengthening and weight of seated strengthening. Patient kept scapula depressed and shoulder relaxed with minimal tactile cueing.  OT Plan: P: Continue strengthening for increased independence with household and work related tasks. Increase reps of box lifting and independence with proper body mechanics.   Goals Short Term Goals Time to Complete Short Term Goals: Other (comment) (6 weeks) Short Term Goal 1: Patient will be educated on HEP. Short Term Goal 2: Patient will increase PROM to [facility] for increased ability to don stockings. Short Term Goal 3: Patient will increase left shoulder strength to 3+/5 for increased ability to reach and lift overhead. Short Term Goal 4: Patient will decrease pain to 4/10 during functional activities. Short Term Goal 5: Patient will decrease fascial restrictions to min-mod in her left shoulder region. Long Term Goals Time to Complete Long Term Goals: 12 weeks Long Term Goal 1: Patient will return to prior level of I with all B/IADLs, work, and leisure activities. Long Term Goal 2: Patient will increase AROM in left shoulder region to WNL for increased ability to reach overhead at work. Long Term Goal 3: Patient will increase left shoulder strength to 5/5 for increased I with lifting bins at work. Long Term Goal 4: Patient will decrease left shoulder pain to 2/10 when completing household cleaning tasks. Long Term Goal 5: Patient will decrease fascial restrictions to minimal - trace in her left shoulder region.  Problem List Patient Active Problem List  Diagnosis  . CHONDROMALACIA OF PATELLA  . POPLITEAL CYST, LEFT  . FOOT PAIN  .  MEDIAL MENISCUS TEAR, LEFT  . Pain in joint, shoulder region  .  Muscle weakness (generalized)  . Status post rotator cuff repair    End of Session Activity Tolerance: Patient tolerated treatment well General Behavior During Session: [facility] for tasks performed Cognition: [facility] for tasks performed  GO   [name], OTS Occupational Therapy Student  11/01/2011, 10:25 AM

## 2011-11-02 ENCOUNTER — Ambulatory Visit (HOSPITAL_COMMUNITY)
Admission: RE | Admit: 2011-11-02 | Discharge: 2011-11-02 | Disposition: A | Discharge status: Home / Self Care | Payer: BC Managed Care – PPO | Source: Ambulatory Visit | Attending: Family Medicine | Admitting: Family Medicine

## 2011-11-02 DIAGNOSIS — M25519 Pain in unspecified shoulder: Secondary | ICD-10-CM

## 2011-11-02 DIAGNOSIS — Z9889 Other specified postprocedural states: Secondary | ICD-10-CM

## 2011-11-02 DIAGNOSIS — M6281 Muscle weakness (generalized): Secondary | ICD-10-CM

## 2011-11-02 NOTE — Progress Notes (Signed)
Occupational Therapy Treatment Patient Details  Name: Jo Hale MRN: [id number] Date of Birth: 1961-02-10  Today's Date: 11/02/2011 Time: 6295-2841 OT Time Calculation (min): 42 min Manual Therapy [phone] 22' Therapeutic Exercises [phone] 20' Visit#: 41  of 42   Re-eval: 11/08/11 (complete on 11/05/11)     Subjective S:  I have the most difficult time reaching into high overhead cabinets and reaching behind to pull up my stockings. Pain Assessment Currently in Pain?: Yes Pain Score:   1 Pain Location: Shoulder Pain Orientation: Left Pain Type: Acute pain  Precautions/Restrictions   N/A  Exercise/Treatments Supine Protraction: PROM;10 reps Horizontal ABduction: PROM;10 reps External Rotation: PROM;10 reps Internal Rotation: PROM;10 reps Flexion: PROM;10 reps ABduction: PROM;10 reps Standing Other Standing Exercises: stood in front of mirror, flexed BUE to end range, OTR/L then passively flexed LUE so that it would be in alignment with RUE (approx 10 degrees), then instructed patient to maintain this position x 10".  Completed x 5 reps.  Then modified exercise so that palms were touching in midline as she flexed her arms x 5 reps.  Had minimal difficulty maintaining full flexion in her left arm once OTR/L stopped facilitating the movement. Other Standing Exercises: stood in front of mirror, reached behind back to grab ball from therapist (into IR), then reached into full flexion to hand ball to therapist x 15 reps.  Held ball above at ear level behind shoulder, patient reached for the ball and then handed ball to OTR/L standing in front of patient with arm flexed to end range x 15. ROM / Strengthening / Isometric Strengthening UBE (Upper Arm Bike): 3' forward and 3' backwards x 4.0 Cybex Press: 3 plate;15 reps Cybex Row: 3 plate;15 reps      Manual Therapy Manual Therapy: Myofascial release Myofascial Release: MFR and manual stretching to left scapular, trapezius,  anterior shoulder, and upper arm regions to decrease pain and restrictions and increase pain free AROM.  [phone]  Occupational Therapy Assessment and Plan OT Assessment and Plan Clinical Impression Statement: A:  Held most strengthening exercises that we have been doing to focus on increasing end range flexion with overhead reaching.  Patient has difficulty with the last 10 degrees of motion, partly because her scapula tends to elevate.  With vg, she is able to maintain a depressed scapula through the end range of movement. OT Plan: P:  Reassess, add functional end range activities to HEP.     Goals Short Term Goals Time to Complete Short Term Goals: Other (comment) (6 weeks) Short Term Goal 1: Patient will be educated on HEP. Short Term Goal 2: Patient will increase PROM to [facility] for increased ability to don stockings. Short Term Goal 3: Patient will increase left shoulder strength to 3+/5 for increased ability to reach and lift overhead. Short Term Goal 4: Patient will decrease pain to 4/10 during functional activities. Short Term Goal 5: Patient will decrease fascial restrictions to min-mod in her left shoulder region. Long Term Goals Time to Complete Long Term Goals: 12 weeks Long Term Goal 1: Patient will return to prior level of I with all B/IADLs, work, and leisure activities. Long Term Goal 1 Progress: Progressing toward goal Long Term Goal 2: Patient will increase AROM in left shoulder region to WNL for increased ability to reach overhead at work. Long Term Goal 2 Progress: Progressing toward goal Long Term Goal 3: Patient will increase left shoulder strength to 5/5 for increased I with lifting bins at work. Long Term  Goal 3 Progress: Progressing toward goal Long Term Goal 4: Patient will decrease left shoulder pain to 2/10 when completing household cleaning tasks. Long Term Goal 4 Progress: Progressing toward goal Long Term Goal 5: Patient will decrease fascial restrictions to  minimal - trace in her left shoulder region. Long Term Goal 5 Progress: Progressing toward goal  Problem List Patient Active Problem List  Diagnosis  . CHONDROMALACIA OF PATELLA  . POPLITEAL CYST, LEFT  . FOOT PAIN  . MEDIAL MENISCUS TEAR, LEFT  . Pain in joint, shoulder region  . Muscle weakness (generalized)  . Status post rotator cuff repair    End of Session Activity Tolerance: Patient tolerated treatment well General Behavior During Session: [facility] for tasks performed Cognition: [facility] for tasks performed  GO    Shirlean Mylar, OTR/L  11/02/2011, 9:30 AM

## 2011-11-04 ENCOUNTER — Ambulatory Visit (HOSPITAL_COMMUNITY)
Admission: RE | Admit: 2011-11-04 | Discharge: 2011-11-04 | Disposition: A | Discharge status: Home / Self Care | Payer: BC Managed Care – PPO | Source: Ambulatory Visit | Attending: Family Medicine | Admitting: Family Medicine

## 2011-11-04 DIAGNOSIS — Z9889 Other specified postprocedural states: Secondary | ICD-10-CM

## 2011-11-04 DIAGNOSIS — M25519 Pain in unspecified shoulder: Secondary | ICD-10-CM

## 2011-11-04 DIAGNOSIS — M6281 Muscle weakness (generalized): Secondary | ICD-10-CM

## 2011-11-04 NOTE — Progress Notes (Signed)
Occupational Therapy Treatment Patient Details  Name: Jo Hale MRN: [id number] Date of Birth: 1961-01-29  Today's Date: 11/04/2011 Time: 2952-8413 OT Time Calculation (min): 46 min Manual Therapy [phone] 24' Reassessment [phone] 24' Visit#: 42  of 42   Re-eval: 11/04/11    Subjective S:  I have thought about it, and I think as long as I have exercises to do, I am ready to do my therapy as a HEP. Special Tests: UEFI 83%, was 90% patient reports difficulty deciding how to answer some of the questions this date. Pain Assessment Currently in Pain?: Yes Pain Score:   1 Pain Location: Shoulder Pain Orientation: Left Pain Type: Acute pain  Precautions/Restrictions   N/A  Exercise/Treatments Supine Protraction: PROM;10 reps Horizontal ABduction: PROM;10 reps External Rotation: PROM;10 reps Internal Rotation: PROM;10 reps Flexion: PROM;10 reps ABduction: PROM;10 reps Stretches Internal Rotation Stretch:  (reviewed for HEP) External Rotation Stretch:  (reviewed for HEP) Wall Stretch - Flexion:  (reviewed for HEP) Wall Stretch - ABduction:  (reviewed for HEP) Other Shoulder Stretches: Hands together with elbows extended, flex arms overhead, seperate hands, slowly brings arms to sides    Manual Therapy Manual Therapy: Myofascial release Myofascial Release: MFR and manual stretching to left scapular, trapezius, anterior shoulder, and upper arm region to decrease pain and restrictions and increase pain free AROM.  [phone]   Occupational Therapy Assessment and Plan OT Assessment and Plan Clinical Impression Statement: A:  Please refer to dc summary for details.  Mrs. Sanks has met all of her short and long term goals.  She has the most difficulty maintaining scapular depression  while flexing and abducting her shoulder.  We have designed a HEP to continue to work on correcting this problem. OT Plan: DC this date with HEP.   Goals Short Term Goals Time to Complete  Short Term Goals: Other (comment) (6 weeks) Short Term Goal 1: Patient will be educated on HEP. Short Term Goal 2: Patient will increase PROM to [facility] for increased ability to don stockings. Short Term Goal 3: Patient will increase left shoulder strength to 3+/5 for increased ability to reach and lift overhead. Short Term Goal 4: Patient will decrease pain to 4/10 during functional activities. Short Term Goal 5: Patient will decrease fascial restrictions to min-mod in her left shoulder region. Long Term Goals Time to Complete Long Term Goals: 12 weeks Long Term Goal 1: Patient will return to prior level of I with all B/IADLs, work, and leisure activities. Long Term Goal 1 Progress: Met Long Term Goal 2: Patient will increase AROM in left shoulder region to WNL for increased ability to reach overhead at work. Long Term Goal 2 Progress: Met Long Term Goal 3: Patient will increase left shoulder strength to 5/5 for increased I with lifting bins at work. Long Term Goal 3 Progress: Met Long Term Goal 4: Patient will decrease left shoulder pain to 2/10 when completing household cleaning tasks. Long Term Goal 4 Progress: Met Long Term Goal 5: Patient will decrease fascial restrictions to minimal - trace in her left shoulder region. Long Term Goal 5 Progress: Met  Problem List Patient Active Problem List  Diagnosis  . CHONDROMALACIA OF PATELLA  . POPLITEAL CYST, LEFT  . FOOT PAIN  . MEDIAL MENISCUS TEAR, LEFT  . Pain in joint, shoulder region  . Muscle weakness (generalized)  . Status post rotator cuff repair    End of Session Activity Tolerance: Patient tolerated treatment well General Behavior During Session: [facility] for tasks  performed Cognition: [facility] for tasks performed OT Plan of Care OT Home Exercise Plan: stretches for internal rotation, external rotation, flexion and abduction.  Visualization exercise for maintaining scapular depression while flexing and abducting.  Consulted and Agree  with Plan of Care: Patient  GO    Shirlean Mylar, OTR/L  11/04/2011, 11:44 AM

## 2012-01-17 ENCOUNTER — Other Ambulatory Visit: Payer: Self-pay | Admitting: Family Medicine

## 2012-01-17 DIAGNOSIS — Z139 Encounter for screening, unspecified: Secondary | ICD-10-CM

## 2012-01-27 ENCOUNTER — Encounter: Payer: Self-pay | Admitting: Gastroenterology

## 2012-01-28 ENCOUNTER — Encounter: Payer: Self-pay | Admitting: Urgent Care

## 2012-01-28 ENCOUNTER — Ambulatory Visit (INDEPENDENT_AMBULATORY_CARE_PROVIDER_SITE_OTHER): Payer: BC Managed Care – PPO | Admitting: Urgent Care

## 2012-01-28 VITALS — BP 126/73 | HR 81 | Temp 98.5°F | Ht 62.0 in | Wt 225.0 lb

## 2012-01-28 DIAGNOSIS — K5900 Constipation, unspecified: Secondary | ICD-10-CM | POA: Insufficient documentation

## 2012-01-28 DIAGNOSIS — K625 Hemorrhage of anus and rectum: Secondary | ICD-10-CM | POA: Insufficient documentation

## 2012-01-28 MED ORDER — POLYETHYLENE GLYCOL 3350 17 GM/SCOOP PO POWD: 17.0000 g | Freq: Every day | ORAL | Status: DC

## 2012-01-28 MED ORDER — HYDROCORTISONE ACETATE 25 MG RE SUPP: 25.0000 mg | Freq: Two times a day (BID) | RECTAL | Status: DC

## 2012-01-28 NOTE — Patient Instructions (Addendum)
Limit use of ibuprofen & anti-inflammatories. Miralax 17 grams daily for constipation.  Hold if diarrhea. Anusol HC suppositories twice daily   Rectal Bleeding Rectal bleeding is when blood passes out of the anus. It is usually a sign that something is wrong. It may not be serious, but it should always be evaluated. Rectal bleeding may present as bright red blood or extremely dark stools. The color may range from dark red or maroon to black (like tar). It is important that the cause of rectal bleeding be identified so treatment can be started and the problem corrected. CAUSES   Hemorrhoids. These are enlarged (dilated) blood vessels or veins in the anal or rectal area.  Fistulas. Theseare abnormal, burrowing channels that usually run from inside the rectum to the skin around the anus. They can bleed.  Anal fissures. This is a tear in the tissue of the anus. Bleeding occurs with bowel movements.  Diverticulosis. This is a condition in which pockets or sacs project from the bowel wall. Occasionally, the sacs can bleed.  Diverticulitis. Thisis an infection involving diverticulosis of the colon.  Proctitis and colitis. These are conditions in which the rectum, colon, or both, can become inflamed and pitted (ulcerated).  Polyps and cancer. Polyps are non-cancerous (benign) growths in the colon that may bleed. Certain types of polyps turn into cancer.  Protrusion of the rectum. Part of the rectum can project from the anus and bleed.  Certain medicines.  Intestinal infections.  Blood vessel abnormalities. HOME CARE INSTRUCTIONS  Eat a high-fiber diet to keep your stool soft.  Limit activity.  Drink enough fluids to keep your urine clear or pale yellow.  Warm baths may be useful to soothe rectal pain.  Follow up with your caregiver as directed. SEEK IMMEDIATE MEDICAL CARE IF:  You develop increased bleeding.  You have black or dark red stools.  You vomit blood or material  that looks like coffee grounds.  You have abdominal pain or tenderness.  You have a fever.  You feel weak, nauseous, or you faint.  You have severe rectal pain or you are unable to have a bowel movement. MAKE SURE YOU:  Understand these instructions.  Will watch your condition.  Will get help right away if you are not doing well or get worse. Document Released: 09/18/2001 Document Revised: 06/21/2011 Document Reviewed: 09/13/2010 [facility] Patient Information 2013 [place], [place]. Constipation, Adult Constipation is when a person has fewer than 3 bowel movements a week; has difficulty having a bowel movement; or has stools that are dry, hard, or larger than normal. As people grow older, constipation is more common. If you try to fix constipation with medicines that make you have a bowel movement (laxatives), the problem may get worse. Long-term laxative use may cause the muscles of the colon to become weak. A low-fiber diet, not taking in enough fluids, and taking certain medicines may make constipation worse. CAUSES   Certain medicines, such as antidepressants, pain medicine, iron supplements, antacids, and water pills.   Certain diseases, such as diabetes, irritable bowel syndrome (IBS), thyroid disease, or depression.   Not drinking enough water.   Not eating enough fiber-rich foods.   Stress or travel.  Lack of physical activity or exercise.  Not going to the restroom when there is the urge to have a bowel movement.  Ignoring the urge to have a bowel movement.  Using laxatives too much. SYMPTOMS   Having fewer than 3 bowel movements a week.   Straining  to have a bowel movement.   Having hard, dry, or larger than normal stools.   Feeling full or bloated.   Pain in the lower abdomen.  Not feeling relief after having a bowel movement. DIAGNOSIS  Your caregiver will take a medical history and perform a physical exam. Further testing may be done for severe  constipation. Some tests may include:   A barium enema X-ray to examine your rectum, colon, and sometimes, your small intestine.  A sigmoidoscopy to examine your lower colon.  A colonoscopy to examine your entire colon. TREATMENT  Treatment will depend on the severity of your constipation and what is causing it. Some dietary treatments include drinking more fluids and eating more fiber-rich foods. Lifestyle treatments may include regular exercise. If these diet and lifestyle recommendations do not help, your caregiver may recommend taking over-the-counter laxative medicines to help you have bowel movements. Prescription medicines may be prescribed if over-the-counter medicines do not work.  HOME CARE INSTRUCTIONS   Increase dietary fiber in your diet, such as fruits, vegetables, whole grains, and beans. Limit high-fat and processed sugars in your diet, such as [place] fries, hamburgers, cookies, candies, and soda.   A fiber supplement may be added to your diet if you cannot get enough fiber from foods.   Drink enough fluids to keep your urine clear or pale yellow.   Exercise regularly or as directed by your caregiver.   Go to the restroom when you have the urge to go. Do not hold it.  Only take medicines as directed by your caregiver. Do not take other medicines for constipation without talking to your caregiver first. SEEK IMMEDIATE MEDICAL CARE IF:   You have bright red blood in your stool.   Your constipation lasts for more than 4 days or gets worse.   You have abdominal or rectal pain.   You have thin, pencil-like stools.  You have unexplained weight loss. MAKE SURE YOU:   Understand these instructions.  Will watch your condition.  Will get help right away if you are not doing well or get worse. Document Released: 12/26/2003 Document Revised: 06/21/2011 Document Reviewed: 03/02/2011 [facility] Patient Information 2013 [place], [place].

## 2012-01-28 NOTE — Assessment & Plan Note (Addendum)
Trial Miralax 17 grams daily for constipation.  Hold if diarrhea.  Follow up in 6 weeks

## 2012-01-28 NOTE — Progress Notes (Signed)
Faxed to PCP

## 2012-01-28 NOTE — Assessment & Plan Note (Addendum)
Jo Hale is a pleasant 51 y.o. female with 2 month history or rectal bleeding & constipation.  Suspect internal hemorrhoids or fissure given painful rectal exam & constipation.  Cannot r/o NSAID-induced colopathy.  I have a low-threshold for repeating colonoscopy since right-sided lesions may have been missed on colonoscopy, however pt declined colonoscopy at this time & requests symptomatic treatment only.  She understands the risks & is advised if bleeding persists, she should call us back.  Limit use of ibuprofen & anti-inflammatories. Anusol HC suppositories twice daily Rectal bleeding literature

## 2012-01-28 NOTE — Progress Notes (Signed)
Primary Care Physician:  Harlow Asa, MD Primary Gastroenterologist:  Dr. Jonette Eva  Chief Complaint  Patient presents with  . Constipation  . Rectal Bleeding    HPI:  Jo Hale is a 51 y.o. female here for evaluation of constipation & rectal bleeding.  She has been having a BM q2-3 days.  She has noticed bright red blood in her stool.  C/o small to large amounts over the past 2 months.  No clots.  She has not been eating a lot of fiber.  She has not tried anything for constipation.  She has not been getting much exercise.  She does not drink a lot of water.  Hx GERD & takes zantac prn almost daily.  This keeps her symptoms controlled.  Denies abdominal pain.  She has been taking Ibuprofen 600mg  before sleeping for shoulder & arm pain prn usually a couple days per week.  Her weight has been stable.  Denies fever or chills.  Prior to last colonoscopy in Nov 2012, pt ate steak & potatoes & had an incomplete exam in the ascending colon.  Recommended follow-up was in 5 years.  Past Medical History  Diagnosis Date  . GERD (gastroesophageal reflux disease)   . Hypertension   . Fibroids, intramural     450-600 gm uterus    Past Surgical History  Procedure Date  . Cesarean section   . Tubal ligation   . Colonoscopy 02/19/2011    Fields-incomplete exam, otherwise normal   . Laparoscopic supracervical hysterectomy 03/02/2011    Procedure: LAPAROSCOPIC SUPRACERVICAL HYSTERECTOMY;  Surgeon: Tilda Burrow, MD;  Location: [facility];  Service: Gynecology;  Laterality: N/A;  . Partial hysterectomy   . Rotator cuff repair     left    Current Outpatient Prescriptions  Medication Sig Dispense Refill  . ibuprofen (ADVIL,MOTRIN) 600 MG tablet Take 1 tablet by mouth Once daily as needed.      . ranitidine (ZANTAC) 300 MG capsule Take 300 mg by mouth every morning.        . triamterene-hydrochlorothiazide (DYAZIDE) 37.5-25 MG per capsule Take 1 capsule by mouth every evening.       .  hydrocortisone (ANUSOL-HC) 25 MG suppository Place 1 suppository (25 mg total) rectally every 12 (twelve) hours.  20 suppository  0  . polyethylene glycol powder (MIRALAX) powder Take 17 g by mouth daily.  527 g  11    Allergies as of 01/28/2012 - Review Complete 01/28/2012  Allergen Reaction Noted  . Adhesive (tape) Rash 02/22/2011    Family History  Problem Relation Age of Onset  . Colon cancer Neg Hx     History   Social History  . Marital Status: Married    Spouse Name: N/A    Number of Children: 2  . Years of Education: N/A   Occupational History  . 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 Smoking status: Never Smoker   . Smokeless tobacco: Not on file  . Alcohol Use: No  . Drug Use: No  . Sexually Active: Not on file  Review of Systems: Gen: Denies any fever, chills, sweats, anorexia, fatigue, weakness, malaise, weight loss, and sleep disorder CV: Denies chest pain, angina, palpitations, syncope, orthopnea, PND, peripheral edema, and claudication. Resp: Denies dyspnea at rest, dyspnea with exercise, cough, sputum, wheezing, coughing up blood, and pleurisy. GI: Denies vomiting blood, jaundice, and fecal incontinence.   Denies dysphagia or odynophagia. GU : Denies urinary burning, blood in  urine, urinary frequency, urinary hesitancy, nocturnal urination, and urinary incontinence. MS: Denies joint pain, limitation of movement, and swelling, stiffness, low back pain, extremity pain. Denies muscle weakness, cramps, atrophy.  Derm: Denies rash, itching, dry skin, hives, moles, warts, or unhealing ulcers.  Psych: Denies depression, anxiety, memory loss, suicidal ideation, hallucinations, paranoia, and confusion. Heme: Denies bruising or enlarged lymph nodes. Neuro:  Denies any headaches, dizziness, paresthesias. Endo:  Denies any problems with DM, thyroid, adrenal function.  Physical Exam: BP 126/73  Pulse 81  Temp 98.5 F (36.9 C) (Temporal)  Ht 5\' 2"   (1.575 m)  Wt 225 lb (102.059 kg)  BMI 41.15 kg/m2  LMP 02/17/2011 Patient's last menstrual period was 02/17/2011. General:   Alert,  Well-developed, well-nourished, pleasant and cooperative in NAD Head:  Normocephalic and atraumatic. Eyes:  Sclera clear, no icterus.   Conjunctiva pink. Ears:  Normal auditory acuity. Nose:  No deformity, discharge, or lesions. Mouth:  No deformity or lesions,oropharynx pink & moist. Neck:  Supple; no masses or thyromegaly. Lungs:  Clear throughout to auscultation.   No wheezes, crackles, or rhonchi. No acute distress. Heart:  Regular rate and rhythm; no murmurs, clicks, rubs,  or gallops. Abdomen:  Normal bowel sounds.  No bruits.  Soft, non-tender and non-distended without masses, hepatosplenomegaly or hernias noted.  No guarding or rebound tenderness.   Rectal:  No external lesions visualized. +painful tender rectal exam.  No internal masses palpated.  No gross blood. Msk:  Symmetrical without gross deformities. Normal posture. Pulses:  Normal pulses noted. Extremities:  No clubbing or edema. Neurologic:  Alert and  oriented x4;  grossly normal neurologically. Skin:  Intact without significant lesions or rashes. Lymph Nodes:  No significant cervical adenopathy. Psych:  Alert and cooperative. Normal mood and affect.

## 2012-02-23 NOTE — Progress Notes (Signed)
REVIEWED.  

## 2012-02-25 ENCOUNTER — Inpatient Hospital Stay (HOSPITAL_COMMUNITY): Admission: RE | Admit: 2012-02-25 | Payer: BC Managed Care – PPO | Source: Ambulatory Visit

## 2012-03-14 ENCOUNTER — Ambulatory Visit (HOSPITAL_COMMUNITY)
Admission: RE | Admit: 2012-03-14 | Discharge: 2012-03-14 | Disposition: A | Discharge status: Home / Self Care | Payer: BC Managed Care – PPO | Source: Ambulatory Visit | Attending: Family Medicine | Admitting: Family Medicine

## 2012-03-14 DIAGNOSIS — Z139 Encounter for screening, unspecified: Secondary | ICD-10-CM

## 2012-03-14 DIAGNOSIS — Z1231 Encounter for screening mammogram for malignant neoplasm of breast: Secondary | ICD-10-CM | POA: Insufficient documentation

## 2012-07-07 ENCOUNTER — Other Ambulatory Visit: Payer: Self-pay | Admitting: *Deleted

## 2012-07-21 ENCOUNTER — Other Ambulatory Visit: Payer: Self-pay | Admitting: Family Medicine

## 2012-07-25 ENCOUNTER — Encounter: Payer: Self-pay | Admitting: *Deleted

## 2012-07-26 ENCOUNTER — Encounter: Payer: Self-pay | Admitting: Obstetrics and Gynecology

## 2012-07-26 ENCOUNTER — Other Ambulatory Visit (HOSPITAL_COMMUNITY)
Admission: RE | Admit: 2012-07-26 | Discharge: 2012-07-26 | Disposition: A | Discharge status: Home / Self Care | Payer: BC Managed Care – PPO | Source: Ambulatory Visit | Attending: Obstetrics and Gynecology | Admitting: Obstetrics and Gynecology

## 2012-07-26 ENCOUNTER — Ambulatory Visit (INDEPENDENT_AMBULATORY_CARE_PROVIDER_SITE_OTHER): Payer: BC Managed Care – PPO | Admitting: Obstetrics and Gynecology

## 2012-07-26 VITALS — BP 150/90 | Ht 63.0 in | Wt 220.2 lb

## 2012-07-26 DIAGNOSIS — Z1151 Encounter for screening for human papillomavirus (HPV): Secondary | ICD-10-CM | POA: Insufficient documentation

## 2012-07-26 DIAGNOSIS — Z01419 Encounter for gynecological examination (general) (routine) without abnormal findings: Secondary | ICD-10-CM | POA: Insufficient documentation

## 2012-07-26 DIAGNOSIS — R319 Hematuria, unspecified: Secondary | ICD-10-CM

## 2012-07-26 DIAGNOSIS — Z0000 Encounter for general adult medical examination without abnormal findings: Secondary | ICD-10-CM

## 2012-07-26 DIAGNOSIS — Z1212 Encounter for screening for malignant neoplasm of rectum: Secondary | ICD-10-CM

## 2012-07-26 LAB — CBC
HCT: 37.1 % (ref 36.0–46.0)
MCHC: 32.9 g/dL (ref 30.0–36.0)
MCV: 94.2 fL (ref 78.0–100.0)
RDW: 13.8 % (ref 11.5–15.5)

## 2012-07-26 LAB — POCT URINALYSIS DIPSTICK
Ketones, UA: NEGATIVE
Leukocytes, UA: NEGATIVE

## 2012-07-26 LAB — HEMOCCULT GUIAC POC 1CARD (OFFICE): Fecal Occult Blood, POC: NEGATIVE

## 2012-07-26 NOTE — Patient Instructions (Signed)
  We will need to reevaluate the pain on the left if it worsens. For now , I do not find any gyn problems to explain it.   Your BMI is 39.  Please consider a 10% weight loss as a goal... You'll feel the difference, and you'll truly be healthier and live longer.

## 2012-07-26 NOTE — Addendum Note (Signed)
Addended by: Malachy Mood S on: 07/26/2012 11:01 AM   Modules accepted: Orders

## 2012-07-26 NOTE — Progress Notes (Signed)
  Assessment:  Normal Gyn Exam s/p supracervical hyst 2012 Nonspecific LLQ discomfort, no gyn etiology found   Plan:  1. Mammogram q yr 2. pap smear done, next pap due 2017 3. return annually or prn  Subjective:  Jo Hale is a 52 y.o. female [id number] who presents for annual exam.  The patient has complaints today of intermittent pain in llq, infrequent, 1-2/wk, not assoc'd c bm,or voiding. incr by sitting. Occurs WHILE sitting. Not noted on sexual activity  The following portions of the patient's history were reviewed and updated as appropriate: allergies, current medications, past family history, past medical history, past social history, past surgical history and problem list.  Review of Systems Pertinent items are noted in HPI.  Objective:  BP 150/90  Ht 5\' 3"  (1.6 m)  Wt 220 lb 3.2 oz (99.882 kg)  BMI 39.02 kg/m2  LMP 02/17/2011  BMI: Body mass index is 39.02 kg/(m^2). General Appearance: Alert, appropriate appearance for age. No acute distress HEENT: Grossly normal Neck / Thyroid:  Cardiovascular: RRR; normal S1, S2, no murmur Lungs: CTA bilaterally Back: No CVAT Breast Exam: No dimpling, nipple retraction or discharge. No masses or nodes. and No masses or nodes.No dimpling, nipple retraction or discharge. Gastrointestinal: Soft, non-tender, no masses or organomegaly Pelvic Exam: External genitalia: normal general appearance Vaginal: normal mucosa without prolapse or lesions Cervix: normal appearance Adnexa: normal bimanual exam Uterus: absent Exam limited by body habitus Rectovaginal: normal rectal, no masses Lymphatic Exam: Non-palpable nodes in neck, clavicular, axillary, or inguinal regions Skin: no rash or abnormalities Neurologic: Normal gait and speech, no tremor  Psychiatric: Alert and oriented, appropriate affect.  Urinalysis:microscopic hematuria  Christin Bach. MD Pgr [phone] 10:19 AM

## 2012-07-26 NOTE — Addendum Note (Signed)
Addended by: Richardson Chiquito on: 07/26/2012 10:50 AM   Modules accepted: Orders

## 2012-07-27 LAB — URINALYSIS
Glucose, UA: NEGATIVE mg/dL
Hgb urine dipstick: NEGATIVE
Ketones, ur: NEGATIVE mg/dL
Leukocytes, UA: NEGATIVE
Protein, ur: NEGATIVE mg/dL

## 2012-07-27 LAB — COMPREHENSIVE METABOLIC PANEL
AST: 19 U/L (ref 0–37)
Alkaline Phosphatase: 68 U/L (ref 39–117)
BUN: 13 mg/dL (ref 6–23)
Calcium: 9.3 mg/dL (ref 8.4–10.5)
Chloride: 104 mEq/L (ref 96–112)
Creat: 1.12 mg/dL — ABNORMAL HIGH (ref 0.50–1.10)

## 2012-07-27 LAB — LIPID PANEL
HDL: 62 mg/dL (ref >39)
Total CHOL/HDL Ratio: 2.9 Ratio
Triglycerides: 65 mg/dL (ref <150)

## 2012-08-11 ENCOUNTER — Telehealth: Payer: Self-pay | Admitting: Obstetrics and Gynecology

## 2012-08-11 NOTE — Telephone Encounter (Signed)
Left message. JSY 

## 2012-08-14 NOTE — Telephone Encounter (Signed)
Please review labs. Cholesterol was a little elevated and creatinine was a little abnormal. Please advise. JSY

## 2012-08-15 NOTE — Telephone Encounter (Signed)
Pt informed of LDL results of 103 and Creatinine lever of 1.12, pt encouraged to decrease fat intake and exercise to maintain good cholesterol levels. All other labs normal on 07/26/2012

## 2012-11-04 ENCOUNTER — Other Ambulatory Visit: Payer: Self-pay | Admitting: Family Medicine

## 2012-11-07 ENCOUNTER — Telehealth: Payer: Self-pay | Admitting: Family Medicine

## 2012-11-07 NOTE — Telephone Encounter (Signed)
Med called into [facility] and pt notified on her voicemail

## 2012-11-07 NOTE — Telephone Encounter (Signed)
Pt would like to know if she can get you to fill her cream that Dr Margo Aye (dermatology) had given her? She does not wish to go back to Dr Margo Aye due to she thinks he is not really helping her, but this cream helps keep her irritation on her face calmed down.   Locoid Lipocream 0.1% apply as needed  Send to [facility]

## 2012-11-07 NOTE — Telephone Encounter (Signed)
Ok ref times three 

## 2012-12-27 ENCOUNTER — Other Ambulatory Visit: Payer: Self-pay | Admitting: Family Medicine

## 2013-01-08 ENCOUNTER — Encounter: Payer: Self-pay | Admitting: Family Medicine

## 2013-01-08 ENCOUNTER — Ambulatory Visit (HOSPITAL_COMMUNITY)
Admission: RE | Admit: 2013-01-08 | Discharge: 2013-01-08 | Disposition: A | Discharge status: Home / Self Care | Payer: BC Managed Care – PPO | Source: Ambulatory Visit | Attending: Family Medicine | Admitting: Family Medicine

## 2013-01-08 ENCOUNTER — Ambulatory Visit (INDEPENDENT_AMBULATORY_CARE_PROVIDER_SITE_OTHER): Payer: BC Managed Care – PPO | Admitting: Family Medicine

## 2013-01-08 VITALS — BP 134/88 | Temp 98.2°F | Ht 61.0 in | Wt 223.0 lb

## 2013-01-08 DIAGNOSIS — R059 Cough, unspecified: Secondary | ICD-10-CM

## 2013-01-08 DIAGNOSIS — R079 Chest pain, unspecified: Secondary | ICD-10-CM | POA: Insufficient documentation

## 2013-01-08 DIAGNOSIS — I10 Essential (primary) hypertension: Secondary | ICD-10-CM

## 2013-01-08 DIAGNOSIS — R05 Cough: Secondary | ICD-10-CM | POA: Insufficient documentation

## 2013-01-08 DIAGNOSIS — K219 Gastro-esophageal reflux disease without esophagitis: Secondary | ICD-10-CM

## 2013-01-08 DIAGNOSIS — Z79899 Other long term (current) drug therapy: Secondary | ICD-10-CM

## 2013-01-08 DIAGNOSIS — R109 Unspecified abdominal pain: Secondary | ICD-10-CM

## 2013-01-08 LAB — POCT URINALYSIS DIPSTICK: pH, UA: 7

## 2013-01-08 MED ORDER — BENZONATATE 100 MG PO CAPS: 100.0000 mg | ORAL_CAPSULE | Freq: Four times a day (QID) | ORAL | Status: DC | PRN

## 2013-01-08 MED ORDER — AMOXICILLIN 500 MG PO TABS: 500.0000 mg | ORAL_TABLET | Freq: Three times a day (TID) | ORAL | Status: DC

## 2013-01-08 MED ORDER — DICLOFENAC SODIUM 75 MG PO TBEC: 75.0000 mg | DELAYED_RELEASE_TABLET | Freq: Two times a day (BID) | ORAL | Status: DC | PRN

## 2013-01-08 NOTE — Progress Notes (Signed)
  Subjective:    Patient ID: Jo Hale, female    DOB: 21-Feb-1961, 52 y.o.   MRN: [id number]  Cough This is a new problem. The current episode started in the past 7 days. Associated symptoms comments: Throat pain.   Abdominal pain. Pain on right side going on for awhile. Going on for months. Pressing into it seems to ease it up.  Not affected by meals Coughing bad at night, dry at times  Patient medication for blood pressure. She takes it faithfully. No obvious side effects. Trying to watch salt intake. Walking a bit.  Patient uses ranitidine 300 mg daily for reflux. Generally controls her symptoms. Also trying to cut down caffeine intake.  Cough has been particularly rough at nighttime lately.  Review of Systems  Respiratory: Positive for cough.    no headache no abdominal pain no change in bowel habits no blood in stool     Objective:   Physical Exam  Alert no apparent distress. Blood pressure good on repeat. HEENT normal. Lungs clear heart regular in rhythm. Some lateral chest wall tenderness on the right side to deep palpation. Abdominal exam benign.      Assessment & Plan:  An in and in an impression #1 hypertension good control. #2 reflux also apparent good control. #3 bronchitis with secondary costochondritis. Likely diagnosis. Discussed at length. Plan trial of anti-inflammatory. Cough medicine. X-ray. And blood work. Followup as scheduled. WSL

## 2013-01-09 LAB — HEPATIC FUNCTION PANEL
Albumin: 3.6 g/dL (ref 3.5–5.2)
Alkaline Phosphatase: 56 U/L (ref 39–117)
Indirect Bilirubin: 0.5 mg/dL (ref 0.0–0.9)
Total Bilirubin: 0.6 mg/dL (ref 0.3–1.2)

## 2013-01-09 LAB — LIPID PANEL
HDL: 48 mg/dL (ref >39)
LDL Cholesterol: 79 mg/dL (ref 0–99)

## 2013-01-09 LAB — BASIC METABOLIC PANEL
CO2: 26 mEq/L (ref 19–32)
Chloride: 103 mEq/L (ref 96–112)
Creat: 1.07 mg/dL (ref 0.50–1.10)

## 2013-01-16 ENCOUNTER — Ambulatory Visit: Payer: BC Managed Care – PPO | Admitting: Family Medicine

## 2013-01-18 ENCOUNTER — Telehealth: Payer: Self-pay | Admitting: Family Medicine

## 2013-01-18 MED ORDER — HYDROCODONE-HOMATROPINE 5-1.5 MG/5ML PO SYRP: 5.0000 mL | ORAL_SOLUTION | Freq: Every evening | ORAL | Status: DC | PRN

## 2013-01-18 NOTE — Telephone Encounter (Signed)
Does pt realize she missed f u appt yesterday? Hycodan 3 oz one tspn qhs prn cough. Re sched next wk to cover multiple issues

## 2013-01-18 NOTE — Telephone Encounter (Signed)
Discussed with pt. Script ready for pickup.  

## 2013-01-18 NOTE — Telephone Encounter (Signed)
Cough pills are not working at all that patient was prescribed. She still has dry cough. When she breathes in through her nose its a tickle and its "strangling" her she says. She would like something else called in to [facility]

## 2013-01-18 NOTE — Telephone Encounter (Signed)
Left message on voicemail to return call.

## 2013-01-25 DIAGNOSIS — I10 Essential (primary) hypertension: Secondary | ICD-10-CM | POA: Insufficient documentation

## 2013-01-25 DIAGNOSIS — K219 Gastro-esophageal reflux disease without esophagitis: Secondary | ICD-10-CM | POA: Insufficient documentation

## 2013-01-26 ENCOUNTER — Ambulatory Visit: Payer: BC Managed Care – PPO | Admitting: Family Medicine

## 2013-02-05 ENCOUNTER — Encounter: Payer: Self-pay | Admitting: Family Medicine

## 2013-02-05 ENCOUNTER — Ambulatory Visit (INDEPENDENT_AMBULATORY_CARE_PROVIDER_SITE_OTHER): Payer: BC Managed Care – PPO | Admitting: Family Medicine

## 2013-02-05 VITALS — BP 134/80 | Temp 98.0°F | Ht 62.0 in | Wt 221.0 lb

## 2013-02-05 DIAGNOSIS — J209 Acute bronchitis, unspecified: Secondary | ICD-10-CM

## 2013-02-05 MED ORDER — CLARITHROMYCIN 500 MG PO TABS: 500.0000 mg | ORAL_TABLET | Freq: Two times a day (BID) | ORAL | Status: AC

## 2013-02-05 NOTE — Progress Notes (Signed)
  Subjective:    Patient ID: MAELEY MATTON, female    DOB: 1960-11-18, 52 y.o.   MRN: [id number]  HPIHere to follow up on blodwork and chest x ray.   Results for orders placed in visit on 01/13/13  HM MAMMOGRAPHY      Result Value Range   HM Mammogram [facility]    HM COLONOSCOPY      Result Value Range   HM Colonoscopy [facility]       Here a few weeks ago for cough. Finished cough meds and antibiotic still having cough.   Cough still productive of yellowish phlegm at times. No obvious fever. Chest pain has improved.  Review of Systems No nausea no vomiting ROS otherwise negative    Objective:   Physical Exam   Alert HEENT mom his congestion pharynx normal lungs clear heart regular in rhythm     Assessment & Plan:  Impression subacute bronchitis discussed plan Biaxin 500 twice a day 10 days. Symptomatic care discussed. Hycodan 1 teaspoon each bedtime when necessary. WSL 4 ounces

## 2013-02-12 ENCOUNTER — Other Ambulatory Visit: Payer: Self-pay | Admitting: Family Medicine

## 2013-03-26 ENCOUNTER — Telehealth: Payer: Self-pay | Admitting: Family Medicine

## 2013-03-26 MED ORDER — BENZONATATE 100 MG PO CAPS: 100.0000 mg | ORAL_CAPSULE | Freq: Every evening | ORAL | Status: DC | PRN

## 2013-03-26 NOTE — Telephone Encounter (Signed)
Having a dry cough at night. No wheezing, no fever. Taking benadryl.

## 2013-03-26 NOTE — Telephone Encounter (Signed)
Discussed with patient. Med sent to pharm.  

## 2013-03-26 NOTE — Telephone Encounter (Signed)
Tess perle m100 mg 30 one qhs prn cough

## 2013-03-26 NOTE — Telephone Encounter (Signed)
Pt has a cough that she is having more trouble with at night can we call her in something to [facility]?

## 2013-04-03 ENCOUNTER — Other Ambulatory Visit: Payer: Self-pay | Admitting: Family Medicine

## 2013-04-17 ENCOUNTER — Institutional Professional Consult (permissible substitution): Payer: BC Managed Care – PPO

## 2013-07-31 ENCOUNTER — Ambulatory Visit (INDEPENDENT_AMBULATORY_CARE_PROVIDER_SITE_OTHER): Payer: BC Managed Care – PPO | Admitting: Adult Health

## 2013-07-31 ENCOUNTER — Encounter: Payer: Self-pay | Admitting: Adult Health

## 2013-07-31 VITALS — BP 130/80 | HR 76 | Ht 62.0 in | Wt 217.0 lb

## 2013-07-31 DIAGNOSIS — Z01419 Encounter for gynecological examination (general) (routine) without abnormal findings: Secondary | ICD-10-CM

## 2013-07-31 DIAGNOSIS — Z1212 Encounter for screening for malignant neoplasm of rectum: Secondary | ICD-10-CM

## 2013-07-31 DIAGNOSIS — M25551 Pain in right hip: Secondary | ICD-10-CM | POA: Insufficient documentation

## 2013-07-31 HISTORY — DX: Pain in right hip: M25.551

## 2013-07-31 LAB — HEMOCCULT GUIAC POC 1CARD (OFFICE): FECAL OCCULT BLD: NEGATIVE

## 2013-07-31 NOTE — Progress Notes (Signed)
Patient ID: Jo Hale, female   DOB: 02-Jan-1961, 53 y.o.   MRN: [id number] History of Present Illness: Jo Hale is a 53 year old black female in for a physical.She had a normal pap with negative HPV 07/26/12.   Current Medications, Allergies, Past Medical History, Past Surgical History, Family History and Social History were reviewed in [employer] record.     Review of Systems: Patient denies any headaches, blurred vision, shortness of breath, chest pain, abdominal pain, problems with bowel movements, urination, or intercourse. Has pain in right hip, it radiates some, no mood swings.    Physical Exam:BP 130/80  Pulse 76  Ht 5\' 2"  (1.575 m)  Wt 217 lb (98.431 kg)  BMI 39.68 kg/m2  LMP 02/17/2011 General:  Well developed, well nourished, no acute distress Skin:  Warm and dry Neck:  Midline trachea, normal thyroid Lungs; Clear to auscultation bilaterally Breast:  No dominant palpable mass, retraction, or nipple discharge Cardiovascular: Regular rate and rhythm Abdomen:  Soft, non tender, no hepatosplenomegaly Pelvic:  External genitalia is normal in appearance.  The vagina is normal in appearance. The cervix is bulbous.  Uterus is absent. No adnexal masses or tenderness noted. Rectal: Good sphincter tone, no polyps,small hemorrhoid felt.  Hemoccult negative. 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   Impression: Yearly gyn exam no pap Right posterior hip pain    Plan: Physical in 1 year Mammogram yearly Colonoscopy 2022 Try alternating ice and heat and try aleve if no better see Dr Lovena Le or Dr Aline Brochure Check CBC,CMP and TSH

## 2013-07-31 NOTE — Patient Instructions (Addendum)
Physical in 1 year Mammogram yearly Call with labsHip Pain The hips join the upper legs to the lower pelvis. The bones, cartilage, tendons, and muscles of the hip joint perform a lot of work each day holding your body weight and allowing you to move around. Hip pain is a common symptom. It can range from a minor ache to severe pain on 1 or both hips. Pain may be felt on the inside of the hip joint near the groin, or the outside near the buttocks and upper thigh. There may be swelling or stiffness as well. It occurs more often when a person walks or performs activity. There are many reasons hip pain can develop. CAUSES  It is important to work with your caregiver to identify the cause since many conditions can impact the bones, cartilage, muscles, and tendons of the hips. Causes for hip pain include:  Broken (fractured) bones.  Separation of the thighbone from the hip socket (dislocation).  Torn cartilage of the hip joint.  Swelling (inflammation) of a tendon (tendonitis), the sac within the hip joint (bursitis), or a joint.  A weakening in the abdominal wall (hernia), affecting the nerves to the hip.  Arthritis in the hip joint or lining of the hip joint.  Pinched nerves in the back, hip, or upper thigh.  A bulging disc in the spine (herniated disc).  Rarely, bone infection or cancer. DIAGNOSIS  The location of your hip pain will help your caregiver understand what may be causing the pain. A diagnosis is based on your medical history, your symptoms, results from your physical exam, and results from diagnostic tests. Diagnostic tests may include X-ray exams, a computerized magnetic scan (magnetic resonance imaging, MRI), or bone scan. TREATMENT  Treatment will depend on the cause of your hip pain. Treatment may include:  Limiting activities and resting until symptoms improve.  Crutches or other walking supports (a cane or brace).  Ice, elevation, and compression.  Physical therapy  or home exercises.  Shoe inserts or special shoes.  Losing weight.  Medications to reduce pain.  Undergoing surgery. HOME CARE INSTRUCTIONS   Only take over-the-counter or prescription medicines for pain, discomfort, or fever as directed by your caregiver.  Put ice on the injured area:  Put ice in a plastic bag.  Place a towel between your skin and the bag.  Leave the ice on for 15-20 minutes at a time, 03-04 times a day.  Keep your leg raised (elevated) when possible to lessen swelling.  Avoid activities that cause pain.  Follow specific exercises as directed by your caregiver.  Sleep with a pillow between your legs on your most comfortable side.  Record how often you have hip pain, the location of the pain, and what it feels like. This information may be helpful to you and your caregiver.  Ask your caregiver about returning to work or sports and whether you should drive.  Follow up with your caregiver for further exams, therapy, or testing as directed. SEEK MEDICAL CARE IF:   Your pain or swelling continues or worsens after 1 week.  You are feeling unwell or have chills.  You have increasing difficulty with walking.  You have a loss of sensation or other new symptoms.  You have questions or concerns. SEEK IMMEDIATE MEDICAL CARE IF:   You cannot put weight on the affected hip.  You have fallen.  You have a sudden increase in pain and swelling in your hip.  You have a fever. MAKE  SURE YOU:   Understand these instructions.  Will watch your condition.  Will get help right away if you are not doing well or get worse. Document Released: 09/16/2009 Document Revised: 06/21/2011 Document Reviewed: 09/16/2009 [facility] Patient Information 2014 [place].

## 2013-08-01 LAB — CBC
HCT: 37.7 % (ref 36.0–46.0)
HEMOGLOBIN: 12.6 g/dL (ref 12.0–15.0)
MCH: 30.7 pg (ref 26.0–34.0)
MCHC: 33.4 g/dL (ref 30.0–36.0)
MCV: 92 fL (ref 78.0–100.0)
Platelets: 276 10*3/uL (ref 150–400)
RBC: 4.1 MIL/uL (ref 3.87–5.11)
RDW: 13.7 % (ref 11.5–15.5)
WBC: 4.6 10*3/uL (ref 4.0–10.5)

## 2013-08-01 LAB — COMPREHENSIVE METABOLIC PANEL
ALK PHOS: 72 U/L (ref 39–117)
ALT: 24 U/L (ref 0–35)
AST: 17 U/L (ref 0–37)
Albumin: 3.8 g/dL (ref 3.5–5.2)
BUN: 17 mg/dL (ref 6–23)
CO2: 27 mEq/L (ref 19–32)
Calcium: 9 mg/dL (ref 8.4–10.5)
Chloride: 105 mEq/L (ref 96–112)
Creat: 1 mg/dL (ref 0.50–1.10)
Glucose, Bld: 80 mg/dL (ref 70–99)
Potassium: 3.6 mEq/L (ref 3.5–5.3)
SODIUM: 139 meq/L (ref 135–145)
TOTAL PROTEIN: 6.6 g/dL (ref 6.0–8.3)
Total Bilirubin: 0.4 mg/dL (ref 0.2–1.2)

## 2013-08-01 LAB — TSH: TSH: 2.39 u[IU]/mL (ref 0.350–4.500)

## 2013-08-03 ENCOUNTER — Telehealth: Payer: Self-pay | Admitting: Adult Health

## 2013-08-03 NOTE — Telephone Encounter (Signed)
Left message labs normal

## 2013-08-20 ENCOUNTER — Telehealth: Payer: Self-pay | Admitting: *Deleted

## 2013-08-20 NOTE — Telephone Encounter (Signed)
Left message to call me back.

## 2013-08-21 NOTE — Telephone Encounter (Signed)
Pt given number to Dr Aline Brochure for pain in leg

## 2013-08-28 ENCOUNTER — Telehealth: Payer: Self-pay | Admitting: *Deleted

## 2013-08-28 NOTE — Telephone Encounter (Signed)
Pt aware will send referral to Dr Aline Brochure for right hip pain

## 2013-08-28 NOTE — Telephone Encounter (Signed)
Pt states Dr. Ruthe Mannan office states needed a referral from our office for an appointment for hip pain.

## 2013-09-17 ENCOUNTER — Other Ambulatory Visit: Payer: Self-pay | Admitting: Family Medicine

## 2013-09-19 ENCOUNTER — Telehealth: Payer: Self-pay | Admitting: Family Medicine

## 2013-09-19 MED ORDER — RANITIDINE HCL 300 MG PO TABS: ORAL_TABLET | ORAL | Status: DC

## 2013-09-19 NOTE — Telephone Encounter (Signed)
Patient needs Rx for rantidine.   [facility]

## 2013-09-19 NOTE — Telephone Encounter (Signed)
Rx sent electronically to pharmacy. Patient notified. 

## 2013-10-09 ENCOUNTER — Ambulatory Visit (INDEPENDENT_AMBULATORY_CARE_PROVIDER_SITE_OTHER): Payer: BC Managed Care – PPO

## 2013-10-09 ENCOUNTER — Ambulatory Visit (INDEPENDENT_AMBULATORY_CARE_PROVIDER_SITE_OTHER): Payer: BC Managed Care – PPO | Admitting: Orthopedic Surgery

## 2013-10-09 VITALS — BP 142/75 | Ht 62.0 in | Wt 220.0 lb

## 2013-10-09 DIAGNOSIS — IMO0002 Reserved for concepts with insufficient information to code with codable children: Secondary | ICD-10-CM

## 2013-10-09 DIAGNOSIS — M5410 Radiculopathy, site unspecified: Secondary | ICD-10-CM

## 2013-10-09 DIAGNOSIS — M545 Low back pain, unspecified: Secondary | ICD-10-CM | POA: Insufficient documentation

## 2013-10-09 MED ORDER — GABAPENTIN 100 MG PO CAPS: 100.0000 mg | ORAL_CAPSULE | Freq: Three times a day (TID) | ORAL | Status: DC | PRN

## 2013-10-09 NOTE — Patient Instructions (Signed)
Call to arrange therapy 

## 2013-10-09 NOTE — Progress Notes (Signed)
Subjective:     Patient ID: Jo Hale, female   DOB: 06/13/1960, 53 y.o.   MRN: [id number]  Chief Complaint  Patient presents with  . Pain    Pain in buttocks running down bacl of right leg    HPI 53 years old presents with pain in her buttocks and pain running down the right side of her leg which is intermittent. The pain seems to be worse with standing and walking. No trauma. She denies catching or locking but did have a giving way symptoms of the right lower extremity  Review of systems there was some mild numbness and tingling mild gait disturbance was also noted under musculoskeletal. Otherwise review of systems is as recorded in the medical record. Past Medical History  Diagnosis Date  . GERD (gastroesophageal reflux disease)   . Hypertension   . Fibroids, intramural     450-600 gm uterus  . Abnormal pap   . Fibroids   . Posterior pain of right hip 07/31/2013     Review of Systems     Objective:   Physical Exam Vital signs: BP 142/75  Ht 5\' 2"  (1.575 m)  Wt 220 lb (99.791 kg)  BMI 40.23 kg/m2  LMP 02/17/2011   General the patient is well-developed and well-nourished grooming and hygiene are normal Oriented x3 Mood and affect normal Ambulation remains normal with no abnormal posturing  Lower extremities right and left Full range of motion All joints are stable Motor exam is normal Skin clean dry and intact  Cardiovascular exam is normal Sensory exam normal  Spine tenderness right hip otherwise normal. No increased muscle tension. Skin normal.       Assessment:     X-rays show no significant changes in the lumbar spine other than some contour changes which may be related to pain and reactive muscle spasm.  Impression back pain with intermittent sciatica right leg    Plan:     Physical therapy Neurontin 100 mg every 8 as needed  This is a nonsurgical issue at this point  Followup with [place] as needed

## 2013-10-30 ENCOUNTER — Ambulatory Visit (HOSPITAL_COMMUNITY): Payer: BC Managed Care – PPO | Admitting: Physical Therapy

## 2013-11-01 ENCOUNTER — Telehealth (HOSPITAL_COMMUNITY): Payer: Self-pay

## 2013-11-02 ENCOUNTER — Ambulatory Visit (HOSPITAL_COMMUNITY): Payer: BC Managed Care – PPO | Admitting: Physical Therapy

## 2013-11-07 ENCOUNTER — Ambulatory Visit (HOSPITAL_COMMUNITY)
Admission: RE | Admit: 2013-11-07 | Discharge: 2013-11-07 | Disposition: A | Discharge status: Home / Self Care | Payer: BC Managed Care – PPO | Source: Ambulatory Visit | Attending: Orthopedic Surgery | Admitting: Orthopedic Surgery

## 2013-11-07 DIAGNOSIS — IMO0001 Reserved for inherently not codable concepts without codable children: Secondary | ICD-10-CM | POA: Insufficient documentation

## 2013-11-07 DIAGNOSIS — I10 Essential (primary) hypertension: Secondary | ICD-10-CM | POA: Insufficient documentation

## 2013-11-07 DIAGNOSIS — M545 Low back pain, unspecified: Secondary | ICD-10-CM | POA: Insufficient documentation

## 2013-11-07 DIAGNOSIS — R29898 Other symptoms and signs involving the musculoskeletal system: Secondary | ICD-10-CM | POA: Insufficient documentation

## 2013-11-07 DIAGNOSIS — M5410 Radiculopathy, site unspecified: Secondary | ICD-10-CM | POA: Insufficient documentation

## 2013-11-07 NOTE — Evaluation (Signed)
Physical Therapy Evaluation  Patient Details  Name: Jo Hale MRN: [id number] Date of Birth: [birth year]  Today's Date: 11/07/2013 Time: 0805-0855 PT Time Calculation (min): 50 min Charge evaluation [phone]; traction [phone]             Visit#: 1 of 12  Re-eval: 12/07/13 Assessment Diagnosis: LBP  Authorization: BCBS    Past Medical History:  Past Medical History  Diagnosis Date  . GERD (gastroesophageal reflux disease)   . Hypertension   . Fibroids, intramural     450-600 gm uterus  . Abnormal pap   . Fibroids   . Posterior pain of right hip 07/31/2013   Past Surgical History:  Past Surgical History  Procedure Laterality Date  . Cesarean section    . Tubal ligation    . Colonoscopy  02/19/2011    Fields-incomplete exam, otherwise normal   . Laparoscopic supracervical hysterectomy  03/02/2011    Procedure: LAPAROSCOPIC SUPRACERVICAL HYSTERECTOMY;  Surgeon: Jonnie Kind, MD;  Location: [facility];  Service: Gynecology;  Laterality: N/A;  . Partial hysterectomy    . Rotator cuff repair      left    Subjective Symptoms/Limitations Symptoms: Ms. Stalnaker states that she has had back pain for the past two months that occasionally goes down to her ankle.  She denies any injury.  She feels that the pain is due to having to stand at work.  The back pain is greater on the right.  She has been given pain meds which seems to take the pain away temporarily.  She has been referred to therapy to decrease her symptoms.  How long can you sit comfortably?: no difficulty sitting How long can you stand comfortably?: Able to stand for ten minutes  How long can you walk comfortably?: Able to walk but has decreased pain.  Patient Stated Goals: Pt has difficulty getting comfortable prior to going to sleeep  Pain Assessment Currently in Pain?: Yes Pain Score: 8  Pain Location: Back Pain Orientation: Right Pain Radiating Towards: knee     Balance Screening Balance Screen Has the  patient fallen in the past 6 months: No  Prior Functioning  Prior Function Vocation: Full time employment Vocation Requirements: security     Sensation/Coordination/Flexibility/Functional Tests Functional Tests Functional Tests: 89  Assessment RLE Strength Right Hip Flexion: 5/5 Right Hip Extension: 3-/5 Right Hip ABduction:  (5-/5) Right Knee Flexion: 5/5 Right Knee Extension: 4/5 Right Ankle Dorsiflexion: 5/5 LLE Strength Left Hip Flexion: 5/5 Left Hip Extension: 3/5 Left Hip ABduction:  (5-/5) Left Knee Flexion: 5/5 Left Knee Extension: 4/5 Left Ankle Dorsiflexion: 5/5 Lumbar AROM Lumbar Flexion: wnl with increased pain  Lumbar Extension: wnl with reps improved Lumbar - Right Side Bend: wnl with improvement Lumbar - Left Side Bend: wnl with increased pain Lumbar - Right Rotation: decreased 20% Lumbar - Left Rotation: decreased 20%  Exercise/Treatments    Modalities Modalities: Traction Traction Type of Traction: Lumbar Min (lbs): static Max (lbs): 75 Hold Time: static Time: 15  Physical Therapy Assessment and Plan PT Assessment and Plan Clinical Impression Statement: Pt is a 53 yo female with complaints of radicular back pain.  Pt has no hx of injury or trauma.  She has been referred to PT to assist in alleviating her pain.  Exam demonstrated decrased core and LE mm strength, decreased ROM.  Pt will benefit from skilled PT to return pt to previous functioning level  Pt will benefit from skilled therapeutic intervention in order to improve on the  following deficits: Pain;Decreased strength;Decreased range of motion Rehab Potential: Good PT Frequency: Min 3X/week PT Duration: 4 weeks PT Treatment/Interventions: Therapeutic exercise;Modalities PT Plan: assess how traction did for her pain.  Begin stabilization exercises  ab set, bridge, bend knee raise. floating SLR(begin both legs bent slowly straighten Rt take up and down approximately 3-4 '); stretching  including Hamstrring, hip flexors, and low back     Goals Home Exercise Program Pt/caregiver will Perform Home Exercise Program: For increased ROM PT Goal: Perform Home Exercise Program - Progress: Goal set today PT Short Term Goals Time to Complete Short Term Goals: 2 weeks PT Short Term Goal 1: Pt pain to radiate to the knee only PT Short Term Goal 2: Pt to state she can walk for 2-3 hours without pain PT Short Term Goal 3: Pt to be able to sit for an hour without increased pain  PT Short Term Goal 4: Pain level to be no greater than a 5/10 80% of the day. PT Long Term Goals Time to Complete Long Term Goals: 4 weeks PT Long Term Goal 1: Pt to have no radicular sx PT Long Term Goal 2: Pt pain to be at the most a 3/10 80% of the day Long Term Goal 3: Pt to be I in advance HEP Long Term Goal 4: Pt to have no difficulty getting to sleep   Problem List Patient Active Problem List   Diagnosis Date Noted  . Radicular low back pain 11/07/2013  . Leg weakness, bilateral 11/07/2013  . Lumbago 10/09/2013  . Posterior pain of right hip 07/31/2013  . Esophageal reflux 01/25/2013  . Essential hypertension, benign 01/25/2013  . Rectal bleed 01/28/2012  . Constipation 01/28/2012  . Pain in joint, shoulder region 08/02/2011  . Muscle weakness (generalized) 08/02/2011  . Status post rotator cuff repair 08/02/2011  . FOOT PAIN 12/22/2009  . 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 POPLITEAL CYST, LEFT 06/30/2009  . MEDIAL MENISCUS TEAR, LEFT 06/30/2009    PT Plan of Care PT Home Exercise Plan: given   GP    RUSSELL,CINDY 11/07/2013, 10:11 AM  Physician Documentation Your signature is required to indicate approval of the treatment plan as stated above.  Please sign and either send electronically or make a copy of this report for your files and return this physician signed original.   Please mark one 1.__approve of plan  2. ___approve of plan with the following  conditions.   ______________________________                                                          _____________________ Physician Signature                                                                                                             Date sbnr

## 2013-11-13 ENCOUNTER — Ambulatory Visit (HOSPITAL_COMMUNITY)
Admission: RE | Admit: 2013-11-13 | Discharge: 2013-11-13 | Disposition: A | Discharge status: Home / Self Care | Payer: BC Managed Care – PPO | Source: Ambulatory Visit | Attending: Orthopedic Surgery | Admitting: Orthopedic Surgery

## 2013-11-13 DIAGNOSIS — M545 Low back pain, unspecified: Secondary | ICD-10-CM | POA: Insufficient documentation

## 2013-11-13 DIAGNOSIS — IMO0001 Reserved for inherently not codable concepts without codable children: Secondary | ICD-10-CM | POA: Insufficient documentation

## 2013-11-13 DIAGNOSIS — I10 Essential (primary) hypertension: Secondary | ICD-10-CM | POA: Diagnosis not present

## 2013-11-13 DIAGNOSIS — M5410 Radiculopathy, site unspecified: Secondary | ICD-10-CM

## 2013-11-13 DIAGNOSIS — R29898 Other symptoms and signs involving the musculoskeletal system: Secondary | ICD-10-CM | POA: Diagnosis not present

## 2013-11-13 NOTE — Progress Notes (Signed)
Physical Therapy Treatment Patient Details  Name: Jo Hale MRN: [id number] Date of Birth: December 24, 1960  Today's Date: 11/13/2013 Time: 1025-1110 PT Time Calculation (min): 45 min Charge: there ex 1025-1050; tx 1055-1110 Visit#: 2 of 12  Re-eval: 12/07/13    Authorization: BCBS  Subjective: Symptoms/Limitations Symptoms: Pt states no radicular pain at this time.  She has not been able to complete the exercises on a regular basis Pain Assessment Pain Location: Back Pain Orientation: Right     Exercise/Treatments     Stretches Single Knee to Chest Stretch: 3 reps;30 seconds   Supine Ab Set: 10 reps Bent Knee Raise: 10 reps Bridge: 10 reps Straight Leg Raise: 10 reps    Modalities Modalities: Traction Traction Type of Traction: Lumbar Min (lbs): static Max (lbs): 80 Hold Time: static  Physical Therapy Assessment and Plan PT Assessment and Plan Clinical Impression Statement: Pt unable to tell if traction decreased pain or not.  But she is not having radicular sx at this time.  Pt needs constant verbal and physical cuing to complete stabilization properly.   Noted tightness in hip flexor but good hamstring length  PT Plan: begin prone exercises.        Problem List Patient Active Problem List   Diagnosis Date Noted  . Radicular low back pain 11/07/2013  . Leg weakness, bilateral 11/07/2013  . Lumbago 10/09/2013  . Posterior pain of right hip 07/31/2013  . Esophageal reflux 01/25/2013  . Essential hypertension, benign 01/25/2013  . Rectal bleed 01/28/2012  . Constipation 01/28/2012  . Pain in joint, shoulder region 08/02/2011  . Muscle weakness (generalized) 08/02/2011  . Status post rotator cuff repair 08/02/2011  . FOOT PAIN 12/22/2009  . 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 POPLITEAL CYST, LEFT 06/30/2009  . MEDIAL MENISCUS TEAR, LEFT 06/30/2009       GP    Aaric Dolph,CINDY 11/13/2013, 11:08 AM

## 2013-11-21 ENCOUNTER — Ambulatory Visit (HOSPITAL_COMMUNITY)
Admission: RE | Admit: 2013-11-21 | Discharge: 2013-11-21 | Disposition: A | Discharge status: Home / Self Care | Payer: BC Managed Care – PPO | Source: Ambulatory Visit | Attending: Orthopedic Surgery | Admitting: Orthopedic Surgery

## 2013-11-21 DIAGNOSIS — M5410 Radiculopathy, site unspecified: Secondary | ICD-10-CM

## 2013-11-21 DIAGNOSIS — IMO0001 Reserved for inherently not codable concepts without codable children: Secondary | ICD-10-CM | POA: Diagnosis not present

## 2013-11-21 DIAGNOSIS — R29898 Other symptoms and signs involving the musculoskeletal system: Secondary | ICD-10-CM

## 2013-11-21 NOTE — Progress Notes (Signed)
Physical Therapy Treatment Patient Details  Name: Jo Hale MRN: [id number] Date of Birth: [birth year]  Today's Date: 11/21/2013 Time: 5956-3875 PT Time Calculation (min): 41 min Charge: TE 6433-2951, Manual [phone]  Visit#: 3 of 12  Re-eval: 12/07/13 Assessment Diagnosis: LBP Next MD Visit: Aline Brochure unscheduled  Authorization: BCBS  Authorization Time Period:    Authorization Visit#:   of     Subjective: Symptoms/Limitations Symptoms: Pt stated she did not feel a lot with lumbar traction last time.  Pain scale 4-5/10 lower back on right side.  No radiculuar symptoms today. Pain Assessment Currently in Pain?: Yes Pain Score: 5  Pain Location: Back Pain Orientation: Lower;Right  Objective:   Exercise/Treatments Aerobic Tread Mill: 2.0 mph x 5' with incline 3 and ab sets  following MET Supine Ab Set: 10 reps Bent Knee Raise: 10 reps;5 seconds Bridge: 10 reps Prone  Single Arm Raise: Right;Left;10 reps;5 seconds Straight Leg Raise: 10 reps;5 seconds Other Prone Lumbar Exercises: wback, rows, shoulder extension 10x 5"  Manual Therapy Manual Therapy: Other (comment) Other Manual Therapy: Muscle energy technique for Rt SI anterior rotation f/b ab sets and gait training on TM x 5'  Physical Therapy Assessment and Plan PT Assessment and Plan Clinical Impression Statement: Muscle energy technique complete to improve Rt SI anterior rotation followed by core strengthening exercises and gait on treadmill to set SI alignment. Held traction due to no reports of radicular symptoms today. Progressed to prone exercises for postural and gluteal strengthening with cueing for stabilty wtih exercises. Reports decreased pain at end of session. PT Plan: Check SI alignment, continue with prone exercises and progress as tolerated per PT POC.  Return to traction if radicular symptoms.    Goals PT Short Term Goals PT Short Term Goal 1: Pt pain to radiate to the knee only PT Short  Term Goal 2: Pt to state she can walk for 2-3 hours without pain PT Short Term Goal 2 - Progress: Progressing toward goal PT Short Term Goal 3: Pt to be able to sit for an hour without increased pain  PT Short Term Goal 3 - Progress: Progressing toward goal PT Short Term Goal 4: Pain level to be no greater than a 5/10 80% of the day. PT Short Term Goal 4 - Progress: Progressing toward goal PT Long Term Goals PT Long Term Goal 1: Pt to have no radicular sx PT Long Term Goal 2: Pt pain to be at the most a 3/10 80% of the day Long Term Goal 3: Pt to be I in advance HEP Long Term Goal 4: Pt to have no difficulty getting to sleep   Problem List Patient Active Problem List   Diagnosis Date Noted  . Radicular low back pain 11/07/2013  . Leg weakness, bilateral 11/07/2013  . Lumbago 10/09/2013  . Posterior pain of right hip 07/31/2013  . Esophageal reflux 01/25/2013  . Essential hypertension, benign 01/25/2013  . Rectal bleed 01/28/2012  . Constipation 01/28/2012  . Pain in joint, shoulder region 08/02/2011  . Muscle weakness (generalized) 08/02/2011  . Status post rotator cuff repair 08/02/2011  . FOOT PAIN 12/22/2009  . 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 POPLITEAL CYST, LEFT 06/30/2009  . MEDIAL MENISCUS TEAR, LEFT 06/30/2009    PT - End of Session Activity Tolerance: Patient tolerated treatment well General Behavior During Therapy: [facility] for tasks assessed/performed  GP    Aldona Lento 11/21/2013, 12:58 PM

## 2013-11-22 ENCOUNTER — Inpatient Hospital Stay (HOSPITAL_COMMUNITY)
Admission: RE | Admit: 2013-11-22 | Payer: BC Managed Care – PPO | Source: Ambulatory Visit | Admitting: Physical Therapy

## 2013-11-26 ENCOUNTER — Ambulatory Visit (HOSPITAL_COMMUNITY)
Admission: RE | Admit: 2013-11-26 | Discharge: 2013-11-26 | Disposition: A | Discharge status: Home / Self Care | Payer: BC Managed Care – PPO | Source: Ambulatory Visit | Attending: Orthopedic Surgery | Admitting: Orthopedic Surgery

## 2013-11-26 ENCOUNTER — Other Ambulatory Visit: Payer: Self-pay | Admitting: Family Medicine

## 2013-11-26 DIAGNOSIS — IMO0001 Reserved for inherently not codable concepts without codable children: Secondary | ICD-10-CM | POA: Diagnosis not present

## 2013-11-26 NOTE — Progress Notes (Signed)
Physical Therapy Re-evaluation  Patient Details  Name: Jo Hale MRN: [id number] Date of Birth: 1960-11-14  Today's Date: 11/26/2013 Time: 0805-0845 PT Time Calculation (min): 40 min Charges:  Manual [phone] (8') MMT/ROM [phone] (10'), self care [phone]              Visit#: 4 of 12  Re-eval: 12/07/13 Assessment Diagnosis: LBP Next MD Visit: Aline Brochure unscheduled  Authorization: BCBS     Subjective Symptoms/Limitations Symptoms: Pt states she is still having discomfort, especially when she lays down at night.  Currently without pain, however varies 2-8/10 with radicular symptoms on Rt to knee occassionally. Pain Assessment Currently in Pain?: No/denies   Sensation/Coordination/Flexibility/Functional Tests Functional Tests Functional Tests: FOTO now 59% FS (was 89%)  Assessment RLE Strength Right Hip Flexion: 5/5 (was 5/5) Right Hip Extension: 4/5 (was 3-/5) Right Hip ABduction: 5/5 (was 5/5) Right Knee Flexion: 5/5 (was 5/5) Right Knee Extension: 5/5 (was 4/5) Right Ankle Dorsiflexion: 5/5 (was 5/5) LLE Strength Left Hip Flexion: 5/5 (was 5/5) Left Hip Extension: 4/5 (was 3/5) Left Hip ABduction: 5/5 (was 5/5) Left Knee Flexion: 5/5 (was 5/5) Left Knee Extension: 5/5 (was 4/5) Left Ankle Dorsiflexion: 5/5 (was 5/5) Lumbar AROM Lumbar Flexion: WNL no pain (was WNL but with pain) Lumbar Extension: WNL no pain (was WNL) Lumbar - Right Side Bend: WNL no pain (was WNL) Lumbar - Left Side Bend: WNL no pain (was WNL but with pain) Lumbar - Right Rotation: WNL no pain  (was decreased 20%) Lumbar - Left Rotation: WNL no pain (was decreased 20%)   Manual Therapy Manual Therapy: Other (comment) Other Manual Therapy: Muscle energy technique for Lt SI anterior rotation (was Rt ant rot last visit) f/b ab sets and gait training on TM x 5' at 1.8 mph, 3% incline  Physical Therapy Assessment and Plan PT Assessment and Plan Clinical Impression Statement: Pt has only  completed 3/12 rehab sessions due to work conflicts and clinic moving/unavailability of appointments.  Pt has made progress towards all goals, however is still not able to sleep well at night due to pain/discomfort.  Radiculopathy has overall decreased but continues to have symptoms to her knee occasionally.  Pt is independent with HEP, however admits to not compleeing as she should due to work schedule.  Pt would benefit from continued therapy to meet all remaining unmet goals. PT Plan: Recommend to continue therapy X 4 more weeks to progress toward goals.     Goals Home Exercise Program Pt/caregiver will Perform Home Exercise Program: For increased ROM PT Goal: Perform Home Exercise Program - Progress: Met PT Short Term Goals PT Short Term Goal 1: Pt pain to radiate to the knee only PT Short Term Goal 1 - Progress: Met PT Short Term Goal 2: Pt to state she can walk for 2-3 hours without pain PT Short Term Goal 2 - Progress: Progressing toward goal PT Short Term Goal 3: Pt to be able to sit for an hour without increased pain  PT Short Term Goal 3 - Progress: Met PT Short Term Goal 4: Pain level to be no greater than a 5/10 80% of the day. PT Short Term Goal 4 - Progress: Progressing toward goal PT Long Term Goals PT Long Term Goal 1: Pt to have no radicular sx PT Long Term Goal 1 - Progress: Progressing toward goal PT Long Term Goal 2: Pt pain to be at the most a 3/10 80% of the day PT Long Term Goal 2 - Progress: Progressing toward  goal Long Term Goal 3: Pt to be I in advance HEP Long Term Goal 3 Progress: Progressing toward goal Long Term Goal 4: Pt to have no difficulty getting to sleep  Long Term Goal 4 Progress: Progressing toward goal  Problem List Patient Active Problem List   Diagnosis Date Noted  . Radicular low back pain 11/07/2013  . Leg weakness, bilateral 11/07/2013  . Lumbago 10/09/2013  . Posterior pain of right hip 07/31/2013  . Esophageal reflux 01/25/2013  .  Essential hypertension, benign 01/25/2013  . Rectal bleed 01/28/2012  . Constipation 01/28/2012  . Pain in joint, shoulder region 08/02/2011  . Muscle weakness (generalized) 08/02/2011  . Status post rotator cuff repair 08/02/2011  . FOOT PAIN 12/22/2009  . 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 POPLITEAL CYST, LEFT 06/30/2009  . MEDIAL MENISCUS TEAR, LEFT 06/30/2009    PT - End of Session Activity Tolerance: Patient tolerated treatment well General Behavior During Therapy: WFL for tasks assessed/performed   Teena Irani, PTA/CLT 11/26/2013, 9:46 AM

## 2013-11-27 ENCOUNTER — Other Ambulatory Visit: Payer: Self-pay | Admitting: *Deleted

## 2013-11-27 MED ORDER — LOCOID LIPOCREAM 0.1 % EX CREA: TOPICAL_CREAM | CUTANEOUS | Status: DC

## 2013-11-28 ENCOUNTER — Ambulatory Visit (HOSPITAL_COMMUNITY): Payer: BC Managed Care – PPO | Admitting: Physical Therapy

## 2013-11-30 ENCOUNTER — Ambulatory Visit (HOSPITAL_COMMUNITY): Payer: BC Managed Care – PPO | Admitting: Physical Therapy

## 2013-12-03 ENCOUNTER — Ambulatory Visit (HOSPITAL_COMMUNITY)
Admission: RE | Admit: 2013-12-03 | Discharge: 2013-12-03 | Disposition: A | Discharge status: Home / Self Care | Payer: BC Managed Care – PPO | Source: Ambulatory Visit | Attending: Orthopedic Surgery | Admitting: Orthopedic Surgery

## 2013-12-03 DIAGNOSIS — M5410 Radiculopathy, site unspecified: Secondary | ICD-10-CM

## 2013-12-03 DIAGNOSIS — IMO0001 Reserved for inherently not codable concepts without codable children: Secondary | ICD-10-CM | POA: Diagnosis not present

## 2013-12-03 DIAGNOSIS — R29898 Other symptoms and signs involving the musculoskeletal system: Secondary | ICD-10-CM

## 2013-12-03 NOTE — Progress Notes (Signed)
Physical Therapy Treatment Patient Details  Name: Jo Hale MRN: [id number] Date of Birth: 09/15/1960  Today's Date: 12/03/2013 Time: 0800-0844 PT Time Calculation (min): 44 min Charge: manual 0800-0808, TE [id number]  Visit#: 5 of 12  Re-eval: 12/24/13 Assessment Diagnosis: LBP Next MD Visit: Aline Brochure unscheduled  Authorization: BCBS  Authorization Time Period:    Authorization Visit#:   of     Subjective: Symptoms/Limitations Symptoms: Pt stated a small pinch remains on Rt side lower back.  Current pain scale 4/10 Rt hip only.  No reports of radicular symptoms today. Pain Assessment Currently in Pain?: Yes Pain Score: 4  Pain Location: Back Pain Orientation: Lower;Right  Objective:   Exercise/Treatments Stretches Hip Flexor Stretch: 3 reps;30 seconds;Limitations Hip Flexor Stretch Limitations: standing on 8in step Piriformis Stretch: 2 reps;30 seconds Aerobic Tread Mill: 2.0 mph x 5' with incline 3 and ab sets  following MET Supine Ab Set: 10 reps;5 seconds Bent Knee Raise: 10 reps;5 seconds Bridge: 10 reps Prone  Single Arm Raise: Right;Left;10 reps;5 seconds Straight Leg Raise: 10 reps;5 seconds Opposite Arm/Leg Raise: Right arm/Left leg;Left arm/Right leg;5 reps;5 seconds Other Prone Lumbar Exercises: wback, rows, shoulder extension 10x 5"  Manual Therapy Manual Therapy: Other (comment) Other Manual Therapy: Muscle energy technique for Rt SI anterior rotation f/b core strengthening and gait training on TM  Physical Therapy Assessment and Plan PT Assessment and Plan Clinical Impression Statement: Muscle energy technique complete for Rt SI anterior rotation f/b core strengthening exercises and gait on treadmill to improve alignment.  No reports of radicular symptoms through session so held traction this session.  Progressed prone exercises with opposite arm and leg with cueing for stabiltiy and form.  Noted tight ER and hip flexors, stretches added to  improve hip mobilitty  Pt reported pain reduced at end of session.   PT Plan: Continue with current POC.  Check SI alignment, progress core strengthening, traction as needed for radicular symptoms.    Goals PT Short Term Goals PT Short Term Goal 1: Pt pain to radiate to the knee only PT Short Term Goal 1 - Progress: Met PT Short Term Goal 2: Pt to state she can walk for 2-3 hours without pain PT Short Term Goal 2 - Progress: Progressing toward goal PT Short Term Goal 3: Pt to be able to sit for an hour without increased pain  PT Short Term Goal 3 - Progress: Met PT Short Term Goal 4: Pain level to be no greater than a 5/10 80% of the day. PT Short Term Goal 4 - Progress: Progressing toward goal PT Long Term Goals PT Long Term Goal 1: Pt to have no radicular sx PT Long Term Goal 1 - Progress: Progressing toward goal PT Long Term Goal 2: Pt pain to be at the most a 3/10 80% of the day Long Term Goal 3: Pt to be I in advance HEP Long Term Goal 4: Pt to have no difficulty getting to sleep   Problem List Patient Active Problem List   Diagnosis Date Noted  . Radicular low back pain 11/07/2013  . Leg weakness, bilateral 11/07/2013  . Lumbago 10/09/2013  . Posterior pain of right hip 07/31/2013  . Esophageal reflux 01/25/2013  . Essential hypertension, benign 01/25/2013  . Rectal bleed 01/28/2012  . Constipation 01/28/2012  . Pain in joint, shoulder region 08/02/2011  . Muscle weakness (generalized) 08/02/2011  . Status post rotator cuff repair 08/02/2011  . FOOT PAIN 12/22/2009  . 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  POPLITEAL CYST, LEFT 06/30/2009  . MEDIAL MENISCUS TEAR, LEFT 06/30/2009    PT - End of Session Activity Tolerance: Patient tolerated treatment well General Behavior During Therapy: [facility] for tasks assessed/performed  GP    Aldona Lento 12/03/2013, 9:01 AM

## 2013-12-05 ENCOUNTER — Ambulatory Visit (HOSPITAL_COMMUNITY): Payer: BC Managed Care – PPO | Admitting: Physical Therapy

## 2013-12-07 ENCOUNTER — Ambulatory Visit (HOSPITAL_COMMUNITY)
Admission: RE | Admit: 2013-12-07 | Discharge: 2013-12-07 | Disposition: A | Discharge status: Home / Self Care | Payer: BC Managed Care – PPO | Source: Ambulatory Visit | Attending: Family Medicine | Admitting: Family Medicine

## 2013-12-07 DIAGNOSIS — IMO0001 Reserved for inherently not codable concepts without codable children: Secondary | ICD-10-CM | POA: Diagnosis not present

## 2013-12-07 NOTE — Progress Notes (Signed)
Physical Therapy Treatment Patient Details  Name: Jo Hale MRN: [id number] Date of Birth: Aug 20, 1960  Today's Date: 12/07/2013 Time: 0263-7858 PT Time Calculation (min): 42 min There ex [phone]; self care [phone]  Visit#: 6 of 12  Re-eval: 12/24/13    Authorization: BCBS   Subjective: Symptoms/Limitations Symptoms: Pt states that she is not having any pain but would like her SI checked out anyways.  Pain Assessment Pain Score: 0-No pain     Exercise/Treatments   Stretches Pelvic Tilt: 5 reps (x2)   Standing Wall Slides: 10 reps Other Standing Lumbar Exercises: B UE flex with 2" wt keeping back still  Other Standing Lumbar Exercises: wall pushup x 10    Supine Bridge: 15 reps Other Supine Lumbar Exercises: pilates 50   Prone  Straight Leg Raise: 10 reps Opposite Arm/Leg Raise: 10 reps Quadruped Straight Leg Raise: Limitations;5 reps Straight Leg Raises Limitations: attempted but pt was not stabilizing correctly     Physical Therapy Assessment and Plan PT Assessment and Plan Clinical Impression Statement: Pt SI checked with no malalignment.  Pt went over lifting techniques as pt demonstrates poor body mechanics during lifting.  Pt also went over proper way to transend from sit to supine to sit as pt was completing a reverse sit up causing increased pressure and pain.  Added standing activity to become more functional  PT Plan: Work on more standing and higher level stabilization as well as review body mechanics for both lifting and tranitional motion        Problem List Patient Active Problem List   Diagnosis Date Noted  . Radicular low back pain 11/07/2013  . Leg weakness, bilateral 11/07/2013  . Lumbago 10/09/2013  . Posterior pain of right hip 07/31/2013  . Esophageal reflux 01/25/2013  . Essential hypertension, benign 01/25/2013  . Rectal bleed 01/28/2012  . Constipation 01/28/2012  . Pain in joint, shoulder region 08/02/2011  . Muscle weakness  (generalized) 08/02/2011  . Status post rotator cuff repair 08/02/2011  . FOOT PAIN 12/22/2009  . 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 POPLITEAL CYST, LEFT 06/30/2009  . MEDIAL MENISCUS TEAR, LEFT 06/30/2009       GP    RUSSELL,CINDY 12/07/2013, 11:19 AM

## 2013-12-12 ENCOUNTER — Ambulatory Visit (HOSPITAL_COMMUNITY)
Admission: RE | Admit: 2013-12-12 | Discharge: 2013-12-12 | Disposition: A | Discharge status: Home / Self Care | Payer: BC Managed Care – PPO | Source: Ambulatory Visit | Attending: Family Medicine | Admitting: Family Medicine

## 2013-12-12 DIAGNOSIS — M5410 Radiculopathy, site unspecified: Secondary | ICD-10-CM

## 2013-12-12 DIAGNOSIS — IMO0001 Reserved for inherently not codable concepts without codable children: Secondary | ICD-10-CM | POA: Insufficient documentation

## 2013-12-12 DIAGNOSIS — I10 Essential (primary) hypertension: Secondary | ICD-10-CM | POA: Diagnosis not present

## 2013-12-12 DIAGNOSIS — R29898 Other symptoms and signs involving the musculoskeletal system: Secondary | ICD-10-CM | POA: Insufficient documentation

## 2013-12-12 DIAGNOSIS — M545 Low back pain, unspecified: Secondary | ICD-10-CM | POA: Diagnosis not present

## 2013-12-12 NOTE — Progress Notes (Signed)
Physical Therapy Treatment Patient Details  Name: Jo Hale MRN: [id number] Date of Birth: 08/04/1960  Today's Date: 12/12/2013 Time: 0810-0843 PT Time Calculation (min): 33 min Charge: TE 0240-9735  Visit#: 7 of 12  Re-eval: 12/24/13 Assessment Diagnosis: LBP Next MD Visit: Aline Brochure unscheduled  Authorization: [employer]  Authorization Time Period:    Authorization Visit#:   of     Subjective: Symptoms/Limitations Symptoms: Pt reported pain scale 2-3/10, most diffiiculty at night with pain keeping her tossing and turning.  No reports of radicular symptoms today. Pain Assessment Currently in Pain?: Yes Pain Score: 3  Pain Location: Back Pain Orientation: Lower;Right  Objective:   Exercise/Treatments Stretches Pelvic Tilt: 5 reps Standing Heel Raises: 10 reps;Limitations Heel Raises Limitations: to overhead cabinet with proper lifting Lifting: From floor;From waist;to overhead;10 reps;Limitations Lifting Limitations: 8# box Forward Lunge: 10 reps;Limitations Forward Lunge Limitations: onto 6in step Other Standing Lumbar Exercises: Rockerboard with ab set Other Standing Lumbar Exercises: wall pushup x 10  Quadruped Single Arm Raise: Right;Left;5 reps Straight Leg Raise: 5 reps     Physical Therapy Assessment and Plan PT Assessment and Plan Clinical Impression Statement: SI within alignment, no MEt required.  Reviewed proper lifting and transitions to improve body mechanics during lifting and carrying, pt able to verbalize but does continue to require cueing for proper weight distribution with squats.  Progressed core strengthening with stabilty exercises and  quadruped activites for back strengthening with cueing to improve form.  No reports of pain at end of session. PT Plan: Work on more standing and higher level stabilization as well as review body mechanics for both lifting and tranitional motion    Goals PT Short Term Goals PT Short Term Goal 1: Pt pain to  radiate to the knee only PT Short Term Goal 2: Pt to state she can walk for 2-3 hours without pain PT Short Term Goal 2 - Progress: Progressing toward goal PT Short Term Goal 3: Pt to be able to sit for an hour without increased pain  PT Short Term Goal 4: Pain level to be no greater than a 5/10 80% of the day. PT Short Term Goal 4 - Progress: Progressing toward goal PT Long Term Goals PT Long Term Goal 1: Pt to have no radicular sx PT Long Term Goal 1 - Progress: Progressing toward goal PT Long Term Goal 2: Pt pain to be at the most a 3/10 80% of the day PT Long Term Goal 2 - Progress: Progressing toward goal Long Term Goal 3: Pt to be I in advance HEP Long Term Goal 3 Progress: Progressing toward goal Long Term Goal 4: Pt to have no difficulty getting to sleep   Problem List Patient Active Problem List   Diagnosis Date Noted  . Radicular low back pain 11/07/2013  . Leg weakness, bilateral 11/07/2013  . Lumbago 10/09/2013  . Posterior pain of right hip 07/31/2013  . Esophageal reflux 01/25/2013  . Essential hypertension, benign 01/25/2013  . Rectal bleed 01/28/2012  . Constipation 01/28/2012  . Pain in joint, shoulder region 08/02/2011  . Muscle weakness (generalized) 08/02/2011  . Status post rotator cuff repair 08/02/2011  . FOOT PAIN 12/22/2009  . 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 POPLITEAL CYST, LEFT 06/30/2009  . MEDIAL MENISCUS TEAR, LEFT 06/30/2009    PT - End of Session Activity Tolerance: Patient tolerated treatment well General Behavior During Therapy: [facility] for tasks assessed/performed  GP    Aldona Lento 12/12/2013, 8:54 AM

## 2013-12-14 ENCOUNTER — Ambulatory Visit (HOSPITAL_COMMUNITY)
Admission: RE | Admit: 2013-12-14 | Discharge: 2013-12-14 | Disposition: A | Discharge status: Home / Self Care | Payer: BC Managed Care – PPO | Source: Ambulatory Visit | Attending: Family Medicine | Admitting: Family Medicine

## 2013-12-14 DIAGNOSIS — M5410 Radiculopathy, site unspecified: Secondary | ICD-10-CM

## 2013-12-14 DIAGNOSIS — IMO0001 Reserved for inherently not codable concepts without codable children: Secondary | ICD-10-CM | POA: Diagnosis not present

## 2013-12-14 DIAGNOSIS — R29898 Other symptoms and signs involving the musculoskeletal system: Secondary | ICD-10-CM

## 2013-12-14 NOTE — Progress Notes (Signed)
Physical Therapy Treatment Patient Details  Name: Jo Hale MRN: [id number] Date of Birth: 05-28-1960  Today's Date: 12/14/2013 Time: 0823-0850 PT Time Calculation (min): 27 min Charge: TE [id number]  Visit#: 8 of 12  Re-eval: 12/24/13 Assessment Diagnosis: LBP Next MD Visit: Aline Brochure unscheduled  Authorization: [employer]  Authorization Time Period:    Authorization Visit#:   of     Subjective: Symptoms/Limitations Symptoms: Currently pain free, reported all her pain is while she is trying to sleep Pain Assessment Currently in Pain?: No/denies  Objective:  Exercise/Treatments Standing Heel Raises: 10 reps;Limitations Heel Raises Limitations: to overhead cabinet with proper lifting Lifting: From floor;From waist;to overhead;10 reps;Limitations Lifting Limitations: 8# box Forward Lunge: 10 reps;Limitations Forward Lunge Limitations: onto 4in step Side Lunge: 10 reps;Limitations Side Lunge Limitations: on 4in step Other Standing Lumbar Exercises: wall pushup x 10  Supine Bridge: 15 reps Prone  Opposite Arm/Leg Raise: Right arm/Left leg;Left arm/Right leg;10 reps Other Prone Lumbar Exercises: child's pose 2x 30"  Physical Therapy Assessment and Plan PT Assessment and Plan Clinical Impression Statement: Pt able to demonstrate appropriate technique with proper lifting with minimal cueing required.  No reports of pain through session.  PT Plan: Work on more standing and higher level stabilization as well as review body mechanics for both lifting and tranitional motion    Goals PT Short Term Goals PT Short Term Goal 1: Pt pain to radiate to the knee only PT Short Term Goal 2: Pt to state she can walk for 2-3 hours without pain PT Short Term Goal 3: Pt to be able to sit for an hour without increased pain  PT Short Term Goal 4: Pain level to be no greater than a 5/10 80% of the day. PT Long Term Goals PT Long Term Goal 1: Pt to have no radicular sx PT Long Term Goal 2:  Pt pain to be at the most a 3/10 80% of the day Long Term Goal 3: Pt to be I in advance HEP Long Term Goal 4: Pt to have no difficulty getting to sleep   Problem List Patient Active Problem List   Diagnosis Date Noted  . Radicular low back pain 11/07/2013  . Leg weakness, bilateral 11/07/2013  . Lumbago 10/09/2013  . Posterior pain of right hip 07/31/2013  . Esophageal reflux 01/25/2013  . Essential hypertension, benign 01/25/2013  . Rectal bleed 01/28/2012  . Constipation 01/28/2012  . Pain in joint, shoulder region 08/02/2011  . Muscle weakness (generalized) 08/02/2011  . Status post rotator cuff repair 08/02/2011  . FOOT PAIN 12/22/2009  . 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 POPLITEAL CYST, LEFT 06/30/2009  . MEDIAL MENISCUS TEAR, LEFT 06/30/2009    PT - End of Session Activity Tolerance: Patient tolerated treatment well General Behavior During Therapy: [facility] for tasks assessed/performed  GP    Aldona Lento 12/14/2013, 8:54 AM

## 2013-12-19 ENCOUNTER — Ambulatory Visit (HOSPITAL_COMMUNITY)
Admission: RE | Admit: 2013-12-19 | Discharge: 2013-12-19 | Disposition: A | Discharge status: Home / Self Care | Payer: BC Managed Care – PPO | Source: Ambulatory Visit | Attending: Orthopedic Surgery | Admitting: Orthopedic Surgery

## 2013-12-19 DIAGNOSIS — R29898 Other symptoms and signs involving the musculoskeletal system: Secondary | ICD-10-CM

## 2013-12-19 DIAGNOSIS — IMO0001 Reserved for inherently not codable concepts without codable children: Secondary | ICD-10-CM | POA: Diagnosis not present

## 2013-12-19 DIAGNOSIS — M5410 Radiculopathy, site unspecified: Secondary | ICD-10-CM

## 2013-12-19 NOTE — Progress Notes (Signed)
Physical Therapy Treatment Patient Details  Name: Jo Hale MRN: [id number] Date of Birth: 12/05/1960  Today's Date: 12/19/2013 Time: 1520-1600 PT Time Calculation (min): 40 min Charge: TE 1520-1600  Visit#: 9 of 12  Re-eval: 12/24/13 Assessment Diagnosis: LBP Next MD Visit: Aline Brochure unscheduled  Authorization: [employer]  Authorization Time Period:    Authorization Visit#:   of     Subjective: Symptoms/Limitations Symptoms: Pain free today, took pain medication prior nap, able to sleep without difficulty.  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 Pain Assessment Currently in Pain?: No/denies  Objective:   Exercise/Treatments Stretches Active Hamstring Stretch: 3 reps;30 seconds;Limitations Active Hamstring Stretch Limitations: 3 direction on 14in box Standing Heel Raises: 15 reps;Limitations Heel Raises Limitations: to overhead cabinet with proper lifting Lifting: From floor;From waist;to overhead;10 reps;Weights Lifting Weights (lbs): 13 Lifting Limitations: 5x 8# box, 5reps with 13# Forward Lunge: 10 reps;Limitations Forward Lunge Limitations: onto 2in step Side Lunge: 10 reps;Limitations Side Lunge Limitations: on 2in step Other Standing Lumbar Exercises: wall pushups 10x Other Standing Lumbar Exercises: gastroc slant board 3x 30" Prone  Other Prone Lumbar Exercises: child's pose 2x 30" Quadruped Opposite Arm/Leg Raise: Right arm/Left leg;Left arm/Right leg;10 reps;3 seconds;Limitations Opposite Arm/Leg Raise Limitations: with 1# dowel rod across back for stabiilty     Physical Therapy Assessment and Plan PT Assessment and Plan Clinical Impression Statement: Pt able to demonstrate appropraite technique with proper lifting, verbalizeing and demonstrating appropriate body mechanics.  Added hamstring and gastroc stretches this session following reports of tightness following work sitting on fork lift for hours  at a time.  Improved stabiltiy noted today with exercises.  No reoprts of pain through session.   PT Plan: Work on more standing and higher level stabilization as well as review body mechanics for both lifting and tranitional motion    Goals PT Short Term Goals PT Short Term Goal 1: Pt pain to radiate to the knee only PT Short Term Goal 1 - Progress: Met PT Short Term Goal 2: Pt to state she can walk for 2-3 hours without pain PT Short Term Goal 2 - Progress: Progressing toward goal PT Short Term Goal 3: Pt to be able to sit for an hour without increased pain  PT Short Term Goal 3 - Progress: Met PT Short Term Goal 4: Pain level to be no greater than a 5/10 80% of the day. PT Short Term Goal 4 - Progress: Progressing toward goal PT Long Term Goals PT Long Term Goal 1: Pt to have no radicular sx PT Long Term Goal 1 - Progress: Progressing toward goal PT Long Term Goal 2: Pt pain to be at the most a 3/10 80% of the day PT Long Term Goal 2 - Progress: Progressing toward goal Long Term Goal 3: Pt to be I in advance HEP Long Term Goal 3 Progress: Progressing toward goal Long Term Goal 4: Pt to have no difficulty getting to sleep  Long Term Goal 4 Progress: Progressing toward goal  Problem List Patient Active Problem List   Diagnosis Date Noted  . Radicular low back pain 11/07/2013  . Leg weakness, bilateral 11/07/2013  . Lumbago 10/09/2013  . Posterior pain of right hip 07/31/2013  . Esophageal reflux 01/25/2013  . Essential hypertension, benign 01/25/2013  . Rectal bleed 01/28/2012  . Constipation 01/28/2012  . Pain in joint, shoulder region 08/02/2011  . Muscle weakness (generalized) 08/02/2011  . Status post rotator cuff repair 08/02/2011  .  FOOT PAIN 12/22/2009  . 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 POPLITEAL CYST, LEFT 06/30/2009  . MEDIAL MENISCUS TEAR, LEFT 06/30/2009    PT - End of Session Activity Tolerance: Patient tolerated treatment well General Behavior  During Therapy: [facility] for tasks assessed/performed  GP    Aldona Lento 12/19/2013, 4:21 PM

## 2013-12-21 ENCOUNTER — Ambulatory Visit (HOSPITAL_COMMUNITY)
Admission: RE | Admit: 2013-12-21 | Discharge: 2013-12-21 | Disposition: A | Discharge status: Home / Self Care | Payer: BC Managed Care – PPO | Source: Ambulatory Visit | Attending: Family Medicine | Admitting: Family Medicine

## 2013-12-21 DIAGNOSIS — R29898 Other symptoms and signs involving the musculoskeletal system: Secondary | ICD-10-CM

## 2013-12-21 DIAGNOSIS — M5410 Radiculopathy, site unspecified: Secondary | ICD-10-CM

## 2013-12-21 DIAGNOSIS — IMO0001 Reserved for inherently not codable concepts without codable children: Secondary | ICD-10-CM | POA: Diagnosis not present

## 2013-12-21 NOTE — Progress Notes (Signed)
Physical Therapy Treatment Patient Details  Name: Jo Hale MRN: [id number] Date of Birth: [birth year]  Today's Date: 12/21/2013 Time: 0109-3235 PT Time Calculation (min): 31 min Charge: TE 5732-2025  Visit#: 10 of 12  Re-eval: 12/24/13 Assessment Diagnosis: LBP Next MD Visit: Aline Brochure unscheduled  Authorization: [employer]  Authorization Time Period:    Authorization Visit#:   of     Subjective: Symptoms/Limitations Symptoms: Pain free today, took pain medicaton prior sleep today.   Pain Assessment Currently in Pain?: No/denies  Objective:   Exercise/Treatments Stretches Active Hamstring Stretch: 3 reps;30 seconds;Limitations Active Hamstring Stretch Limitations: 3 direction on 14in box Standing Heel Raises: 15 reps;Limitations Heel Raises Limitations: to overhead cabinet with proper lifting Functional Squats: 5 reps;Limitations;3 seconds Functional Squats Limitations: squat reach matrix with 3# Lifting: From floor;From waist;to overhead;10 reps;Weights Lifting Weights (lbs): 13 Other Standing Lumbar Exercises: wall pushups 10x; hip hike Bil on 2in step 10x each Other Standing Lumbar Exercises: Warrior pose III, tree pose 2x 30" bil LE and vector stance no HHA 3x 5"     Physical Therapy Assessment and Plan PT Assessment and Plan Clinical Impression Statement: Pt demonstrated appropriate technique with proper lifting this session with no cueing required.  Progressed stability and strengthening for gluteal and overall hip musculature with SLS activities including vector stance, warrior and tree pose.   PT Plan: Work on more standing and higher level stabilization as well as review body mechanics for both lifting and tranitional motion    Goals PT Short Term Goals PT Short Term Goal 1: Pt pain to radiate to the knee only PT Short Term Goal 2: Pt to state she can walk for 2-3 hours without pain PT Short Term Goal 3: Pt to be able to sit for an hour without increased  pain  PT Short Term Goal 4: Pain level to be no greater than a 5/10 80% of the day. PT Long Term Goals PT Long Term Goal 1: Pt to have no radicular sx PT Long Term Goal 2: Pt pain to be at the most a 3/10 80% of the day Long Term Goal 3: Pt to be I in advance HEP Long Term Goal 4: Pt to have no difficulty getting to sleep   Problem List Patient Active Problem List   Diagnosis Date Noted  . Radicular low back pain 11/07/2013  . Leg weakness, bilateral 11/07/2013  . Lumbago 10/09/2013  . Posterior pain of right hip 07/31/2013  . Esophageal reflux 01/25/2013  . Essential hypertension, benign 01/25/2013  . Rectal bleed 01/28/2012  . Constipation 01/28/2012  . Pain in joint, shoulder region 08/02/2011  . Muscle weakness (generalized) 08/02/2011  . Status post rotator cuff repair 08/02/2011  . FOOT PAIN 12/22/2009  . 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 POPLITEAL CYST, LEFT 06/30/2009  . MEDIAL MENISCUS TEAR, LEFT 06/30/2009    PT - End of Session Activity Tolerance: Patient tolerated treatment well General Behavior During Therapy: [facility] for tasks assessed/performed  GP    Aldona Lento 12/21/2013, 5:49 PM

## 2013-12-24 ENCOUNTER — Ambulatory Visit (HOSPITAL_COMMUNITY): Payer: BC Managed Care – PPO | Admitting: Physical Therapy

## 2013-12-26 ENCOUNTER — Ambulatory Visit (HOSPITAL_COMMUNITY)
Admission: RE | Admit: 2013-12-26 | Discharge: 2013-12-26 | Disposition: A | Discharge status: Home / Self Care | Payer: BC Managed Care – PPO | Source: Ambulatory Visit | Attending: Family Medicine | Admitting: Family Medicine

## 2013-12-26 DIAGNOSIS — IMO0001 Reserved for inherently not codable concepts without codable children: Secondary | ICD-10-CM | POA: Diagnosis not present

## 2013-12-26 NOTE — Evaluation (Signed)
Physical Therapy Re-Evaluation  Patient Details  Name: Jo Hale MRN: [id number] Date of Birth: 01/13/1961  Today's Date: 12/26/2013 Time: 1520-1610 PT Time Calculation (min): 50 min   TE 1520-1540, MMT/ROM           Visit#: 11 of 16  Re-eval: 01/16/14 Assessment Diagnosis: LBP Next MD Visit: Aline Brochure none   Past Medical History:  Past Medical History  Diagnosis Date  . GERD (gastroesophageal reflux disease)   . Hypertension   . Fibroids, intramural     450-600 gm uterus  . Abnormal pap   . Fibroids   . Posterior pain of right hip 07/31/2013   Past Surgical History:  Past Surgical History  Procedure Laterality Date  . Cesarean section    . Tubal ligation    . Colonoscopy  02/19/2011    Fields-incomplete exam, otherwise normal   . Laparoscopic supracervical hysterectomy  03/02/2011    Procedure: LAPAROSCOPIC SUPRACERVICAL HYSTERECTOMY;  Surgeon: Jonnie Kind, MD;  Location: [facility];  Service: Gynecology;  Laterality: N/A;  . Partial hysterectomy    . Rotator cuff repair      left    Subjective Symptoms/Limitations Symptoms: Pt reports pain 4/10 in Rt sided low back today.  Pt reports radicular symptoms in the Rt buttock everyday, though pain no longer goes down into the leg.   How long can you stand comfortably?: Pt reprots she is able to stand for ~1 hour prior to a seated rest break (was 10 minutes). Pain Assessment Currently in Pain?: Yes Pain Score: 4  (Worst 6-7/10   Best 2/10 ) Pain Location: Back Pain Orientation: Right   Sensation/Coordination/Flexibility/Functional Tests Flexibility 90/90: Positive (Lt 85 degrees, Rt 78 degrees limited by pain in Rt low back) Functional Tests Functional Tests: Elys Test Rt 3 3/4 inch, Lt 3 in heel to glute  Assessment RLE Strength Right Hip Flexion: 5/5 ((was 5/5)) Right Hip Extension:  (4+/5 (was 4/5)) Right Hip ABduction: 5/5 ((was 5/5)) Right Knee Flexion: 5/5 ((was 5/5)) Right Knee Extension: 5/5 ((was  5/5)) Right Ankle Dorsiflexion: 5/5 ((was 5/5)) LLE Strength Left Hip Flexion: 5/5 ((was 5/5)) Left Hip Extension:  (4+/5 (was 4/5)) Left Hip ABduction: 5/5 ((was 5/5)) Left Knee Flexion: 5/5 ((was 5/5)) Left Knee Extension: 5/5 ((was 5/5)) Left Ankle Dorsiflexion: 5/5 ((was 5/5))  Exercise/Treatments Stretches Active Hamstring Stretch: 3 reps;30 seconds;Limitations Active Hamstring Stretch Limitations: 3 Way on 18 inch stool Quad Stretch: 3 reps;20 seconds;Limitations Quad Stretch Limitations: Prone with rope Piriformis Stretch: 2 reps;20 seconds;Limitations Piriformis Stretch Limitations: Supine with rope  Physical Therapy Assessment and Plan PT Assessment and Plan Clinical Impression Statement: Re-assessment completed. Pt demonstrates great strength in LE,with only minor limitations in flexibility on the Rt compared to Lt side. Pt reports she feels like her pain is decreasing in intensity and frequency, with radiating pain only the Rt buttock (no longer down her leg). 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 Educated pt on importance of performing stretching and stability program at home, as pt reports she understands how to complete exercises though admits she doesn't complete HEP as often as she should. Pt did question PT regarding beginning walking program, and pt instructed in initiating performance of walking program for health and activity tolerance. Pt has currently met 3/4 STG, and 1/4 LTG, with limitations  in goals related to pain levels.  Recommend continued PT, though decreasing to 2x/week for 2 weeks to reinforce HEP and instruct pt in further exercise/walking programs. Pt will benefit from skilled therapeutic intervention in order to improve on the following deficits: Pain;Decreased strength;Decreased  range of motion Rehab Potential: Good PT Frequency: Min 2X/week PT Duration:  (2 weeks) PT Treatment/Interventions: Therapeutic exercise;Modalities PT Plan: Increase compliance with HEP, as anticipated d/c in 2 weeks.  Reinforce walking program and assess outcomes.     Goals PT Short Term Goals PT Short Term Goal 1: Pt pain to radiate to the knee only PT Short Term Goal 1 - Progress: Met PT Short Term Goal 2: Pt to state she can walk for 2-3 hours without pain PT Short Term Goal 2 - Progress: Progressing toward goal PT Short Term Goal 3: Pt to be able to sit for an hour without increased pain  PT Short Term Goal 3 - Progress: Met PT Short Term Goal 4: Pain level to be no greater than a 5/10 80% of the day. PT Short Term Goal 4 - Progress: Met PT Long Term Goals PT Long Term Goal 1: Pt to have no radicular sx PT Long Term Goal 1 - Progress: Progressing toward goal PT Long Term Goal 2: Pt pain to be at the most a 3/10 80% of the day PT Long Term Goal 2 - Progress: Progressing toward goal Long Term Goal 3: Pt to be I in advance HEP Long Term Goal 3 Progress: Met Long Term Goal 4: Pt to have no difficulty getting to sleep  Long Term Goal 4 Progress: Progressing toward goal  Problem List Patient Active Problem List   Diagnosis Date Noted  . Radicular low back pain 11/07/2013  . Leg weakness, bilateral 11/07/2013  . Lumbago 10/09/2013  . Posterior pain of right hip 07/31/2013  . Esophageal reflux 01/25/2013  . Essential hypertension, benign 01/25/2013  . Rectal bleed 01/28/2012  . Constipation 01/28/2012  . Pain in joint, shoulder region 08/02/2011  . Muscle weakness (generalized) 08/02/2011  . Status post rotator cuff repair 08/02/2011  . FOOT PAIN 12/22/2009  . 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 POPLITEAL CYST, LEFT 06/30/2009  . MEDIAL MENISCUS TEAR, LEFT 06/30/2009    PT - End of Session Activity Tolerance: Patient tolerated treatment well General Behavior During  Therapy: [facility] for tasks assessed/performed   Veronnica Hennings 12/26/2013, 4:29 PM  Physician Documentation Your signature is required to indicate approval of the treatment plan as stated above.  Please sign and either send electronically or make a copy of this report for your files and return this physician signed original.   Please mark one 1.__approve of plan  2. ___approve of plan with the following conditions.   ______________________________                                                          _____________________ Physician Signature  Date  

## 2013-12-28 ENCOUNTER — Ambulatory Visit (HOSPITAL_COMMUNITY)
Admission: RE | Admit: 2013-12-28 | Payer: BC Managed Care – PPO | Source: Ambulatory Visit | Attending: Family Medicine | Admitting: Family Medicine

## 2014-01-01 ENCOUNTER — Ambulatory Visit (HOSPITAL_COMMUNITY)
Admission: RE | Admit: 2014-01-01 | Payer: BC Managed Care – PPO | Source: Ambulatory Visit | Attending: Family Medicine | Admitting: Family Medicine

## 2014-01-03 ENCOUNTER — Inpatient Hospital Stay (HOSPITAL_COMMUNITY): Admission: RE | Admit: 2014-01-03 | Payer: BC Managed Care – PPO | Source: Ambulatory Visit

## 2014-01-08 ENCOUNTER — Ambulatory Visit (HOSPITAL_COMMUNITY): Payer: BC Managed Care – PPO | Admitting: Physical Therapy

## 2014-01-10 ENCOUNTER — Ambulatory Visit (HOSPITAL_COMMUNITY): Payer: BC Managed Care – PPO | Attending: Orthopedic Surgery | Admitting: Physical Therapy

## 2014-01-14 ENCOUNTER — Ambulatory Visit (HOSPITAL_COMMUNITY): Payer: BC Managed Care – PPO | Admitting: Physical Therapy

## 2014-01-18 ENCOUNTER — Ambulatory Visit (HOSPITAL_COMMUNITY): Payer: BC Managed Care – PPO

## 2014-01-18 ENCOUNTER — Other Ambulatory Visit: Payer: Self-pay | Admitting: Family Medicine

## 2014-02-11 ENCOUNTER — Encounter: Payer: Self-pay | Admitting: Adult Health

## 2014-02-12 NOTE — Addendum Note (Signed)
Encounter addended by: Leeroy Cha, PT on: 02/12/2014  4:53 PM<BR>     Documentation filed: Fast Note

## 2014-03-04 ENCOUNTER — Other Ambulatory Visit: Payer: Self-pay | Admitting: Family Medicine

## 2014-03-25 ENCOUNTER — Telehealth: Payer: Self-pay | Admitting: Family Medicine

## 2014-03-25 MED ORDER — TRIAMTERENE-HCTZ 37.5-25 MG PO TABS: 1.0000 | ORAL_TABLET | Freq: Every day | ORAL | Status: DC

## 2014-03-25 NOTE — Telephone Encounter (Signed)
Rx sent electronically to pharmacy. Patient notified. 

## 2014-03-25 NOTE — Telephone Encounter (Signed)
Pt needs enough BP med to get her through to her appt on the 24th for a med check She was made aware that when she makes an extension on her refill that she needs to  Make the ov sooner rather than later due to our scheduling issues  triamterene-hydrochlorothiazide (MAXZIDE-25) 37.5-25 MG per tablet

## 2014-04-04 ENCOUNTER — Encounter: Payer: Self-pay | Admitting: Family Medicine

## 2014-04-04 ENCOUNTER — Ambulatory Visit (INDEPENDENT_AMBULATORY_CARE_PROVIDER_SITE_OTHER): Payer: BC Managed Care – PPO | Admitting: Family Medicine

## 2014-04-04 VITALS — BP 148/80 | Ht 62.0 in | Wt 233.5 lb

## 2014-04-04 DIAGNOSIS — I878 Other specified disorders of veins: Secondary | ICD-10-CM

## 2014-04-04 DIAGNOSIS — Z79899 Other long term (current) drug therapy: Secondary | ICD-10-CM

## 2014-04-04 DIAGNOSIS — I10 Essential (primary) hypertension: Secondary | ICD-10-CM

## 2014-04-04 DIAGNOSIS — G5712 Meralgia paresthetica, left lower limb: Secondary | ICD-10-CM

## 2014-04-04 LAB — LIPID PANEL
Cholesterol: 180 mg/dL (ref 0–200)
HDL: 53 mg/dL (ref >39)
LDL Cholesterol: 114 mg/dL — ABNORMAL HIGH (ref 0–99)
TRIGLYCERIDES: 66 mg/dL (ref <150)
Total CHOL/HDL Ratio: 3.4 Ratio
VLDL: 13 mg/dL (ref 0–40)

## 2014-04-04 LAB — HEPATIC FUNCTION PANEL
ALK PHOS: 58 U/L (ref 39–117)
ALT: 17 U/L (ref 0–35)
AST: 18 U/L (ref 0–37)
Albumin: 3.8 g/dL (ref 3.5–5.2)
BILIRUBIN DIRECT: 0.1 mg/dL (ref 0.0–0.3)
Indirect Bilirubin: 0.2 mg/dL (ref 0.2–1.2)
TOTAL PROTEIN: 6.6 g/dL (ref 6.0–8.3)
Total Bilirubin: 0.3 mg/dL (ref 0.2–1.2)

## 2014-04-04 LAB — BASIC METABOLIC PANEL
BUN: 18 mg/dL (ref 6–23)
CO2: 26 mEq/L (ref 19–32)
CREATININE: 1.11 mg/dL — AB (ref 0.50–1.10)
Calcium: 8.6 mg/dL (ref 8.4–10.5)
Chloride: 107 mEq/L (ref 96–112)
Glucose, Bld: 89 mg/dL (ref 70–99)
Potassium: 3.9 mEq/L (ref 3.5–5.3)
Sodium: 141 mEq/L (ref 135–145)

## 2014-04-04 MED ORDER — TRIAMTERENE-HCTZ 37.5-25 MG PO TABS: 1.0000 | ORAL_TABLET | Freq: Every day | ORAL | Status: DC

## 2014-04-04 NOTE — Progress Notes (Signed)
   Subjective:    Patient ID: Jo Hale, female    DOB: 1961/03/08, 53 y.o.   MRN: [id number]  Hypertension This is a chronic problem. The current episode started more than 1 year ago. The problem has been gradually improving since onset. The problem is controlled. There are no associated agents to hypertension. There are no known risk factors for coronary artery disease. Treatments tried: Maxide. The current treatment provides significant improvement. There are no compliance problems.   taking the fluid pill regularly   Patient states that she has tingling and numbness in her left thigh region. Tingling and numbness of left ant thigh. Has occurred over last few months since gaining weight.  Hx of leg injury in the past  [email or web address]   Patient states that she thinks she has gotten bitten by a spider on her back. Had inflammed spot on should, wonedered if bit  Puffiness in legs and pain behind the knees. Worse as the days go by Swelling in the ankles not signifi an   Patient states that she is having trouble with fluid buildup behind her knees also.   Some pain and grinding in her knees at times. Left knee more than right.   Review of Systems No headache no chest pain no back pain no shortness breath no abdominal pain    Objective:   Physical Exam  Alert no major distress vital stable blood pressure 140/86 on repeat. HEENT normal. Lungs clear. Heart regular in rhythm. Shoulder postinflammatory rash knees crepitations bilateral left greater than right slight prominence behind both knees trace edema both ankles.  Left anterior lateral side diminished sensation to palpation    Assessment & Plan:  Impression 1 hypertension decent control #2 arthritis with likely mild effusion bilateral. Discussed. #3 meralgia paresthetica discussed #4 rash nonspecific #5 venous stasis #6 obesity plan diet exercise discussed appropriate medicines. Check every 6 months. Educated on  nature of current conditions. Recommend no further medicines at this time. WSL

## 2014-04-04 NOTE — Patient Instructions (Signed)
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

## 2014-04-05 DIAGNOSIS — G5712 Meralgia paresthetica, left lower limb: Secondary | ICD-10-CM | POA: Insufficient documentation

## 2014-04-05 DIAGNOSIS — I878 Other specified disorders of veins: Secondary | ICD-10-CM | POA: Insufficient documentation

## 2014-04-07 ENCOUNTER — Encounter: Payer: Self-pay | Admitting: Family Medicine

## 2014-04-11 ENCOUNTER — Other Ambulatory Visit: Payer: Self-pay | Admitting: Family Medicine

## 2014-04-11 DIAGNOSIS — Z1231 Encounter for screening mammogram for malignant neoplasm of breast: Secondary | ICD-10-CM

## 2014-04-22 ENCOUNTER — Ambulatory Visit (HOSPITAL_COMMUNITY)
Admission: RE | Admit: 2014-04-22 | Discharge: 2014-04-22 | Disposition: A | Discharge status: Home / Self Care | Payer: BLUE CROSS/BLUE SHIELD | Source: Ambulatory Visit | Attending: Family Medicine | Admitting: Family Medicine

## 2014-04-22 DIAGNOSIS — Z1231 Encounter for screening mammogram for malignant neoplasm of breast: Secondary | ICD-10-CM | POA: Diagnosis not present

## 2014-05-18 ENCOUNTER — Other Ambulatory Visit: Payer: Self-pay | Admitting: Family Medicine

## 2014-06-07 ENCOUNTER — Encounter: Payer: Self-pay | Admitting: Nurse Practitioner

## 2014-06-07 ENCOUNTER — Ambulatory Visit (INDEPENDENT_AMBULATORY_CARE_PROVIDER_SITE_OTHER): Payer: BLUE CROSS/BLUE SHIELD | Admitting: Nurse Practitioner

## 2014-06-07 VITALS — Temp 98.4°F | Ht 62.0 in | Wt 233.8 lb

## 2014-06-07 DIAGNOSIS — J300 Vasomotor rhinitis: Secondary | ICD-10-CM | POA: Diagnosis not present

## 2014-06-07 MED ORDER — AZITHROMYCIN 250 MG PO TABS: ORAL_TABLET | ORAL | Status: DC

## 2014-06-07 MED ORDER — FLUTICASONE PROPIONATE 50 MCG/ACT NA SUSP: 2.0000 | Freq: Every day | NASAL | Status: DC

## 2014-06-11 ENCOUNTER — Encounter: Payer: Self-pay | Admitting: Nurse Practitioner

## 2014-06-11 NOTE — Progress Notes (Signed)
Subjective:  Presents complaints of nonproductive cough and drainage for the past 2 days. No fever. Scratchy throat. Itching in the ears. No headache. Occasional cough. Cloudy mucus. No wheezing.  Objective:   Temp(Src) 98.4 F (36.9 C) (Oral)  Ht 5\' 2"  (1.575 m)  Wt 233 lb 12.8 oz (106.051 kg)  BMI 42.75 kg/m2  LMP 02/17/2011 NAD. Alert, oriented. TMs clear effusion, no erythema. Pharynx clear. Neck supple with mild soft anterior adenopathy. Lungs clear. Heart regular rate rhythm.  Assessment: Vasomotor rhinitis  Plan:  Meds ordered this encounter  Medications  . fluticasone (FLONASE) 50 MCG/ACT nasal spray    Sig: Place 2 sprays into both nostrils daily.    Dispense:  16 g    Refill:  5    Order Specific Question:  Supervising Provider    Answer:  Mikey Kirschner [[id number]]  . azithromycin (ZITHROMAX Z-PAK) 250 MG tablet    Sig: Take 2 tablets (500 mg) on  Day 1,  followed by 1 tablet (250 mg) once daily on Days 2 through 5.    Dispense:  6 each    Refill:  0    Order Specific Question:  Supervising Provider    Answer:  Mikey Kirschner [[id number]]   Start Flonase and OTC antihistamine as directed. Given written prescription for Zithromax for the weekend in case symptoms worsen. Call back if any further problems.

## 2014-08-05 ENCOUNTER — Other Ambulatory Visit: Payer: Managed Care, Other (non HMO) | Admitting: Adult Health

## 2014-08-15 ENCOUNTER — Other Ambulatory Visit: Payer: Managed Care, Other (non HMO) | Admitting: Adult Health

## 2014-08-27 ENCOUNTER — Ambulatory Visit (INDEPENDENT_AMBULATORY_CARE_PROVIDER_SITE_OTHER): Payer: BLUE CROSS/BLUE SHIELD | Admitting: Adult Health

## 2014-08-27 ENCOUNTER — Encounter: Payer: Self-pay | Admitting: Adult Health

## 2014-08-27 VITALS — BP 140/80 | HR 72 | Ht 62.0 in | Wt 238.0 lb

## 2014-08-27 DIAGNOSIS — Z01419 Encounter for gynecological examination (general) (routine) without abnormal findings: Secondary | ICD-10-CM

## 2014-08-27 DIAGNOSIS — Z1212 Encounter for screening for malignant neoplasm of rectum: Secondary | ICD-10-CM

## 2014-08-27 DIAGNOSIS — Z780 Asymptomatic menopausal state: Secondary | ICD-10-CM

## 2014-08-27 HISTORY — DX: Asymptomatic menopausal state: Z78.0

## 2014-08-27 LAB — HEMOCCULT GUIAC POC 1CARD (OFFICE): FECAL OCCULT BLD: NEGATIVE

## 2014-08-27 NOTE — Progress Notes (Signed)
Patient ID: Jo Hale, female   DOB: [birth year], 54 y.o.   MRN: [id number] History of Present Illness:  Jo Hale is a 54 year old black female in for well woman gyn exam, she had a normal pap 07/26/12  Current Medications, Allergies, Past Medical History, Past Surgical History, Family History and Social History were reviewed in [employer] record.     Review of Systems: Patient denies any daily headaches, hearing loss, fatigue, blurred vision, shortness of breath, chest pain, abdominal pain, problems with bowel movements, urination, or intercourse. No mood swings.Has pain in right hip again , will see Dr Aline Brochure again, and has a few hot flashes and has odor after sex.    Physical Exam:BP 140/80 mmHg  Pulse 72  Ht 5\' 2"  (1.575 m)  Wt 238 lb (107.956 kg)  BMI 43.52 kg/m2  LMP 02/17/2011  General:  Well developed, well nourished, no acute distress Skin:  Warm and dry Neck:  Midline trachea, normal thyroid, good ROM, no lymphadenopathy Lungs; Clear to auscultation bilaterally Breast:  No dominant palpable mass, retraction, or nipple discharge Cardiovascular: Regular rate and rhythm Abdomen:  Soft, non tender, no hepatosplenomegaly Pelvic:  External genitalia is normal in appearance, no lesions.  The vagina is normal in appearance. Urethra has no lesions or masses. The cervix is smooth.  Uterus is absent.  No adnexal masses or tenderness noted.Bladder is non tender, no masses felt. Rectal: Good sphincter tone, no polyps, or hemorrhoids felt.  Hemoccult negative. Extremities/musculoskeletal:  No swelling or varicosities noted, no clubbing or cyanosis Psych:  No mood changes, alert and cooperative,seems happy Had colonoscopy 2012.Discussed bodily changes with menopause.Get him to pull out and not ejaculate in vagina and try luvena to see if helps.Had l;abs in last year.  Impression: Well woman gyn exam no pap Menopause     Plan: Try luvena, call when has  odor may Rx metrogel Pap and physical in 1 year Mammogram yearly Colonoscopy per Dr Oneida Alar Review handout on menopause

## 2014-08-27 NOTE — Patient Instructions (Signed)
Pap and physical in 1 year Mammogram yearly Try Luvena  Colonoscopy per GI Menopause Menopause is the normal time of life when menstrual periods stop completely. Menopause is complete when you have missed 12 consecutive menstrual periods. It usually occurs between the ages of 47 years and 74 years. Very rarely does a woman develop menopause before the age of 85 years. At menopause, your ovaries stop producing the female hormones estrogen and progesterone. This can cause undesirable symptoms and also affect your health. Sometimes the symptoms may occur 4-5 years before the menopause begins. There is no relationship between menopause and:  Oral contraceptives.  Number of children you had.  Race.  The age your menstrual periods started (menarche). Heavy smokers and very thin women may develop menopause earlier in life. CAUSES  The ovaries stop producing the female hormones estrogen and progesterone.  Other causes include:  Surgery to remove both ovaries.  The ovaries stop functioning for no known reason.  Tumors of the pituitary gland in the brain.  Medical disease that affects the ovaries and hormone production.  Radiation treatment to the abdomen or pelvis.  Chemotherapy that affects the ovaries. SYMPTOMS   Hot flashes.  Night sweats.  Decrease in sex drive.  Vaginal dryness and thinning of the vagina causing painful intercourse.  Dryness of the skin and developing wrinkles.  Headaches.  Tiredness.  Irritability.  Memory problems.  Weight gain.  Bladder infections.  Hair growth of the face and chest.  Infertility. More serious symptoms include:  Loss of bone (osteoporosis) causing breaks (fractures).  Depression.  Hardening and narrowing of the arteries (atherosclerosis) causing heart attacks and strokes. DIAGNOSIS   When the menstrual periods have stopped for 12 straight months.  Physical exam.  Hormone studies of the blood. TREATMENT  There  are many treatment choices and nearly as many questions about them. The decisions to treat or not to treat menopausal changes is an individual choice made with your health care provider. Your health care provider can discuss the treatments with you. Together, you can decide which treatment will work best for you. Your treatment choices may include:   Hormone therapy (estrogen and progesterone).  Non-hormonal medicines.  Treating the individual symptoms with medicine (for example antidepressants for depression).  Herbal medicines that may help specific symptoms.  Counseling by a psychiatrist or psychologist.  Group therapy.  Lifestyle changes including:  Eating healthy.  Regular exercise.  Limiting caffeine and alcohol.  Stress management and meditation.  No treatment. HOME CARE INSTRUCTIONS   Take the medicine your health care provider gives you as directed.  Get plenty of sleep and rest.  Exercise regularly.  Eat a diet that contains calcium (good for the bones) and soy products (acts like estrogen hormone).  Avoid alcoholic beverages.  Do not smoke.  If you have hot flashes, dress in layers.  Take supplements, calcium, and vitamin D to strengthen bones.  You can use over-the-counter lubricants or moisturizers for vaginal dryness.  Group therapy is sometimes very helpful.  Acupuncture may be helpful in some cases. SEEK MEDICAL CARE IF:   You are not sure you are in menopause.  You are having menopausal symptoms and need advice and treatment.  You are still having menstrual periods after age 77 years.  You have pain with intercourse.  Menopause is complete (no menstrual period for 12 months) and you develop vaginal bleeding.  You need a referral to a specialist (gynecologist, psychiatrist, or psychologist) for treatment. [place]  IF:   You have severe depression.  You have excessive vaginal bleeding.  You fell and think you have a  broken bone.  You have pain when you urinate.  You develop leg or chest pain.  You have a fast pounding heart beat (palpitations).  You have severe headaches.  You develop vision problems.  You feel a lump in your breast.  You have abdominal pain or severe indigestion. Document Released: 06/19/2003 Document Revised: 11/29/2012 Document Reviewed: 10/26/2012 [facility] Patient Information 2015 [place], [place]. This information is not intended to replace advice given to you by your health care provider. Make sure you discuss any questions you have with your health care provider.

## 2014-09-13 ENCOUNTER — Encounter: Payer: Self-pay | Admitting: Nurse Practitioner

## 2014-09-13 ENCOUNTER — Ambulatory Visit (INDEPENDENT_AMBULATORY_CARE_PROVIDER_SITE_OTHER): Payer: BLUE CROSS/BLUE SHIELD | Admitting: Nurse Practitioner

## 2014-09-13 VITALS — BP 134/84 | Temp 98.4°F | Ht 62.0 in | Wt 244.5 lb

## 2014-09-13 DIAGNOSIS — J0120 Acute ethmoidal sinusitis, unspecified: Secondary | ICD-10-CM | POA: Diagnosis not present

## 2014-09-13 MED ORDER — AMOXICILLIN-POT CLAVULANATE 875-125 MG PO TABS: 1.0000 | ORAL_TABLET | Freq: Two times a day (BID) | ORAL | Status: DC

## 2014-09-13 MED ORDER — METHYLPREDNISOLONE ACETATE 40 MG/ML IJ SUSP
40.0000 mg | Freq: Once | INTRAMUSCULAR | Status: AC
  Administered 2014-09-13: 40 mg via INTRAMUSCULAR

## 2014-09-16 ENCOUNTER — Ambulatory Visit (INDEPENDENT_AMBULATORY_CARE_PROVIDER_SITE_OTHER): Payer: BLUE CROSS/BLUE SHIELD | Admitting: Orthopedic Surgery

## 2014-09-16 ENCOUNTER — Encounter: Payer: Self-pay | Admitting: Orthopedic Surgery

## 2014-09-16 ENCOUNTER — Encounter: Payer: Self-pay | Admitting: Nurse Practitioner

## 2014-09-16 VITALS — BP 152/83 | Ht 62.0 in | Wt 244.5 lb

## 2014-09-16 DIAGNOSIS — M5440 Lumbago with sciatica, unspecified side: Secondary | ICD-10-CM

## 2014-09-16 DIAGNOSIS — M5126 Other intervertebral disc displacement, lumbar region: Secondary | ICD-10-CM | POA: Diagnosis not present

## 2014-09-16 MED ORDER — METHOCARBAMOL 500 MG PO TABS: 500.0000 mg | ORAL_TABLET | Freq: Three times a day (TID) | ORAL | Status: DC

## 2014-09-16 NOTE — Patient Instructions (Signed)
We will schedule MRI for you and call you with appt and results  Continue ibuprofen  New med sent to your pharmacy (Robaxin)

## 2014-09-16 NOTE — Progress Notes (Signed)
Patient ID: Jo Hale, female   DOB: Sep 07, 1960, 54 y.o.   MRN: [id number] Progress note follow-up patient last seen June 2015 with pain in her buttocks running down the back of her right leg. She reported no trauma at that time but worsening pain we treated her with physical therapy, gabapentin. Her x-ray showed loss of lumbar lordosis but no acute process  Chief Complaint  Patient presents with  . Follow-up    1 year follow up, recurring right side back pain s/p pt    Review of systems bowel bladder function remains normal. Patient having night pain. Can't sleep wakes up in the middle of night with pain. She has taken ibuprofen and naproxen without relief  Past Medical History  Diagnosis Date  . GERD (gastroesophageal reflux disease)   . Hypertension   . Fibroids, intramural     450-600 gm uterus  . Abnormal pap   . Fibroids   . Posterior pain of right hip 07/31/2013  . Vaginal Pap smear, abnormal   . Menopause 08/27/2014   Exam findings are as follows  BP 152/83 mmHg  Ht 5\' 2"  (1.575 m)  Wt 244 lb 8 oz (110.904 kg)  BMI 44.71 kg/m2  LMP 02/17/2011 Appearance she has gained some weight her body habitus is endomorphic she has no gross developmental abnormalities  She is oriented 3 Mood is pleasant her affect is normal Ambulation is without assistive device or limp  Lumbar spine tenderness SI joint tenderness on the right buttock tenderness on the right mild tenderness posterior aspect right thigh right hip lateral aspect tenderness proximal to the greater trochanter in the abductor musculature  Leg lengths are equal hip range of motion is normal. There are no motor deficits in the lower extremities. Skin is intact pinwheel test is normal pulses are good in each leg.  She does exhibit painful flexion and positive straight leg raise on the right  Reflexes are 2+ at the knee 1+ at the ankle toes are downgoing   Encounter Diagnoses  Name Primary?  . Lumbar herniated  disc Yes  . Midline low back pain with sciatica, sciatica laterality unspecified      Recommend MRI lumbar spine. The patient will need epidural injections in the spine needs to be imaged prior to injection  Robaxin 500 mg 3 times a day continue ibuprofen

## 2014-09-16 NOTE — Progress Notes (Signed)
Subjective:  Presents for c/o sinus congestion x 3 d. On third day of Zpack. No fever. Slight sore throat. Ethmoid area headache. Head congestion. Occasional cough producing green sputum. Ear pain improved. No wheezing. Reflux controlled with Zantac.   Objective:   BP 134/84 mmHg  Temp(Src) 98.4 F (36.9 C) (Oral)  Ht 5\' 2"  (1.575 m)  Wt 244 lb 8 oz (110.904 kg)  BMI 44.71 kg/m2  LMP 02/17/2011 NAD. Alert, oriented. TMs clear effusion. Pharynx mild erythema with PND noted. Neck supple with mild anterior adenopathy. Lungs clear. Heart RRR.   Assessment: Acute ethmoidal sinusitis, recurrence not specified - Plan: methylPREDNISolone acetate (DEPO-MEDROL) injection 40 mg  Plan:  Meds ordered this encounter  Medications  . amoxicillin-clavulanate (AUGMENTIN) 875-125 MG per tablet    Sig: Take 1 tablet by mouth 2 (two) times daily.    Dispense:  20 tablet    Refill:  0    Order Specific Question:  Supervising Provider    Answer:  Mikey Kirschner [[id number]]  . methylPREDNISolone acetate (DEPO-MEDROL) injection 40 mg    Sig:    Stop Zpack. OTC meds as directed. Add steroid nasal spray to regimen.  Return if symptoms worsen or fail to improve.

## 2014-10-02 ENCOUNTER — Ambulatory Visit: Payer: BC Managed Care – PPO | Admitting: Family Medicine

## 2014-10-11 ENCOUNTER — Ambulatory Visit (HOSPITAL_COMMUNITY)
Admission: RE | Admit: 2014-10-11 | Discharge: 2014-10-11 | Disposition: A | Discharge status: Home / Self Care | Payer: BLUE CROSS/BLUE SHIELD | Source: Ambulatory Visit | Attending: Orthopedic Surgery | Admitting: Orthopedic Surgery

## 2014-10-11 DIAGNOSIS — M25551 Pain in right hip: Secondary | ICD-10-CM | POA: Insufficient documentation

## 2014-10-11 DIAGNOSIS — M1388 Other specified arthritis, other site: Secondary | ICD-10-CM | POA: Diagnosis not present

## 2014-10-11 DIAGNOSIS — M5126 Other intervertebral disc displacement, lumbar region: Secondary | ICD-10-CM

## 2014-10-16 ENCOUNTER — Other Ambulatory Visit: Payer: Self-pay | Admitting: *Deleted

## 2014-10-16 DIAGNOSIS — M47817 Spondylosis without myelopathy or radiculopathy, lumbosacral region: Secondary | ICD-10-CM

## 2014-10-24 ENCOUNTER — Ambulatory Visit
Admission: RE | Admit: 2014-10-24 | Discharge: 2014-10-24 | Disposition: A | Discharge status: Home / Self Care | Payer: BLUE CROSS/BLUE SHIELD | Source: Ambulatory Visit | Attending: Orthopedic Surgery | Admitting: Orthopedic Surgery

## 2014-10-24 VITALS — BP 150/74 | HR 66

## 2014-10-24 DIAGNOSIS — M79609 Pain in unspecified limb: Secondary | ICD-10-CM

## 2014-10-24 DIAGNOSIS — M47817 Spondylosis without myelopathy or radiculopathy, lumbosacral region: Secondary | ICD-10-CM

## 2014-10-24 MED ORDER — IOHEXOL 180 MG/ML  SOLN
1.0000 mL | Freq: Once | INTRAMUSCULAR | Status: AC | PRN
  Administered 2014-10-24: 1 mL via EPIDURAL

## 2014-10-24 MED ORDER — METHYLPREDNISOLONE ACETATE 40 MG/ML INJ SUSP (RADIOLOG
120.0000 mg | Freq: Once | INTRAMUSCULAR | Status: AC
  Administered 2014-10-24: 120 mg via EPIDURAL

## 2014-10-24 NOTE — Discharge Instructions (Signed)

## 2014-10-28 ENCOUNTER — Other Ambulatory Visit: Payer: Self-pay | Admitting: Family Medicine

## 2014-10-28 NOTE — Telephone Encounter (Signed)
Needs office visit.

## 2014-11-15 ENCOUNTER — Other Ambulatory Visit: Payer: Self-pay | Admitting: Family Medicine

## 2014-11-18 ENCOUNTER — Other Ambulatory Visit: Payer: Self-pay | Admitting: *Deleted

## 2014-11-18 ENCOUNTER — Telehealth: Payer: Self-pay | Admitting: Family Medicine

## 2014-11-18 MED ORDER — TRIAMTERENE-HCTZ 37.5-25 MG PO TABS: 1.0000 | ORAL_TABLET | Freq: Every day | ORAL | Status: DC

## 2014-11-18 NOTE — Telephone Encounter (Signed)
Med sent to pharm. Pt notified on voicemail.  

## 2014-11-18 NOTE — Telephone Encounter (Signed)
Pt is needing a refill on her bp meds pt has an appt scheduled [employer]

## 2014-12-04 ENCOUNTER — Ambulatory Visit (INDEPENDENT_AMBULATORY_CARE_PROVIDER_SITE_OTHER): Payer: BLUE CROSS/BLUE SHIELD | Admitting: Family Medicine

## 2014-12-04 ENCOUNTER — Encounter: Payer: Self-pay | Admitting: Family Medicine

## 2014-12-04 VITALS — BP 130/78 | Ht 62.0 in | Wt 236.2 lb

## 2014-12-04 DIAGNOSIS — J300 Vasomotor rhinitis: Secondary | ICD-10-CM

## 2014-12-04 DIAGNOSIS — K219 Gastro-esophageal reflux disease without esophagitis: Secondary | ICD-10-CM

## 2014-12-04 DIAGNOSIS — I10 Essential (primary) hypertension: Secondary | ICD-10-CM | POA: Diagnosis not present

## 2014-12-04 DIAGNOSIS — M5410 Radiculopathy, site unspecified: Secondary | ICD-10-CM

## 2014-12-04 MED ORDER — PANTOPRAZOLE SODIUM 40 MG PO TBEC: 40.0000 mg | DELAYED_RELEASE_TABLET | Freq: Every day | ORAL | Status: DC

## 2014-12-04 MED ORDER — GABAPENTIN 100 MG PO CAPS: 100.0000 mg | ORAL_CAPSULE | Freq: Three times a day (TID) | ORAL | Status: DC | PRN

## 2014-12-04 MED ORDER — TRIAMTERENE-HCTZ 37.5-25 MG PO TABS: 1.0000 | ORAL_TABLET | Freq: Every day | ORAL | Status: DC

## 2014-12-04 NOTE — Patient Instructions (Signed)
Start one daily otc loratadine 10 mg each eve

## 2014-12-04 NOTE — Progress Notes (Signed)
   Subjective:    Patient ID: Jo Hale, female    DOB: 09-Feb-1961, 54 y.o.   MRN: [id number]  Hypertension This is a chronic problem. The current episode started more than 1 year ago. Treatments tried: maxide. There are no compliance problems.     Also needs refill on zantac next  . Patient also concerned about fluid build up behind knees.  bp numbers oveall doing good  Injection helped some, ma have to go back  Swelling and knees bothering here  Takes fluid pills faithfully  Reflux stil an issue, bothering at times. States Zantac really is not helping as much as she hoped.  Drainage and cong in the throat, often gags her in the evening. Worse at night.  No allergy to pollen       Review of Systems No chest pain no back pain abdominal pain no change in bowel habits no blood in stool    Objective:   Physical Exam Alert vital stable blood pressure good on repeat HEENT sinus congestion. Some neck supple lungs clear. Heart rare rhythm knees bilateral crepitations left greater than right no palpable effusion ankles trace edema       Assessment & Plan:  Impression 1 hypertension good control #2 reflux suboptimal in discussed #3 arthritis knees with secondary symptoms discussed #4 perennial rhinitis plan medications refilled. Trial of Claritin. Diet exercise discussed. Recheck in 6 months blood work then [employer]

## 2014-12-26 ENCOUNTER — Encounter: Payer: Self-pay | Admitting: Family Medicine

## 2014-12-26 ENCOUNTER — Ambulatory Visit (INDEPENDENT_AMBULATORY_CARE_PROVIDER_SITE_OTHER): Payer: BLUE CROSS/BLUE SHIELD | Admitting: Family Medicine

## 2014-12-26 VITALS — BP 134/80 | Temp 98.3°F | Ht 62.0 in | Wt 246.4 lb

## 2014-12-26 DIAGNOSIS — J329 Chronic sinusitis, unspecified: Secondary | ICD-10-CM | POA: Diagnosis not present

## 2014-12-26 DIAGNOSIS — I878 Other specified disorders of veins: Secondary | ICD-10-CM | POA: Diagnosis not present

## 2014-12-26 DIAGNOSIS — J310 Chronic rhinitis: Secondary | ICD-10-CM

## 2014-12-26 DIAGNOSIS — I10 Essential (primary) hypertension: Secondary | ICD-10-CM

## 2014-12-26 MED ORDER — AMOXICILLIN-POT CLAVULANATE 875-125 MG PO TABS: ORAL_TABLET | ORAL | Status: DC

## 2014-12-26 NOTE — Progress Notes (Signed)
   Subjective:    Patient ID: Jo Hale, female    DOB: 09-Jun-1960, 54 y.o.   MRN: [id number] Patient arrives office with acute concerns, but also makes clear once to discuss several chronic concerns also Sinusitis This is a new problem. Associated symptoms include congestion, coughing, ear pain, headaches, sinus pressure and a sore throat. Past treatments include oral decongestants (Benadryl, ).   History of hypertension. Patient on medication for this. States that not sure things are going well since we've "change medicine". States she was on something before this diabetic. A review of records did not reveal this at all. The patient seems a bit on the same diabetic for nearly 4 years.  Patient also has c/o of pedal edema.. Considerable swelling. Worse after up all day. Concerning to patient. Wonders if it may be something more serious. Wonders if she can take a stronger diarrhetic to help get rid of it claims no excessive salt intake   Review of Systems  HENT: Positive for congestion, ear pain, sinus pressure and sore throat.   Respiratory: Positive for cough.   Neurological: Positive for headaches.       Objective:   Physical Exam  Alert vitals stable. Blood pressure good on repeat HEENT moderate nasal congestion frontal tenderness pharynx erythematous neck supple lungs clear heart regular rhythm ankles 1+ edema bilateral      Assessment & Plan:  Impression 1 hypertension good control discussed #2 rhinosinusitis discussed #3 venous stasis discussed at great length. Patient does have it. Not a sign of anything bad per se. It would be a mistake to put her on extremely strong diarrhetic's because that would put attacks on her kidney function Center long discussion held. Plan antibiotics prescribed. With patient wishes to hold off in different medicine. We called the pharmacy she's actually on a different brand of the exact same diarrhetic. Patient to maintain follow-up as scheduled  [facility]

## 2015-03-13 ENCOUNTER — Other Ambulatory Visit: Payer: Self-pay | Admitting: *Deleted

## 2015-03-13 MED ORDER — MOMETASONE FUROATE 0.1 % EX CREA: 1.0000 "application " | TOPICAL_CREAM | Freq: Two times a day (BID) | CUTANEOUS | Status: DC | PRN

## 2015-04-10 ENCOUNTER — Telehealth: Payer: Self-pay | Admitting: Orthopedic Surgery

## 2015-04-10 NOTE — Telephone Encounter (Signed)
Patient called to inquire about having another epiduaral injection, and previous order was from 10/24/14.  Please advise regarding having another injection - cell, best phone# [phone].

## 2015-04-10 NOTE — Telephone Encounter (Signed)
Original order good for 3 injections, order not 6 months old, should be fine for patient to call and schedule

## 2015-04-11 ENCOUNTER — Other Ambulatory Visit: Payer: Self-pay | Admitting: Orthopedic Surgery

## 2015-04-11 DIAGNOSIS — M545 Low back pain: Principal | ICD-10-CM

## 2015-04-11 DIAGNOSIS — G8929 Other chronic pain: Secondary | ICD-10-CM

## 2015-04-11 NOTE — Telephone Encounter (Signed)
Called back to patient; relayed.  °

## 2015-04-16 ENCOUNTER — Other Ambulatory Visit: Payer: Self-pay | Admitting: Neurology

## 2015-04-17 ENCOUNTER — Ambulatory Visit
Admission: RE | Admit: 2015-04-17 | Discharge: 2015-04-17 | Disposition: A | Discharge status: Home / Self Care | Payer: BLUE CROSS/BLUE SHIELD | Source: Ambulatory Visit | Attending: Orthopedic Surgery | Admitting: Orthopedic Surgery

## 2015-04-17 DIAGNOSIS — M545 Low back pain, unspecified: Secondary | ICD-10-CM

## 2015-04-17 DIAGNOSIS — G8929 Other chronic pain: Secondary | ICD-10-CM

## 2015-04-17 MED ORDER — METHYLPREDNISOLONE ACETATE 40 MG/ML INJ SUSP (RADIOLOG
120.0000 mg | Freq: Once | INTRAMUSCULAR | Status: AC
  Administered 2015-04-17: 120 mg via EPIDURAL

## 2015-04-17 MED ORDER — IOHEXOL 180 MG/ML  SOLN
1.0000 mL | Freq: Once | INTRAMUSCULAR | Status: AC | PRN
  Administered 2015-04-17: 1 mL via EPIDURAL

## 2015-04-17 NOTE — Discharge Instructions (Signed)

## 2015-05-14 ENCOUNTER — Other Ambulatory Visit: Payer: Self-pay | Admitting: Family Medicine

## 2015-05-14 DIAGNOSIS — Z1231 Encounter for screening mammogram for malignant neoplasm of breast: Secondary | ICD-10-CM

## 2015-05-15 ENCOUNTER — Ambulatory Visit (HOSPITAL_COMMUNITY)
Admission: RE | Admit: 2015-05-15 | Discharge: 2015-05-15 | Disposition: A | Discharge status: Home / Self Care | Payer: BLUE CROSS/BLUE SHIELD | Source: Ambulatory Visit | Attending: Family Medicine | Admitting: Family Medicine

## 2015-05-15 DIAGNOSIS — Z1231 Encounter for screening mammogram for malignant neoplasm of breast: Secondary | ICD-10-CM | POA: Diagnosis present

## 2015-06-20 ENCOUNTER — Ambulatory Visit (INDEPENDENT_AMBULATORY_CARE_PROVIDER_SITE_OTHER): Payer: BLUE CROSS/BLUE SHIELD | Admitting: Nurse Practitioner

## 2015-06-20 ENCOUNTER — Encounter: Payer: Self-pay | Admitting: Nurse Practitioner

## 2015-06-20 VITALS — BP 138/82 | Ht 62.0 in | Wt 237.8 lb

## 2015-06-20 DIAGNOSIS — R109 Unspecified abdominal pain: Secondary | ICD-10-CM

## 2015-06-20 DIAGNOSIS — K219 Gastro-esophageal reflux disease without esophagitis: Secondary | ICD-10-CM

## 2015-06-20 DIAGNOSIS — R21 Rash and other nonspecific skin eruption: Secondary | ICD-10-CM

## 2015-06-20 LAB — POCT URINALYSIS DIPSTICK
PH UA: 6
Spec Grav, UA: 1.015

## 2015-06-20 MED ORDER — PANTOPRAZOLE SODIUM 40 MG PO TBEC: 40.0000 mg | DELAYED_RELEASE_TABLET | Freq: Every day | ORAL | Status: DC

## 2015-06-20 NOTE — Patient Instructions (Signed)
Gabapentin 100mg  take one each night for 3 nights then 2 each night for 3 nights then 3 each night

## 2015-06-20 NOTE — Progress Notes (Signed)
Subjective:  Presents for complaints of pain in the right mid chest wall going underneath the right breast to midline off-and-on for the past several months. Occurs 1-2 times per week lasting 3-5 minutes at a time. No rash. No nausea vomiting abdominal pain. Unassociated with food or meals. No urinary symptoms. Had a contusion in the mid lower sternum in 2015, no injury to this area. No fever or cough. Has not identified any specific triggers. Does have a history of right lumbar pain, has received injections. Also has had a slight flare up of her reflux symptoms lately, does not take her Protonix daily. Needs a refill. Had her breast exam and mammogram in January both were normal. Was prescribed gabapentin for her back pain, is no longer taking this. States she did not see much of a difference. Also has a chronic facial rash, saw Dr. [place] Crane for this several years ago continues to be an issue. Would like to see another provider.  Objective:   BP 138/82 mmHg  Ht 5\' 2"  (1.575 m)  Wt 237 lb 12.8 oz (107.865 kg)  BMI 43.48 kg/m2  LMP 02/17/2011 NAD. Alert, oriented. Lungs clear. Heart regular rate rhythm. No CVA tenderness. Minimal tenderness to palpation along the anterior axillary line to beneath the right breast. Skin is clear. Abdomen soft nondistended with mild epigastric area tenderness, otherwise benign. Results for orders placed or performed in visit on 06/20/15  POCT urinalysis dipstick  Result Value Ref Range   Color, UA     Clarity, UA     Glucose, UA     Bilirubin, UA     Ketones, UA     Spec Grav, UA 1.015    Blood, UA     pH, UA 6.0    Protein, UA     Urobilinogen, UA     Nitrite, UA     Leukocytes, UA  Negative     Assessment: Right flank pain - Plan: POCT urinalysis dipstick Most likely neuropathic pain along a dermatomal line Facial rash chronic  GERD  Plan:  Meds ordered this encounter  Medications  . pantoprazole (PROTONIX) 40 MG tablet    Sig: Take 1 tablet (40 mg  total) by mouth daily.    Dispense:  30 tablet    Refill:  5    Order Specific Question:  Supervising Provider    Answer:  Mikey Kirschner [[id number]]   Patient to check to see if she has been gabapentin 100 mg at home. If so restart Gabapentin 100mg  take one each night for 3 nights then 2 each night for 3 nights then 3 each night. If she no longer has med, contact the office so we can prescribe 300 mg dose. Restart Protonix daily for a couple of weeks and if symptoms have resolved go back to when necessary dosing. Refer to dermatology for chronic facial rash. Warning signs reviewed. Recheck if symptoms worsen or persist.

## 2015-06-24 ENCOUNTER — Encounter: Payer: Self-pay | Admitting: Family Medicine

## 2015-07-02 ENCOUNTER — Emergency Department (HOSPITAL_COMMUNITY): Payer: 59

## 2015-07-02 ENCOUNTER — Telehealth: Payer: Self-pay | Admitting: Family Medicine

## 2015-07-02 ENCOUNTER — Encounter (HOSPITAL_COMMUNITY): Payer: Self-pay | Admitting: Emergency Medicine

## 2015-07-02 ENCOUNTER — Telehealth: Payer: Self-pay | Admitting: *Deleted

## 2015-07-02 ENCOUNTER — Observation Stay (HOSPITAL_COMMUNITY)
Admission: EM | Admit: 2015-07-02 | Discharge: 2015-07-03 | Disposition: A | Discharge status: Home / Self Care | Payer: 59 | Attending: Internal Medicine | Admitting: Internal Medicine

## 2015-07-02 DIAGNOSIS — Z79899 Other long term (current) drug therapy: Secondary | ICD-10-CM | POA: Diagnosis not present

## 2015-07-02 DIAGNOSIS — M791 Myalgia: Secondary | ICD-10-CM | POA: Insufficient documentation

## 2015-07-02 DIAGNOSIS — R21 Rash and other nonspecific skin eruption: Secondary | ICD-10-CM | POA: Diagnosis not present

## 2015-07-02 DIAGNOSIS — R05 Cough: Secondary | ICD-10-CM | POA: Insufficient documentation

## 2015-07-02 DIAGNOSIS — I639 Cerebral infarction, unspecified: Secondary | ICD-10-CM | POA: Diagnosis not present

## 2015-07-02 DIAGNOSIS — I6309 Cerebral infarction due to thrombosis of other precerebral artery: Secondary | ICD-10-CM

## 2015-07-02 DIAGNOSIS — Z791 Long term (current) use of non-steroidal anti-inflammatories (NSAID): Secondary | ICD-10-CM | POA: Insufficient documentation

## 2015-07-02 DIAGNOSIS — R200 Anesthesia of skin: Secondary | ICD-10-CM | POA: Diagnosis present

## 2015-07-02 DIAGNOSIS — M549 Dorsalgia, unspecified: Secondary | ICD-10-CM | POA: Diagnosis not present

## 2015-07-02 DIAGNOSIS — I10 Essential (primary) hypertension: Secondary | ICD-10-CM | POA: Diagnosis not present

## 2015-07-02 LAB — DIFFERENTIAL
BASOS PCT: 1 %
Basophils Absolute: 0 10*3/uL (ref 0.0–0.1)
EOS PCT: 3 %
Eosinophils Absolute: 0.1 10*3/uL (ref 0.0–0.7)
Lymphocytes Relative: 32 %
Lymphs Abs: 1.3 10*3/uL (ref 0.7–4.0)
MONO ABS: 0.6 10*3/uL (ref 0.1–1.0)
Monocytes Relative: 13 %
NEUTROS ABS: 2.2 10*3/uL (ref 1.7–7.7)
Neutrophils Relative %: 51 %

## 2015-07-02 LAB — APTT: aPTT: 28 seconds (ref 24–37)

## 2015-07-02 LAB — CBC
HCT: 39.5 % (ref 36.0–46.0)
Hemoglobin: 13.3 g/dL (ref 12.0–15.0)
MCH: 31.8 pg (ref 26.0–34.0)
MCHC: 33.7 g/dL (ref 30.0–36.0)
MCV: 94.5 fL (ref 78.0–100.0)
PLATELETS: 255 10*3/uL (ref 150–400)
RBC: 4.18 MIL/uL (ref 3.87–5.11)
RDW: 13.4 % (ref 11.5–15.5)
WBC: 4.2 10*3/uL (ref 4.0–10.5)

## 2015-07-02 LAB — COMPREHENSIVE METABOLIC PANEL
ALBUMIN: 3.9 g/dL (ref 3.5–5.0)
ALK PHOS: 78 U/L (ref 38–126)
ALT: 25 U/L (ref 14–54)
AST: 20 U/L (ref 15–41)
Anion gap: 9 (ref 5–15)
BUN: 15 mg/dL (ref 6–20)
CALCIUM: 8.7 mg/dL — AB (ref 8.9–10.3)
CHLORIDE: 105 mmol/L (ref 101–111)
CO2: 27 mmol/L (ref 22–32)
CREATININE: 1.13 mg/dL — AB (ref 0.44–1.00)
GFR calc non Af Amer: 54 mL/min — ABNORMAL LOW (ref >60)
GLUCOSE: 101 mg/dL — AB (ref 65–99)
Potassium: 3.3 mmol/L — ABNORMAL LOW (ref 3.5–5.1)
SODIUM: 141 mmol/L (ref 135–145)
Total Bilirubin: 0.7 mg/dL (ref 0.3–1.2)
Total Protein: 7.3 g/dL (ref 6.5–8.1)

## 2015-07-02 LAB — I-STAT TROPONIN, ED: Troponin i, poc: 0 ng/mL (ref 0.00–0.08)

## 2015-07-02 LAB — MAGNESIUM: Magnesium: 2 mg/dL (ref 1.7–2.4)

## 2015-07-02 LAB — PROTIME-INR
INR: 1.07 (ref 0.00–1.49)
PROTHROMBIN TIME: 14.1 s (ref 11.6–15.2)

## 2015-07-02 MED ORDER — SENNOSIDES-DOCUSATE SODIUM 8.6-50 MG PO TABS: 1.0000 | ORAL_TABLET | Freq: Every evening | ORAL | Status: DC | PRN

## 2015-07-02 MED ORDER — GABAPENTIN 100 MG PO CAPS
100.0000 mg | ORAL_CAPSULE | Freq: Three times a day (TID) | ORAL | Status: DC
  Administered 2015-07-02 – 2015-07-03 (×3): 100 mg via ORAL
  Filled 2015-07-02 (×3): qty 1

## 2015-07-02 MED ORDER — STROKE: EARLY STAGES OF RECOVERY BOOK
Freq: Once | Status: AC
  Administered 2015-07-03: 10:00:00
  Filled 2015-07-02: qty 1

## 2015-07-02 MED ORDER — ASPIRIN 325 MG PO TABS
325.0000 mg | ORAL_TABLET | Freq: Every day | ORAL | Status: DC
  Administered 2015-07-02 – 2015-07-03 (×2): 325 mg via ORAL
  Filled 2015-07-02 (×2): qty 1

## 2015-07-02 MED ORDER — POTASSIUM CHLORIDE CRYS ER 20 MEQ PO TBCR
40.0000 meq | EXTENDED_RELEASE_TABLET | Freq: Once | ORAL | Status: AC
  Administered 2015-07-02: 40 meq via ORAL
  Filled 2015-07-02: qty 2

## 2015-07-02 MED ORDER — TRIAMTERENE-HCTZ 37.5-25 MG PO TABS
1.0000 | ORAL_TABLET | Freq: Every day | ORAL | Status: DC
  Administered 2015-07-02 – 2015-07-03 (×2): 1 via ORAL
  Filled 2015-07-02 (×2): qty 1

## 2015-07-02 MED ORDER — ASPIRIN 300 MG RE SUPP: 300.0000 mg | Freq: Every day | RECTAL | Status: DC

## 2015-07-02 MED ORDER — PANTOPRAZOLE SODIUM 40 MG PO TBEC
40.0000 mg | DELAYED_RELEASE_TABLET | Freq: Every day | ORAL | Status: DC
  Administered 2015-07-02 – 2015-07-03 (×2): 40 mg via ORAL
  Filled 2015-07-02 (×2): qty 1

## 2015-07-02 MED ORDER — ENOXAPARIN SODIUM 40 MG/0.4ML ~~LOC~~ SOLN
40.0000 mg | SUBCUTANEOUS | Status: DC
  Administered 2015-07-02: 40 mg via SUBCUTANEOUS
  Filled 2015-07-02: qty 0.4

## 2015-07-02 NOTE — Telephone Encounter (Signed)
Patient walked in with c/o numbness on the right side of her face-including her tongue(patient states it feels like when you are numbed at dentist). Patient states it went away last night but returned this morning and now she is also having tingling and numbness in her right hand. Consult  With Dr Lilla Shook advises that patient go directly to ER-symptoms concerning for a possible stroke or TIA. Discussed with patient-patient verbalized understanidng and was heading to the ER for assessment.

## 2015-07-02 NOTE — Telephone Encounter (Signed)
good

## 2015-07-02 NOTE — H&P (Signed)
[facility]          History and Physical    PCP:   Mickie Hillier, MD   EDP: Ria Comment, M.D.  Chief Complaint:  Right facial numbness  HPI: Patient is a pleasant 55 year old woman with past medical history only significant for hypertension who describes an episode of right sided mouth numbness that began yesterday at around 6:30 PM. This resolved after 40 minutes and she didn't think much about it. This morning she again noticed this right mouth numbness that began at around 7:30 AM. She states it feels as if she had gone to the dentist to where her lips and right side of her mouth were numb. She mentioned this to a coworker who urged her to seek medical attention. She went to see Dr. Wolfgang Phoenix and was advised to come to the emergency department for evaluation. In the ED her lab work has essentially been within normal limits with the exception of a slightly low potassium at 3.3, CT scan of the head showed no acute abnormalities, MRI showed a small acute lacunar infarct in the dorsal left thalamus with no associated hemorrhage or mass effect. We were asked to admit her for further evaluation and management.  Allergies:   Allergies  Allergen Reactions  . Adhesive [Tape] Rash      Past Medical History  Diagnosis Date  . GERD (gastroesophageal reflux disease)   . Hypertension   . Fibroids, intramural     450-600 gm uterus  . Abnormal pap   . Fibroids   . Posterior pain of right hip 07/31/2013  . Vaginal Pap smear, abnormal   . Menopause 08/27/2014    Past Surgical History  Procedure Laterality Date  . Cesarean section    . Tubal ligation    . Colonoscopy  02/19/2011    Fields-incomplete exam, otherwise normal   . Laparoscopic supracervical hysterectomy  03/02/2011    Procedure: LAPAROSCOPIC SUPRACERVICAL HYSTERECTOMY;  Surgeon: Jonnie Kind, MD;  Location: [facility];  Service: Gynecology;  Laterality: N/A;  . Partial hysterectomy    . Rotator cuff  repair      left    Prior to Admission medications   Medication Sig Start Date End Date Taking? Authorizing Provider  gabapentin (NEURONTIN) 100 MG capsule Take 1 capsule (100 mg total) by mouth every 8 (eight) hours as needed. 12/04/14  Yes Mikey Kirschner, MD  ibuprofen (ADVIL,MOTRIN) 200 MG tablet Take 200 mg by mouth as needed.   Yes Historical Provider, MD  LOCOID LIPOCREAM 0.1 % CREA APPLY TO AFFECTED AREA TWICE DAILY AS NEEDED. Patient taking differently: daily 11/27/13  Yes Mikey Kirschner, MD  mometasone (ELOCON) 0.1 % cream Apply 1 application topically 2 (two) times daily as needed. 03/13/15  Yes Kathyrn Drown, MD  pantoprazole (PROTONIX) 40 MG tablet Take 1 tablet (40 mg total) by mouth daily. 06/20/15 06/19/16 Yes Nilda Simmer, NP  triamterene-hydrochlorothiazide (MAXZIDE-25) 37.5-25 MG per tablet Take 1 tablet by mouth daily. 12/04/14  Yes Mikey Kirschner, MD    Social History:  reports that she has never smoked. She has never used smokeless tobacco. She reports that she does not drink alcohol or use illicit drugs.  Family History  Problem Relation Age of Onset  . Colon cancer Neg Hx   . Heart disease Mother   . Dementia Mother   . Congestive Heart Failure Father   .  Mental illness Brother   . Early death Sister   . Alcohol abuse Sister   . Stroke Sister   . Mental illness Brother   . Mental illness Brother     Review of Systems:  Constitutional: Denies fever, chills, diaphoresis, appetite change and fatigue.  HEENT: Denies photophobia, eye pain, redness, hearing loss, ear pain, congestion, sore throat, rhinorrhea, sneezing, mouth sores, trouble swallowing, neck pain, neck stiffness and tinnitus.   Respiratory: Denies SOB, DOE, cough, chest tightness,  and wheezing.   Cardiovascular: Denies chest pain, palpitations and leg swelling.  Gastrointestinal: Denies nausea, vomiting, abdominal pain, diarrhea, constipation, blood in stool and abdominal distention.    Genitourinary: Denies dysuria, urgency, frequency, hematuria, flank pain and difficulty urinating.  Endocrine: Denies: hot or cold intolerance, sweats, changes in hair or nails, polyuria, polydipsia. Musculoskeletal: Denies myalgias, back pain, joint swelling, arthralgias and gait problem.  Skin: Denies pallor, rash and wound.  Neurological: Denies dizziness, seizures, syncope, weakness, light-headedness, numbness and headaches.  Hematological: Denies adenopathy. Easy bruising, personal or family bleeding history  Psychiatric/Behavioral: Denies suicidal ideation, mood changes, confusion, nervousness, sleep disturbance and agitation   Physical Exam: Blood pressure 155/75, pulse 70, temperature 98.1 F (36.7 C), temperature source Oral, resp. rate 20, height 5' 2"  (1.575 m), weight 99.791 kg (220 lb), last menstrual period 02/17/2011, SpO2 99 %. General: Alert, awake, oriented 3, no current distress HEENT: Normocephalic, atraumatic, pupils equal round and reactive to light, extraocular movements intact, moist mucous membranes Neck: Supple, no JVD, no lymphadenopathy, no bruits, no goiter. Cardiovascular: Regular rate and rhythm, no murmurs, rubs or gallops Lungs: Clear to auscultation bilaterally Abdomen: Soft, nontender, nondistended, positive bowel sounds, no masses or organomegaly noted Extremities: No clubbing, cyanosis or edema, positive pulses Neurologic: Grossly intact and nonfocal, numbness has resolved  Labs on Admission:  Results for orders placed or performed during the hospital encounter of 07/02/15 (from the past 48 hour(s))  Protime-INR     Status: None   Collection Time: 07/02/15  9:55 AM  Result Value Ref Range   Prothrombin Time 14.1 11.6 - 15.2 seconds   INR 1.07 0.00 - 1.49  APTT     Status: None   Collection Time: 07/02/15  9:55 AM  Result Value Ref Range   aPTT 28 24 - 37 seconds  CBC     Status: None   Collection Time: 07/02/15  9:55 AM  Result Value Ref Range    WBC 4.2 4.0 - 10.5 K/uL   RBC 4.18 3.87 - 5.11 MIL/uL   Hemoglobin 13.3 12.0 - 15.0 g/dL   HCT 39.5 36.0 - 46.0 %   MCV 94.5 78.0 - 100.0 fL   MCH 31.8 26.0 - 34.0 pg   MCHC 33.7 30.0 - 36.0 g/dL   RDW 13.4 11.5 - 15.5 %   Platelets 255 150 - 400 K/uL  Differential     Status: None   Collection Time: 07/02/15  9:55 AM  Result Value Ref Range   Neutrophils Relative % 51 %   Neutro Abs 2.2 1.7 - 7.7 K/uL   Lymphocytes Relative 32 %   Lymphs Abs 1.3 0.7 - 4.0 K/uL   Monocytes Relative 13 %   Monocytes Absolute 0.6 0.1 - 1.0 K/uL   Eosinophils Relative 3 %   Eosinophils Absolute 0.1 0.0 - 0.7 K/uL   Basophils Relative 1 %   Basophils Absolute 0.0 0.0 - 0.1 K/uL  Comprehensive metabolic panel     Status: Abnormal   Collection  Time: 07/02/15  9:55 AM  Result Value Ref Range   Sodium 141 135 - 145 mmol/L   Potassium 3.3 (L) 3.5 - 5.1 mmol/L   Chloride 105 101 - 111 mmol/L   CO2 27 22 - 32 mmol/L   Glucose, Bld 101 (H) 65 - 99 mg/dL   BUN 15 6 - 20 mg/dL   Creatinine, Ser 1.13 (H) 0.44 - 1.00 mg/dL   Calcium 8.7 (L) 8.9 - 10.3 mg/dL   Total Protein 7.3 6.5 - 8.1 g/dL   Albumin 3.9 3.5 - 5.0 g/dL   AST 20 15 - 41 U/L   ALT 25 14 - 54 U/L   Alkaline Phosphatase 78 38 - 126 U/L   Total Bilirubin 0.7 0.3 - 1.2 mg/dL   GFR calc non Af Amer 54 (L) >60 mL/min   GFR calc Af Amer >60 >60 mL/min    Comment: (NOTE) The eGFR has been calculated using the CKD EPI equation. This calculation has not been validated in all clinical situations. eGFR's persistently <60 mL/min signify possible Chronic Kidney Disease.    Anion gap 9 5 - 15  I-stat troponin, ED (not at [facility], [facility])     Status: None   Collection Time: 07/02/15 10:02 AM  Result Value Ref Range   Troponin i, poc 0.00 0.00 - 0.08 ng/mL   Comment 3            Comment: Due to the release kinetics of cTnI, a negative result within the first hours of the onset of symptoms does not rule out myocardial infarction with certainty. If  myocardial infarction is still suspected, repeat the test at appropriate intervals.     Radiological Exams on Admission: Ct Head Wo Contrast  07/02/2015  CLINICAL DATA:  Facial numbness EXAM: CT HEAD WITHOUT CONTRAST TECHNIQUE: Contiguous axial images were obtained from the base of the skull through the vertex without intravenous contrast. COMPARISON:  None. FINDINGS: Bony calvarium is intact. No gross soft tissue abnormality is noted. No findings to suggest acute hemorrhage, acute infarction or space-occupying mass lesion are noted. IMPRESSION: No acute intracranial abnormality noted. Electronically Signed   By: Inez Catalina M.D.   On: 07/02/2015 10:42   Mr Brain Wo Contrast  07/02/2015  CLINICAL DATA:  55 year old female with right facial numbness intermittently since yesterday. Initial encounter. EXAM: MRI HEAD WITHOUT CONTRAST TECHNIQUE: Multiplanar, multiecho pulse sequences of the brain and surrounding structures were obtained without intravenous contrast. COMPARISON:  Head CT without contrast 1029 hours today. FINDINGS: 8 mm focus of restricted diffusion in the dorsal medial left thalamus best seen on series 101, image 23. Minimal associated T2 and FLAIR hyperintensity. No associated hemorrhage or mass effect. No other restricted diffusion. Major intracranial vascular flow voids are preserved. Outside of the acute findings, normal gray and white matter signal throughout the brain. No encephalomalacia or chronic cerebral blood products identified. No midline shift, mass effect, evidence of mass lesion, ventriculomegaly, extra-axial collection or acute intracranial hemorrhage. Cervicomedullary junction and pituitary are within normal limits. Visible internal auditory structures appear normal. Mastoids are clear. Trace ethmoid sinus mucosal thickening. Negative orbit and scalp soft tissues. Negative visualized cervical spine. IMPRESSION: 1. Small acute lacunar infarct in the dorsal left thalamus. No  associated hemorrhage or mass effect. 2. Otherwise normal noncontrast MRI appearance of the brain. Electronically Signed   By: Genevie Ann M.D.   On: 07/02/2015 12:03    Assessment/Plan Principal Problem:   CVA (cerebral infarction) Active Problems:  Essential hypertension, benign    Acute CVA -Admit as observation to a telemetry unit to monitor for arrhythmias. -Start aspirin for secondary stroke prevention. -Initiate stroke workup including lipids, 2-D echo, carotid Dopplers. -Given she is young and does not have much in the way of risk factors for stroke other than her hypertension, will request neurology evaluation to see if any further workup, such as vasculitis workup is indicated.  Hypertension -Currently well controlled, continue home medications.  Hypokalemia -Replete orally, check magnesium level.  DVT prophylaxis -Lovenox  CODE STATUS -Full code   Time Spent on Admission: 75 minutes  HERNANDEZ ACOSTA,ESTELA [facility] Pager: [phone] 07/02/2015, 6:12 PM

## 2015-07-02 NOTE — ED Notes (Addendum)
Pt had facial numbness at 1800 on yesterday 07/01/15, numbness subsided yesterday.  Had facial numbness again this at 0730 with tingling to right thumb and index finger, but subsided at 0815.  Currently no numbness at this time.  Denies any pain of discomfort at this time.  Dr Rogene Houston in to assess pt.

## 2015-07-02 NOTE — ED Notes (Signed)
C/o right sided facial numbness yesterday that went away.  Numbness came back this morning.  States it is starting to get better now.

## 2015-07-02 NOTE — ED Provider Notes (Signed)
CSN: [id number]     Arrival date & time 07/02/15  [id number] History  By signing my name below, I, Dora Sims, attest that this documentation has been prepared under the direction and in the presence of physician practitioner, Fredia Sorrow, MD,. Electronically Signed: Dora Sims, Scribe. 07/02/2015. 10:02 AM.    Chief Complaint  Patient presents with  . facial numbness     The history is provided by the patient. No language interpreter was used.     HPI Comments: Jo Hale is a 55 y.o. female with h/o of GERD and HTN who presents to the Emergency Department complaining of sudden onset, intermittent, inner right mouth numbness since yesterday around 6PM. Pt notes that her inner right mouth numbness lasts for around 30 minutes yesterday. She also notes intermittent right index finger and right thumb numbness in association with her mouth numbness. 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 Pt told her coordinator and she told her to get her symptoms checked out. Pt initially went to Dr. Lance Sell office around [facility] and by then her numbness had worn off significantly. She notes that her numbness completely resolved at 930AM upon arrival to the ER and has not presented since. She also notes a facial rash for several years. She endorses associated vision changes with onset of numbness. She denies fever, chills, dysuria, or any other associated symptoms.  Past Medical History  Diagnosis Date  . GERD (gastroesophageal reflux disease)   . Hypertension   . Fibroids, intramural     450-600 gm uterus  . Abnormal pap   . Fibroids   . Posterior pain of right hip 07/31/2013  . Vaginal Pap smear, abnormal   . Menopause 08/27/2014   Past Surgical History  Procedure Laterality Date  . Cesarean section    . Tubal ligation    . Colonoscopy  02/19/2011     Fields-incomplete exam, otherwise normal   . Laparoscopic supracervical hysterectomy  03/02/2011    Procedure: LAPAROSCOPIC SUPRACERVICAL HYSTERECTOMY;  Surgeon: Jonnie Kind, MD;  Location: [facility];  Service: Gynecology;  Laterality: N/A;  . Partial hysterectomy    . Rotator cuff repair      left   Family History  Problem Relation Age of Onset  . Colon cancer Neg Hx   . Heart disease Mother   . Dementia Mother   . Congestive Heart Failure Father   . Mental illness Brother   . Early death Sister   . Alcohol abuse Sister   . Stroke Sister   . Mental illness Brother   . Mental illness Brother    Social History  Substance Use Topics  . Smoking status: Never Smoker   . Smokeless tobacco: Never Used  . Alcohol Use: No   OB History    Gravida Para Term Preterm AB TAB SAB Ectopic Multiple Living   4 2   2           Review of Systems  Constitutional: Negative for fever, chills and fatigue.  HENT: Negative for rhinorrhea and sore throat.   Eyes: Positive for visual disturbance.  Respiratory: Positive for cough. Negative for shortness of breath.   Cardiovascular: Negative for chest pain.  Gastrointestinal: Negative for nausea, vomiting, abdominal pain and diarrhea.  Genitourinary: Negative for dysuria.  Musculoskeletal: Positive for back pain and arthralgias (right hip). Negative for myalgias and  joint swelling.  Skin: Positive for rash.  Neurological: Positive for numbness. Negative for dizziness, weakness, light-headedness and headaches.  Hematological: Does not bruise/bleed easily.  Psychiatric/Behavioral: Negative for confusion.      Allergies  Adhesive  Home Medications   Prior to Admission medications   Medication Sig Start Date End Date Taking? Authorizing Provider  gabapentin (NEURONTIN) 100 MG capsule Take 1 capsule (100 mg total) by mouth every 8 (eight) hours as needed. 12/04/14  Yes Mikey Kirschner, MD  ibuprofen (ADVIL,MOTRIN) 200 MG tablet Take 200 mg by  mouth as needed.   Yes Historical Provider, MD  LOCOID LIPOCREAM 0.1 % CREA APPLY TO AFFECTED AREA TWICE DAILY AS NEEDED. Patient taking differently: daily 11/27/13  Yes Mikey Kirschner, MD  mometasone (ELOCON) 0.1 % cream Apply 1 application topically 2 (two) times daily as needed. 03/13/15  Yes Kathyrn Drown, MD  pantoprazole (PROTONIX) 40 MG tablet Take 1 tablet (40 mg total) by mouth daily. 06/20/15 06/19/16 Yes Nilda Simmer, NP  triamterene-hydrochlorothiazide (MAXZIDE-25) 37.5-25 MG per tablet Take 1 tablet by mouth daily. 12/04/14  Yes Mikey Kirschner, MD   BP 118/99 mmHg  Pulse 67  Temp(Src) 97.5 F (36.4 C) (Oral)  Resp 12  SpO2 95%  LMP 02/17/2011 Physical Exam  Constitutional: She is oriented to person, place, and time. She appears well-developed and well-nourished. No distress.  HENT:  Head: Normocephalic and atraumatic.  Mouth/Throat: Oropharynx is clear and moist and mucous membranes are normal.  Eyes: Conjunctivae and EOM are normal. Pupils are equal, round, and reactive to light.  Neck: Neck supple. No tracheal deviation present.  Cardiovascular: Normal rate, regular rhythm and normal heart sounds.   Pulmonary/Chest: Effort normal and breath sounds normal. No respiratory distress.  Abdominal: Bowel sounds are normal. There is no tenderness.  Musculoskeletal: Normal range of motion.  Neurological: She is alert and oriented to person, place, and time. She has normal reflexes. She displays normal reflexes. She exhibits normal muscle tone. Coordination normal.  Skin: Skin is warm and dry.  Psychiatric: She has a normal mood and affect. Her behavior is normal.  Nursing note and vitals reviewed.   ED Course  Procedures (including critical care time)  DIAGNOSTIC STUDIES: Oxygen Saturation is 100% on RA, normal by my interpretation.    COORDINATION OF CARE: 10:02 AM Will order MR Brain w/ Contrast and CT Head w/o Contrast. Will order blood work. Will order cardiac  monitoring and ED EKG. Discussed treatment plan with pt at bedside and pt agreed to plan.  Medications - No data to display  Results for orders placed or performed during the hospital encounter of 07/02/15  Protime-INR  Result Value Ref Range   Prothrombin Time 14.1 11.6 - 15.2 seconds   INR 1.07 0.00 - 1.49  APTT  Result Value Ref Range   aPTT 28 24 - 37 seconds  CBC  Result Value Ref Range   WBC 4.2 4.0 - 10.5 K/uL   RBC 4.18 3.87 - 5.11 MIL/uL   Hemoglobin 13.3 12.0 - 15.0 g/dL   HCT 39.5 36.0 - 46.0 %   MCV 94.5 78.0 - 100.0 fL   MCH 31.8 26.0 - 34.0 pg   MCHC 33.7 30.0 - 36.0 g/dL   RDW 13.4 11.5 - 15.5 %   Platelets 255 150 - 400 K/uL  Differential  Result Value Ref Range   Neutrophils Relative % 51 %   Neutro Abs 2.2 1.7 - 7.7 K/uL   Lymphocytes  Relative 32 %   Lymphs Abs 1.3 0.7 - 4.0 K/uL   Monocytes Relative 13 %   Monocytes Absolute 0.6 0.1 - 1.0 K/uL   Eosinophils Relative 3 %   Eosinophils Absolute 0.1 0.0 - 0.7 K/uL   Basophils Relative 1 %   Basophils Absolute 0.0 0.0 - 0.1 K/uL  Comprehensive metabolic panel  Result Value Ref Range   Sodium 141 135 - 145 mmol/L   Potassium 3.3 (L) 3.5 - 5.1 mmol/L   Chloride 105 101 - 111 mmol/L   CO2 27 22 - 32 mmol/L   Glucose, Bld 101 (H) 65 - 99 mg/dL   BUN 15 6 - 20 mg/dL   Creatinine, Ser 1.13 (H) 0.44 - 1.00 mg/dL   Calcium 8.7 (L) 8.9 - 10.3 mg/dL   Total Protein 7.3 6.5 - 8.1 g/dL   Albumin 3.9 3.5 - 5.0 g/dL   AST 20 15 - 41 U/L   ALT 25 14 - 54 U/L   Alkaline Phosphatase 78 38 - 126 U/L   Total Bilirubin 0.7 0.3 - 1.2 mg/dL   GFR calc non Af Amer 54 (L) >60 mL/min   GFR calc Af Amer >60 >60 mL/min   Anion gap 9 5 - 15  I-stat troponin, ED (not at [facility], [facility])  Result Value Ref Range   Troponin i, poc 0.00 0.00 - 0.08 ng/mL   Comment 3           Ct Head Wo Contrast  07/02/2015  CLINICAL DATA:  Facial numbness EXAM: CT HEAD WITHOUT CONTRAST TECHNIQUE: Contiguous axial images were obtained from the  base of the skull through the vertex without intravenous contrast. COMPARISON:  None. FINDINGS: Bony calvarium is intact. No gross soft tissue abnormality is noted. No findings to suggest acute hemorrhage, acute infarction or space-occupying mass lesion are noted. IMPRESSION: No acute intracranial abnormality noted. Electronically Signed   By: Inez Catalina M.D.   On: 07/02/2015 10:42   Mr Brain Wo Contrast  07/02/2015  CLINICAL DATA:  55 year old female with right facial numbness intermittently since yesterday. Initial encounter. EXAM: MRI HEAD WITHOUT CONTRAST TECHNIQUE: Multiplanar, multiecho pulse sequences of the brain and surrounding structures were obtained without intravenous contrast. COMPARISON:  Head CT without contrast 1029 hours today. FINDINGS: 8 mm focus of restricted diffusion in the dorsal medial left thalamus best seen on series 101, image 23. Minimal associated T2 and FLAIR hyperintensity. No associated hemorrhage or mass effect. No other restricted diffusion. Major intracranial vascular flow voids are preserved. Outside of the acute findings, normal gray and white matter signal throughout the brain. No encephalomalacia or chronic cerebral blood products identified. No midline shift, mass effect, evidence of mass lesion, ventriculomegaly, extra-axial collection or acute intracranial hemorrhage. Cervicomedullary junction and pituitary are within normal limits. Visible internal auditory structures appear normal. Mastoids are clear. Trace ethmoid sinus mucosal thickening. Negative orbit and scalp soft tissues. Negative visualized cervical spine. IMPRESSION: 1. Small acute lacunar infarct in the dorsal left thalamus. No associated hemorrhage or mass effect. 2. Otherwise normal noncontrast MRI appearance of the brain. Electronically Signed   By: Genevie Ann M.D.   On: 07/02/2015 12:03     MDM   Final diagnoses:  Cerebral infarction due to unspecified mechanism   Patient symptoms had resolved by  9:30 this morning but 2 episodes of right-sided inside the mouth numbness and numbness to the right thumb and index finger. CT scan was negative MRI shows evidence of an acute left-sided the  thalamic stroke. No bleed. Small in size. Discussed with hospitalist here. They will admit for stroke evaluation.  I personally performed the services described in this documentation, which was scribed in my presence. The recorded information has been reviewed and is accurate.        Fredia Sorrow, MD 07/02/15 1414

## 2015-07-02 NOTE — Telephone Encounter (Signed)
ERROR

## 2015-07-03 ENCOUNTER — Observation Stay (HOSPITAL_COMMUNITY): Payer: 59

## 2015-07-03 ENCOUNTER — Observation Stay (HOSPITAL_BASED_OUTPATIENT_CLINIC_OR_DEPARTMENT_OTHER): Payer: 59

## 2015-07-03 DIAGNOSIS — I6350 Cerebral infarction due to unspecified occlusion or stenosis of unspecified cerebral artery: Secondary | ICD-10-CM

## 2015-07-03 DIAGNOSIS — I6309 Cerebral infarction due to thrombosis of other precerebral artery: Secondary | ICD-10-CM | POA: Diagnosis not present

## 2015-07-03 DIAGNOSIS — I10 Essential (primary) hypertension: Secondary | ICD-10-CM | POA: Diagnosis not present

## 2015-07-03 LAB — LIPID PANEL
Cholesterol: 166 mg/dL (ref 0–200)
HDL: 44 mg/dL (ref >40)
LDL CALC: 108 mg/dL — AB (ref 0–99)
Total CHOL/HDL Ratio: 3.8 RATIO
Triglycerides: 72 mg/dL (ref <150)
VLDL: 14 mg/dL (ref 0–40)

## 2015-07-03 LAB — ECHOCARDIOGRAM COMPLETE
HEIGHTINCHES: 62 in
WEIGHTICAEL: 3520 [oz_av]

## 2015-07-03 MED ORDER — ATORVASTATIN CALCIUM 20 MG PO TABS: 20.0000 mg | ORAL_TABLET | Freq: Every day | ORAL | Status: DC

## 2015-07-03 MED ORDER — ASPIRIN EC 81 MG PO TBEC: 81.0000 mg | DELAYED_RELEASE_TABLET | Freq: Every day | ORAL | Status: DC

## 2015-07-03 NOTE — Progress Notes (Signed)
Patient with orders to be discharge home. Discharge instructions given, patient verbalized understanding. Prescriptions given. Patient stable. Patient left in private vehicle with spouse.  

## 2015-07-03 NOTE — Discharge Summary (Signed)
Physician Discharge Summary  Charlis Melnichuk [id number] DOB: 04-15-1960 DOA: 07/02/2015  PCP: Mickie Hillier, MD  Admit date: 07/02/2015 Discharge date: 07/03/2015  Time spent: 45 minutes  Recommendations for Outpatient Follow-up:  -Will be discharged home today. -Advised to follow up with PCP in 2 weeks.   Discharge Diagnoses:  Principal Problem:   CVA (cerebral infarction) Active Problems:   Essential hypertension, benign   Discharge Condition: Stable and improved  Filed Weights   07/02/15 1512  Weight: 99.791 kg (220 lb)    History of present illness:  Patient is a pleasant 55 year old woman with past medical history only significant for hypertension who describes an episode of right sided mouth numbness that began yesterday at around 6:30 PM. This resolved after 40 minutes and she didn't think much about it. This morning she again noticed this right mouth numbness that began at around 7:30 AM. She states it feels as if she had gone to the dentist to where her lips and right side of her mouth were numb. She mentioned this to a coworker who urged her to seek medical attention. She went to see Dr. Wolfgang Phoenix and was advised to come to the emergency department for evaluation. In the ED her lab work has essentially been within normal limits with the exception of a slightly low potassium at 3.3, CT scan of the head showed no acute abnormalities, MRI showed a small acute lacunar infarct in the dorsal left thalamus with no associated hemorrhage or mass effect. We were asked to admit her for further evaluation and management.  Hospital Course:   Acute CVA -Symptoms have resolved. -DC on ASA 81 mg daily for secondary stroke prevention. -Carotid dopplers show <50% bilateral stenosis. F/u with repeat US in 6 months is recommended. -Started on a statin for LDL >100. -Advised risk factor modification including exercise, weight loss and good BP control.  HTN -Well controlled. -Continue  current management.  Hypokalemia -Repleted.  Hyperlipidemia -LDL 108. -Lipitor 20 ordered on DC. -F/u LFTs and lipids in 6-8 weeks.  Procedures: ECHO:Study Conclusions  - Left ventricle: The cavity size was normal. Systolic function was  normal. The estimated ejection fraction was in the range of 60%  to 65%. Wall motion was normal; there were no regional wall  motion abnormalities. - Atrial septum: No defect or patent foramen ovale was identified.  Impressions:   - No cardiac source of emboli was indentified. Carotid Dopplers:IMPRESSION: 1. Bilateral carotid bifurcation and proximal ICA plaque resulting in less than 50% diameter stenosis. The exam does not exclude plaque  ulceration or embolization. Continued surveillance recommended.    Consultations:  None  Discharge Instructions      Discharge Instructions    Diet - low sodium heart healthy    Complete by:  As directed      Increase activity slowly    Complete by:  As directed             Medication List    TAKE these medications        aspirin EC 81 MG tablet  Take 1 tablet (81 mg total) by mouth daily.     atorvastatin 20 MG tablet  Commonly known as:  LIPITOR  Take 1 tablet (20 mg total) by mouth daily.     gabapentin 100 MG capsule  Commonly known as:  NEURONTIN  Take 1 capsule (100 mg total) by mouth every 8 (eight) hours as needed.     ibuprofen 200 MG tablet  Commonly  known as:  ADVIL,MOTRIN  Take 200 mg by mouth as needed.     LOCOID LIPOCREAM 0.1 % Crea  Generic drug:  Hydrocortisone Butyr Lipo Base  APPLY TO AFFECTED AREA TWICE DAILY AS NEEDED.     mometasone 0.1 % cream  Commonly known as:  ELOCON  Apply 1 application topically 2 (two) times daily as needed.     pantoprazole 40 MG tablet  Commonly known as:  PROTONIX  Take 1 tablet (40 mg total) by mouth daily.     triamterene-hydrochlorothiazide 37.5-25 MG tablet  Commonly known as:  MAXZIDE-25  Take 1 tablet by  mouth daily.       Allergies  Allergen Reactions  . Adhesive [Tape] Rash   Follow-up Information    Follow up with Mickie Hillier, MD. Schedule an appointment as soon as possible for a visit in 2 weeks.   Specialty:  Family Medicine   Contact information:   [address] [place] [address] [phone]        The results of significant diagnostics from this hospitalization (including imaging, microbiology, ancillary and laboratory) are listed below for reference.    Significant Diagnostic Studies: Ct Head Wo Contrast  07/02/2015  CLINICAL DATA:  Facial numbness EXAM: CT HEAD WITHOUT CONTRAST TECHNIQUE: Contiguous axial images were obtained from the base of the skull through the vertex without intravenous contrast. COMPARISON:  None. FINDINGS: Bony calvarium is intact. No gross soft tissue abnormality is noted. No findings to suggest acute hemorrhage, acute infarction or space-occupying mass lesion are noted. IMPRESSION: No acute intracranial abnormality noted. Electronically Signed   By: Inez Catalina M.D.   On: 07/02/2015 10:42   Mr Brain Wo Contrast  07/02/2015  CLINICAL DATA:  55 year old female with right facial numbness intermittently since yesterday. Initial encounter. EXAM: MRI HEAD WITHOUT CONTRAST TECHNIQUE: Multiplanar, multiecho pulse sequences of the brain and surrounding structures were obtained without intravenous contrast. COMPARISON:  Head CT without contrast 1029 hours today. FINDINGS: 8 mm focus of restricted diffusion in the dorsal medial left thalamus best seen on series 101, image 23. Minimal associated T2 and FLAIR hyperintensity. No associated hemorrhage or mass effect. No other restricted diffusion. Major intracranial vascular flow voids are preserved. Outside of the acute findings, normal gray and white matter signal throughout the brain. No encephalomalacia or chronic cerebral blood products identified. No midline shift, mass effect, evidence of mass  lesion, ventriculomegaly, extra-axial collection or acute intracranial hemorrhage. Cervicomedullary junction and pituitary are within normal limits. Visible internal auditory structures appear normal. Mastoids are clear. Trace ethmoid sinus mucosal thickening. Negative orbit and scalp soft tissues. Negative visualized cervical spine. IMPRESSION: 1. Small acute lacunar infarct in the dorsal left thalamus. No associated hemorrhage or mass effect. 2. Otherwise normal noncontrast MRI appearance of the brain. Electronically Signed   By: Genevie Ann M.D.   On: 07/02/2015 12:03   US Carotid Bilateral  07/03/2015  CLINICAL DATA:  Right facial numbness x40 minutes twice yesterday. Hypertension. EXAM: BILATERAL CAROTID DUPLEX ULTRASOUND TECHNIQUE: Pearline Cables scale imaging, color Doppler and duplex ultrasound was performed of bilateral carotid and vertebral arteries in the neck. COMPARISON:  None. REVIEW OF SYSTEMS: Quantification of carotid stenosis is based on velocity parameters that correlate the residual internal carotid diameter with NASCET-based stenosis levels, using the diameter of the distal internal carotid lumen as the denominator for stenosis measurement. The following velocity measurements were obtained: PEAK SYSTOLIC/END DIASTOLIC RIGHT ICA:  122/44cm/sec CCA:                     [id number] SYSTOLIC ICA/CCA RATIO:  [id number] DIASTOLIC ICA/CCA RATIO: [id number] ECA:                     95cm/sec LEFT ICA:                     95/36cm/sec CCA:                     [id number] SYSTOLIC ICA/CCA RATIO:  [id number] DIASTOLIC ICA/CCA RATIO: [id number] ECA:                     110cm/sec FINDINGS: RIGHT CAROTID ARTERY: Mild eccentric plaque in the carotid bulb without high-grade stenosis. Normal waveforms and color Doppler signal. RIGHT VERTEBRAL ARTERY:  Normal flow direction and waveform. LEFT CAROTID ARTERY: Intimal thickening through the common carotid artery. Mild eccentric plaque in the carotid bulb. No high-grade stenosis.  Normal waveforms and color Doppler signal. Distal ICA tortuous. LEFT VERTEBRAL ARTERY: Normal flow direction and waveform. IMPRESSION: 1. Bilateral carotid bifurcation and proximal ICA plaque resulting in less than 50% diameter stenosis. The exam does not exclude plaque ulceration or embolization. Continued surveillance recommended. Electronically Signed   By: Lucrezia Europe M.D.   On: 07/03/2015 09:33    Microbiology: No results found for this or any previous visit (from the past 240 hour(s)).   Labs: Basic Metabolic Panel:  Recent Labs Lab 07/02/15 0955 07/02/15 1901  NA 141  --   K 3.3*  --   CL 105  --   CO2 27  --   GLUCOSE 101*  --   BUN 15  --   CREATININE 1.13*  --   CALCIUM 8.7*  --   MG  --  2.0   Liver Function Tests:  Recent Labs Lab 07/02/15 0955  AST 20  ALT 25  ALKPHOS 78  BILITOT 0.7  PROT 7.3  ALBUMIN 3.9   No results for input(s): LIPASE, AMYLASE in the last 168 hours. No results for input(s): AMMONIA in the last 168 hours. CBC:  Recent Labs Lab 07/02/15 0955  WBC 4.2  NEUTROABS 2.2  HGB 13.3  HCT 39.5  MCV 94.5  PLT 255   Cardiac Enzymes: No results for input(s): CKTOTAL, CKMB, CKMBINDEX, TROPONINI in the last 168 hours. BNP: BNP (last 3 results) No results for input(s): BNP in the last 8760 hours.  ProBNP (last 3 results) No results for input(s): PROBNP in the last 8760 hours.  CBG: No results for input(s): GLUCAP in the last 168 hours.     SignedLelon Frohlich  [facility] Pager: [phone] 07/03/2015, 3:37 PM

## 2015-07-03 NOTE — Progress Notes (Signed)
PT Cancellation Note  Patient Details Name: Jo Hale MRN: [id number] DOB: 1960/12/22   Cancelled Treatment:    Reason Eval/Treat Not Completed: PT screened, no needs identified, will sign off   Sable Feil  PT 07/03/2015, 10:39 AM

## 2015-07-03 NOTE — Consult Note (Signed)
[place] A. Merlene Laughter, MD     [email or web address]          Jo Hale is an 55 y.o. female.   ASSESSMENT/PLAN:   Chart and imaging are reviewed in person ( Pt left before being seen). 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 Risk factors of hypertension. Aspirin antiplatelet agents is appropriate.   Blood pressure 131/70, pulse 77, temperature 98.2 F (36.8 C), temperature source Oral, resp. rate 16, height _0  (1.575 m), weight 99.791 kg (220 lb), last menstrual period 02/17/2011, SpO2 100 %.  Past Medical History  Diagnosis Date  . GERD (gastroesophageal reflux disease)   . Hypertension   . Fibroids, intramural     450-600 gm uterus  . Abnormal pap   . Fibroids   . Posterior pain of right hip 07/31/2013  . Vaginal Pap smear, abnormal   . Menopause 08/27/2014    Past Surgical History  Procedure Laterality Date  . Cesarean section    . Tubal ligation    . Colonoscopy  02/19/2011    Fields-incomplete exam, otherwise normal   . Laparoscopic supracervical hysterectomy  03/02/2011    Procedure: LAPAROSCOPIC SUPRACERVICAL HYSTERECTOMY;  Surgeon: Jonnie Kind, MD;  Location: [facility];  Service: Gynecology;  Laterality: N/A;  . Partial hysterectomy    . Rotator cuff repair      left    Family History  Problem Relation Age of Onset  . Colon cancer Neg Hx   . Heart disease Mother   . Dementia Mother   . Congestive Heart Failure Father   . Mental illness Brother   . Early death Sister   . Alcohol abuse Sister   . Stroke Sister   . Mental illness Brother   . Mental illness Brother     Social History:  reports that she has never smoked. She has never used smokeless tobacco. She reports that she does not drink alcohol or use illicit drugs.  Allergies:  Allergies  Allergen Reactions  . Adhesive [Tape] Rash    Medications: Prior to  Admission medications   Medication Sig Start Date End Date Taking? Authorizing Provider  gabapentin (NEURONTIN) 100 MG capsule Take 1 capsule (100 mg total) by mouth every 8 (eight) hours as needed. 12/04/14  Yes Mikey Kirschner, MD  ibuprofen (ADVIL,MOTRIN) 200 MG tablet Take 200 mg by mouth as needed.   Yes Historical Provider, MD  LOCOID LIPOCREAM 0.1 % CREA APPLY TO AFFECTED AREA TWICE DAILY AS NEEDED. Patient taking differently: daily 11/27/13  Yes Mikey Kirschner, MD  mometasone (ELOCON) 0.1 % cream Apply 1 application topically 2 (two) times daily as needed. 03/13/15  Yes Kathyrn Drown, MD  pantoprazole (PROTONIX) 40 MG tablet Take 1 tablet (40 mg total) by mouth daily. 06/20/15 06/19/16 Yes Nilda Simmer, NP  triamterene-hydrochlorothiazide (MAXZIDE-25) 37.5-25 MG per tablet Take 1 tablet by mouth daily. 12/04/14  Yes Mikey Kirschner, MD  aspirin EC 81 MG tablet Take 1 tablet (81 mg total) by mouth daily. 07/03/15   Erline Hau, MD  atorvastatin (LIPITOR) 20 MG tablet Take 1 tablet (20 mg total) by mouth daily. 07/03/15   Erline Hau, MD    Scheduled Meds: . aspirin  300 mg Rectal Daily   Or  . aspirin  325 mg Oral Daily  . enoxaparin (LOVENOX) injection  40 mg Subcutaneous Q24H  .  gabapentin  100 mg Oral 3 times per day  . pantoprazole  40 mg Oral Daily  . triamterene-hydrochlorothiazide  1 tablet Oral Daily   Continuous Infusions:  PRN Meds:.senna-docusate     Results for orders placed or performed during the hospital encounter of 07/02/15 (from the past 48 hour(s))  Protime-INR     Status: None   Collection Time: 07/02/15  9:55 AM  Result Value Ref Range   Prothrombin Time 14.1 11.6 - 15.2 seconds   INR 1.07 0.00 - 1.49  APTT     Status: None   Collection Time: 07/02/15  9:55 AM  Result Value Ref Range   aPTT 28 24 - 37 seconds  CBC     Status: None   Collection Time: 07/02/15  9:55 AM  Result Value Ref Range   WBC 4.2 4.0 - 10.5 K/uL    RBC 4.18 3.87 - 5.11 MIL/uL   Hemoglobin 13.3 12.0 - 15.0 g/dL   HCT 39.5 36.0 - 46.0 %   MCV 94.5 78.0 - 100.0 fL   MCH 31.8 26.0 - 34.0 pg   MCHC 33.7 30.0 - 36.0 g/dL   RDW 13.4 11.5 - 15.5 %   Platelets 255 150 - 400 K/uL  Differential     Status: None   Collection Time: 07/02/15  9:55 AM  Result Value Ref Range   Neutrophils Relative % 51 %   Neutro Abs 2.2 1.7 - 7.7 K/uL   Lymphocytes Relative 32 %   Lymphs Abs 1.3 0.7 - 4.0 K/uL   Monocytes Relative 13 %   Monocytes Absolute 0.6 0.1 - 1.0 K/uL   Eosinophils Relative 3 %   Eosinophils Absolute 0.1 0.0 - 0.7 K/uL   Basophils Relative 1 %   Basophils Absolute 0.0 0.0 - 0.1 K/uL  Comprehensive metabolic panel     Status: Abnormal   Collection Time: 07/02/15  9:55 AM  Result Value Ref Range   Sodium 141 135 - 145 mmol/L   Potassium 3.3 (L) 3.5 - 5.1 mmol/L   Chloride 105 101 - 111 mmol/L   CO2 27 22 - 32 mmol/L   Glucose, Bld 101 (H) 65 - 99 mg/dL   BUN 15 6 - 20 mg/dL   Creatinine, Ser 1.13 (H) 0.44 - 1.00 mg/dL   Calcium 8.7 (L) 8.9 - 10.3 mg/dL   Total Protein 7.3 6.5 - 8.1 g/dL   Albumin 3.9 3.5 - 5.0 g/dL   AST 20 15 - 41 U/L   ALT 25 14 - 54 U/L   Alkaline Phosphatase 78 38 - 126 U/L   Total Bilirubin 0.7 0.3 - 1.2 mg/dL   GFR calc non Af Amer 54 (L) >60 mL/min   GFR calc Af Amer >60 >60 mL/min    Comment: (NOTE) The eGFR has been calculated using the CKD EPI equation. This calculation has not been validated in all clinical situations. eGFR's persistently <60 mL/min signify possible Chronic Kidney Disease.    Anion gap 9 5 - 15  I-stat troponin, ED (not at [facility], [facility])     Status: None   Collection Time: 07/02/15 10:02 AM  Result Value Ref Range   Troponin i, poc 0.00 0.00 - 0.08 ng/mL   Comment 3            Comment: Due to the release kinetics of cTnI, a negative result within the first hours of the onset of symptoms does not rule out myocardial infarction with certainty. If myocardial infarction is  still suspected, repeat the test at appropriate intervals.   Magnesium     Status: None   Collection Time: 07/02/15  7:01 PM  Result Value Ref Range   Magnesium 2.0 1.7 - 2.4 mg/dL  Lipid panel     Status: Abnormal   Collection Time: 07/03/15  5:22 AM  Result Value Ref Range   Cholesterol 166 0 - 200 mg/dL   Triglycerides 72 <150 mg/dL   HDL 44 >40 mg/dL   Total CHOL/HDL Ratio 3.8 RATIO   VLDL 14 0 - 40 mg/dL   LDL Cholesterol 108 (H) 0 - 99 mg/dL    Comment:        Total Cholesterol/HDL:CHD Risk Coronary Heart Disease Risk Table                     Men   Women  1/2 Average Risk   3.4   3.3  Average Risk       5.0   4.4  2 X Average Risk   9.6   7.1  3 X Average Risk  23.4   11.0        Use the calculated Patient Ratio above and the CHD Risk Table to determine the patient's CHD Risk.        ATP III CLASSIFICATION (LDL):  <100     mg/dL   Optimal  100-129  mg/dL   Near or Above                    Optimal  130-159  mg/dL   Borderline  160-189  mg/dL   High  >190     mg/dL   Very High     Studies/Results:  BRAIN MRI FINDINGS: 8 mm focus of restricted diffusion in the dorsal medial left thalamus best seen on series 101, image 23. Minimal associated T2 and FLAIR hyperintensity. No associated hemorrhage or mass effect.  No other restricted diffusion. Major intracranial vascular flow voids are preserved.  Outside of the acute findings, normal gray and white matter signal throughout the brain. No encephalomalacia or chronic cerebral blood products identified. No midline shift, mass effect, evidence of mass lesion, ventriculomegaly, extra-axial collection or acute intracranial hemorrhage. Cervicomedullary junction and pituitary are within normal limits.  Visible internal auditory structures appear normal. Mastoids are clear. Trace ethmoid sinus mucosal thickening. Negative orbit and scalp soft tissues. Negative visualized cervical spine.  IMPRESSION: 1. Small  acute lacunar infarct in the dorsal left thalamus. No associated hemorrhage or mass effect. 2. Otherwise normal noncontrast MRI appearance of the brain.   CAROTID DOPPLERS NORMAL   ECHO - Left ventricle: The cavity size was normal. Systolic function was  normal. The estimated ejection fraction was in the range of 60%  to 65%. Wall motion was normal; there were no regional wall  motion abnormalities. - Atrial septum: No defect or patent foramen ovale was identified.  Impressions:  - No cardiac source of emboli was indentified.     Galen Russman A. Merlene Laughter, M.D.  Diplomate, Tax adviser of Psychiatry and Neurology ( Neurology). 07/03/2015, 6:18 PM

## 2015-07-03 NOTE — Progress Notes (Signed)
OT Screen Note  Patient Details Name: Jo Hale MRN: [id number] DOB: January 22, 1961   Cancelled Treatment:    Reason Eval/Treat Not Completed: OT screened, no needs identified, will sign off. Spoke with patient who reports that numbness and tingling has subsided on right side. BUE strength and coordination assessed which are functional and at baseline. Patient reports no deficits and feels safe to return home.   Ailene Ravel, OTR/L,CBIS  [phone]  07/03/2015, 9:30 AM

## 2015-07-04 ENCOUNTER — Telehealth: Payer: Self-pay | Admitting: Family Medicine

## 2015-07-04 LAB — HEMOGLOBIN A1C
Hgb A1c MFr Bld: 5.4 % (ref 4.8–5.6)
Mean Plasma Glucose: 108 mg/dL

## 2015-07-04 NOTE — Care Management (Signed)
Late Entry: Pt discharged home with self care. PT has PCP, insurance, transportation and no difficulty obtaining medications. Pt has no HH services or DME needs. Family at bedside. No CM needs identified.

## 2015-07-04 NOTE — Telephone Encounter (Signed)
Discussed with pt. Pt verbalized understanding. Pt transferred to front to schedule hospital follow up.

## 2015-07-04 NOTE — Telephone Encounter (Signed)
Yes, hi likelihood viral at this point, lwt her know we've read all the hosp documents from her hos[pitalizatioin this wk and look forward to following up with her in the office (pa may or may nt have already sched f u appt) in hosp this wk with mild stroke

## 2015-07-04 NOTE — Telephone Encounter (Signed)
Patient has a scratchy throat, drainage, and clear mucous.  She said she has some Fluticasone nasal spray left over and wants to know would it be okay for her to just use this instead of her coming to the office to be seen.

## 2015-07-08 ENCOUNTER — Ambulatory Visit (INDEPENDENT_AMBULATORY_CARE_PROVIDER_SITE_OTHER): Payer: BLUE CROSS/BLUE SHIELD | Admitting: Family Medicine

## 2015-07-08 ENCOUNTER — Encounter: Payer: Self-pay | Admitting: Family Medicine

## 2015-07-08 VITALS — BP 134/82 | Temp 98.1°F | Ht 62.0 in | Wt 234.2 lb

## 2015-07-08 DIAGNOSIS — Z79899 Other long term (current) drug therapy: Secondary | ICD-10-CM | POA: Diagnosis not present

## 2015-07-08 DIAGNOSIS — M5410 Radiculopathy, site unspecified: Secondary | ICD-10-CM | POA: Diagnosis not present

## 2015-07-08 DIAGNOSIS — Z13220 Encounter for screening for lipoid disorders: Secondary | ICD-10-CM | POA: Diagnosis not present

## 2015-07-08 MED ORDER — ATORVASTATIN CALCIUM 20 MG PO TABS: 20.0000 mg | ORAL_TABLET | Freq: Every day | ORAL | Status: DC

## 2015-07-08 MED ORDER — TRIAMTERENE-HCTZ 37.5-25 MG PO TABS: 1.0000 | ORAL_TABLET | Freq: Every day | ORAL | Status: DC

## 2015-07-08 MED ORDER — AMOXICILLIN 500 MG PO TABS: ORAL_TABLET | ORAL | Status: DC

## 2015-07-08 MED ORDER — GABAPENTIN 100 MG PO CAPS: 100.0000 mg | ORAL_CAPSULE | Freq: Three times a day (TID) | ORAL | Status: DC | PRN

## 2015-07-08 MED ORDER — PANTOPRAZOLE SODIUM 40 MG PO TBEC: 40.0000 mg | DELAYED_RELEASE_TABLET | Freq: Every day | ORAL | Status: DC

## 2015-07-08 NOTE — Progress Notes (Signed)
   Subjective:    Patient ID: Jo Hale, female    DOB: 15-Oct-1960, 55 y.o.   MRN: [id number]  HPI Patient in today for an Hospital follow up for a Cerebral infarction. Patient was seen at [facility] on 07/02/15. (see ER note).  Patient would like to discuss Lipitor testing 123 testing patient was placed on Lipitor in the hospital. Had elevated LDL. Next  Entire hospital records reviewed today in patient's presence. Including all consult notes reports etc. Patient experienced a lacunar infarct. MRI revealed this. Negative carotid. Negative echocardiogram. Next  Long-standing history of hypertension.  Patient reports the diminished sensation on the side of her face and hand have resolved. No residual symptoms as far she notes.   Patient also has c/o of possible sinusitis. Congestion drainage cough stuffiness  Trying to eat the right things, exercising not a whole lot    Review of Systems No headache, no major weight loss or weight gain, no chest pain no back pain abdominal pain no change in bowel habits complete ROS otherwise negative     Objective:   Physical Exam  Alert vital stable blood pressure good on repeat HEENT moderate nasal congestion pharynx normal neck supple lungs clear heart regular rhythm sensory exam intact cerebellar function normal due to reflexes strength intact      Assessment & Plan:  Impression 1 lacunar infarction discussed at length. Including nature and long-term risk factors #2 hypertension good control discussed #3 hyperlipidemia borderline but with history of stroke agree with initiating statins No. 4 rhinosinusitis plan diet exercise discussed. Medications discussed. Recheck as scheduled, maintain all medications WSL

## 2015-08-05 LAB — LIPID PANEL
CHOLESTEROL TOTAL: 164 mg/dL (ref 100–199)
Chol/HDL Ratio: 2.8 ratio units (ref 0.0–4.4)
HDL: 59 mg/dL (ref >39)
LDL Calculated: 91 mg/dL (ref 0–99)
Triglycerides: 70 mg/dL (ref 0–149)
VLDL CHOLESTEROL CAL: 14 mg/dL (ref 5–40)

## 2015-08-05 LAB — HEPATIC FUNCTION PANEL
ALBUMIN: 4 g/dL (ref 3.5–5.5)
ALK PHOS: 77 IU/L (ref 39–117)
ALT: 28 IU/L (ref 0–32)
AST: 30 IU/L (ref 0–40)
BILIRUBIN TOTAL: 0.3 mg/dL (ref 0.0–1.2)
BILIRUBIN, DIRECT: 0.14 mg/dL (ref 0.00–0.40)
TOTAL PROTEIN: 6.7 g/dL (ref 6.0–8.5)

## 2015-08-29 ENCOUNTER — Other Ambulatory Visit (HOSPITAL_COMMUNITY)
Admission: RE | Admit: 2015-08-29 | Discharge: 2015-08-29 | Disposition: A | Discharge status: Home / Self Care | Payer: 59 | Source: Ambulatory Visit | Attending: Adult Health | Admitting: Adult Health

## 2015-08-29 ENCOUNTER — Encounter: Payer: Self-pay | Admitting: Adult Health

## 2015-08-29 ENCOUNTER — Ambulatory Visit (INDEPENDENT_AMBULATORY_CARE_PROVIDER_SITE_OTHER): Payer: 59 | Admitting: Adult Health

## 2015-08-29 VITALS — BP 136/78 | HR 70 | Ht 63.0 in | Wt 233.0 lb

## 2015-08-29 DIAGNOSIS — Z01419 Encounter for gynecological examination (general) (routine) without abnormal findings: Secondary | ICD-10-CM

## 2015-08-29 DIAGNOSIS — Z1211 Encounter for screening for malignant neoplasm of colon: Secondary | ICD-10-CM

## 2015-08-29 DIAGNOSIS — Z1151 Encounter for screening for human papillomavirus (HPV): Secondary | ICD-10-CM | POA: Insufficient documentation

## 2015-08-29 DIAGNOSIS — K5900 Constipation, unspecified: Secondary | ICD-10-CM | POA: Diagnosis not present

## 2015-08-29 DIAGNOSIS — Z01411 Encounter for gynecological examination (general) (routine) with abnormal findings: Secondary | ICD-10-CM

## 2015-08-29 LAB — HEMOCCULT GUIAC POC 1CARD (OFFICE): Fecal Occult Blood, POC: NEGATIVE

## 2015-08-29 NOTE — Progress Notes (Signed)
Patient ID: Jo Hale, female   DOB: 28-Dec-1960, 55 y.o.   MRN: [id number] History of Present Illness: Jo Hale is a 55 year old black female in for well woman gyn exam and pap.She had a stroke in [place] had numbness in mouth, but is doing great, no residual.She is complaining of constipation, stools hard at times. PCP is Jo Hale.  Current Medications, Allergies, Past Medical History, Past Surgical History, Family History and Social History were reviewed in [employer] record.     Review of Systems:  Patient denies any headaches, hearing loss, fatigue, blurred vision, shortness of breath, chest pain, abdominal pain, problems with urination, or intercourse. No joint pain or mood swings. See HPI for positives.  Physical Exam:BP 136/78 mmHg  Pulse 70  Ht 5\' 3"  (1.6 m)  Wt 233 lb (105.688 kg)  BMI 41.28 kg/m2  LMP 02/17/2011 She had a supracervical hysterectomy. General:  Well developed, well nourished, no acute distress Skin:  Warm and dry, no rashes Neck:  Midline trachea, normal thyroid, good ROM, no lymphadenopathy, no carotid bruits heard Lungs; Clear to auscultation bilaterally Breast:  No dominant palpable mass, retraction, or nipple discharge Cardiovascular: Regular rate and rhythm Abdomen:  Soft, non tender, no hepatosplenomegaly Pelvic:  External genitalia is normal in appearance, no lesions.  The vagina is normal in appearance. Urethra has no lesions or masses. The cervix is bulbous. Pap with HPV performed.   No adnexal masses or tenderness noted.Bladder is non tender, no masses felt. Rectal: Good sphincter tone, no polyps, or hemorrhoids felt.  Hemoccult negative. Extremities/musculoskeletal:  No swelling or varicosities noted, no clubbing or cyanosis Psych:  No mood changes, alert and cooperative,seems happy   Impression: Well woman gyn exam and pap Constipation     Plan: Increase water and try 5 prunes a day, or 1/2 cup applesauce  and 1/2 cup oatmeal and 1/2 cup prune juice everyday Physical in 1 year, pap in 3 if normal Mammogram yearly Colonoscopy 2022 Labs with PCP

## 2015-08-29 NOTE — Patient Instructions (Signed)
Physical in  1year, pap in 3 if normal Colonoscopy 2022 Mammogram yearly Labs with PCP Try 1/2 applesauce. 1/2 up oatmeal and 1/2 cup prune juice or 5 prunes every day and increase water

## 2015-09-01 LAB — CYTOLOGY - PAP

## 2015-09-09 ENCOUNTER — Ambulatory Visit: Payer: BLUE CROSS/BLUE SHIELD | Admitting: Family Medicine

## 2015-09-16 ENCOUNTER — Encounter: Payer: Self-pay | Admitting: Family Medicine

## 2015-09-16 ENCOUNTER — Ambulatory Visit (INDEPENDENT_AMBULATORY_CARE_PROVIDER_SITE_OTHER): Payer: 59 | Admitting: Family Medicine

## 2015-09-16 VITALS — BP 134/80 | Ht 62.0 in | Wt 229.0 lb

## 2015-09-16 DIAGNOSIS — I10 Essential (primary) hypertension: Secondary | ICD-10-CM

## 2015-09-16 DIAGNOSIS — Z8673 Personal history of transient ischemic attack (TIA), and cerebral infarction without residual deficits: Secondary | ICD-10-CM

## 2015-09-16 DIAGNOSIS — Z79899 Other long term (current) drug therapy: Secondary | ICD-10-CM | POA: Diagnosis not present

## 2015-09-16 DIAGNOSIS — E785 Hyperlipidemia, unspecified: Secondary | ICD-10-CM

## 2015-09-16 NOTE — Progress Notes (Signed)
   Subjective:    Patient ID: Jo Hale, female    DOB: May 24, 1960, 55 y.o.   MRN: [id number] Patient arrives office with numerous concerns   Hypertension This is a chronic problem. Compliance problems include diet (does not eat a lot of vegetables).    Concerned about dark circles around eyes. Dark spots under the eyes. Spring allergies not a problem. Overall more sleep now then before, had a lot of stress lately  Came up at the time of her mini stroke.   Dr Jo Hale has pt on doxy and advised her not to take the lipitor  Overall watching diet and doing better, has lost eleven pounds, walking reg,  BP numbers btter. Compliant with blood pressure medicine claims to not miss a dose. Watching salt intake  Was compliant with lipid medicine. Curiously patients dermatologist recommend she not take it while she is on doxycycline  No further symptoms of stroke. No neurological dysfunction.  Hx lacunar stroke, no w beter   Review of Systems No headache, no major weight loss or weight gain, no chest pain no back pain abdominal pain no change in bowel habits complete ROS otherwise negative     Objective:   Physical Exam  Alert no major distress HEENT normal blood pressure good on repeat lungs clear. Heart regular in rhythm. Ankles trace edema no focal neurological deficit      Assessment & Plan:  Impression 1 history of stroke clinically stable at this time no re-occurrence evident #2 hypertension good control discussed maintain same meds #3 hyperlipidemia prior control discussed curiously told to stop them messed my dermatologist in the patient in this plan encouraged to get back on the Lipitor as soon as she possibly can. Maintain blood pressure meds diet exercise discussed appropriate blood work month after getting back on the lipid meds follow-up as scheduled questions answered WSL

## 2015-09-18 DIAGNOSIS — Z8673 Personal history of transient ischemic attack (TIA), and cerebral infarction without residual deficits: Secondary | ICD-10-CM | POA: Insufficient documentation

## 2015-09-18 DIAGNOSIS — E785 Hyperlipidemia, unspecified: Secondary | ICD-10-CM | POA: Insufficient documentation

## 2015-12-17 ENCOUNTER — Ambulatory Visit: Payer: 59 | Admitting: Family Medicine

## 2015-12-18 LAB — HEPATIC FUNCTION PANEL
ALBUMIN: 4.3 g/dL (ref 3.5–5.5)
ALK PHOS: 103 IU/L (ref 39–117)
ALT: 68 IU/L — ABNORMAL HIGH (ref 0–32)
AST: 50 IU/L — ABNORMAL HIGH (ref 0–40)
BILIRUBIN TOTAL: 0.5 mg/dL (ref 0.0–1.2)
BILIRUBIN, DIRECT: 0.15 mg/dL (ref 0.00–0.40)
TOTAL PROTEIN: 7.3 g/dL (ref 6.0–8.5)

## 2015-12-18 LAB — LIPID PANEL
CHOL/HDL RATIO: 2.5 ratio (ref 0.0–4.4)
Cholesterol, Total: 134 mg/dL (ref 100–199)
HDL: 54 mg/dL (ref >39)
LDL Calculated: 64 mg/dL (ref 0–99)
Triglycerides: 81 mg/dL (ref 0–149)
VLDL Cholesterol Cal: 16 mg/dL (ref 5–40)

## 2015-12-23 ENCOUNTER — Ambulatory Visit (INDEPENDENT_AMBULATORY_CARE_PROVIDER_SITE_OTHER): Payer: 59 | Admitting: Family Medicine

## 2015-12-23 ENCOUNTER — Encounter: Payer: Self-pay | Admitting: Family Medicine

## 2015-12-23 VITALS — BP 124/72 | Ht 62.0 in | Wt 230.2 lb

## 2015-12-23 DIAGNOSIS — G5603 Carpal tunnel syndrome, bilateral upper limbs: Secondary | ICD-10-CM

## 2015-12-23 DIAGNOSIS — E785 Hyperlipidemia, unspecified: Secondary | ICD-10-CM

## 2015-12-23 DIAGNOSIS — I10 Essential (primary) hypertension: Secondary | ICD-10-CM

## 2015-12-23 NOTE — Progress Notes (Signed)
   Subjective:    Patient ID: Jo Hale, female    DOB: 07-Nov-1960, 55 y.o.   MRN: [id number] Patient arrives office with numerous concerns. Hyperlipidemia  This is a chronic problem. The current episode started more than 1 year ago. There are no compliance problems.    Patient has c/o of numbness in right arm. Pt notes numbness in the arm. Most nbights. Shakes them out, changes position, on further history numbness is in both hands. Wakes up with it. Has to shake it out.  Blood pressure medicine and blood pressure levels reviewed today with patient. Compliant with blood pressure medicine. States does not miss a dose. No obvious side effects. Blood pressure generally good when checked elsewhere. Watching salt intake.  refills   Also has c/o dry mouth.worse at night.  Exercise not very good these days, dealing with work nd family responsibilities  Declines flu shot     Results for orders placed or performed in visit on 09/16/15  Lipid panel  Result Value Ref Range   Cholesterol, Total 134 100 - 199 mg/dL   Triglycerides 81 0 - 149 mg/dL   HDL 54 >39 mg/dL   VLDL Cholesterol Cal 16 5 - 40 mg/dL   LDL Calculated 64 0 - 99 mg/dL   Chol/HDL Ratio 2.5 0.0 - 4.4 ratio units  Hepatic function panel  Result Value Ref Range   Total Protein 7.3 6.0 - 8.5 g/dL   Albumin 4.3 3.5 - 5.5 g/dL   Bilirubin Total 0.5 0.0 - 1.2 mg/dL   Bilirubin, Direct 0.15 0.00 - 0.40 mg/dL   Alkaline Phosphatase 103 39 - 117 IU/L   AST 50 (H) 0 - 40 IU/L   ALT 68 (H) 0 - 32 IU/L     Review of Systems No headache, no major weight loss or weight gain, no chest pain no back pain abdominal pain no change in bowel habits complete ROS otherwise negative     Objective:   Physical Exam  Alert vitals stable HEENT normal. Blood pressure good on repeat lungs clear heart regular rate and rhythm. Positive Phalen's sign bilateral      Assessment & Plan:  Impression 1 history of stroke #2 bilateral carpal  tunnel syndrome discussed. Patient advised definite not stroke. Patient wishes to pursue no further workup #3 hyperlipidemia improved discussed patient maintain same meds #4 hypertension good control discussed patient maintain same meds patient declines flu shot diet exercise discussed

## 2016-01-22 ENCOUNTER — Encounter: Payer: Self-pay | Admitting: Gastroenterology

## 2016-03-06 ENCOUNTER — Other Ambulatory Visit: Payer: Self-pay | Admitting: Family Medicine

## 2016-03-11 ENCOUNTER — Telehealth: Payer: Self-pay | Admitting: Family Medicine

## 2016-03-11 NOTE — Telephone Encounter (Signed)
error 

## 2016-05-03 ENCOUNTER — Other Ambulatory Visit: Payer: Self-pay | Admitting: Family Medicine

## 2016-05-13 ENCOUNTER — Ambulatory Visit (INDEPENDENT_AMBULATORY_CARE_PROVIDER_SITE_OTHER): Payer: 59 | Admitting: Family Medicine

## 2016-05-13 ENCOUNTER — Encounter: Payer: Self-pay | Admitting: Family Medicine

## 2016-05-13 VITALS — BP 134/86 | Temp 99.6°F | Ht 62.0 in | Wt 228.0 lb

## 2016-05-13 DIAGNOSIS — J101 Influenza due to other identified influenza virus with other respiratory manifestations: Secondary | ICD-10-CM

## 2016-05-13 DIAGNOSIS — J329 Chronic sinusitis, unspecified: Secondary | ICD-10-CM

## 2016-05-13 MED ORDER — AZITHROMYCIN 250 MG PO TABS: ORAL_TABLET | ORAL | 0 refills | Status: DC

## 2016-05-13 MED ORDER — OSELTAMIVIR PHOSPHATE 75 MG PO CAPS: 75.0000 mg | ORAL_CAPSULE | Freq: Two times a day (BID) | ORAL | 0 refills | Status: AC

## 2016-05-13 NOTE — Progress Notes (Signed)
   Subjective:    Patient ID: Jo Hale, female    DOB: 05-Dec-1960, 56 y.o.   MRN: [id number]  HPI    Review of Systems     Objective:   Physical Exam        Assessment & Plan:

## 2016-05-13 NOTE — Progress Notes (Signed)
   Subjective:    Patient ID: Jo Hale, female    DOB: May 03, 1960, 56 y.o.   MRN: [id number]  Sinusitis  This is a new problem. The current episode started yesterday. Associated symptoms include chills and headaches. Treatments tried: mucinex.   Came on yest erday   Hits pretty hard  Had some fellas sick   Frontal headache  Fever and chills  Cough and cong phlegm for awhile   Is had cough congestion and phlegm for a while. However new onset of symptoms acute and impressive since yesterday  Review of Systems  Constitutional: Positive for chills.  Neurological: Positive for headaches.       Objective:   Physical Exam  Alert, mild malaise. Hydration good Vitals stable. frontal/ maxillary tenderness evident positive nasal congestion. pharynx normal neck supple  lungs clear/no crackles or wheezes. heart regular in rhythm       Assessment & Plan:  Impression influenza with element of rhinosinusitis/bronchitis plan Tamiflu twice a day 5 days. Symptom care discussed. Will cover with antibiotic

## 2016-05-21 ENCOUNTER — Telehealth: Payer: Self-pay | Admitting: Family Medicine

## 2016-05-21 MED ORDER — BENZONATATE 100 MG PO CAPS: ORAL_CAPSULE | ORAL | 0 refills | Status: DC

## 2016-05-21 MED ORDER — AZITHROMYCIN 250 MG PO TABS: ORAL_TABLET | ORAL | 0 refills | Status: DC

## 2016-05-21 NOTE — Telephone Encounter (Signed)
Tess perle 100 mg numb 30 one q six hr prn cough, plus add z pk for secondary bronchitis

## 2016-05-21 NOTE — Telephone Encounter (Signed)
Patient was seen on 05/13/16 for the flu.  She said she is out of all her medication and is requesting something to be called in for a cough.  She said she still has a thick, cloudy, mucous cough.    [employer]

## 2016-05-21 NOTE — Telephone Encounter (Signed)
Spoke with patient and informed her per Dr.Steve Luking- We are sending in [facility], to take 1 every 6 hours as needed for cough. Also sending over a Z pak for secondary bronchitis. Patient verbalized understanding.

## 2016-05-24 ENCOUNTER — Other Ambulatory Visit: Payer: Self-pay | Admitting: Family Medicine

## 2016-06-17 ENCOUNTER — Encounter: Payer: Self-pay | Admitting: Adult Health

## 2016-06-17 ENCOUNTER — Encounter: Payer: 59 | Admitting: Adult Health

## 2016-06-17 ENCOUNTER — Other Ambulatory Visit: Payer: Self-pay | Admitting: Adult Health

## 2016-06-17 DIAGNOSIS — Z1231 Encounter for screening mammogram for malignant neoplasm of breast: Secondary | ICD-10-CM

## 2016-06-17 NOTE — Progress Notes (Signed)
This encounter was created in error - please disregard.

## 2016-06-21 ENCOUNTER — Encounter: Payer: Self-pay | Admitting: Family Medicine

## 2016-06-21 ENCOUNTER — Ambulatory Visit (INDEPENDENT_AMBULATORY_CARE_PROVIDER_SITE_OTHER): Payer: 59 | Admitting: Family Medicine

## 2016-06-21 VITALS — BP 124/76 | Ht 62.0 in | Wt 229.0 lb

## 2016-06-21 DIAGNOSIS — E785 Hyperlipidemia, unspecified: Secondary | ICD-10-CM | POA: Diagnosis not present

## 2016-06-21 DIAGNOSIS — Z1159 Encounter for screening for other viral diseases: Secondary | ICD-10-CM | POA: Diagnosis not present

## 2016-06-21 DIAGNOSIS — E7800 Pure hypercholesterolemia, unspecified: Secondary | ICD-10-CM

## 2016-06-21 DIAGNOSIS — Z8673 Personal history of transient ischemic attack (TIA), and cerebral infarction without residual deficits: Secondary | ICD-10-CM | POA: Diagnosis not present

## 2016-06-21 DIAGNOSIS — I10 Essential (primary) hypertension: Secondary | ICD-10-CM | POA: Diagnosis not present

## 2016-06-21 NOTE — Progress Notes (Signed)
   Subjective:    Patient ID: Jo Hale, female    DOB: 1960-10-26, 56 y.o.   MRN: [id number]  Hypertension  This is a chronic problem. Treatments tried: triamterene- hctz.   Pt states lipitor is causing dry mouth, change in appetite and constipation.   Concerns about taking aleve. Has had chest pain after taking aleve. Pt notes chest pain. Uses the aleave for pain in the knees and hips  Vit D has helped a lot with knees and hips  Not really exrcising  Blood pressure medicine and blood pressure levels reviewed today with patient. Compliant with blood pressure medicine. States does not miss a dose. No obvious side effects. Blood pressure generally good when checked elsewhere. Watching salt intake.   Patient continues to take lipid medication regularly. No obvious side effects from it. Generally does not miss a dose. Prior blood work results are reviewed with patient. Patient continues to work on fat intake in diet  Patient gives history of prior TIA. Was claims compliance with her aspirin Overall watching diet pretty good   Unfortunately not exercising  Review of Systems No headache, no major weight loss or weight gain, no chest pain no back pain abdominal pain no change in bowel habits complete ROS otherwise negative     Objective:   Physical Exam  Alert vitals stable, NAD. Blood pressure good on repeat. HEENT normal. Lungs clear. Heart regular rate and rhythm. No focal neurological deficits blood pressure good on the     Assessment & Plan:  Impression 1 hyperlipidemia old numbers reviewed blood work discussed will check more maintain same meds for now #2 perceived side effects to Lipitor very long discussion held. Highly unlikely patient symptom she is noting are related to discuss important for patient of note is because of high risk of stroke because #3 #3 history of TIA #4 hypertension good control discussed plan diet exercise discussed. Medications refilled. Her blood  work

## 2016-06-22 ENCOUNTER — Ambulatory Visit: Payer: 59 | Admitting: Gastroenterology

## 2016-06-22 ENCOUNTER — Encounter: Payer: Self-pay | Admitting: Gastroenterology

## 2016-06-22 LAB — CBC WITH DIFFERENTIAL/PLATELET
Basophils Absolute: 0 10*3/uL (ref 0.0–0.2)
Basos: 0 %
EOS (ABSOLUTE): 0.1 10*3/uL (ref 0.0–0.4)
EOS: 2 %
HEMATOCRIT: 39.4 % (ref 34.0–46.6)
HEMOGLOBIN: 12.6 g/dL (ref 11.1–15.9)
Immature Grans (Abs): 0 10*3/uL (ref 0.0–0.1)
Immature Granulocytes: 0 %
LYMPHS ABS: 1.9 10*3/uL (ref 0.7–3.1)
LYMPHS: 39 %
MCH: 30.1 pg (ref 26.6–33.0)
MCHC: 32 g/dL (ref 31.5–35.7)
MCV: 94 fL (ref 79–97)
MONOCYTES: 11 %
Monocytes Absolute: 0.6 10*3/uL (ref 0.1–0.9)
NEUTROS ABS: 2.3 10*3/uL (ref 1.4–7.0)
Neutrophils: 48 %
Platelets: 265 10*3/uL (ref 150–379)
RBC: 4.18 x10E6/uL (ref 3.77–5.28)
RDW: 14.4 % (ref 12.3–15.4)
WBC: 4.9 10*3/uL (ref 3.4–10.8)

## 2016-06-22 LAB — LIPID PANEL
Chol/HDL Ratio: 2.5 ratio units (ref 0.0–4.4)
Cholesterol, Total: 134 mg/dL (ref 100–199)
HDL: 53 mg/dL (ref >39)
LDL Calculated: 66 mg/dL (ref 0–99)
TRIGLYCERIDES: 73 mg/dL (ref 0–149)
VLDL CHOLESTEROL CAL: 15 mg/dL (ref 5–40)

## 2016-06-22 LAB — BASIC METABOLIC PANEL
BUN / CREAT RATIO: 17 (ref 9–23)
BUN: 18 mg/dL (ref 6–24)
CALCIUM: 8.9 mg/dL (ref 8.7–10.2)
CO2: 24 mmol/L (ref 18–29)
CREATININE: 1.09 mg/dL — AB (ref 0.57–1.00)
Chloride: 99 mmol/L (ref 96–106)
GFR calc Af Amer: 66 mL/min/{1.73_m2} (ref >59)
GFR calc non Af Amer: 57 mL/min/{1.73_m2} — ABNORMAL LOW (ref >59)
Glucose: 91 mg/dL (ref 65–99)
Potassium: 3.8 mmol/L (ref 3.5–5.2)
Sodium: 140 mmol/L (ref 134–144)

## 2016-06-22 LAB — HEPATITIS C ANTIBODY: Hep C Virus Ab: <0.1 s/co ratio (ref 0.0–0.9)

## 2016-06-24 ENCOUNTER — Ambulatory Visit (HOSPITAL_COMMUNITY): Payer: 59

## 2016-06-25 ENCOUNTER — Ambulatory Visit (INDEPENDENT_AMBULATORY_CARE_PROVIDER_SITE_OTHER): Payer: 59 | Admitting: Family Medicine

## 2016-06-25 ENCOUNTER — Other Ambulatory Visit: Payer: Self-pay | Admitting: Family Medicine

## 2016-06-25 ENCOUNTER — Encounter: Payer: Self-pay | Admitting: Family Medicine

## 2016-06-25 VITALS — BP 120/70 | Temp 98.1°F | Ht 62.0 in | Wt 231.2 lb

## 2016-06-25 DIAGNOSIS — J019 Acute sinusitis, unspecified: Secondary | ICD-10-CM

## 2016-06-25 MED ORDER — AMOXICILLIN-POT CLAVULANATE 875-125 MG PO TABS: 1.0000 | ORAL_TABLET | Freq: Two times a day (BID) | ORAL | 0 refills | Status: DC

## 2016-06-25 NOTE — Progress Notes (Signed)
   Subjective:    Patient ID: Jo Hale, female    DOB: December 19, 1960, 56 y.o.   MRN: [id number]  Cough  This is a new problem. The current episode started in the past 7 days. Associated symptoms include a sore throat.  Viral like illness multiple days head congestion drainage coughing now with sinus pressure congestion no wheezing or difficulty breathing PMH benign no high fever chills or shortness of breath  Patient states no other concerns this visit.   Review of Systems  HENT: Positive for sore throat.   Respiratory: Positive for cough.        Objective:   Physical Exam  Cough noted not respiratory distress HEENT benign lungs clear heart regular    Assessment & Plan:  Viral syndrome Secondary rhinosinusitis Antibiotic prescribed warning signs discussed follow-up if problems

## 2016-06-27 ENCOUNTER — Encounter: Payer: Self-pay | Admitting: Family Medicine

## 2016-07-01 ENCOUNTER — Ambulatory Visit (HOSPITAL_COMMUNITY)
Admission: RE | Admit: 2016-07-01 | Discharge: 2016-07-01 | Disposition: A | Discharge status: Home / Self Care | Payer: 59 | Source: Ambulatory Visit | Attending: Adult Health | Admitting: Adult Health

## 2016-07-01 DIAGNOSIS — Z1231 Encounter for screening mammogram for malignant neoplasm of breast: Secondary | ICD-10-CM | POA: Insufficient documentation

## 2016-07-07 ENCOUNTER — Other Ambulatory Visit: Payer: Self-pay | Admitting: *Deleted

## 2016-07-07 ENCOUNTER — Telehealth: Payer: Self-pay | Admitting: Family Medicine

## 2016-07-07 MED ORDER — CEFDINIR 300 MG PO CAPS: 300.0000 mg | ORAL_CAPSULE | Freq: Two times a day (BID) | ORAL | 0 refills | Status: DC

## 2016-07-07 NOTE — Telephone Encounter (Signed)
Patient was seen on 06/25/16 by Dr. Nicki Reaper for acute rhinosinusitis.  She was prescribed amoxicillin.  She is still having congestion, coughing up greenish mucous, sinus issues.  No fever or wheezing. She wants to know if we can call in another antibiotic.  [employer]

## 2016-07-07 NOTE — Telephone Encounter (Signed)
omnicef 300 bid ten d 

## 2016-07-07 NOTE — Telephone Encounter (Signed)
rx sent to pharm. Pt notified.  

## 2016-07-13 ENCOUNTER — Ambulatory Visit: Payer: 59 | Admitting: Gastroenterology

## 2016-07-22 ENCOUNTER — Ambulatory Visit: Payer: 59 | Admitting: Gastroenterology

## 2016-07-26 ENCOUNTER — Encounter: Payer: Self-pay | Admitting: Family Medicine

## 2016-07-26 ENCOUNTER — Ambulatory Visit (INDEPENDENT_AMBULATORY_CARE_PROVIDER_SITE_OTHER): Payer: 59 | Admitting: Family Medicine

## 2016-07-26 VITALS — BP 132/70 | Temp 98.0°F | Ht 62.0 in | Wt 235.0 lb

## 2016-07-26 DIAGNOSIS — J310 Chronic rhinitis: Secondary | ICD-10-CM

## 2016-07-26 DIAGNOSIS — J329 Chronic sinusitis, unspecified: Secondary | ICD-10-CM

## 2016-07-26 MED ORDER — CEFPROZIL 500 MG PO TABS: 500.0000 mg | ORAL_TABLET | Freq: Two times a day (BID) | ORAL | 0 refills | Status: DC

## 2016-07-26 NOTE — Progress Notes (Signed)
   Subjective:    Patient ID: Jo Hale, female    DOB: 08/07/1960, 56 y.o.   MRN: [id number]  Sinusitis  This is a new problem. Episode onset: 2 days. Associated symptoms include coughing and a sore throat. Treatments tried: claritin.   Pt notes scratchy throat and strangly cough   Started yesterday and hit hard  Taking claritin prn   Usually does not get allergyillness  Yellow gunky  ++++ Frontal pressure comes and goes mod severe  Worse with cough      Review of Systems  HENT: Positive for sore throat.   Respiratory: Positive for cough.    No fever      Objective:   Physical Exam  Rp Alert, mild malaise. Hydration good Vitals stable. frontal/ maxillary tenderness evident positive nasal congestion. pharynx normal neck supple  lungs clear/no crackles or wheezes. heart regular in rhythm       Assessment & Plan:  Impression rhinosinusitis likely post viral, discussed with patient. plan antibiotics prescribed. Questions answered. Symptomatic care discussed. warning signs discussed. WSL Recurrent sinusitis disc at length and safe dranage rx for htn individuals disc

## 2016-07-29 ENCOUNTER — Telehealth: Payer: Self-pay | Admitting: Family Medicine

## 2016-07-29 MED ORDER — HYDROCODONE-HOMATROPINE 5-1.5 MG/5ML PO SYRP: ORAL_SOLUTION | ORAL | 0 refills | Status: DC

## 2016-07-29 NOTE — Telephone Encounter (Signed)
Patient just seen. Having problems with cough at night and wants to know if we will call in some cough medicine to [facility]. Pt's # [id number].

## 2016-07-29 NOTE — Telephone Encounter (Signed)
Spoke with patient and informed her per Dr.Steve Luking- She may have a prescription for Hycodan. Patient verbalized understanding.

## 2016-07-29 NOTE — Telephone Encounter (Signed)
Hycodan three oz one tspn qhs prn cough

## 2016-08-12 ENCOUNTER — Telehealth: Payer: Self-pay | Admitting: Family Medicine

## 2016-08-12 MED ORDER — AMOXICILLIN-POT CLAVULANATE 875-125 MG PO TABS: 1.0000 | ORAL_TABLET | Freq: Two times a day (BID) | ORAL | 0 refills | Status: AC

## 2016-08-12 NOTE — Telephone Encounter (Signed)
Spoke with patient and informed her per Dr.Steve Luking- we are sending in Augmentin 875. Patient verbalized understanding.

## 2016-08-12 NOTE — Telephone Encounter (Signed)
Patient was seen on 07/26/16 for rhinosinusitis by Dr. Richardson Landry.  She said that she is still spitting up a little cloudy, thick green mucous, no cough.  She is requesting additional Rx called in.   [employer]

## 2016-08-12 NOTE — Telephone Encounter (Signed)
Aug 875 bid ten d 

## 2016-08-31 ENCOUNTER — Encounter: Payer: Self-pay | Admitting: Adult Health

## 2016-08-31 ENCOUNTER — Ambulatory Visit (INDEPENDENT_AMBULATORY_CARE_PROVIDER_SITE_OTHER): Payer: 59 | Admitting: Adult Health

## 2016-08-31 VITALS — BP 110/60 | HR 70 | Ht 63.0 in | Wt 229.0 lb

## 2016-08-31 DIAGNOSIS — Z01419 Encounter for gynecological examination (general) (routine) without abnormal findings: Secondary | ICD-10-CM

## 2016-08-31 DIAGNOSIS — Z1212 Encounter for screening for malignant neoplasm of rectum: Secondary | ICD-10-CM

## 2016-08-31 DIAGNOSIS — B369 Superficial mycosis, unspecified: Secondary | ICD-10-CM | POA: Insufficient documentation

## 2016-08-31 DIAGNOSIS — Z1211 Encounter for screening for malignant neoplasm of colon: Secondary | ICD-10-CM | POA: Diagnosis not present

## 2016-08-31 LAB — HEMOCCULT GUIAC POC 1CARD (OFFICE): Fecal Occult Blood, POC: NEGATIVE

## 2016-08-31 MED ORDER — NYSTATIN 100000 UNIT/GM EX POWD: Freq: Three times a day (TID) | CUTANEOUS | 3 refills | Status: DC

## 2016-08-31 MED ORDER — FLUCONAZOLE 100 MG PO TABS: ORAL_TABLET | ORAL | 0 refills | Status: DC

## 2016-08-31 NOTE — Progress Notes (Signed)
Patient ID: Jo Hale, female   DOB: [birth year], 56 y.o.   MRN: [id number] History of Present Illness: Jo Hale is a 56 year old black female in for well woman gyn exam, pap was normal with negative HPV 08/29/15. Has irritation in groin (has been on antibiotics). She is sp [facility].  PCP is Jo Hale.    Current Medications, Allergies, Past Medical History, Past Surgical History, Family History and Social History were reviewed in [employer] record.     Review of Systems:  Patient denies any headaches, hearing loss, fatigue, blurred vision, shortness of breath, chest pain, abdominal pain, problems with bowel movements, urination, or intercourse(not currently having sex). No joint pain or mood swings.+irritation in groin.   Physical Exam:BP 110/60 (BP Location: Left Arm, Patient Position: Sitting, Cuff Size: Large)   Pulse 70   Ht 5\' 3"  (1.6 m)   Wt 229 lb (103.9 kg)   LMP 02/17/2011   BMI 40.57 kg/m  General:  Well developed, well nourished, no acute distress Skin:  Warm and dry Neck:  Midline trachea, normal thyroid, good ROM, no lymphadenopathy Lungs; Clear to auscultation bilaterally Breast:  No dominant palpable mass, retraction, or nipple discharge Cardiovascular: Regular rate and rhythm Abdomen:  Soft, non tender, no hepatosplenomegaly Pelvic:  External genitalia is normal in appearance, no lesions.But in groin skin color and texture changes, looks like skin fungus.  The vagina is normal in appearance. Urethra has no lesions or masses. The cervix is bulbous, and smooth.  Uterus is absent.  No adnexal masses or tenderness noted.Bladder is non tender, no masses felt. Rectal: Good sphincter tone, no polyps, or hemorrhoids felt.  Hemoccult negative. Extremities/musculoskeletal:  No swelling or varicosities noted, no clubbing or cyanosis Psych:  No mood changes, alert and cooperative,seems happy PHQ 2 score 0.  Impression: 1. Well woman exam with routine  gynecological exam   2. Screening for colorectal cancer   3. Superficial fungus infection of skin       Plan: Meds ordered this encounter  Medications  . fluconazole (DIFLUCAN) 100 MG tablet    Sig: Take 1 daily for 10 days    Dispense:  10 tablet    Refill:  0    Order Specific Question:   Supervising Provider    Answer:   Jo Hale, Jo Hale [[id number]]  . nystatin (MYCOSTATIN/NYSTOP) powder    Sig: Apply topically 3 (three) times daily.    Dispense:  15 g    Refill:  3    Order Specific Question:   Supervising Provider    Answer:   Jo Hale, Jo Hale [[id number]]  Keep dry, try hi cut panties Physical in 1 year,pap in 2020 Mammogram yearly Labs with PCP Colonoscopy per GI

## 2016-09-23 ENCOUNTER — Ambulatory Visit: Payer: 59 | Admitting: Family Medicine

## 2016-12-04 ENCOUNTER — Other Ambulatory Visit: Payer: Self-pay | Admitting: Family Medicine

## 2016-12-23 ENCOUNTER — Ambulatory Visit (INDEPENDENT_AMBULATORY_CARE_PROVIDER_SITE_OTHER): Payer: 59 | Admitting: Family Medicine

## 2016-12-23 ENCOUNTER — Encounter: Payer: Self-pay | Admitting: Family Medicine

## 2016-12-23 VITALS — BP 130/84 | Ht 62.0 in | Wt 234.1 lb

## 2016-12-23 DIAGNOSIS — I10 Essential (primary) hypertension: Secondary | ICD-10-CM | POA: Diagnosis not present

## 2016-12-23 DIAGNOSIS — E785 Hyperlipidemia, unspecified: Secondary | ICD-10-CM

## 2016-12-23 DIAGNOSIS — M1611 Unilateral primary osteoarthritis, right hip: Secondary | ICD-10-CM

## 2016-12-23 DIAGNOSIS — R5383 Other fatigue: Secondary | ICD-10-CM | POA: Diagnosis not present

## 2016-12-23 MED ORDER — PANTOPRAZOLE SODIUM 40 MG PO TBEC: 40.0000 mg | DELAYED_RELEASE_TABLET | Freq: Every day | ORAL | 5 refills | Status: DC

## 2016-12-23 MED ORDER — TRIAMTERENE-HCTZ 37.5-25 MG PO TABS: 1.0000 | ORAL_TABLET | Freq: Every day | ORAL | 5 refills | Status: DC

## 2016-12-23 MED ORDER — ATORVASTATIN CALCIUM 20 MG PO TABS
20.0000 mg | ORAL_TABLET | Freq: Every day | ORAL | 5 refills | Status: DC
Start: 2016-12-23 — End: 2017-06-21

## 2016-12-23 NOTE — Progress Notes (Signed)
   Subjective:    Patient ID: Jo Hale, female    DOB: 03-29-1961, 56 y.o.   MRN: [id number]  Hypertension  This is a chronic problem. The current episode started more than 1 year ago.   Patient has concerns of low back and right hip pain. Also has concerns of dry mouth.   Blood pressure medicine and blood pressure levels reviewed today with patient. Compliant with blood pressure medicine. States does not miss a dose. No obvious side effects. Blood pressure generally good when checked elsewhere. Watching salt intake.   Patient continues to take lipid medication regularly. No obvious side effects from it. Generally does not miss a dose. Prior blood work results are reviewed with patient. Patient continues to work on fat intake in diet   Diet overall is pretty good, eats healthy, has cut down on carbs   not exercising much with hip  Pos pain with hip, ongoing and now more painful, not taking any ibu at this time  ibu seems to cause pain sharp   Patient also presents saying that she is having pain "in her heart". Points to left anterior chest. Notes that sharp. Transient. Last a few seconds. Usually with movement. Or deep breath. 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   Review of Systems N o headache, no major weight loss or weight gain, no chest pain no back pain abdominal pain no change in bowel habits complete ROS otherwise negative     Objective:   Physical Exam  Alert and oriented, vitals reviewed and stable, NAD ENT-TM's and ext canals WNL bilat via otoscopic exam Soft palate, tonsils and post pharynx WNL via oropharyngeal exam Neck-symmetric, no masses; thyroid nonpalpable and nontender Pulmonary-no tachypnea or accessory muscle use; Clear wi    impression #1  hypertension good control discussed to continue same meds  #2 hyperlipidemia prior blood work reviewed. 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 Time for patient to see orthopedic surgeon discussed. Would prefer to see one in [place].  #4 chest pain definite not cardiac/likely and chest wall rationale discussed with patient. Was completely normal chest wall and cardiac and ulnar exam recommend no major workup at this time. Plan as noted above. We will work on orthopedic referral. All medications refilled. Appropriate blood work. Further recommendations based results.       Card--no abnrml murmurs, rhythm reg and rate WNL Carotid pulses symmetric, without bruits  Pos hip pain with rotation, knees bilat crepitations     Assessment & Plan:  See above/ c Greater than 50% of this 40 minute face to face visit was spent in counseling and discussion and coordination of care regarding the above diagnosis/diagnosies

## 2016-12-24 LAB — HEPATIC FUNCTION PANEL
ALBUMIN: 4.1 g/dL (ref 3.5–5.5)
ALK PHOS: 100 IU/L (ref 39–117)
ALT: 33 IU/L — ABNORMAL HIGH (ref 0–32)
AST: 28 IU/L (ref 0–40)
BILIRUBIN TOTAL: 0.5 mg/dL (ref 0.0–1.2)
Bilirubin, Direct: 0.13 mg/dL (ref 0.00–0.40)
TOTAL PROTEIN: 7.1 g/dL (ref 6.0–8.5)

## 2016-12-24 LAB — LIPID PANEL
Chol/HDL Ratio: 2.5 ratio (ref 0.0–4.4)
Cholesterol, Total: 148 mg/dL (ref 100–199)
HDL: 60 mg/dL (ref >39)
LDL Calculated: 72 mg/dL (ref 0–99)
Triglycerides: 80 mg/dL (ref 0–149)
VLDL Cholesterol Cal: 16 mg/dL (ref 5–40)

## 2016-12-24 LAB — BASIC METABOLIC PANEL
BUN/Creatinine Ratio: 14 (ref 9–23)
BUN: 16 mg/dL (ref 6–24)
CO2: 26 mmol/L (ref 20–29)
CREATININE: 1.18 mg/dL — AB (ref 0.57–1.00)
Calcium: 9.6 mg/dL (ref 8.7–10.2)
Chloride: 104 mmol/L (ref 96–106)
GFR, EST AFRICAN AMERICAN: 60 mL/min/{1.73_m2} (ref >59)
GFR, EST NON AFRICAN AMERICAN: 52 mL/min/{1.73_m2} — AB (ref >59)
Glucose: 98 mg/dL (ref 65–99)
POTASSIUM: 3.6 mmol/L (ref 3.5–5.2)
SODIUM: 143 mmol/L (ref 134–144)

## 2016-12-24 LAB — TSH: TSH: 2.14 u[IU]/mL (ref 0.450–4.500)

## 2016-12-25 ENCOUNTER — Encounter: Payer: Self-pay | Admitting: Family Medicine

## 2016-12-29 ENCOUNTER — Encounter: Payer: Self-pay | Admitting: Family Medicine

## 2017-01-12 DIAGNOSIS — M5416 Radiculopathy, lumbar region: Secondary | ICD-10-CM | POA: Diagnosis not present

## 2017-01-20 DIAGNOSIS — M5410 Radiculopathy, site unspecified: Secondary | ICD-10-CM | POA: Diagnosis not present

## 2017-01-31 DIAGNOSIS — M5410 Radiculopathy, site unspecified: Secondary | ICD-10-CM | POA: Diagnosis not present

## 2017-01-31 DIAGNOSIS — M47816 Spondylosis without myelopathy or radiculopathy, lumbar region: Secondary | ICD-10-CM | POA: Diagnosis not present

## 2017-02-08 ENCOUNTER — Other Ambulatory Visit: Payer: Self-pay | Admitting: Physical Medicine and Rehabilitation

## 2017-02-09 ENCOUNTER — Other Ambulatory Visit: Payer: Self-pay | Admitting: Physical Medicine and Rehabilitation

## 2017-02-09 DIAGNOSIS — N289 Disorder of kidney and ureter, unspecified: Secondary | ICD-10-CM

## 2017-02-11 ENCOUNTER — Ambulatory Visit
Admission: RE | Admit: 2017-02-11 | Discharge: 2017-02-11 | Disposition: A | Discharge status: Home / Self Care | Payer: 59 | Source: Ambulatory Visit | Attending: Physical Medicine and Rehabilitation | Admitting: Physical Medicine and Rehabilitation

## 2017-02-11 DIAGNOSIS — N289 Disorder of kidney and ureter, unspecified: Secondary | ICD-10-CM

## 2017-02-11 DIAGNOSIS — R1900 Intra-abdominal and pelvic swelling, mass and lump, unspecified site: Secondary | ICD-10-CM | POA: Diagnosis not present

## 2017-02-11 MED ORDER — IOPAMIDOL (ISOVUE-300) INJECTION 61%
125.0000 mL | Freq: Once | INTRAVENOUS | Status: AC | PRN
  Administered 2017-02-11: 125 mL via INTRAVENOUS

## 2017-02-14 DIAGNOSIS — Z029 Encounter for administrative examinations, unspecified: Secondary | ICD-10-CM

## 2017-03-15 ENCOUNTER — Other Ambulatory Visit (HOSPITAL_COMMUNITY): Payer: Self-pay | Admitting: Urology

## 2017-03-15 ENCOUNTER — Ambulatory Visit (HOSPITAL_COMMUNITY)
Admission: RE | Admit: 2017-03-15 | Discharge: 2017-03-15 | Disposition: A | Discharge status: Home / Self Care | Payer: 59 | Source: Ambulatory Visit | Attending: Urology | Admitting: Urology

## 2017-03-15 DIAGNOSIS — D49511 Neoplasm of unspecified behavior of right kidney: Secondary | ICD-10-CM | POA: Insufficient documentation

## 2017-03-15 DIAGNOSIS — I10 Essential (primary) hypertension: Secondary | ICD-10-CM | POA: Diagnosis not present

## 2017-03-15 DIAGNOSIS — D49519 Neoplasm of unspecified behavior of unspecified kidney: Secondary | ICD-10-CM

## 2017-03-28 ENCOUNTER — Other Ambulatory Visit: Payer: Self-pay | Admitting: Urology

## 2017-04-14 ENCOUNTER — Other Ambulatory Visit (HOSPITAL_COMMUNITY): Payer: 59

## 2017-04-22 DIAGNOSIS — D49511 Neoplasm of unspecified behavior of right kidney: Secondary | ICD-10-CM | POA: Diagnosis not present

## 2017-05-10 NOTE — Progress Notes (Signed)
cxr 03-15-17 epic  lov Dr Baltazar Apo 12-23-16 epic  ECHO 07-03-15 Epic

## 2017-05-10 NOTE — Patient Instructions (Addendum)
Jo Hale  05/10/2017    Your procedure is scheduled on: 05-16-17    Report to [facility] Main  Entrance    Report to admitting at 8:45AM   Call this number if you have problems the morning of surgery [phone]     Remember: Do not eat food or drink liquids :After Midnight.     Take these medicines the morning of surgery with A SIP OF WATER: atorvastatin, pantoprazole, nasal spray if needed                                You may not have any metal on your body including hair pins and              piercings  Do not wear jewelry, make-up, lotions, powders or perfumes, deodorant             Do not wear nail polish.  Do not shave  48 hours prior to surgery.     Do not bring valuables to the hospital. [place].  Contacts, dentures or bridgework may not be worn into surgery.  Leave suitcase in the car. After surgery it may be brought to your room.     Patients discharged the day of surgery will not be allowed to drive home.  Name and phone number of your driver:  Special Instructions: N/A              Please read over the following fact sheets you were given: _____________________________________________________________________             [facility] - Preparing for Surgery Before surgery, you can play an important role.  Because skin is not sterile, your skin needs to be as free of germs as possible.  You can reduce the number of germs on your skin by washing with CHG (chlorahexidine gluconate) soap before surgery.  CHG is an antiseptic cleaner which kills germs and bonds with the skin to continue killing germs even after washing. Please DO NOT use if you have an allergy to CHG or antibacterial soaps.  If your skin becomes reddened/irritated stop using the CHG and inform your nurse when you arrive at Short Stay. Do not shave (including legs and underarms) for at least 48 hours prior to the first  CHG shower.  You may shave your face/neck. Please follow these instructions carefully:  1.  Shower with CHG Soap the night before surgery and the  morning of Surgery.  2.  If you choose to wash your hair, wash your hair first as usual with your  normal  shampoo.  3.  After you shampoo, rinse your hair and body thoroughly to remove the  shampoo.                           4.  Use CHG as you would any other liquid soap.  You can apply chg directly  to the skin and wash                       Gently with a scrungie or clean washcloth.  5.  Apply the CHG Soap to your body ONLY FROM THE NECK DOWN.  Do not use on face/ open                           Wound or open sores. Avoid contact with eyes, ears mouth and genitals (private parts).                       Wash face,  Genitals (private parts) with your normal soap.             6.  Wash thoroughly, paying special attention to the area where your surgery  will be performed.  7.  Thoroughly rinse your body with warm water from the neck down.  8.  DO NOT shower/wash with your normal soap after using and rinsing off  the CHG Soap.                9.  Pat yourself dry with a clean towel.            10.  Wear clean pajamas.            11.  Place clean sheets on your bed the night of your first shower and do not  sleep with pets. Day of Surgery : Do not apply any lotions/deodorants the morning of surgery.  Please wear clean clothes to the hospital/surgery center.  FAILURE TO FOLLOW THESE INSTRUCTIONS MAY RESULT IN THE CANCELLATION OF YOUR SURGERY PATIENT SIGNATURE_________________________________  NURSE SIGNATURE__________________________________  ________________________________________________________________________

## 2017-05-11 ENCOUNTER — Other Ambulatory Visit: Payer: Self-pay

## 2017-05-11 ENCOUNTER — Encounter (HOSPITAL_COMMUNITY): Payer: Self-pay

## 2017-05-11 ENCOUNTER — Encounter (HOSPITAL_COMMUNITY)
Admission: RE | Admit: 2017-05-11 | Discharge: 2017-05-11 | Disposition: A | Discharge status: Home / Self Care | Payer: 59 | Source: Ambulatory Visit | Attending: Urology | Admitting: Urology

## 2017-05-11 DIAGNOSIS — I10 Essential (primary) hypertension: Secondary | ICD-10-CM | POA: Insufficient documentation

## 2017-05-11 DIAGNOSIS — Z01810 Encounter for preprocedural cardiovascular examination: Secondary | ICD-10-CM | POA: Insufficient documentation

## 2017-05-11 DIAGNOSIS — Z01812 Encounter for preprocedural laboratory examination: Secondary | ICD-10-CM | POA: Diagnosis not present

## 2017-05-11 HISTORY — DX: Nausea with vomiting, unspecified: Z98.890

## 2017-05-11 HISTORY — DX: Transient cerebral ischemic attack, unspecified: G45.9

## 2017-05-11 HISTORY — DX: Neoplasm of unspecified behavior of unspecified kidney: D49.519

## 2017-05-11 HISTORY — DX: Nausea with vomiting, unspecified: R11.2

## 2017-05-11 LAB — CBC
HCT: 37.7 % (ref 36.0–46.0)
Hemoglobin: 12.8 g/dL (ref 12.0–15.0)
MCH: 31.6 pg (ref 26.0–34.0)
MCHC: 34 g/dL (ref 30.0–36.0)
MCV: 93.1 fL (ref 78.0–100.0)
PLATELETS: 236 10*3/uL (ref 150–400)
RBC: 4.05 MIL/uL (ref 3.87–5.11)
RDW: 13.8 % (ref 11.5–15.5)
WBC: 4.5 10*3/uL (ref 4.0–10.5)

## 2017-05-11 LAB — BASIC METABOLIC PANEL
Anion gap: 5 (ref 5–15)
BUN: 16 mg/dL (ref 6–20)
CALCIUM: 9.1 mg/dL (ref 8.9–10.3)
CO2: 29 mmol/L (ref 22–32)
CREATININE: 1.06 mg/dL — AB (ref 0.44–1.00)
Chloride: 109 mmol/L (ref 101–111)
GFR calc Af Amer: >60 mL/min (ref >60)
GFR calc non Af Amer: 58 mL/min — ABNORMAL LOW (ref >60)
GLUCOSE: 98 mg/dL (ref 65–99)
Potassium: 3.9 mmol/L (ref 3.5–5.1)
Sodium: 143 mmol/L (ref 135–145)

## 2017-05-11 NOTE — Progress Notes (Signed)
Type and screen done at pre-op today negative for antibodies. Per Sherlynn Stalls in [place] blood bank , Mrs Clouse has "historical antibodies seen on a type and screen collected years ago at [facility] so she does not qualify for a 14 day type and screen" i.e.  New type and screen must be collected day of surgery. RN will order new for day of surgery and indicate need on front sheet for short stay awareness.

## 2017-05-13 DIAGNOSIS — C641 Malignant neoplasm of right kidney, except renal pelvis: Secondary | ICD-10-CM

## 2017-05-13 HISTORY — DX: Malignant neoplasm of right kidney, except renal pelvis: C64.1

## 2017-05-13 NOTE — H&P (Signed)
CC/HPI: CC: Right renal neoplasm  Physician requesting consult: Dr. Suella Broad  Primary care physician:   Jo Hale is a 57 year old female with a history of chronic low back pain. She was recently undergoing an evaluation by Dr. Nelva Bush and an MRI of the lumbar spine indicated a hypointense area of the right kidney. A subsequent dedicated CT of the abdomen with and without IV contrast confirmed a 2.1 x 1.9 cm exophytic mass off the right mid kidney that was enhancing and concerning for renal malignancy.   Family history of kidney cancer: None.  Family history of ESRD: None.   Imaging: CT scan (02/11/17)  Side of renal neoplasm: Right  Size of renal neoplasm: 2.1 cm  Location of renal neoplasm: Right mid kidney  Exophytic or endophytic: Exophytic   Renal artery anatomy: Single renal artery  Renal vein anatomy: Single renal vein   Contralateral renal lesions:  Regional lymphadenopathy: None.  Adrenal masses: None.  Renal vein/IVC involvement: No.  Metastatic disease to the abdomen: No.   Chest imaging: PENDING  LFTs: PENDING   Baseline renal function: Cr , eGFR ml/min   PMH: Past medical history is significant for obesity, hyperlipidemia, GERD, hypertension, and a TIA (managed with ASA 81 mg).  PSH: Partial hysterectomy via Pfannensteil incision.     ALLERGIES: None   MEDICATIONS: Aspirin  Aleve  Atorvastatin Calcium  Benadryl  Pantoprazole Sodium  Triamterene-Hydrochlorothiazid     GU PSH: None   NON-GU PSH: Partial Hysterectomy    GU PMH: None   NON-GU PMH: Arthritis Asthma GERD Hypercholesterolemia Hypertension Stroke/TIA    FAMILY HISTORY: 1 Daughter - Runs in Family 1 son - Runs in Family Congestive Heart Failure - Father Dementia - Mother   SOCIAL HISTORY: Marital Status: Married Preferred Language: English; Ethnicity: Not Hispanic Or Latino; Race: Black or African American Current Smoking Status: Patient has never smoked.   Tobacco  Use Assessment Completed: Used Tobacco in last 30 days? Does not drink anymore.  Drinks 1 caffeinated drink per day.    REVIEW OF SYSTEMS:    GU Review Female:   Patient reports frequent urination, hard to postpone urination, get up at night to urinate, and leakage of urine. Patient denies burning /pain with urination, stream starts and stops, trouble starting your stream, have to strain to urinate, and currently pregnant.  Gastrointestinal (Lower):   Patient denies diarrhea and constipation.  Gastrointestinal (Upper):   Patient denies nausea and vomiting.  Constitutional:   Patient denies fever, night sweats, weight loss, and fatigue.  Skin:   Patient reports skin rash/ lesion. Patient denies itching.  Eyes:   Patient denies blurred vision and double vision.  Ears/ Nose/ Throat:   Patient denies sore throat and sinus problems.  Hematologic/Lymphatic:   Patient denies swollen glands and easy bruising.  Cardiovascular:   Patient reports chest pains. Patient denies leg swelling.  Respiratory:   Patient denies shortness of breath and cough.  Endocrine:   Patient denies excessive thirst.  Musculoskeletal:   Patient reports back pain and joint pain.   Neurological:   Patient denies headaches and dizziness.  Psychologic:   Patient denies depression and anxiety.   VITAL SIGNS:     Weight 230 lb / 104.33 kg  Height 62 in / 157.48 cm  BMI 42.1 kg/m   MULTI-SYSTEM PHYSICAL EXAMINATION:    Constitutional: Well-nourished. No physical deformities. Normally developed. Good grooming.  Neck: Neck symmetrical, not swollen. Normal tracheal position.  Respiratory:  No labored breathing, no use of accessory muscles. Clear bilaterally.  Cardiovascular: Normal temperature, normal extremity pulses, no swelling, no varicosities. Regular rate and rhythm.  Lymphatic: No enlargement of neck, axillae, groin.  Skin: No paleness, no jaundice, no cyanosis. No lesion, no ulcer, no rash.  Neurologic / Psychiatric:  Oriented to time, oriented to place, oriented to person. No depression, no anxiety, no agitation.  Gastrointestinal: Obese, nontender, no masses. Well-healed Pfannenstiel incision.  Eyes: Normal conjunctivae. Normal eyelids.  Ears, Nose, Mouth, and Throat: Left ear no scars, no lesions, no masses. Right ear no scars, no lesions, no masses. Nose no scars, no lesions, no masses. Normal hearing. Normal lips.  Musculoskeletal: Normal gait and station of head and neck.      ASSESSMENT:      ICD-10 Details  1 GU:   Right renal neoplasm - D49.511    PLAN:        1. Right renal neoplasm concerning for malignancy:I recommended that she proceed with surgical intervention with a minimally invasive nephron sparing surgery. She will therefore be scheduled for a right robot-assisted laparoscopic partial nephrectomy.

## 2017-05-16 ENCOUNTER — Encounter (HOSPITAL_COMMUNITY)
Admission: RE | Disposition: A | Discharge status: Home / Self Care | Payer: Self-pay | Source: Ambulatory Visit | Attending: Urology

## 2017-05-16 ENCOUNTER — Other Ambulatory Visit: Payer: Self-pay

## 2017-05-16 ENCOUNTER — Inpatient Hospital Stay (HOSPITAL_COMMUNITY): Payer: 59 | Admitting: Anesthesiology

## 2017-05-16 ENCOUNTER — Encounter (HOSPITAL_COMMUNITY): Payer: Self-pay | Admitting: Emergency Medicine

## 2017-05-16 ENCOUNTER — Inpatient Hospital Stay (HOSPITAL_COMMUNITY)
Admission: RE | Admit: 2017-05-16 | Discharge: 2017-05-18 | DRG: 657 | Disposition: A | Discharge status: Home / Self Care | Payer: 59 | Source: Ambulatory Visit | Attending: Urology | Admitting: Urology

## 2017-05-16 DIAGNOSIS — I10 Essential (primary) hypertension: Secondary | ICD-10-CM | POA: Diagnosis not present

## 2017-05-16 DIAGNOSIS — Z7982 Long term (current) use of aspirin: Secondary | ICD-10-CM | POA: Diagnosis not present

## 2017-05-16 DIAGNOSIS — E7800 Pure hypercholesterolemia, unspecified: Secondary | ICD-10-CM | POA: Diagnosis present

## 2017-05-16 DIAGNOSIS — E785 Hyperlipidemia, unspecified: Secondary | ICD-10-CM | POA: Diagnosis present

## 2017-05-16 DIAGNOSIS — Z6841 Body Mass Index (BMI) 40.0 and over, adult: Secondary | ICD-10-CM | POA: Diagnosis not present

## 2017-05-16 DIAGNOSIS — D49511 Neoplasm of unspecified behavior of right kidney: Principal | ICD-10-CM | POA: Diagnosis present

## 2017-05-16 DIAGNOSIS — Z8249 Family history of ischemic heart disease and other diseases of the circulatory system: Secondary | ICD-10-CM | POA: Diagnosis not present

## 2017-05-16 DIAGNOSIS — Z90710 Acquired absence of both cervix and uterus: Secondary | ICD-10-CM | POA: Diagnosis not present

## 2017-05-16 DIAGNOSIS — K219 Gastro-esophageal reflux disease without esophagitis: Secondary | ICD-10-CM | POA: Diagnosis not present

## 2017-05-16 DIAGNOSIS — C641 Malignant neoplasm of right kidney, except renal pelvis: Secondary | ICD-10-CM | POA: Diagnosis not present

## 2017-05-16 DIAGNOSIS — Z79891 Long term (current) use of opiate analgesic: Secondary | ICD-10-CM

## 2017-05-16 DIAGNOSIS — Z8673 Personal history of transient ischemic attack (TIA), and cerebral infarction without residual deficits: Secondary | ICD-10-CM

## 2017-05-16 DIAGNOSIS — S83242A Other tear of medial meniscus, current injury, left knee, initial encounter: Secondary | ICD-10-CM | POA: Diagnosis not present

## 2017-05-16 HISTORY — PX: ROBOT ASSISTED LAPAROSCOPIC NEPHRECTOMY: SHX5140

## 2017-05-16 LAB — HEMOGLOBIN AND HEMATOCRIT, BLOOD
HEMATOCRIT: 37.8 % (ref 36.0–46.0)
HEMOGLOBIN: 12.6 g/dL (ref 12.0–15.0)

## 2017-05-16 LAB — BASIC METABOLIC PANEL
ANION GAP: 9 (ref 5–15)
BUN: 12 mg/dL (ref 6–20)
CALCIUM: 8.4 mg/dL — AB (ref 8.9–10.3)
CO2: 27 mmol/L (ref 22–32)
CREATININE: 1.15 mg/dL — AB (ref 0.44–1.00)
Chloride: 103 mmol/L (ref 101–111)
GFR calc non Af Amer: 52 mL/min — ABNORMAL LOW (ref >60)
Glucose, Bld: 126 mg/dL — ABNORMAL HIGH (ref 65–99)
Potassium: 3.4 mmol/L — ABNORMAL LOW (ref 3.5–5.1)
SODIUM: 139 mmol/L (ref 135–145)

## 2017-05-16 LAB — TYPE AND SCREEN
ABO/RH(D): B POS
ABO/RH(D): B POS
ANTIBODY SCREEN: NEGATIVE
Antibody Screen: NEGATIVE

## 2017-05-16 SURGERY — NEPHRECTOMY, RADICAL, ROBOT-ASSISTED, LAPAROSCOPIC, ADULT
Anesthesia: General | Laterality: Right

## 2017-05-16 MED ORDER — PROPOFOL 10 MG/ML IV BOLUS
INTRAVENOUS | Status: AC
  Filled 2017-05-16: qty 20

## 2017-05-16 MED ORDER — MIDAZOLAM HCL 5 MG/5ML IJ SOLN
INTRAMUSCULAR | Status: DC | PRN
  Administered 2017-05-16: 2 mg via INTRAVENOUS

## 2017-05-16 MED ORDER — SODIUM CHLORIDE 0.9 % IJ SOLN
INTRAMUSCULAR | Status: DC | PRN
  Administered 2017-05-16: 20 mL

## 2017-05-16 MED ORDER — LACTATED RINGERS IV SOLN
INTRAVENOUS | Status: DC
  Administered 2017-05-16: 10:00:00 via INTRAVENOUS

## 2017-05-16 MED ORDER — HYDROMORPHONE HCL 1 MG/ML IJ SOLN
0.2500 mg | INTRAMUSCULAR | Status: DC | PRN
  Administered 2017-05-16 (×4): 0.5 mg via INTRAVENOUS

## 2017-05-16 MED ORDER — SODIUM CHLORIDE 0.9 % IJ SOLN
INTRAMUSCULAR | Status: AC
  Filled 2017-05-16: qty 20

## 2017-05-16 MED ORDER — DIPHENHYDRAMINE HCL 50 MG/ML IJ SOLN: 12.5000 mg | Freq: Four times a day (QID) | INTRAMUSCULAR | Status: DC | PRN

## 2017-05-16 MED ORDER — HYDROCODONE-ACETAMINOPHEN 5-325 MG PO TABS: 1.0000 | ORAL_TABLET | Freq: Four times a day (QID) | ORAL | 0 refills | Status: DC | PRN

## 2017-05-16 MED ORDER — ACETAMINOPHEN 160 MG/5ML PO SOLN: 960.0000 mg | Freq: Once | ORAL | Status: DC

## 2017-05-16 MED ORDER — SUGAMMADEX SODIUM 200 MG/2ML IV SOLN
INTRAVENOUS | Status: DC | PRN
  Administered 2017-05-16: 250 mg via INTRAVENOUS

## 2017-05-16 MED ORDER — PROPOFOL 10 MG/ML IV BOLUS
INTRAVENOUS | Status: DC | PRN
  Administered 2017-05-16: 150 mg via INTRAVENOUS

## 2017-05-16 MED ORDER — LIDOCAINE 2% (20 MG/ML) 5 ML SYRINGE
INTRAMUSCULAR | Status: AC
  Filled 2017-05-16: qty 5

## 2017-05-16 MED ORDER — DOCUSATE SODIUM 100 MG PO CAPS
100.0000 mg | ORAL_CAPSULE | Freq: Two times a day (BID) | ORAL | Status: DC
  Administered 2017-05-16 – 2017-05-18 (×3): 100 mg via ORAL
  Filled 2017-05-16 (×4): qty 1

## 2017-05-16 MED ORDER — SCOPOLAMINE 1 MG/3DAYS TD PT72
MEDICATED_PATCH | TRANSDERMAL | Status: AC
  Filled 2017-05-16: qty 1

## 2017-05-16 MED ORDER — DEXTROSE-NACL 5-0.45 % IV SOLN
INTRAVENOUS | Status: DC
  Administered 2017-05-16 – 2017-05-17 (×2): via INTRAVENOUS

## 2017-05-16 MED ORDER — MIDAZOLAM HCL 5 MG/5ML IJ SOLN: INTRAMUSCULAR | Status: DC | PRN

## 2017-05-16 MED ORDER — HYDROMORPHONE HCL 1 MG/ML IJ SOLN
INTRAMUSCULAR | Status: AC
  Filled 2017-05-16: qty 2

## 2017-05-16 MED ORDER — ACETAMINOPHEN 10 MG/ML IV SOLN
1000.0000 mg | Freq: Four times a day (QID) | INTRAVENOUS | Status: DC
  Administered 2017-05-16 – 2017-05-17 (×3): 1000 mg via INTRAVENOUS
  Filled 2017-05-16 (×3): qty 100

## 2017-05-16 MED ORDER — BUPIVACAINE LIPOSOME 1.3 % IJ SUSP
20.0000 mL | Freq: Once | INTRAMUSCULAR | Status: AC
  Administered 2017-05-16: 20 mL
  Filled 2017-05-16: qty 20

## 2017-05-16 MED ORDER — DEXAMETHASONE SODIUM PHOSPHATE 10 MG/ML IJ SOLN
INTRAMUSCULAR | Status: DC | PRN
  Administered 2017-05-16: 10 mg via INTRAVENOUS

## 2017-05-16 MED ORDER — SUGAMMADEX SODIUM 500 MG/5ML IV SOLN
INTRAVENOUS | Status: AC
  Filled 2017-05-16: qty 5

## 2017-05-16 MED ORDER — SCOPOLAMINE 1 MG/3DAYS TD PT72
MEDICATED_PATCH | TRANSDERMAL | Status: DC | PRN
  Administered 2017-05-16: 1 via TRANSDERMAL

## 2017-05-16 MED ORDER — ONDANSETRON HCL 4 MG/2ML IJ SOLN
INTRAMUSCULAR | Status: AC
  Filled 2017-05-16: qty 2

## 2017-05-16 MED ORDER — TRIAMTERENE-HCTZ 37.5-25 MG PO TABS
1.0000 | ORAL_TABLET | Freq: Every day | ORAL | Status: DC
  Administered 2017-05-16 – 2017-05-18 (×3): 1 via ORAL
  Filled 2017-05-16 (×3): qty 1

## 2017-05-16 MED ORDER — ROCURONIUM BROMIDE 10 MG/ML (PF) SYRINGE
PREFILLED_SYRINGE | INTRAVENOUS | Status: AC
  Filled 2017-05-16: qty 5

## 2017-05-16 MED ORDER — HYDROMORPHONE HCL 2 MG/ML IJ SOLN
INTRAMUSCULAR | Status: AC
  Filled 2017-05-16: qty 1

## 2017-05-16 MED ORDER — ATORVASTATIN CALCIUM 20 MG PO TABS
20.0000 mg | ORAL_TABLET | Freq: Every day | ORAL | Status: DC
  Administered 2017-05-17 – 2017-05-18 (×2): 20 mg via ORAL
  Filled 2017-05-16 (×2): qty 1

## 2017-05-16 MED ORDER — LACTATED RINGERS IR SOLN
Status: DC | PRN
  Administered 2017-05-16: 1000 mL

## 2017-05-16 MED ORDER — PANTOPRAZOLE SODIUM 40 MG PO TBEC
40.0000 mg | DELAYED_RELEASE_TABLET | Freq: Every day | ORAL | Status: DC
  Administered 2017-05-17 – 2017-05-18 (×2): 40 mg via ORAL
  Filled 2017-05-16 (×2): qty 1

## 2017-05-16 MED ORDER — ONDANSETRON HCL 4 MG/2ML IJ SOLN: 4.0000 mg | INTRAMUSCULAR | Status: DC | PRN

## 2017-05-16 MED ORDER — FENTANYL CITRATE (PF) 100 MCG/2ML IJ SOLN
INTRAMUSCULAR | Status: DC | PRN
  Administered 2017-05-16: 50 ug via INTRAVENOUS
  Administered 2017-05-16: 100 ug via INTRAVENOUS
  Administered 2017-05-16 (×2): 50 ug via INTRAVENOUS

## 2017-05-16 MED ORDER — CEFAZOLIN SODIUM-DEXTROSE 1-4 GM/50ML-% IV SOLN
1.0000 g | Freq: Three times a day (TID) | INTRAVENOUS | Status: AC
  Administered 2017-05-16 – 2017-05-17 (×2): 1 g via INTRAVENOUS
  Filled 2017-05-16 (×2): qty 50

## 2017-05-16 MED ORDER — STERILE WATER FOR IRRIGATION IR SOLN
Status: DC | PRN
  Administered 2017-05-16: 1000 mL

## 2017-05-16 MED ORDER — FENTANYL CITRATE (PF) 250 MCG/5ML IJ SOLN
INTRAMUSCULAR | Status: AC
  Filled 2017-05-16: qty 5

## 2017-05-16 MED ORDER — LIDOCAINE 2% (20 MG/ML) 5 ML SYRINGE
INTRAMUSCULAR | Status: DC | PRN
  Administered 2017-05-16: 100 mg via INTRAVENOUS

## 2017-05-16 MED ORDER — DIPHENHYDRAMINE HCL 12.5 MG/5ML PO ELIX
12.5000 mg | ORAL_SOLUTION | Freq: Four times a day (QID) | ORAL | Status: DC | PRN
  Filled 2017-05-16: qty 10

## 2017-05-16 MED ORDER — CEFAZOLIN SODIUM-DEXTROSE 2-4 GM/100ML-% IV SOLN
2.0000 g | Freq: Once | INTRAVENOUS | Status: AC
  Administered 2017-05-16: 2 g via INTRAVENOUS
  Filled 2017-05-16: qty 100

## 2017-05-16 MED ORDER — MORPHINE SULFATE (PF) 4 MG/ML IV SOLN: 2.0000 mg | INTRAVENOUS | Status: DC | PRN

## 2017-05-16 MED ORDER — ROCURONIUM BROMIDE 10 MG/ML (PF) SYRINGE
PREFILLED_SYRINGE | INTRAVENOUS | Status: DC | PRN
  Administered 2017-05-16: 40 mg via INTRAVENOUS
  Administered 2017-05-16: 10 mg via INTRAVENOUS
  Administered 2017-05-16: 20 mg via INTRAVENOUS

## 2017-05-16 MED ORDER — MIDAZOLAM HCL 2 MG/2ML IJ SOLN
INTRAMUSCULAR | Status: AC
  Filled 2017-05-16: qty 2

## 2017-05-16 MED ORDER — DEXAMETHASONE SODIUM PHOSPHATE 10 MG/ML IJ SOLN
INTRAMUSCULAR | Status: AC
  Filled 2017-05-16: qty 1

## 2017-05-16 SURGICAL SUPPLY — 53 items
APPLICATOR SURGIFLO ENDO (HEMOSTASIS) ×2 IMPLANT
CHLORAPREP W/TINT 26ML (MISCELLANEOUS) ×2 IMPLANT
CLIP VESOLOCK LG 6/CT PURPLE (CLIP) ×2 IMPLANT
CLIP VESOLOCK MED LG 6/CT (CLIP) ×4 IMPLANT
COVER SURGICAL LIGHT HANDLE (MISCELLANEOUS) ×2 IMPLANT
COVER TIP SHEARS 8 DVNC (MISCELLANEOUS) ×1 IMPLANT
COVER TIP SHEARS 8MM DA VINCI (MISCELLANEOUS) ×1
DECANTER SPIKE VIAL GLASS SM (MISCELLANEOUS) IMPLANT
DERMABOND ADVANCED (GAUZE/BANDAGES/DRESSINGS) ×1
DERMABOND ADVANCED .7 DNX12 (GAUZE/BANDAGES/DRESSINGS) ×1 IMPLANT
DRAIN CHANNEL 15F RND FF 3/16 (WOUND CARE) ×2 IMPLANT
DRAPE ARM DVNC X/XI (DISPOSABLE) ×4 IMPLANT
DRAPE COLUMN DVNC XI (DISPOSABLE) ×1 IMPLANT
DRAPE DA VINCI XI ARM (DISPOSABLE) ×4
DRAPE DA VINCI XI COLUMN (DISPOSABLE) ×1
DRAPE INCISE IOBAN 66X45 STRL (DRAPES) ×2 IMPLANT
DRAPE SHEET LG 3/4 BI-LAMINATE (DRAPES) ×2 IMPLANT
DRSG TEGADERM 4X4.75 (GAUZE/BANDAGES/DRESSINGS) ×2 IMPLANT
ELECT PENCIL ROCKER SW 15FT (MISCELLANEOUS) ×2 IMPLANT
ELECT REM PT RETURN 15FT ADLT (MISCELLANEOUS) ×2 IMPLANT
EVACUATOR SILICONE 100CC (DRAIN) ×2 IMPLANT
GLOVE BIO SURGEON STRL SZ 6.5 (GLOVE) ×2 IMPLANT
GLOVE BIOGEL M STRL SZ7.5 (GLOVE) ×4 IMPLANT
GOWN STRL REUS W/TWL LRG LVL3 (GOWN DISPOSABLE) ×10 IMPLANT
IRRIG SUCT STRYKERFLOW 2 WTIP (MISCELLANEOUS) ×2
IRRIGATION SUCT STRKRFLW 2 WTP (MISCELLANEOUS) ×1 IMPLANT
KIT BASIN OR (CUSTOM PROCEDURE TRAY) ×2 IMPLANT
NS IRRIG 1000ML POUR BTL (IV SOLUTION) IMPLANT
POSITIONER SURGICAL ARM (MISCELLANEOUS) ×4 IMPLANT
POUCH SPECIMEN RETRIEVAL 10MM (ENDOMECHANICALS) ×2 IMPLANT
SCISSORS LAP 5X45 EPIX DISP (ENDOMECHANICALS) ×2 IMPLANT
SEAL CANN UNIV 5-8 DVNC XI (MISCELLANEOUS) ×4 IMPLANT
SEAL XI 5MM-8MM UNIVERSAL (MISCELLANEOUS) ×4
SOLUTION ELECTROLUBE (MISCELLANEOUS) ×2 IMPLANT
SURGIFLO W/THROMBIN 8M KIT (HEMOSTASIS) ×2 IMPLANT
SUT ETHILON 3 0 PS 1 (SUTURE) ×2 IMPLANT
SUT MNCRL AB 4-0 PS2 18 (SUTURE) ×4 IMPLANT
SUT PDS AB 0 CTX 36 PDP370T (SUTURE) IMPLANT
SUT V-LOC BARB 180 2/0GR6 GS22 (SUTURE) ×2
SUT VIC AB 0 CT1 27 (SUTURE) ×1
SUT VIC AB 0 CT1 27XBRD ANTBC (SUTURE) ×1 IMPLANT
SUT VICRYL 0 UR6 27IN ABS (SUTURE) ×2 IMPLANT
SUT VLOC BARB 180 ABS3/0GR12 (SUTURE) ×2
SUTURE V-LC BRB 180 2/0GR6GS22 (SUTURE) ×1 IMPLANT
SUTURE VLOC BRB 180 ABS3/0GR12 (SUTURE) ×1 IMPLANT
TOWEL OR 17X26 10 PK STRL BLUE (TOWEL DISPOSABLE) ×2 IMPLANT
TOWEL OR NON WOVEN STRL DISP B (DISPOSABLE) ×2 IMPLANT
TRAY FOLEY CATH SILVER 14FR (SET/KITS/TRAYS/PACK) ×2 IMPLANT
TRAY FOLEY W/METER SILVER 16FR (SET/KITS/TRAYS/PACK) IMPLANT
TRAY LAPAROSCOPIC (CUSTOM PROCEDURE TRAY) ×2 IMPLANT
TROCAR UNIVERSAL OPT 12M 100M (ENDOMECHANICALS) IMPLANT
TROCAR XCEL 12X100 BLDLESS (ENDOMECHANICALS) ×2 IMPLANT
WATER STERILE IRR 1000ML POUR (IV SOLUTION) IMPLANT

## 2017-05-16 NOTE — Op Note (Signed)
Preoperative diagnosis: Right renal neoplasm  Postoperative diagnosis: Right renal neoplasm  Procedure:  1. Right robotic-assisted laparoscopic partial nephrectomy  Surgeon: Pryor Curia. M.D.  Assistant(s): Debbrah Alar, PA-C  An assistant was required for this surgical procedure.  The duties of the assistant included but were not limited to suctioning, passing suture, camera manipulation, retraction. This procedure would not be able to be performed without an Environmental consultant.  Anesthesia: General  Complications: None  EBL: 50 mL  IVF:  1200 mL crystalloid  Specimens: 1. Right renal neoplasm  Disposition of specimens: Pathology  Intraoperative findings:       1. Warm renal ischemia time: 13 minutes  Drains: 1. # 15 Blake perinephric drain  Indication:  Jo Hale is a 57 y.o. year old patient with a right renal mass.  After a thorough review of the management options for their renal mass, they elected to proceed with surgical treatment and the above procedure.  We have discussed the potential benefits and risks of the procedure, side effects of the proposed treatment, the likelihood of the patient achieving the goals of the procedure, and any potential problems that might occur during the procedure or recuperation. Informed consent has been obtained.   Description of procedure:  The patient was taken to the operating room and a general anesthetic was administered. The patient was given preoperative antibiotics, placed in the right modified flank position with care to pad all potential pressure points, and prepped and draped in the usual sterile fashion. Next a preoperative timeout was performed.  A site was selected on in the midline for placement of the assistant port. This was placed using a standard open Hassan technique which allowed entry into the peritoneal cavity under direct vision and without difficulty. A 12 mm port was placed and a pneumoperitoneum  established. The camera was then used to inspect the abdomen and there was no evidence of any intra-abdominal injuries or other abnormalities. The remaining abdominal ports were then placed. 8 mm robotic ports were placed in the right upper quadrant, right lower quadrant, and far right lateral abdominal wall. An 8 mm port was placed for the camera site just right of the umbilicus. All ports were placed under direct vision without difficulty. The surgical cart was then docked.   Utilizing the cautery scissors, the white line of Toldt was incised allowing the colon to be mobilized medially and the plane between the mesocolon and the anterior layer of Gerota's fascia to be developed and the kidney to be exposed.  The ureter and gonadal vein were identified inferiorly and the ureter was lifted anteriorly off the psoas muscle.  Dissection proceeded superiorly along the gonadal vein until the renal vein was identified.  The renal hilum was then carefully isolated with a combination of blunt and sharp dissection allowing the renal arterial and venous structures to be separated and isolated in preparation for renal hilar vessel clamping. There were a single renal artery and single renal vein.    Attention turned to the kidney and the perinephric fat surrounding the renal mass was removed and the kidney was mobilized sufficiently for exposure and resection of the renal mass.   Once the renal mass was properly isolated, preparations were made for resection of the tumor.  Reconstructive sutures were placed into the abdomen for the renorrhaphy portion of the procedure.  The renal artery was then clamped with bulldog clamps.  The tumor was then excised with cold scissor dissection along with an adequate visible  gross margin of normal renal parenchyma. The tumor appeared to be excised without any gross violation of the tumor. The renal collecting system was entered during removal of the tumor.  A running 3-0 V-lock suture  was then brought through the capsule of the kidney and run along the base of the renal defect to provide hemostasis and close any entry into the renal collecting system if present. Weck clips were used to secure this suture outside the renal capsule at the proximal and distal ends. An additional hemostatic agent (Floseal) was then placed into the renal defect. A running 2-0 V lock suture was then used to close the capsule of the kidney using a sliding clip technique which resulted in excellent hemostasis.    The bulldog clamps were then removed from the renal hilar vessel(s). Total warm renal ischemia time was 13 minutes. The renal tumor resection site was examined. Hemostasis appeared adequate.   The kidney was placed back into its normal anatomic position and covered with perinephric fat as needed.  A # 57 Blake drain was then brought through the lateral lower port site and positioned in the perinephric space.  It was secured to the skin with a nylon suture. The surgical cart was undocked.  The renal tumor specimen was removed intact within an endopouch retrieval bag via the upper midline port site. This incision site was closed at the fascial layer with 0-vicryl suture. All other laparoscopic/robotic ports were removed under direct vision and the pneumoperitoneum let down with inspection of the operative field performed and hemostasis again confirmed. All incision sites were then injected with local anesthetic and reapproximated at the skin level with 4-0 monocryl subcuticular closures.  Dermabond was applied to the skin.  The patient tolerated the procedure well and without complications.  The patient was able to be extubated and transferred to the recovery unit in satisfactory condition.  Pryor Curia MD

## 2017-05-16 NOTE — Discharge Instructions (Signed)

## 2017-05-16 NOTE — Anesthesia Postprocedure Evaluation (Signed)
Anesthesia Post Note  Patient: Jo Hale  Procedure(s) Performed: XI ROBOTIC ASSISTED LAPAROSCOPIC PARTIAL NEPHRECTOMY (Right )     Patient location during evaluation: PACU Anesthesia Type: General Level of consciousness: awake and alert Pain management: pain level controlled Vital Signs Assessment: post-procedure vital signs reviewed and stable Respiratory status: spontaneous breathing, nonlabored ventilation, respiratory function stable and patient connected to nasal cannula oxygen Cardiovascular status: blood pressure returned to baseline and stable Postop Assessment: no apparent nausea or vomiting Anesthetic complications: no    Last Vitals:  Vitals:   05/16/17 1528 05/16/17 1543  BP: (!) 141/67 (!) 144/76  Pulse:    Resp:  16  Temp:  36.5 C  SpO2:  98%    Last Pain:  Vitals:   05/16/17 1823  TempSrc:   PainSc: [place]

## 2017-05-16 NOTE — Transfer of Care (Signed)
Immediate Anesthesia Transfer of Care Note  Patient: Jo Hale  Procedure(s) Performed: Procedure(s): XI ROBOTIC ASSISTED LAPAROSCOPIC PARTIAL NEPHRECTOMY (Right)  Patient Location: PACU  Anesthesia Type:General  Level of Consciousness:  sedated, patient cooperative and responds to stimulation  Airway & Oxygen Therapy:Patient Spontanous Breathing and Patient connected to face mask oxgen  Post-op Assessment:  Report given to PACU RN and Post -op Vital signs reviewed and stable  Post vital signs:  Reviewed and stable  Last Vitals:  Vitals:   05/16/17 0854  BP: (!) 152/78  Pulse: 76  Resp: 16  Temp: 36.8 C  SpO2: 643%    Complications: No apparent anesthesia complications

## 2017-05-16 NOTE — Progress Notes (Signed)
Patient ID: Jo Hale, female   DOB: Feb 12, 1961, 57 y.o.   MRN: [id number]   Post-op note  Subjective: The patient is doing well.  No complaints.  Objective: Vital signs in last 24 hours: Temp:  [97.4 F (36.3 C)-98.2 F (36.8 C)] 97.7 F (36.5 C) (02/04 1543) Pulse Rate:  [65-76] 71 (02/04 1526) Resp:  [13-19] 16 (02/04 1543) BP: (128-152)/(67-98) 144/76 (02/04 1543) SpO2:  [98 %-100 %] 98 % (02/04 1543) Weight:  [112.5 kg (248 lb)] 112.5 kg (248 lb) (02/04 0914)  Intake/Output from previous day: No intake/output data recorded. Intake/Output this shift: Total I/O In: 1200 [I.V.:1200] Out: 450 [Urine:400; Blood:50]  Physical Exam:  General: Alert and oriented. Abdomen: Soft, Nondistended. Incisions: Clean and dry.  Lab Results: Recent Labs    05/16/17 1430  HGB 12.6  HCT 37.8    Assessment/Plan: POD#0   1) Continue to monitor, IS   Pryor Curia. MD   LOS: 0 days   Thien Berka,LES 05/16/2017, 5:44 PM

## 2017-05-16 NOTE — Anesthesia Procedure Notes (Signed)
Procedure Name: Intubation Date/Time: 05/16/2017 11:30 AM Performed by: Glory Buff, CRNA Pre-anesthesia Checklist: Patient identified, Emergency Drugs available, Suction available and Patient being monitored Patient Re-evaluated:Patient Re-evaluated prior to induction Oxygen Delivery Method: Circle system utilized Preoxygenation: Pre-oxygenation with 100% oxygen Induction Type: IV induction Ventilation: Mask ventilation without difficulty Laryngoscope Size: Miller and 3 Grade View: Grade I Tube type: Oral Tube size: 7.0 mm Number of attempts: 1 Airway Equipment and Method: Stylet and Oral airway Placement Confirmation: ETT inserted through vocal cords under direct vision,  positive ETCO2 and breath sounds checked- equal and bilateral Secured at: 21 cm Tube secured with: Tape Dental Injury: Teeth and Oropharynx as per pre-operative assessment

## 2017-05-16 NOTE — Anesthesia Preprocedure Evaluation (Signed)
Anesthesia Evaluation  Patient identified by MRN, date of birth, ID band Patient awake    Reviewed: Allergy & Precautions, H&P , Patient's Chart, lab work & pertinent test results, reviewed documented beta blocker date and time   Airway Mallampati: II  TM Distance: >3 FB Neck ROM: full    Dental no notable dental hx.    Pulmonary    Pulmonary exam normal breath sounds clear to auscultation       Cardiovascular hypertension,  Rhythm:regular Rate:Normal     Neuro/Psych TIA   GI/Hepatic   Endo/Other  Morbid obesity  Renal/GU      Musculoskeletal   Abdominal   Peds  Hematology   Anesthesia Other Findings   Reproductive/Obstetrics                             Anesthesia Physical Anesthesia Plan  ASA: III  Anesthesia Plan: General   Post-op Pain Management:    Induction: Intravenous  PONV Risk Score and Plan: 3 and Dexamethasone, Ondansetron, Treatment may vary due to age or medical condition and Scopolamine patch - Pre-op  Airway Management Planned: Oral ETT  Additional Equipment:   Intra-op Plan:   Post-operative Plan: Extubation in OR  Informed Consent: I have reviewed the patients History and Physical, chart, labs and discussed the procedure including the risks, benefits and alternatives for the proposed anesthesia with the patient or authorized representative who has indicated his/her understanding and acceptance.   Dental Advisory Given  Plan Discussed with: CRNA and Surgeon  Anesthesia Plan Comments: (  )        Anesthesia Quick Evaluation

## 2017-05-17 LAB — BASIC METABOLIC PANEL
ANION GAP: 8 (ref 5–15)
BUN: 12 mg/dL (ref 6–20)
CALCIUM: 8.7 mg/dL — AB (ref 8.9–10.3)
CO2: 24 mmol/L (ref 22–32)
Chloride: 106 mmol/L (ref 101–111)
Creatinine, Ser: 1.3 mg/dL — ABNORMAL HIGH (ref 0.44–1.00)
GFR, EST AFRICAN AMERICAN: 52 mL/min — AB (ref >60)
GFR, EST NON AFRICAN AMERICAN: 45 mL/min — AB (ref >60)
Glucose, Bld: 139 mg/dL — ABNORMAL HIGH (ref 65–99)
Potassium: 3.7 mmol/L (ref 3.5–5.1)
Sodium: 138 mmol/L (ref 135–145)

## 2017-05-17 LAB — HEMOGLOBIN AND HEMATOCRIT, BLOOD
HEMATOCRIT: 36.4 % (ref 36.0–46.0)
HEMOGLOBIN: 12.2 g/dL (ref 12.0–15.0)

## 2017-05-17 LAB — CREATININE, FLUID (PLEURAL, PERITONEAL, JP DRAINAGE): Creat, Fluid: 1.4 mg/dL

## 2017-05-17 MED ORDER — ACETAMINOPHEN 325 MG PO TABS
650.0000 mg | ORAL_TABLET | ORAL | Status: DC | PRN
  Administered 2017-05-17: 650 mg via ORAL
  Filled 2017-05-17: qty 2

## 2017-05-17 MED ORDER — BISACODYL 10 MG RE SUPP
10.0000 mg | Freq: Once | RECTAL | Status: AC
Start: 2017-05-17 — End: 2017-05-17
  Administered 2017-05-17: 10 mg via RECTAL
  Filled 2017-05-17: qty 1

## 2017-05-17 NOTE — Plan of Care (Signed)
  Progressing Education: Knowledge of General Education information will improve 05/17/2017 0100 - Progressing by Talbert Forest, RN Health Behavior/Discharge Planning: Ability to manage health-related needs will improve 05/17/2017 0100 - Progressing by Talbert Forest, RN Clinical Measurements: Ability to maintain clinical measurements within normal limits will improve 05/17/2017 0100 - Progressing by Talbert Forest, RN Will remain free from infection 05/17/2017 0100 - Progressing by Talbert Forest, RN Diagnostic test results will improve 05/17/2017 0100 - Progressing by Talbert Forest, RN Respiratory complications will improve 05/17/2017 0100 - Progressing by Talbert Forest, RN Cardiovascular complication will be avoided 05/17/2017 0100 - Progressing by Talbert Forest, RN Pain Managment: General experience of comfort will improve 05/17/2017 0100 - Progressing by Talbert Forest, RN Safety: Ability to remain free from injury will improve 05/17/2017 0100 - Progressing by Talbert Forest, RN

## 2017-05-17 NOTE — Progress Notes (Signed)
Patient ID: Jo Hale, female   DOB: [birth year], 57 y.o.   MRN: [id number]  1 Day Post-Op Subjective: Pt doing well.  Pain controlled.  No flatus yet.  Objective: Vital signs in last 24 hours: Temp:  [97.4 F (36.3 C)-99.2 F (37.3 C)] 99.2 F (37.3 C) (02/05 0452) Pulse Rate:  [65-76] 75 (02/05 0452) Resp:  [13-19] 18 (02/05 0452) BP: (122-152)/(64-98) 122/64 (02/05 0452) SpO2:  [93 %-100 %] 93 % (02/05 0452) Weight:  [112.5 kg (248 lb)] 112.5 kg (248 lb) (02/04 0914)  Intake/Output from previous day: 02/04 0701 - 02/05 0700 In: 3655 [P.O.:240; I.V.:3015; IV Piggyback:400] Out: 2470 [Urine:2325; Drains:95; Blood:50] Intake/Output this shift: No intake/output data recorded.  Physical Exam:  General: Alert and oriented CV: RRR Lungs: Clear Abdomen: Soft, ND, Minimal BS Incisions: C/D/I Ext: NT, No erythema  Lab Results: Recent Labs    05/16/17 1430 05/17/17 0455  HGB 12.6 12.2  HCT 37.8 36.4   BMET Recent Labs    05/16/17 1430 05/17/17 0455  NA 139 138  K 3.4* 3.7  CL 103 106  CO2 27 24  GLUCOSE 126* 139*  BUN 12 12  CREATININE 1.15* 1.30*  CALCIUM 8.4* 8.7*     Studies/Results: No results found.  Assessment/Plan: POD # 1 s/p right RAL partial nephrectomy - Ambulate, IS - Advance diet as tolerated - Transition to po pain medication - D/C urethral catheter - Monitor renal function, drain output - Pathology pending   LOS: 1 day   Leshon Armistead,LES 05/17/2017, 7:40 AM

## 2017-05-18 LAB — BASIC METABOLIC PANEL
ANION GAP: 7 (ref 5–15)
BUN: 16 mg/dL (ref 6–20)
CALCIUM: 8.5 mg/dL — AB (ref 8.9–10.3)
CO2: 25 mmol/L (ref 22–32)
Chloride: 107 mmol/L (ref 101–111)
Creatinine, Ser: 1.47 mg/dL — ABNORMAL HIGH (ref 0.44–1.00)
GFR calc non Af Amer: 39 mL/min — ABNORMAL LOW (ref >60)
GFR, EST AFRICAN AMERICAN: 45 mL/min — AB (ref >60)
Glucose, Bld: 111 mg/dL — ABNORMAL HIGH (ref 65–99)
POTASSIUM: 3.4 mmol/L — AB (ref 3.5–5.1)
Sodium: 139 mmol/L (ref 135–145)

## 2017-05-18 LAB — HEMOGLOBIN AND HEMATOCRIT, BLOOD
HEMATOCRIT: 34.8 % — AB (ref 36.0–46.0)
Hemoglobin: 11.8 g/dL — ABNORMAL LOW (ref 12.0–15.0)

## 2017-05-18 NOTE — Discharge Summary (Signed)
  Date of admission: 05/16/2017  Date of discharge: 05/18/2017  Admission diagnosis: Right renal neoplasm  Discharge diagnosis: Right renal neoplasm  Secondary diagnoses: Hypertension, gastroesophageal reflux disease, hyperlipidemia  History and Physical: For full details, please see admission history and physical. Briefly, Jo Hale is a 57 y.o. 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   Hospital Course: She underwent a right robot-assisted laparoscopic partial nephrectomy on 05/16/17.  She tolerated this procedure well and without complications.  Postoperatively, she remained hemodynamically stable.  On postoperative day 1, she was able to resume ambulating, her diet was advanced, and her pain was controllable with oral pain medication.  A drain was considered with serum and was removed on the morning of postoperative day 2.  She was stable for discharge that day.  Laboratory values:  Recent Labs    05/16/17 1430 05/17/17 0455 05/18/17 0456  HGB 12.6 12.2 11.8*  HCT 37.8 36.4 34.8*   Recent Labs    05/17/17 0455 05/18/17 0456  CREATININE 1.30* 1.47*    Disposition: Home  Discharge instruction: The patient was instructed to be ambulatory but told to refrain from heavy lifting, strenuous activity, or driving.   Discharge medications:  Allergies as of 05/18/2017      Reactions   Adhesive [tape] Rash      Medication List    STOP taking these medications   aspirin EC 81 MG tablet     TAKE these medications   atorvastatin 20 MG tablet Commonly known as:  LIPITOR Take 1 tablet (20 mg total) by mouth daily.   diphenhydrAMINE 25 mg capsule Commonly known as:  BENADRYL Take 25 mg by mouth every 6 (six) hours as needed (for allergies.).   HYDROcodone-acetaminophen 5-325 MG tablet Commonly known as:  NORCO Take 1-2 tablets by mouth every 6 (six) hours as needed for moderate pain or severe pain.   nystatin powder Commonly known as:   MYCOSTATIN/NYSTOP Apply topically 3 (three) times daily.   pantoprazole 40 MG tablet Commonly known as:  PROTONIX Take 1 tablet (40 mg total) by mouth daily.   sodium chloride 0.65 % Soln nasal spray Commonly known as:  OCEAN Place 1 spray into both nostrils 4 (four) times daily as needed for congestion.   triamterene-hydrochlorothiazide 37.5-25 MG tablet Commonly known as:  MAXZIDE-25 Take 1 tablet by mouth daily.       Followup:  Follow-up Information    Raynelle Bring, MD Follow up on 06/14/2017.   Specialty:  Urology Why:  at 9:00 Contact information: [address] [place] [address] [phone]

## 2017-05-18 NOTE — Progress Notes (Signed)
Patient ID: Jo Hale, female   DOB: [birth year], 57 y.o.   MRN: [id number]   2 Days Post-Op Subjective: Doing well.  Passing flatus now.  Tolerating diet.  Ambulating well.  Pain controlled.  Objective: Vital signs in last 24 hours: Temp:  [98.9 F (37.2 C)-99.6 F (37.6 C)] 99 F (37.2 C) (02/06 0550) Pulse Rate:  [68-92] 68 (02/06 0550) Resp:  [16-18] 16 (02/06 0550) BP: (114-131)/(51-70) 131/70 (02/06 0550) SpO2:  [95 %-98 %] 98 % (02/06 0550)  Intake/Output from previous day: 02/05 0701 - 02/06 0700 In: 2053.8 [P.O.:840; I.V.:1143.8] Out: 9150 [Urine:1800; Drains:20] Intake/Output this shift: No intake/output data recorded.  Physical Exam:  General: Alert and oriented CV: RRR Lungs: Clear Abdomen: Soft, ND Incisions: C/D/I Ext: NT, No erythema  Lab Results: Recent Labs    05/16/17 1430 05/17/17 0455 05/18/17 0456  HGB 12.6 12.2 11.8*  HCT 37.8 36.4 34.8*   BMET Recent Labs    05/17/17 0455 05/18/17 0456  NA 138 139  K 3.7 3.4*  CL 106 107  CO2 24 25  GLUCOSE 139* 111*  BUN 12 16  CREATININE 1.30* 1.47*  CALCIUM 8.7* 8.5*     Studies/Results: Pathology pending.  Assessment/Plan: D/C drain D/C home   LOS: 2 days   Archie Atilano,LES 05/18/2017, 8:08 AM

## 2017-05-18 NOTE — Plan of Care (Signed)
  Progressing Education: Knowledge of General Education information will improve 05/18/2017 0047 - Progressing by Talbert Forest, RN Health Behavior/Discharge Planning: Ability to manage health-related needs will improve 05/18/2017 0047 - Progressing by Talbert Forest, RN Clinical Measurements: Ability to maintain clinical measurements within normal limits will improve 05/18/2017 0047 - Progressing by Talbert Forest, RN Will remain free from infection 05/18/2017 0047 - Progressing by Talbert Forest, RN Diagnostic test results will improve 05/18/2017 0047 - Progressing by Talbert Forest, RN Respiratory complications will improve 05/18/2017 0047 - Progressing by Talbert Forest, RN Cardiovascular complication will be avoided 05/18/2017 0047 - Progressing by Talbert Forest, RN Activity: Risk for activity intolerance will decrease 05/18/2017 0047 - Progressing by Talbert Forest, RN Nutrition: Adequate nutrition will be maintained 05/18/2017 0047 - Progressing by Talbert Forest, RN Coping: Level of anxiety will decrease 05/18/2017 0047 - Progressing by Talbert Forest, RN Elimination: Will not experience complications related to bowel motility 05/18/2017 0047 - Progressing by Talbert Forest, RN Will not experience complications related to urinary retention 05/18/2017 0047 - Progressing by Talbert Forest, RN Pain Managment: General experience of comfort will improve 05/18/2017 0047 - Progressing by Talbert Forest, RN Safety: Ability to remain free from injury will improve 05/18/2017 0047 - Progressing by Talbert Forest, RN Skin Integrity: Risk for impaired skin integrity will decrease 05/18/2017 0047 - Progressing by Talbert Forest, RN

## 2017-06-02 ENCOUNTER — Ambulatory Visit: Payer: 59 | Admitting: Family Medicine

## 2017-06-02 ENCOUNTER — Encounter: Payer: Self-pay | Admitting: Family Medicine

## 2017-06-02 ENCOUNTER — Ambulatory Visit (HOSPITAL_COMMUNITY)
Admission: RE | Admit: 2017-06-02 | Discharge: 2017-06-02 | Disposition: A | Discharge status: Home / Self Care | Payer: 59 | Source: Ambulatory Visit | Attending: Family Medicine | Admitting: Family Medicine

## 2017-06-02 VITALS — BP 136/86 | Ht 62.0 in | Wt 245.6 lb

## 2017-06-02 DIAGNOSIS — M50322 Other cervical disc degeneration at C5-C6 level: Secondary | ICD-10-CM | POA: Diagnosis not present

## 2017-06-02 DIAGNOSIS — M542 Cervicalgia: Secondary | ICD-10-CM

## 2017-06-02 DIAGNOSIS — M47812 Spondylosis without myelopathy or radiculopathy, cervical region: Secondary | ICD-10-CM | POA: Diagnosis not present

## 2017-06-02 MED ORDER — CHLORZOXAZONE 500 MG PO TABS: ORAL_TABLET | ORAL | 0 refills | Status: DC

## 2017-06-02 NOTE — Progress Notes (Signed)
   Subjective:    Patient ID: Jo Hale, female    DOB: 05-30-1960, 57 y.o.   MRN: [id number]  HPI  Patient arrives with c/o neck pain and tightness this week. Patient is recuperating from kidney surgery. This patient had incidental renal cell cancer discovered on a recent scan states she was seeing a specialist for back pain they did some x-rays that showed a problem she had further tests which showed a cancer in the right kidney of the specialist removed part of the kidney and she will be seeing them in follow-up in several weeks  Since her surgery she has had left-sided neck pain and discomfort especially with certain movements rotating bending forward.  She denies numbness or tingling down the arm no weakness. Review of Systems She relates neck pain denies chest tightness fever chills sweats vomiting diarrhea hematuria    Objective:   Physical Exam Lungs clear respiratory rate is normal heart is regular no murmurs pulse normal BP good left side neck no masses are felt subjective tenderness in the paraspinal muscles on the left side       Assessment & Plan:  Neck strain should gradually get better but if it does not get better may need further imaging possibly even an MRI if he gets worse I doubt that there is a tumor underlying but we will go ahead and do x-rays I recommend she stay away from all anti-inflammatories because her creatinine function was elevated after her surgery she states that the specialist will be doing blood work to follow-up her CBC and metabolic 7  Muscle relaxers may be used in the evening time pain medicine if necessary-hopefully she will not had to take any pain medicine she may use Tylenol safely  no driving with these medicines- muscle relaxer etc.

## 2017-06-14 DIAGNOSIS — C641 Malignant neoplasm of right kidney, except renal pelvis: Secondary | ICD-10-CM | POA: Diagnosis not present

## 2017-06-21 ENCOUNTER — Encounter: Payer: Self-pay | Admitting: Family Medicine

## 2017-06-21 ENCOUNTER — Ambulatory Visit: Payer: 59 | Admitting: Family Medicine

## 2017-06-21 VITALS — BP 134/76 | Ht 62.0 in | Wt 249.0 lb

## 2017-06-21 DIAGNOSIS — I10 Essential (primary) hypertension: Secondary | ICD-10-CM

## 2017-06-21 DIAGNOSIS — Z79899 Other long term (current) drug therapy: Secondary | ICD-10-CM | POA: Diagnosis not present

## 2017-06-21 DIAGNOSIS — M542 Cervicalgia: Secondary | ICD-10-CM | POA: Diagnosis not present

## 2017-06-21 DIAGNOSIS — E785 Hyperlipidemia, unspecified: Secondary | ICD-10-CM | POA: Diagnosis not present

## 2017-06-21 MED ORDER — TRIAMTERENE-HCTZ 37.5-25 MG PO TABS: 1.0000 | ORAL_TABLET | Freq: Every day | ORAL | 1 refills | Status: DC

## 2017-06-21 MED ORDER — ATORVASTATIN CALCIUM 20 MG PO TABS: 20.0000 mg | ORAL_TABLET | Freq: Every day | ORAL | 1 refills | Status: DC

## 2017-06-21 MED ORDER — PANTOPRAZOLE SODIUM 40 MG PO TBEC: 40.0000 mg | DELAYED_RELEASE_TABLET | Freq: Every day | ORAL | 1 refills | Status: DC

## 2017-06-21 NOTE — Progress Notes (Signed)
   Subjective:    Patient ID: Jo Hale, female    DOB: Nov 06, 1960, 57 y.o.   MRN: [id number]  Hyperlipidemia  This is a chronic problem. The current episode started more than 1 year ago. Compliance problems include adherence to exercise (takes meds every day, eats healthy, not able to exercise since surgery).    f u scn and testing due in sox months for f u fr renal cancer operation   enrgy level returning back to normal   Patient continues to take lipid medication regularly. No obvious side effects from it. Generally does not miss a dose. Prior blood work results are reviewed with patient. Patient continues to work on fat intake in diet  Blood pressure medicine and blood pressure levels reviewed today with patient. Compliant with blood pressure medicine. States does not miss a dose. No obvious side effects. Blood pressure generally good when checked elsewhere. Watching salt intake.  Pt due to have f u for choronic back pain   Patient still experiencing left-sided neck pain.  See prior note.  Worse with turning from side to side.  No radiation into the arm.  Was told by a remote doctor may be related to positioning on the OR table   Not exrcising much yet  Still on limitation for activity  Review of Systems No headache, no major weight loss or weight gain, no chest pain no back pain abdominal pain no change in bowel habits complete ROS otherwise negative     Objective:   Physical Exam  Alert and oriented, vitals reviewed and stable, NAD ENT-TM's and ext canals WNL bilat via otoscopic exam Soft palate, tonsils and post pharynx WNL via oropharyngeal exam Neck-symmetric, no masses; thyroid nonpalpable and nontender Pulmonary-no tachypnea or accessory muscle use; Clear without wheezes via auscultation Card--no abnrml murmurs, rhythm reg and rate WNL Carotid pulses symmetric, without bruits       Assessment & Plan:  1 impression hypertension.  Good control.  Compliance  discussed maintain same medication  2.  Hyperlipidemia status uncertain.  Compliance discussed we will check further level  3.  Neck pain musculoskeletal.  Not neuropathic.  Hopefully will improve soon  4.  Status post renal cell carcinoma excision  Diet exercise discussed medications refilled check appropriate blood work further recommendations based on results

## 2017-06-22 ENCOUNTER — Encounter: Payer: Self-pay | Admitting: Family Medicine

## 2017-06-22 LAB — HEPATIC FUNCTION PANEL
ALBUMIN: 4 g/dL (ref 3.5–5.5)
ALK PHOS: 106 IU/L (ref 39–117)
ALT: 33 IU/L — AB (ref 0–32)
AST: 24 IU/L (ref 0–40)
Bilirubin Total: 0.2 mg/dL (ref 0.0–1.2)
Bilirubin, Direct: 0.09 mg/dL (ref 0.00–0.40)
Total Protein: 6.9 g/dL (ref 6.0–8.5)

## 2017-06-22 LAB — LIPID PANEL
CHOLESTEROL TOTAL: 137 mg/dL (ref 100–199)
Chol/HDL Ratio: 2.4 ratio (ref 0.0–4.4)
HDL: 57 mg/dL (ref >39)
LDL CALC: 66 mg/dL (ref 0–99)
TRIGLYCERIDES: 71 mg/dL (ref 0–149)
VLDL CHOLESTEROL CAL: 14 mg/dL (ref 5–40)

## 2017-07-11 ENCOUNTER — Other Ambulatory Visit: Payer: Self-pay | Admitting: Adult Health

## 2017-07-11 DIAGNOSIS — Z1231 Encounter for screening mammogram for malignant neoplasm of breast: Secondary | ICD-10-CM

## 2017-07-13 DIAGNOSIS — M47816 Spondylosis without myelopathy or radiculopathy, lumbar region: Secondary | ICD-10-CM | POA: Diagnosis not present

## 2017-07-15 ENCOUNTER — Ambulatory Visit (HOSPITAL_COMMUNITY)
Admission: RE | Admit: 2017-07-15 | Discharge: 2017-07-15 | Disposition: A | Discharge status: Home / Self Care | Payer: 59 | Source: Ambulatory Visit | Attending: Adult Health | Admitting: Adult Health

## 2017-07-15 DIAGNOSIS — Z1231 Encounter for screening mammogram for malignant neoplasm of breast: Secondary | ICD-10-CM

## 2017-08-11 DIAGNOSIS — M47816 Spondylosis without myelopathy or radiculopathy, lumbar region: Secondary | ICD-10-CM | POA: Diagnosis not present

## 2017-08-30 DIAGNOSIS — M545 Low back pain: Secondary | ICD-10-CM | POA: Diagnosis not present

## 2017-08-30 DIAGNOSIS — M47816 Spondylosis without myelopathy or radiculopathy, lumbar region: Secondary | ICD-10-CM | POA: Diagnosis not present

## 2017-09-01 ENCOUNTER — Ambulatory Visit (INDEPENDENT_AMBULATORY_CARE_PROVIDER_SITE_OTHER): Payer: 59 | Admitting: Adult Health

## 2017-09-01 ENCOUNTER — Other Ambulatory Visit (HOSPITAL_COMMUNITY)
Admission: RE | Admit: 2017-09-01 | Discharge: 2017-09-01 | Disposition: A | Discharge status: Home / Self Care | Payer: 59 | Source: Ambulatory Visit | Attending: Adult Health | Admitting: Adult Health

## 2017-09-01 ENCOUNTER — Encounter: Payer: Self-pay | Admitting: Adult Health

## 2017-09-01 VITALS — BP 128/70 | HR 71 | Ht 62.0 in | Wt 241.0 lb

## 2017-09-01 DIAGNOSIS — Z01411 Encounter for gynecological examination (general) (routine) with abnormal findings: Secondary | ICD-10-CM | POA: Diagnosis not present

## 2017-09-01 DIAGNOSIS — Z1151 Encounter for screening for human papillomavirus (HPV): Secondary | ICD-10-CM | POA: Insufficient documentation

## 2017-09-01 DIAGNOSIS — Z01419 Encounter for gynecological examination (general) (routine) without abnormal findings: Secondary | ICD-10-CM | POA: Insufficient documentation

## 2017-09-01 DIAGNOSIS — Z1211 Encounter for screening for malignant neoplasm of colon: Secondary | ICD-10-CM | POA: Insufficient documentation

## 2017-09-01 DIAGNOSIS — Z85528 Personal history of other malignant neoplasm of kidney: Secondary | ICD-10-CM | POA: Diagnosis not present

## 2017-09-01 DIAGNOSIS — Z1212 Encounter for screening for malignant neoplasm of rectum: Secondary | ICD-10-CM | POA: Diagnosis not present

## 2017-09-01 LAB — HEMOCCULT GUIAC POC 1CARD (OFFICE): Fecal Occult Blood, POC: NEGATIVE

## 2017-09-01 NOTE — Progress Notes (Signed)
Patient ID: Jo Hale, female   DOB: 01-Jul-1960, 57 y.o.   MRN: [id number] History of Present Illness: Jo Hale is a 57 year old black female, married in for well woman gyn exam and pap, she is sp [facility]. PCP is Mickie Hillier.    Current Medications, Allergies, Past Medical History, Past Surgical History, Family History and Social History were reviewed in [employer] record.     Review of Systems:  Patient denies any headaches, hearing loss, fatigue, blurred vision, shortness of breath, chest pain, abdominal pain, problems with bowel movements, urination, or intercourse. No joint pain or mood swings. Back bothers her, had 2 cm mass removed right kidney this year was renal cell cancer, was found when had imaging for back.   Physical Exam:BP 128/70 (BP Location: Left Arm, Patient Position: Sitting, Cuff Size: Large)   Pulse 71   Ht 5\' 2"  (1.575 m)   Wt 241 lb (109.3 kg)   LMP 02/17/2011 Comment: partial   BMI 44.08 kg/m  General:  Well developed, well nourished, no acute distress Skin:  Warm and dry Neck:  Midline trachea, normal thyroid, good ROM, no lymphadenopathy Lungs; Clear to auscultation bilaterally Breast:  No dominant palpable mass, retraction, or nipple discharge Cardiovascular: Regular rate and rhythm Abdomen:  Soft, non tender, no hepatosplenomegaly Pelvic:  External genitalia is normal in appearance, no lesions.  The vagina is normal in appearance. Urethra has no lesions or masses. The cervix is bulbous.pap with HPV and GC/CHL performed.  Uterus is absent. No adnexal masses or tenderness noted.Bladder is non tender, no masses felt. Rectal: Good sphincter tone, no polyps, or hemorrhoids felt.  Hemoccult negative. Extremities/musculoskeletal:  No swelling or varicosities noted, no clubbing or cyanosis Psych:  No mood changes, alert and cooperative,seems happy PHQ 2 score 0.  Impression: 1. Encounter for gynecological examination with Papanicolaou  smear of cervix   2. Screening for colorectal cancer       Plan: Physical in 1 year Pap in 3 years if normal Mammogram yearly Labs with PCP Colonoscopy per GI

## 2017-09-06 LAB — CYTOLOGY - PAP
ADEQUACY: ABSENT
Chlamydia: NEGATIVE
DIAGNOSIS: NEGATIVE
HPV: NOT DETECTED
Neisseria Gonorrhea: NEGATIVE

## 2017-09-14 ENCOUNTER — Other Ambulatory Visit: Payer: Self-pay

## 2017-09-14 ENCOUNTER — Encounter (HOSPITAL_COMMUNITY): Payer: Self-pay | Admitting: Physical Therapy

## 2017-09-14 ENCOUNTER — Ambulatory Visit (HOSPITAL_COMMUNITY): Payer: 59 | Attending: Physical Medicine and Rehabilitation | Admitting: Physical Therapy

## 2017-09-14 DIAGNOSIS — R2689 Other abnormalities of gait and mobility: Secondary | ICD-10-CM | POA: Insufficient documentation

## 2017-09-14 DIAGNOSIS — M6281 Muscle weakness (generalized): Secondary | ICD-10-CM | POA: Insufficient documentation

## 2017-09-14 DIAGNOSIS — G8929 Other chronic pain: Secondary | ICD-10-CM | POA: Diagnosis not present

## 2017-09-14 DIAGNOSIS — M545 Low back pain: Secondary | ICD-10-CM | POA: Diagnosis not present

## 2017-09-14 DIAGNOSIS — R29898 Other symptoms and signs involving the musculoskeletal system: Secondary | ICD-10-CM | POA: Diagnosis not present

## 2017-09-15 ENCOUNTER — Ambulatory Visit (HOSPITAL_COMMUNITY): Payer: 59

## 2017-09-15 ENCOUNTER — Encounter (HOSPITAL_COMMUNITY): Payer: Self-pay

## 2017-09-15 DIAGNOSIS — R29898 Other symptoms and signs involving the musculoskeletal system: Secondary | ICD-10-CM

## 2017-09-15 DIAGNOSIS — M545 Low back pain: Principal | ICD-10-CM

## 2017-09-15 DIAGNOSIS — G8929 Other chronic pain: Secondary | ICD-10-CM

## 2017-09-15 DIAGNOSIS — M6281 Muscle weakness (generalized): Secondary | ICD-10-CM

## 2017-09-15 DIAGNOSIS — R2689 Other abnormalities of gait and mobility: Secondary | ICD-10-CM

## 2017-09-15 NOTE — Patient Instructions (Signed)
Isometric Abdominal    Lying on back with knees bent, tighten stomach by pressing elbows down. Hold 5 seconds. Repeat 10 times per set. Do 1-2 sets per session. Do at least 4sessions per day.  http://orth.exer.us/1087   Copyright  VHI. All rights reserved.   Bracing With Leg March (Hook-Lying)    With neutral spine, tighten pelvic floor and abdominals and hold. Alternating legs, lift foot ___ inches and return to floor. Repeat 10 times. Do 2 times a day.   Copyright  VHI. All rights reserved.   Bridge    Lie back, legs bent. Inhale, pressing hips up. Keeping ribs in, lengthen lower back. Exhale, rolling down along spine from top. Repeat 10 times. Do 1-2 sessions per day.  http://pm.exer.us/55   Copyright  VHI. All rights reserved.

## 2017-09-15 NOTE — Therapy (Signed)
[facility] [address] [place], [name], [address] Phone: [phone]   Fax:  [phone]  Physical Therapy Evaluation  Patient Details  Name: Jo Hale MRN: [id number] Date of Birth: 1960/10/27 Referring Provider: Suella Broad, MD   Encounter Date: 09/14/2017  PT End of Session - 09/14/17 1854    Visit Number  1    Number of Visits  9    Date for PT Re-Evaluation  10/14/17    Authorization Type  [place] (No auth. 60 visit limit PT/OT/ST)    Authorization - Visit Number  1    Authorization - Number of Visits  60 60 visits PT/OT/ST combined    PT Start Time  1116    PT Stop Time  1200    PT Time Calculation (min)  44 min    Activity Tolerance  Patient tolerated treatment well;No increased pain    Behavior During Therapy  WFL for tasks assessed/performed       Past Medical History:  Diagnosis Date  . Abnormal pap   . Fibroids   . Fibroids, intramural    450-600 gm uterus  . GERD (gastroesophageal reflux disease)   . Hyperlipidemia   . Hypertension   . Menopause 08/27/2014  . Neoplasm of kidney   . PONV (postoperative nausea and vomiting)   . Posterior pain of right hip 07/31/2013  . Renal cancer, right ([place]) 05/2017  . TIA (transient ischemic attack) 2017 or 2018 unsure  . Vaginal Pap smear, abnormal     Past Surgical History:  Procedure Laterality Date  . CESAREAN SECTION    . COLONOSCOPY  02/19/2011   Fields-incomplete exam, otherwise normal   . LAPAROSCOPIC SUPRACERVICAL HYSTERECTOMY  03/02/2011   Procedure: LAPAROSCOPIC SUPRACERVICAL HYSTERECTOMY;  Surgeon: Jonnie Kind, MD;  Location: [facility];  Service: Gynecology;  Laterality: N/A;  . PARTIAL HYSTERECTOMY    . ROBOT ASSISTED LAPAROSCOPIC NEPHRECTOMY Right 05/16/2017   Procedure: XI ROBOTIC ASSISTED LAPAROSCOPIC PARTIAL NEPHRECTOMY;  Surgeon: Raynelle Bring, MD;  Location: [facility];  Service: Urology;  Laterality: Right;  . ROTATOR CUFF REPAIR     left  . TUBAL LIGATION      There were no vitals filed for this visit.   Subjective Assessment - 09/14/17 1132    Subjective  Patient reported that she has had low back pain for many years. Patient stated that in 2015 she had a fall which is when her back pain began and that it has gotten worse since. Patient reported that her pain can reach an 8 or a 9 out of 10 at times. Patient stated her pain is worse at night. She stated that her pain wakes her up at times, but that she is able to find a more comfortable position and fall back asleep.  Patient stated that she has occasional tingling or numbness in her legs which goes into upper thighs which doesn't last long. However, patient denied any saddle paresthesia. Patient also denied any bowel or bladder changes. Patient denied any unexpected changes in weight. Patient denied fever or night sweats. Patient reported some occasional arm tingling at night, however she stated that she spoke to her physician about this and that she has a pinched nerve in her arm. Patient reported that she has a history of cancer in which part of her right kidney was removed in February 2019, however patient stated she has not had issues with it since then.  Patient also reported having a mild  stroke in February or March of 2018.     Pertinent History  Partial Right Nephrectomy 05/16/17, History of cancer, Fall in 2015    Limitations  Walking;Standing;Lifting;House hold activities    How long can you sit comfortably?  10-15 minutes    How long can you stand comfortably?  5-10 minutes    How long can you walk comfortably?  5-10 minutes    Diagnostic tests  x-ray cervical spine 06/02/17: moderate degenerative disc disease and spondylosis at C5-6. CT abdomen and pelvis 02/12/17: renal cell carcinoma exophytic from the right mid kidney. MRI Lumbar spine 10/11/14: Moderate severe right facet arthritis at L5-S1. Lumbar x-ray 10/09/13: No evidence of spondylolyses or listhesis    Patient  Stated Goals  Less pain    Currently in Pain?  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  Yes  Is the patient reluctant to leave their home because of a fear of falling?  No  Home Environment  Living Environment Private residence  Living Arrangements Spouse/significant other  Type of [place] to enter  Entrance Stairs-Number of Steps 2  Entrance Stairs-Rails None  Home Layout One level  Prior Function  Level of Independence Independent;Independent with basic ADLs  Vocation Other (comment) (Filled out paperwork for disability. Not working)  Cognition  Overall Cognitive Status Within Functional Limits for tasks assessed  Observation/Other Assessments  Focus on Therapeutic Outcomes (FOTO)  59% (41% limited)  Sensation  Light Touch Not tested (Patient denied any tingling or numbness currently)  Posture/Postural Control  Posture/Postural Control Postural limitations  Postural Limitations Rounded Shoulders;Anterior pelvic tilt  Tone  Assessment Location RLE;LLE  AROM  Lumbar Flexion 50% limited. Increased pain. Able to reach mid-shin  Lumbar Extension 75%  limited. Increased pain  Lumbar - Right Side Bend WFL  Lumbar - Left Side Bend WFL  Lumbar - Right Rotation [facility]  Lumbar - Left Rotation [facility]  Strength  Right/Left Hip Right;Left  Right/Left Knee Right;Left  Right/Left Ankle Right;Left  Right Hip Flexion 4/5 (painful)  Right Hip Extension 3+/5  Right Hip ABduction 4+/5  Left Hip Flexion 4/5 (painful)  Left Hip Extension 3/5  Left Hip ABduction 4/5  Right Knee Flexion 5/5  Right Knee Extension 4+/5  Left Knee Flexion 5/5  Left Knee Extension 5/5  Right Ankle Dorsiflexion 4+/5  Left Ankle Dorsiflexion 4+/5  Palpation  Spinal mobility Lumbar spine more mobile than thoracic spine with posterior to  anterior glide spinal assessment of thoracic and lumbar spine  Palpation comment Patient reported increased pain with palpation of right PSIS and T10 spine. Patient reported tenderness through bilateral piriformis and gluteal palpation. Noted increased muscular tightness through gluteals bilaterally.   Special Tests  Lumbar Tests Slump Test  Slump test  Findings Positive  Side  (Left and right increased back pain)  Ambulation/Gait  Stairs Yes  Stairs Assistance 7: Independent  Stair Management Technique One rail Right (going up)  Number of Stairs 4  Height of Stairs 6 (inches)  Gait Comments Patient ascended stairs with reciprocal patterna and single HHA. Patient descended stairs with step-to gait pattern and significant weight bearing through upper extremity. Patient observed ambulating into and out of clinic with trendelenberg gait and decreased gait velocity.   Static Standing Balance  Static Standing - Balance Support No upper extremity supported  Static Standing - Level of Assistance 7: Independent  Static Standing Balance -  Activities  Single Leg Stance - Right Leg;Single Leg Stance - Left Leg  Static Standing - Comment/# of Minutes SLS Rt: 6.6 seconds; SLS Lt: 2.91 seconds  Standardized Balance Assessment  Five times sit to  stand comments  Patient performed in 19.72 seconds with intermittent use of upper extremities on thighs   RLE Tone  RLE Tone WFL (Ankle clonus absent. Patellar and achilles reflexes 2+)  LLE Tone  LLE Tone WFL (Ankle clonus absent. Patellar and achilles reflexes 2+)                Objective measurements completed on examination: See above findings.              PT Education - 09/14/17 1902    Education Details  Patient was educated about examination findings and plan of care.     Person(s) Educated  Patient    Methods  Explanation    Comprehension  Verbalized understanding       PT Short Term Goals - 09/14/17 1857      PT SHORT TERM GOAL #1   Title  Patient will demonstrate understanding and report regular compliance with HEP in order to improve lower extremity strength, trunk stability, balance, and overall functional mobility.     Time  2    Period  Weeks    Status  New    Target Date  09/28/17      PT SHORT TERM GOAL #2   Title  Patient will report low back pain of no greater than a 7/10 over a 1 week period indicating better tolerance of daily activities.     Time  2    Period  Weeks    Status  New    Target Date  09/28/17      PT SHORT TERM GOAL #3   Title  Patient will report being able to stand for 20 minutes in order to be able to prepare a meal.     Time  2    Period  Weeks    Status  New    Target Date  09/28/17        PT Long Term Goals - 09/14/17 1857      PT LONG TERM GOAL #1   Title  Patient will demonstrate ability to perform single limb stance for 10 seconds on each lower extremity indicating improved balance and safety with stair negotiation.     Time  4    Period  Weeks    Status  New  Target Date  10/12/17      PT LONG TERM GOAL #2   Title  Patient will deomonstrate ability to perform the 5 x STS in 13 seconds or less indicating improved balance as well as functional strength.     Time  4    Period  Weeks    Status   New    Target Date  10/12/17      PT LONG TERM GOAL #3   Title  Patient will demonstrate improved MMT strength of at least 1/2 grade in all tested musculature in order to improve ease of stair navigation and overall functional mobility.     Time  4    Period  Weeks    Status  New    Target Date  10/12/17      PT LONG TERM GOAL #4   Title  Patient will report no greater than a 3/10 pain over the course of a 1 week period indicating better tolerance to daily activities.     Time  4    Period  Weeks    Status  New    Target Date  10/12/17             Plan - 09/14/17 1856    Clinical Impression Statement  Patient is a 57 year old female who presented to physical therapy with complaints of chronic low back pain. Patient has a history of a right partial nephrectomy in 2019, however patient reported that the back pain has been occurring since she had a fall in 2015. Patient's lower extremity reflexes were normal and clonus was absent bilaterally. Upon examination, patient demonstrated decreased lumbar ROM and pain which was increased through movement. Patient demonstrated decreased lower extremity strength and decreased balance with single limb stance and with the 5xSTS. In addition, with patient's performance of ambulation and stair negotiation patient demonstrated poor trunk stability. With palpation therapist was able to elicit patient's pain and also noted increased muscular restrictions particularly in the right gluteal muscles. Patient would benefit from skilled physical therapy in order to address the abovementioned deficits and help patient return to prior level of function.     History and Personal Factors relevant to plan of care:  Right partial nephrectomy 05/16/17, neoplasm of kidney, HTN, DMII, allergies    Clinical Presentation  Stable    Clinical Presentation due to:  FOTO, MMT, 5xSTS, clinical judgement    Clinical Decision Making  Low    Rehab Potential  Fair    Clinical  Impairments Affecting Rehab Potential  Positive: Motivated, positive attitude. Negative: Chronicity of issue    PT Frequency  2x / week    PT Duration  4 weeks    PT Treatment/Interventions  ADLs/Self Care Home Management;Gait training;Stair training;Functional mobility training;Therapeutic activities;Therapeutic exercise;Balance training;Neuromuscular re-education;Patient/family education;Orthotic Fit/Training;Manual techniques;Passive range of motion;Dry needling;Energy conservation;Taping;Splinting    PT Next Visit Plan  Continue to follow-up about tingling/numbness, initiate HEP including lumbar stabilization exercises and include bridges, initiate lumbar stabilization/TA activation, initiate functional lower extremity strengthening. Soft tissue mobilization as needed for pain.     PT Home Exercise Plan  Initiate first treatment session    Consulted and Agree with Plan of Care  Patient       Patient will benefit from skilled therapeutic intervention in order to improve the following deficits and impairments:  Abnormal gait, Decreased range of motion, Difficulty walking, Decreased activity tolerance, Pain, Decreased balance, Hypomobility, Improper body mechanics, Decreased mobility, Decreased strength, Postural dysfunction  Visit Diagnosis: Chronic bilateral low back pain, with sciatica presence unspecified  Other symptoms and signs involving the musculoskeletal system  Other abnormalities of gait and mobility  Muscle weakness (generalized)     Problem List Patient Active Problem List   Diagnosis Date Noted  . Screening for colorectal cancer 09/01/2017  . Encounter for gynecological examination with Papanicolaou smear of cervix 09/01/2017  . Neoplasm of right kidney 05/16/2017  . Superficial fungus infection of skin 08/31/2016  . Hyperlipidemia LDL goal <100 09/18/2015  . History of stroke 09/18/2015  . CVA (cerebral infarction) 07/02/2015  . Menopause 08/27/2014  . Meralgia  paresthetica of left side 04/05/2014  . Venous stasis of both lower extremities 04/05/2014  . Radicular low back pain 11/07/2013  . Leg weakness, bilateral 11/07/2013  . Lumbago 10/09/2013  . Posterior pain of right hip 07/31/2013  . Esophageal reflux 01/25/2013  . Essential hypertension, benign 01/25/2013  . Rectal bleed 01/28/2012  . Constipation 01/28/2012  . Pain in joint, shoulder region 08/02/2011  . Muscle weakness (generalized) 08/02/2011  . Status post rotator cuff repair 08/02/2011  . FOOT PAIN 12/22/2009  . 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 POPLITEAL CYST, LEFT 06/30/2009  . MEDIAL MENISCUS TEAR, LEFT 06/30/2009   Clarene Critchley PT, DPT 8:40 AM, 09/15/17 [place], [name], [address] Phone: [phone]   Fax:  [phone]  Name: Heleena Miceli MRN: [id number] Date of Birth: [birth year]

## 2017-09-15 NOTE — Therapy (Signed)
[place], [name], [address] Phone: [phone]   Fax:  [phone]  Physical Therapy Treatment  Patient Details  Name: Jo Hale MRN: [id number] Date of Birth: 1960/11/07 Referring Provider: Suella Broad, MD   Encounter Date: 09/15/2017  PT End of Session - 09/15/17 1042    Visit Number  2    Number of Visits  9    Date for PT Re-Evaluation  10/14/17    Authorization Type  [place] (No auth. 60 visit limit PT/OT/ST)    Authorization Time Period  6/5-->10/14/17    Authorization - Visit Number  2    Authorization - Number of Visits  60 60 visits PT/OT/SLP    PT Start Time  1036    PT Stop Time  1114    PT Time Calculation (min)  38 min    Activity Tolerance  Patient tolerated treatment well;No increased pain    Behavior During Therapy  WFL for tasks assessed/performed       Past Medical History:  Diagnosis Date  . Abnormal pap   . Fibroids   . Fibroids, intramural    450-600 gm uterus  . GERD (gastroesophageal reflux disease)   . Hyperlipidemia   . Hypertension   . Menopause 08/27/2014  . Neoplasm of kidney   . PONV (postoperative nausea and vomiting)   . Posterior pain of right hip 07/31/2013  . Renal cancer, right ([facility] [facility]) 05/2017  . TIA (transient ischemic attack) 2017 or 2018 unsure  . Vaginal Pap smear, abnormal     Past Surgical History:  Procedure Laterality Date  . CESAREAN SECTION    . COLONOSCOPY  02/19/2011   Fields-incomplete exam, otherwise normal   . LAPAROSCOPIC SUPRACERVICAL HYSTERECTOMY  03/02/2011   Procedure: LAPAROSCOPIC SUPRACERVICAL HYSTERECTOMY;  Surgeon: Jonnie Kind, MD;  Location: [facility];  Service: Gynecology;  Laterality: N/A;  . PARTIAL HYSTERECTOMY    . ROBOT ASSISTED LAPAROSCOPIC NEPHRECTOMY Right 05/16/2017   Procedure: XI ROBOTIC ASSISTED LAPAROSCOPIC PARTIAL NEPHRECTOMY;  Surgeon: Raynelle Bring, MD;  Location: [facility];  Service: Urology;  Laterality:  Right;  . ROTATOR CUFF REPAIR     left  . TUBAL LIGATION      There were no vitals filed for this visit.  Subjective Assessment - 09/15/17 1038    Subjective  Pt stated her Lt knee has increased pain, current pain scale 5/10 feels there is fluid inside joint.  Current pain scale in lower back pain scale 3/10 dull pain, worse at night.      Pertinent History  Partial Right Nephrectomy 05/16/17, History of cancer, Fall in 2015    Patient Stated Goals  Less pain    Currently in Pain?  Yes    Pain Score  3     Pain Location  Back    Pain Orientation  Lower    Pain Descriptors / Indicators  Dull    Pain Type  Chronic pain    Pain Onset  More than a month ago    Pain Frequency  Constant    Aggravating Factors   Walking, standing    Pain Relieving Factors  Taking medication    Effect of Pain on Daily Activities  Moderately limits    Multiple Pain Sites  Yes    Pain Score  5    Pain Location  Knee    Pain Orientation  Left    Pain Type  Chronic pain    Pain  Onset  More than a month ago    Pain Frequency  Intermittent    Aggravating Factors   unsure, bending, weight bearing    Pain Relieving Factors  just deals with it.                       [facility] Adult PT Treatment/Exercise - 09/15/17 0001      Exercises   Exercises  Lumbar      Lumbar Exercises: Supine   Ab Set  10 reps;5 seconds    AB Set Limitations  Verbal and tactile cueing for TA activation    Clam  10 reps;3 seconds;Other (comment) RTB    Bent Knee Raise  10 reps;3 seconds    Bent Knee Raise Limitations  with TA activation    Bridge  10 reps      Lumbar Exercises: Sidelying   Hip Abduction  10 reps             PT Education - 09/15/17 1157    Education Details  Reviewed goals and copy of eval given to pt.  Educated on importance of core strengthening to assist with LBP.  Establised HEP with core and proximal strengthening    Person(s) Educated  Patient    Methods   Explanation;Demonstration;Handout    Comprehension  Verbalized understanding;Returned demonstration;Need further instruction;Tactile cues required;Verbal cues required       PT Short Term Goals - 09/14/17 1857      PT SHORT TERM GOAL #1   Title  Patient will demonstrate understanding and report regular compliance with HEP in order to improve lower extremity strength, trunk stability, balance, and overall functional mobility.     Time  2    Period  Weeks    Status  New    Target Date  09/28/17      PT SHORT TERM GOAL #2   Title  Patient will report low back pain of no greater than a 7/10 over a 1 week period indicating better tolerance of daily activities.     Time  2    Period  Weeks    Status  New    Target Date  09/28/17      PT SHORT TERM GOAL #3   Title  Patient will report being able to stand for 20 minutes in order to be able to prepare a meal.     Time  2    Period  Weeks    Status  New    Target Date  09/28/17        PT Long Term Goals - 09/14/17 1857      PT LONG TERM GOAL #1   Title  Patient will demonstrate ability to perform single limb stance for 10 seconds on each lower extremity indicating improved balance and safety with stair negotiation.     Time  4    Period  Weeks    Status  New    Target Date  10/12/17      PT LONG TERM GOAL #2   Title  Patient will deomonstrate ability to perform the 5 x STS in 13 seconds or less indicating improved balance as well as functional strength.     Time  4    Period  Weeks    Status  New    Target Date  10/12/17      PT LONG TERM GOAL #3   Title  Patient will demonstrate improved MMT strength of at  least 1/2 grade in all tested musculature in order to improve ease of stair navigation and overall functional mobility.     Time  4    Period  Weeks    Status  New    Target Date  10/12/17      PT LONG TERM GOAL #4   Title  Patient will report no greater than a 3/10 pain over the course of a 1 week period indicating  better tolerance to daily activities.     Time  4    Period  Weeks    Status  New    Target Date  10/12/17            Plan - 09/15/17 1050    Clinical Impression Statement  Reviewed goals and copy of eval.  Established HEP with focus on lumbar stabilization exercises.  Multimodal cueing to improve TA activation and cueing to improve breathing during exercise and reduce holding breath, improved with reps.  No reports of increased pain through session, was limited by fatigue with activity.    Rehab Potential  Fair    Clinical Impairments Affecting Rehab Potential  Positive: Motivated, positive attitude. Negative: Chronicity of issue    PT Frequency  2x / week    PT Duration  4 weeks    PT Treatment/Interventions  ADLs/Self Care Home Management;Gait training;Stair training;Functional mobility training;Therapeutic activities;Therapeutic exercise;Balance training;Neuromuscular re-education;Patient/family education;Orthotic Fit/Training;Manual techniques;Passive range of motion;Dry needling;Energy conservation;Taping;Splinting    PT Next Visit Plan  Continue to follow-up about tingling/numbness, F/U with compliance wiht HEP.  Continue exercises to improve lumbar stabilization/TA activation, initiate functional lower extremity strengthening. Soft tissue mobilization as needed for pain.     PT Home Exercise Plan  6/6: TA activation, bent knee raise, bridge       Patient will benefit from skilled therapeutic intervention in order to improve the following deficits and impairments:  Abnormal gait, Decreased range of motion, Difficulty walking, Decreased activity tolerance, Pain, Decreased balance, Hypomobility, Improper body mechanics, Decreased mobility, Decreased strength, Postural dysfunction  Visit Diagnosis: Chronic bilateral low back pain, with sciatica presence unspecified  Other symptoms and signs involving the musculoskeletal system  Other abnormalities of gait and mobility  Muscle  weakness (generalized)     Problem List Patient Active Problem List   Diagnosis Date Noted  . Screening for colorectal cancer 09/01/2017  . Encounter for gynecological examination with Papanicolaou smear of cervix 09/01/2017  . Neoplasm of right kidney 05/16/2017  . Superficial fungus infection of skin 08/31/2016  . Hyperlipidemia LDL goal <100 09/18/2015  . History of stroke 09/18/2015  . CVA (cerebral infarction) 07/02/2015  . Menopause 08/27/2014  . Meralgia paresthetica of left side 04/05/2014  . Venous stasis of both lower extremities 04/05/2014  . Radicular low back pain 11/07/2013  . Leg weakness, bilateral 11/07/2013  . Lumbago 10/09/2013  . Posterior pain of right hip 07/31/2013  . Esophageal reflux 01/25/2013  . Essential hypertension, benign 01/25/2013  . Rectal bleed 01/28/2012  . Constipation 01/28/2012  . Pain in joint, shoulder region 08/02/2011  . Muscle weakness (generalized) 08/02/2011  . Status post rotator cuff repair 08/02/2011  . FOOT PAIN 12/22/2009  . 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 POPLITEAL CYST, LEFT 06/30/2009  . MEDIAL MENISCUS TEAR, LEFT 06/30/2009    Ihor Austin, LPTA; CBIS [phone]  Aldona Lento 09/15/2017, 12:07 PM  [facility] [address] [place], [name], [address] Phone: [phone]   Fax:  [phone]  Name: Jo Hale MRN: [id number] Date of Birth: [birth year]

## 2017-09-20 ENCOUNTER — Ambulatory Visit (HOSPITAL_COMMUNITY): Payer: 59 | Admitting: Physical Therapy

## 2017-09-20 ENCOUNTER — Encounter (HOSPITAL_COMMUNITY): Payer: Self-pay | Admitting: Physical Therapy

## 2017-09-20 DIAGNOSIS — R29898 Other symptoms and signs involving the musculoskeletal system: Secondary | ICD-10-CM

## 2017-09-20 DIAGNOSIS — M545 Low back pain: Secondary | ICD-10-CM | POA: Diagnosis not present

## 2017-09-20 DIAGNOSIS — G8929 Other chronic pain: Secondary | ICD-10-CM

## 2017-09-20 DIAGNOSIS — R2689 Other abnormalities of gait and mobility: Secondary | ICD-10-CM

## 2017-09-20 DIAGNOSIS — M6281 Muscle weakness (generalized): Secondary | ICD-10-CM

## 2017-09-20 NOTE — Therapy (Signed)
[place], [name], [address] Phone: [phone]   Fax:  [phone]  Physical Therapy Treatment  Patient Details  Name: Jo Hale MRN: [id number] Date of Birth: 06/29/1960 Referring Provider: Suella Broad, MD   Encounter Date: 09/20/2017  PT End of Session - 09/20/17 0910    Visit Number  3    Number of Visits  9    Date for PT Re-Evaluation  10/14/17    Authorization Type  [place] (No auth. 60 visit limit PT/OT/ST)    Authorization Time Period  6/5-->10/14/17    Authorization - Visit Number  3    Authorization - Number of Visits  60 60 visits PT/OT/SLP    PT Start Time  0905    PT Stop Time  0945    PT Time Calculation (min)  40 min    Activity Tolerance  Patient tolerated treatment well;No increased pain    Behavior During Therapy  WFL for tasks assessed/performed       Past Medical History:  Diagnosis Date  . Abnormal pap   . Fibroids   . Fibroids, intramural    450-600 gm uterus  . GERD (gastroesophageal reflux disease)   . Hyperlipidemia   . Hypertension   . Menopause 08/27/2014  . Neoplasm of kidney   . PONV (postoperative nausea and vomiting)   . Posterior pain of right hip 07/31/2013  . Renal cancer, right ([place]) 05/2017  . TIA (transient ischemic attack) 2017 or 2018 unsure  . Vaginal Pap smear, abnormal     Past Surgical History:  Procedure Laterality Date  . CESAREAN SECTION    . COLONOSCOPY  02/19/2011   Fields-incomplete exam, otherwise normal   . LAPAROSCOPIC SUPRACERVICAL HYSTERECTOMY  03/02/2011   Procedure: LAPAROSCOPIC SUPRACERVICAL HYSTERECTOMY;  Surgeon: Jonnie Kind, MD;  Location: [facility];  Service: Gynecology;  Laterality: N/A;  . PARTIAL HYSTERECTOMY    . ROBOT ASSISTED LAPAROSCOPIC NEPHRECTOMY Right 05/16/2017   Procedure: XI ROBOTIC ASSISTED LAPAROSCOPIC PARTIAL NEPHRECTOMY;  Surgeon: Raynelle Bring, MD;  Location: [facility];  Service: Urology;  Laterality:  Right;  . ROTATOR CUFF REPAIR     left  . TUBAL LIGATION      There were no vitals filed for this visit.  Subjective Assessment - 09/20/17 0906    Subjective  Patient stated that she is having some low back pain on the right side that she rated as a 4/10.    Pertinent History  Partial Right Nephrectomy 05/16/17, History of cancer, Fall in 2015    Patient Stated Goals  Less pain    Currently in Pain?  Yes    Pain Score  4     Pain Location  Back    Pain Orientation  Lower;Right    Pain Descriptors / Indicators  Shooting    Pain Type  Chronic pain    Pain Onset  More than a month ago                       [facility] Adult PT Treatment/Exercise - 09/20/17 0001      Lumbar Exercises: Standing   Forward Lunge  15 reps;Limitations    Forward Lunge Limitations  Onto 4'' step     Other Standing Lumbar Exercises  Tandem stance on foam 3 x 30 seconds each lower extremity with intermittent upper extremity support. SLS with vectors 5'' holds forward, side, back x 10 repetitions each lower extremity.  Sidestepping 14 feet x 2 roundtrips. Sit to stands from standard height chair 2 x 10 without upper extremities    Other Standing Lumbar Exercises  Palloff press with red theraband tandem gait x20 each lower extremity forward       Lumbar Exercises: Seated   Other Seated Lumbar Exercises  Seated on airex pad marching 3'' holds x 20 alternating legs with abdominal activation.       Lumbar Exercises: Supine   Ab Set  10 reps;5 seconds    AB Set Limitations  Verbal and tactile cueing for TA activation    Clam  10 reps;3 seconds;Other (comment) Red theraband    Bent Knee Raise  3 seconds;20 reps;Other (comment) Alternating lower extremities    Bent Knee Raise Limitations  with TA activation    Bridge  10 reps      Lumbar Exercises: Sidelying   Hip Abduction  15 reps             PT Education - 09/20/17 0907    Education Details  Patient was educated on purpose and technique of  exercises throughout session.     Person(s) Educated  Patient    Methods  Explanation;Verbal cues    Comprehension  Verbalized understanding;Returned demonstration       PT Short Term Goals - 09/14/17 1857      PT SHORT TERM GOAL #1   Title  Patient will demonstrate understanding and report regular compliance with HEP in order to improve lower extremity strength, trunk stability, balance, and overall functional mobility.     Time  2    Period  Weeks    Status  New    Target Date  09/28/17      PT SHORT TERM GOAL #2   Title  Patient will report low back pain of no greater than a 7/10 over a 1 week period indicating better tolerance of daily activities.     Time  2    Period  Weeks    Status  New    Target Date  09/28/17      PT SHORT TERM GOAL #3   Title  Patient will report being able to stand for 20 minutes in order to be able to prepare a meal.     Time  2    Period  Weeks    Status  New    Target Date  09/28/17        PT Long Term Goals - 09/14/17 1857      PT LONG TERM GOAL #1   Title  Patient will demonstrate ability to perform single limb stance for 10 seconds on each lower extremity indicating improved balance and safety with stair negotiation.     Time  4    Period  Weeks    Status  New    Target Date  10/12/17      PT LONG TERM GOAL #2   Title  Patient will deomonstrate ability to perform the 5 x STS in 13 seconds or less indicating improved balance as well as functional strength.     Time  4    Period  Weeks    Status  New    Target Date  10/12/17      PT LONG TERM GOAL #3   Title  Patient will demonstrate improved MMT strength of at least 1/2 grade in all tested musculature in order to improve ease of stair navigation and overall functional mobility.  Time  4    Period  Weeks    Status  New    Target Date  10/12/17      PT LONG TERM GOAL #4   Title  Patient will report no greater than a 3/10 pain over the course of a 1 week period indicating  better tolerance to daily activities.     Time  4    Period  Weeks    Status  New    Target Date  10/12/17            Plan - 09/20/17 8502    Clinical Impression Statement  This session continued to progress patient with abdominal strengthening and lower extremity strengthening. This session added sit to stands from standard height chair, palloff press, and forward lunges, sidestepping, tandem stance on foam, seated marching on foam with abdominal activation, and single leg stance with vectors. Patient denied any tingling or numbness in her legs throughout the session. Patient tolerated all exercises well. Patient would benefit from continued skilled physical therapy in order to continue progress toward goals.     Rehab Potential  Fair    Clinical Impairments Affecting Rehab Potential  Positive: Motivated, positive attitude. Negative: Chronicity of issue    PT Frequency  2x / week    PT Duration  4 weeks    PT Treatment/Interventions  ADLs/Self Care Home Management;Gait training;Stair training;Functional mobility training;Therapeutic activities;Therapeutic exercise;Balance training;Neuromuscular re-education;Patient/family education;Orthotic Fit/Training;Manual techniques;Passive range of motion;Dry needling;Energy conservation;Taping;Splinting    PT Next Visit Plan  Continue to follow-up about tingling/numbness, F/U with compliance wiht HEP. Initiate step ups. Continue exercises to improve lumbar stabilization/TA activation, initiate functional lower extremity strengthening. Soft tissue mobilization as needed for pain.     PT Home Exercise Plan  6/6: TA activation, bent knee raise, bridge       Patient will benefit from skilled therapeutic intervention in order to improve the following deficits and impairments:  Abnormal gait, Decreased range of motion, Difficulty walking, Decreased activity tolerance, Pain, Decreased balance, Hypomobility, Improper body mechanics, Decreased mobility,  Decreased strength, Postural dysfunction  Visit Diagnosis: Chronic bilateral low back pain, with sciatica presence unspecified  Other symptoms and signs involving the musculoskeletal system  Other abnormalities of gait and mobility  Muscle weakness (generalized)     Problem List Patient Active Problem List   Diagnosis Date Noted  . Screening for colorectal cancer 09/01/2017  . Encounter for gynecological examination with Papanicolaou smear of cervix 09/01/2017  . Neoplasm of right kidney 05/16/2017  . Superficial fungus infection of skin 08/31/2016  . Hyperlipidemia LDL goal <100 09/18/2015  . History of stroke 09/18/2015  . CVA (cerebral infarction) 07/02/2015  . Menopause 08/27/2014  . Meralgia paresthetica of left side 04/05/2014  . Venous stasis of both lower extremities 04/05/2014  . Radicular low back pain 11/07/2013  . Leg weakness, bilateral 11/07/2013  . Lumbago 10/09/2013  . Posterior pain of right hip 07/31/2013  . Esophageal reflux 01/25/2013  . Essential hypertension, benign 01/25/2013  . Rectal bleed 01/28/2012  . Constipation 01/28/2012  . Pain in joint, shoulder region 08/02/2011  . Muscle weakness (generalized) 08/02/2011  . Status post rotator cuff repair 08/02/2011  . FOOT PAIN 12/22/2009  . 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 POPLITEAL CYST, LEFT 06/30/2009  . MEDIAL MENISCUS TEAR, LEFT 06/30/2009   Clarene Critchley PT, DPT 9:59 AM, 09/20/17 [place], [name], [address] Phone: [phone]   Fax:  [phone]  Name: Jo Hale MRN: [id number] Date of Birth: 1960-12-11

## 2017-09-22 ENCOUNTER — Ambulatory Visit (HOSPITAL_COMMUNITY): Payer: 59

## 2017-09-22 ENCOUNTER — Encounter (HOSPITAL_COMMUNITY): Payer: Self-pay

## 2017-09-22 DIAGNOSIS — G8929 Other chronic pain: Secondary | ICD-10-CM

## 2017-09-22 DIAGNOSIS — M6281 Muscle weakness (generalized): Secondary | ICD-10-CM

## 2017-09-22 DIAGNOSIS — R2689 Other abnormalities of gait and mobility: Secondary | ICD-10-CM

## 2017-09-22 DIAGNOSIS — M545 Low back pain: Secondary | ICD-10-CM | POA: Diagnosis not present

## 2017-09-22 DIAGNOSIS — R29898 Other symptoms and signs involving the musculoskeletal system: Secondary | ICD-10-CM

## 2017-09-22 NOTE — Therapy (Signed)
[place], [name], [address] Phone: [phone]   Fax:  [phone]  Physical Therapy Treatment  Patient Details  Name: Jo Hale MRN: [id number] Date of Birth: 12/04/1960 Referring Provider: Suella Broad, MD   Encounter Date: 09/22/2017  PT End of Session - 09/22/17 1045    Visit Number  4    Number of Visits  9    Date for PT Re-Evaluation  10/14/17    Authorization Type  [employer] (No auth. 60 visit limit PT/OT/ST)    Authorization Time Period  6/5-->10/14/17    Authorization - Visit Number  4    Authorization - Number of Visits  60 60 visits PT/OT/SLP    PT Start Time  2703    PT Stop Time  5009    PT Time Calculation (min)  43 min    Activity Tolerance  Patient tolerated treatment well;No increased pain;Patient limited by fatigue    Behavior During Therapy  [facility] for tasks assessed/performed       Past Medical History:  Diagnosis Date  . Abnormal pap   . Fibroids   . Fibroids, intramural    450-600 gm uterus  . GERD (gastroesophageal reflux disease)   . Hyperlipidemia   . Hypertension   . Menopause 08/27/2014  . Neoplasm of kidney   . PONV (postoperative nausea and vomiting)   . Posterior pain of right hip 07/31/2013  . Renal cancer, right ([place]) 05/2017  . TIA (transient ischemic attack) 2017 or 2018 unsure  . Vaginal Pap smear, abnormal     Past Surgical History:  Procedure Laterality Date  . CESAREAN SECTION    . COLONOSCOPY  02/19/2011   Fields-incomplete exam, otherwise normal   . LAPAROSCOPIC SUPRACERVICAL HYSTERECTOMY  03/02/2011   Procedure: LAPAROSCOPIC SUPRACERVICAL HYSTERECTOMY;  Surgeon: Jonnie Kind, MD;  Location: [facility];  Service: Gynecology;  Laterality: N/A;  . PARTIAL HYSTERECTOMY    . ROBOT ASSISTED LAPAROSCOPIC NEPHRECTOMY Right 05/16/2017   Procedure: XI ROBOTIC ASSISTED LAPAROSCOPIC PARTIAL NEPHRECTOMY;  Surgeon: Raynelle Bring, MD;  Location: [facility];   Service: Urology;  Laterality: Right;  . ROTATOR CUFF REPAIR     left  . TUBAL LIGATION      There were no vitals filed for this visit.  Subjective Assessment - 09/22/17 1036    Subjective  Pt stated she continues to have constant pain in lower back more Rt than Lt, current pain scale 4/10.  Reports pain worse at night.    Pertinent History  Partial Right Nephrectomy 05/16/17, History of cancer, Fall in 2015    Patient Stated Goals  Less pain    Currently in Pain?  Yes    Pain Score  4     Pain Location  Back    Pain Orientation  Lower;Right    Pain Descriptors / Indicators  Constant    Pain Type  Chronic pain    Pain Onset  More than a month ago    Pain Frequency  Constant    Aggravating Factors   Walking, standing    Pain Relieving Factors  Taking medication    Effect of Pain on Daily Activities  Moderately limits                       [facility] Adult PT Treatment/Exercise - 09/22/17 0001      Lumbar Exercises: Standing   Functional Squats  15 reps;Limitations    Forward  Lunge  15 reps;Limitations    Forward Lunge Limitations  Onto 4'' step with ab set    Other Standing Lumbar Exercises  Vector stance on foam 5x 5"; 2 sets; sidestep with RTB 2RT; step up with 6in 10x BLE    Other Standing Lumbar Exercises  Palloff press with red theraband tandem stance on foam x20 each lower extremity forward       Lumbar Exercises: Seated   Other Seated Lumbar Exercises  Seated on airex pad marching 3'' holds x 20 alternating legs with abdominal activation.       Manual Therapy   Manual Therapy  Soft tissue mobilization    Manual therapy comments  Manual complete separate than rest of tx    Soft tissue mobilization  prone wiht pillow under hips, trigger point release Rt glut med, STM to lumbar QL, paraspinals and gluteal mm for pain control'               PT Short Term Goals - 09/14/17 1857      PT SHORT TERM GOAL #1   Title  Patient will demonstrate understanding  and report regular compliance with HEP in order to improve lower extremity strength, trunk stability, balance, and overall functional mobility.     Time  2    Period  Weeks    Status  New    Target Date  09/28/17      PT SHORT TERM GOAL #2   Title  Patient will report low back pain of no greater than a 7/10 over a 1 week period indicating better tolerance of daily activities.     Time  2    Period  Weeks    Status  New    Target Date  09/28/17      PT SHORT TERM GOAL #3   Title  Patient will report being able to stand for 20 minutes in order to be able to prepare a meal.     Time  2    Period  Weeks    Status  New    Target Date  09/28/17        PT Long Term Goals - 09/14/17 1857      PT LONG TERM GOAL #1   Title  Patient will demonstrate ability to perform single limb stance for 10 seconds on each lower extremity indicating improved balance and safety with stair negotiation.     Time  4    Period  Weeks    Status  New    Target Date  10/12/17      PT LONG TERM GOAL #2   Title  Patient will deomonstrate ability to perform the 5 x STS in 13 seconds or less indicating improved balance as well as functional strength.     Time  4    Period  Weeks    Status  New    Target Date  10/12/17      PT LONG TERM GOAL #3   Title  Patient will demonstrate improved MMT strength of at least 1/2 grade in all tested musculature in order to improve ease of stair navigation and overall functional mobility.     Time  4    Period  Weeks    Status  New    Target Date  10/12/17      PT LONG TERM GOAL #4   Title  Patient will report no greater than a 3/10 pain over the course of a 1 week  period indicating better tolerance to daily activities.     Time  4    Period  Weeks    Status  New    Target Date  10/12/17            Plan - 09/22/17 1221    Clinical Impression Statement  Session focus on core activation/strengthening and LE functional strengthening.  Added dynamic surface with  vector stance and palvo press, resistance with side step and began step up training for functional strengthening.  Cueing to improve awareness wiht abdominal activation through session to assist iwth LBP control.  Pt reports increased pain in Rt gluteal and QL region following sidestep with RTB.  EOS with manual to address tightness and trigger points Rt glut med and soft tissue restricitons in Bil QL region.  Pt educated on pillows under hips to improve tolerance for prone position.  Pt reports pain reduced significantly following manual.      Rehab Potential  Fair    Clinical Impairments Affecting Rehab Potential  Positive: Motivated, positive attitude. Negative: Chronicity of issue    PT Frequency  2x / week    PT Duration  4 weeks    PT Treatment/Interventions  ADLs/Self Care Home Management;Gait training;Stair training;Functional mobility training;Therapeutic activities;Therapeutic exercise;Balance training;Neuromuscular re-education;Patient/family education;Orthotic Fit/Training;Manual techniques;Passive range of motion;Dry needling;Energy conservation;Taping;Splinting    PT Next Visit Plan  Continue to follow-up about tingling/numbness, F/U with compliance wiht HEP. Add hip knee with step ups for core activation next session. Continue exercises to improve lumbar stabilization/TA activation, initiate functional lower extremity strengthening. Soft tissue mobilization as needed for pain.     PT Home Exercise Plan  6/6: TA activation, bent knee raise, bridge       Patient will benefit from skilled therapeutic intervention in order to improve the following deficits and impairments:  Abnormal gait, Decreased range of motion, Difficulty walking, Decreased activity tolerance, Pain, Decreased balance, Hypomobility, Improper body mechanics, Decreased mobility, Decreased strength, Postural dysfunction  Visit Diagnosis: Chronic bilateral low back pain, with sciatica presence unspecified  Other symptoms  and signs involving the musculoskeletal system  Other abnormalities of gait and mobility  Muscle weakness (generalized)     Problem List Patient Active Problem List   Diagnosis Date Noted  . Screening for colorectal cancer 09/01/2017  . Encounter for gynecological examination with Papanicolaou smear of cervix 09/01/2017  . Neoplasm of right kidney 05/16/2017  . Superficial fungus infection of skin 08/31/2016  . Hyperlipidemia LDL goal <100 09/18/2015  . History of stroke 09/18/2015  . CVA (cerebral infarction) 07/02/2015  . Menopause 08/27/2014  . Meralgia paresthetica of left side 04/05/2014  . Venous stasis of both lower extremities 04/05/2014  . Radicular low back pain 11/07/2013  . Leg weakness, bilateral 11/07/2013  . Lumbago 10/09/2013  . Posterior pain of right hip 07/31/2013  . Esophageal reflux 01/25/2013  . Essential hypertension, benign 01/25/2013  . Rectal bleed 01/28/2012  . Constipation 01/28/2012  . Pain in joint, shoulder region 08/02/2011  . Muscle weakness (generalized) 08/02/2011  . Status post rotator cuff repair 08/02/2011  . FOOT PAIN 12/22/2009  . 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 POPLITEAL CYST, LEFT 06/30/2009  . MEDIAL MENISCUS TEAR, LEFT 06/30/2009   Ihor Austin, LPTA; [place]  Aldona Lento 09/22/2017, 12:28 PM  [place] [address] [place], [name], [address] Phone: [phone]   Fax:  [phone]  Name: Jo Hale MRN: [id number] Date of Birth: [birth year]

## 2017-09-27 ENCOUNTER — Ambulatory Visit (HOSPITAL_COMMUNITY): Payer: 59

## 2017-09-27 ENCOUNTER — Encounter (HOSPITAL_COMMUNITY): Payer: Self-pay

## 2017-09-27 DIAGNOSIS — R2689 Other abnormalities of gait and mobility: Secondary | ICD-10-CM

## 2017-09-27 DIAGNOSIS — M545 Low back pain: Secondary | ICD-10-CM | POA: Diagnosis not present

## 2017-09-27 DIAGNOSIS — M47816 Spondylosis without myelopathy or radiculopathy, lumbar region: Secondary | ICD-10-CM | POA: Diagnosis not present

## 2017-09-27 DIAGNOSIS — G8929 Other chronic pain: Secondary | ICD-10-CM

## 2017-09-27 DIAGNOSIS — R29898 Other symptoms and signs involving the musculoskeletal system: Secondary | ICD-10-CM

## 2017-09-27 DIAGNOSIS — M6281 Muscle weakness (generalized): Secondary | ICD-10-CM

## 2017-09-27 NOTE — Therapy (Addendum)
[place], [name], [address] Phone: [phone]   Fax:  [phone]  Physical Therapy Treatment / Re-assessment  Patient Details  Name: Jo Hale MRN: [id number] Date of Birth: [birth year] Referring Provider: Suella Broad, MD   Encounter Date: 0/35/0093  As a licensed physical therapist I have read and agree with the following note.  Clarene Critchley PT, DPT 2:12 PM, 09/27/17 [phone]    PT End of Session - 09/27/17 0907    Visit Number  5    Number of Visits  9    Date for PT Re-Evaluation  10/14/17    Authorization Type  [employer] (No auth. 60 visit limit PT/OT/ST)    Authorization Time Period  6/5-->10/14/17    Authorization - Visit Number  5    Authorization - Number of Visits  60 60 visits PT/OT/SLP    PT Start Time  0903    PT Stop Time  0949    PT Time Calculation (min)  46 min    Activity Tolerance  Patient tolerated treatment well;No increased pain;Patient limited by fatigue    Behavior During Therapy  [facility] for tasks assessed/performed       Past Medical History:  Diagnosis Date  . Abnormal pap   . Fibroids   . Fibroids, intramural    450-600 gm uterus  . GERD (gastroesophageal reflux disease)   . Hyperlipidemia   . Hypertension   . Menopause 08/27/2014  . Neoplasm of kidney   . PONV (postoperative nausea and vomiting)   . Posterior pain of right hip 07/31/2013  . Renal cancer, right ([place]) 05/2017  . TIA (transient ischemic attack) 2017 or 2018 unsure  . Vaginal Pap smear, abnormal     Past Surgical History:  Procedure Laterality Date  . CESAREAN SECTION    . COLONOSCOPY  02/19/2011   Fields-incomplete exam, otherwise normal   . LAPAROSCOPIC SUPRACERVICAL HYSTERECTOMY  03/02/2011   Procedure: LAPAROSCOPIC SUPRACERVICAL HYSTERECTOMY;  Surgeon: Jonnie Kind, MD;  Location: [facility];  Service: Gynecology;  Laterality: N/A;  . PARTIAL HYSTERECTOMY    . ROBOT ASSISTED  LAPAROSCOPIC NEPHRECTOMY Right 05/16/2017   Procedure: XI ROBOTIC ASSISTED LAPAROSCOPIC PARTIAL NEPHRECTOMY;  Surgeon: Raynelle Bring, MD;  Location: [facility];  Service: Urology;  Laterality: Right;  . ROTATOR CUFF REPAIR     left  . TUBAL LIGATION      There were no vitals filed for this visit.  Subjective Assessment - 09/27/17 0905    Subjective  Pt stated she has constant stabbing pain in Rt side of lower back, pain scale 4/10 worse at night     Pertinent History  Partial Right Nephrectomy 05/16/17, History of cancer, Fall in 2015    Patient Stated Goals  Less pain    Currently in Pain?  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, lumbar region    Referring Provider  Suella Broad, MD    Next MD Visit  09/27/17    Prior Therapy  Yes, for hips and legs      Observation/Other Assessments   Focus on Therapeutic Outcomes (FOTO)   44% (56% limited)      AROM   Lumbar Flexion  25% limited was 50% limited, increased pain    Lumbar Extension  60% limited was limited 75%, increased pain      Strength   Right Hip Flexion  4/5 was 4/5 painful    Right Hip Extension  4-/5 was 3+    Right Hip ABduction  4+/5 was 4+/5    Left Hip Flexion  4/5 was 4/5    Left Hip Extension  3+/5 was 3/5    Left Hip ABduction  4/5 was 4/5tender at knee wiht palpation    Right/Left Knee  Right;Left    Right Knee Flexion  5/5    Right Knee Extension  4+/5 was 4+/53    Left Knee Flexion  5/5 5    Left Knee Extension  5/5 5    Right/Left Ankle   Right;Left    Right Ankle Dorsiflexion  4+/5 was 4+/5    Left Ankle Dorsiflexion  4+/5 was 4+/5      Standardized Balance Assessment   Five times sit to stand comments   19.25 was 19.72                   [facility] Adult PT Treatment/Exercise - 09/27/17 0001      Lumbar Exercises: Machines for Strengthening   Other Lumbar Machine Exercise  power tower 25 degrees 15x      Lumbar Exercises: Standing   Functional Squats  5 reps cueing for mechanics; reports knee instability/pain    Other Standing Lumbar Exercises  SLS x3; Vector stance on foam 5x 5"; 2 sets; sidestep with RTB 2RT; step up with 6in 10x BLE    Other Standing Lumbar Exercises  Palloff press with red theraband tandem stance on foam x20 each lower extremity forward       Manual Therapy   Manual Therapy  Soft tissue mobilization    Manual therapy comments  Manual complete separate than rest of tx    Soft tissue mobilization  prone wiht pillow under hips, trigger point release Rt glut med, STM to lumbar QL, paraspinals and gluteal mm for pain control'               PT Short Term Goals - 09/27/17 7062      PT SHORT TERM GOAL #1   Title  Patient will demonstrate understanding and report regular compliance with HEP in order to improve lower extremity strength, trunk stability, balance, and overall functional mobility.     Baseline  09/27/17: Reports compliance daily, able to verbalize exercise    Status  Achieved      PT SHORT TERM GOAL #2   Title  Patient will report low back pain of no greater than a 7/10 over a 1 week period indicating better tolerance of daily activities.     Baseline  09/27/17: average pain scale 3-->8/10, worse at night      PT SHORT TERM GOAL #3   Title  Patient will report being able to stand for 20 minutes in order to be able to prepare a meal.     Baseline  09/27/17: Able to stand for 10  minutes prior increased pain     Status  On-going        PT Long Term Goals - 09/27/17 4132       PT LONG TERM GOAL #1   Title  Patient will demonstrate ability to perform single limb stance for 10 seconds on each lower extremity indicating improved balance and safety with stair negotiation.     Baseline  09/27/17:  Lt 14", Rt 16"    Status  Achieved      PT LONG TERM GOAL #2   Title  Patient will deomonstrate ability to perform the 5 x STS in 13 seconds or less indicating improved balance as well as functional strength.     Baseline  09/27/17: 5 STS 19.25"    Status  On-going      PT LONG TERM GOAL #3   Title  Patient will demonstrate improved MMT strength of at least 1/2 grade in all tested musculature in order to improve ease of stair navigation and overall functional mobility.     Status  On-going      PT LONG TERM GOAL #4   Title  Patient will report no greater than a 3/10 pain over the course of a 1 week period indicating better tolerance to daily activities.     Status  Not Met            Plan - 09/27/17 1245    Clinical Impression Statement  Pt continues to be limited by pain in Rt gluteal region.  Reviewed goals this session prior MD apt.  Pt reports compliance with HEP and has improved some stability with ability to SLS > 10" BLE.  Pt stated the pain is worse at night with average range of 3/10 during day and increased to 8/10 at night.  LE strength is progressing as well as functional strength.  5 STS complete in 19.25" (was 19.78" initial eval).  EOS with manual to address soft tissue restrictions Rt gluteal region and lower back.  No reports of increased pain through session.    Rehab Potential  Fair    Clinical Impairments Affecting Rehab Potential  Positive: Motivated, positive attitude. Negative: Chronicity of issue    PT Frequency  2x / week    PT Duration  4 weeks    PT Treatment/Interventions  ADLs/Self Care Home Management;Gait training;Stair training;Functional mobility training;Therapeutic activities;Therapeutic exercise;Balance training;Neuromuscular  re-education;Patient/family education;Orthotic Fit/Training;Manual techniques;Passive range of motion;Dry needling;Energy conservation;Taping;Splinting    PT Next Visit Plan  F/U with MD.  Continue to follow-up about tingling/numbness , F/U with compliance wiht HEP. Add high knee with step ups for core activation next session. Continue exercises to improve lumbar stabilization/TA activation, initiate functional lower extremity strengthening. Soft tissue mobilization as needed for pain.     PT Home Exercise Plan  6/6: TA activation, bent knee raise, bridge       Patient will benefit from skilled therapeutic intervention in order to improve the following deficits and impairments:  Abnormal gait, Decreased range of motion, Difficulty walking, Decreased activity tolerance, Pain, Decreased balance, Hypomobility, Improper body mechanics, Decreased mobility, Decreased strength, Postural dysfunction  Visit Diagnosis: Chronic bilateral low back pain, with sciatica presence unspecified  Other symptoms and signs involving the musculoskeletal system  Other abnormalities of gait and mobility  Muscle weakness (generalized)     Problem List Patient Active Problem List   Diagnosis Date Noted  . Screening for colorectal cancer 09/01/2017  . Encounter for gynecological examination with Papanicolaou smear of  cervix 09/01/2017  . Neoplasm of right kidney 05/16/2017  . Superficial fungus infection of skin 08/31/2016  . Hyperlipidemia LDL goal <100 09/18/2015  . History of stroke 09/18/2015  . CVA (cerebral infarction) 07/02/2015  . Menopause 08/27/2014  . Meralgia paresthetica of left side 04/05/2014  . Venous stasis of both lower extremities 04/05/2014  . Radicular low back pain 11/07/2013  . Leg weakness, bilateral 11/07/2013  . Lumbago 10/09/2013  . Posterior pain of right hip 07/31/2013  . Esophageal reflux 01/25/2013  . Essential hypertension, benign 01/25/2013  . Rectal bleed 01/28/2012  .  Constipation 01/28/2012  . Pain in joint, shoulder region 08/02/2011  . Muscle weakness (generalized) 08/02/2011  . Status post rotator cuff repair 08/02/2011  . FOOT PAIN 12/22/2009  . 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 POPLITEAL CYST, LEFT 06/30/2009  . MEDIAL MENISCUS TEAR, LEFT 06/30/2009   Ihor Austin, LPTA; [facility] [phone]  Aldona Lento 09/27/2017, 1:01 PM  [place], [name], [address] Phone: [phone]   Fax:  [phone]  Name: Elycia Woodside MRN: [id number] Date of Birth: 1960/11/12

## 2017-09-29 ENCOUNTER — Encounter (HOSPITAL_COMMUNITY): Payer: Self-pay | Admitting: Physical Therapy

## 2017-09-29 ENCOUNTER — Ambulatory Visit (HOSPITAL_COMMUNITY): Payer: 59 | Admitting: Physical Therapy

## 2017-09-29 DIAGNOSIS — M545 Low back pain: Principal | ICD-10-CM

## 2017-09-29 DIAGNOSIS — R29898 Other symptoms and signs involving the musculoskeletal system: Secondary | ICD-10-CM

## 2017-09-29 DIAGNOSIS — M6281 Muscle weakness (generalized): Secondary | ICD-10-CM

## 2017-09-29 DIAGNOSIS — G8929 Other chronic pain: Secondary | ICD-10-CM

## 2017-09-29 DIAGNOSIS — R2689 Other abnormalities of gait and mobility: Secondary | ICD-10-CM

## 2017-09-29 NOTE — Therapy (Addendum)
[facility] [address] [place], [name], [address] Phone: [phone]   Fax:  [phone]  Physical Therapy Treatment  Patient Details  Name: Jo Hale MRN: [id number] Date of Birth: [birth year] Referring Provider: Suella Broad, MD   Encounter Date: 09/29/2017    09/29/17 0906  PT Visits / Re-Eval  Visit Number 6  Number of Visits 9  Date for PT Re-Evaluation 10/14/17  Authorization  Authorization Type [place] (No auth. 60 visit limit PT/OT/ST)  Authorization Time Period 6/5-->10/14/17  Authorization - Visit Number 6  Authorization - Number of Visits 60 (60 visits PT/OT/SLP)  PT Time Calculation  PT Start Time 0901  PT Stop Time 0940  PT Time Calculation (min) 39 min  PT - End of Session  Activity Tolerance Patient tolerated treatment well;No increased pain;Patient limited by fatigue  Behavior During Therapy [facility] for tasks assessed/performed    Past Medical History:  Diagnosis Date  . Abnormal pap   . Fibroids   . Fibroids, intramural    450-600 gm uterus  . GERD (gastroesophageal reflux disease)   . Hyperlipidemia   . Hypertension   . Menopause 08/27/2014  . Neoplasm of kidney   . PONV (postoperative nausea and vomiting)   . Posterior pain of right hip 07/31/2013  . Renal cancer, right ([place]) 05/2017  . TIA (transient ischemic attack) 2017 or 2018 unsure  . Vaginal Pap smear, abnormal     Past Surgical History:  Procedure Laterality Date  . CESAREAN SECTION    . COLONOSCOPY  02/19/2011   Fields-incomplete exam, otherwise normal   . LAPAROSCOPIC SUPRACERVICAL HYSTERECTOMY  03/02/2011   Procedure: LAPAROSCOPIC SUPRACERVICAL HYSTERECTOMY;  Surgeon: Jonnie Kind, MD;  Location: [facility];  Service: Gynecology;  Laterality: N/A;  . PARTIAL HYSTERECTOMY    . ROBOT ASSISTED LAPAROSCOPIC NEPHRECTOMY Right 05/16/2017   Procedure: XI ROBOTIC ASSISTED LAPAROSCOPIC PARTIAL NEPHRECTOMY;  Surgeon: Raynelle Bring,  MD;  Location: [facility];  Service: Urology;  Laterality: Right;  . ROTATOR CUFF REPAIR     left  . TUBAL LIGATION      There were no vitals filed for this visit.  Subjective Assessment - 09/29/17 0904    Subjective  Patient stated that she has had 3/10 pain this session. She stated her pain is worse at night. Patient reported that she saw her physician and wrote her a referral for a consult with a surgeon.    Pertinent History  Partial Right Nephrectomy 05/16/17, History of cancer, Fall in 2015    Patient Stated Goals  Less pain    Currently in Pain?  Yes    Pain Score  3     Pain Location  Back    Pain Orientation  Right;Lower    Pain Descriptors / Indicators  Constant;Shooting    Pain Type  Chronic pain    Pain Onset  More than a month ago                       [facility] Adult PT Treatment/Exercise - 09/29/17 0001      Lumbar Exercises: Machines for Strengthening   Other Lumbar Machine Exercise  power tower double leg squats 25 degrees 15x      Lumbar Exercises: Standing   Other Standing Lumbar Exercises  Forward step ups onto 6'' step with opposite knee drive 2 x 10 each lower extremity. Vector stance on foam 5x 5"; 2 sets; sidestep with RTB 2RT; step up  with 6in 10x BLE    Other Standing Lumbar Exercises  Palloff press with red theraband tandem stance on foam x20 each lower extremity forward       Lumbar Exercises: Supine   Other Supine Lumbar Exercises  Decompression exercises level 1: Relax on back knees bent palms up x 3 minutes focusing on breathing; shoulder press into table x 5 holding for 3 seconds; Head press 5x 3'' holds; Leg lengthener 5 x 3'' holds each leg; Leg press 5x 3'' holds each lower extremity             PT Education - 09/29/17 0905    Education Details  Patient was educated on purpose and technique of exercises throughout session. Updated HEP with decompression exercises level 1.     Person(s) Educated  Patient    Methods   Explanation;Verbal cues    Comprehension  Verbalized understanding;Returned demonstration       PT Short Term Goals - 09/27/17 0924      PT SHORT TERM GOAL #1   Title  Patient will demonstrate understanding and report regular compliance with HEP in order to improve lower extremity strength, trunk stability, balance, and overall functional mobility.     Baseline  09/27/17: Reports compliance daily, able to verbalize exercise    Status  Achieved      PT SHORT TERM GOAL #2   Title  Patient will report low back pain of no greater than a 7/10 over a 1 week period indicating better tolerance of daily activities.     Baseline  09/27/17: average pain scale 3-->8/10, worse at night      PT SHORT TERM GOAL #3   Title  Patient will report being able to stand for 20 minutes in order to be able to prepare a meal.     Baseline  09/27/17: Able to stand for 10 minutes prior increased pain     Status  On-going        PT Long Term Goals - 09/27/17 4580      PT LONG TERM GOAL #1   Title  Patient will demonstrate ability to perform single limb stance for 10 seconds on each lower extremity indicating improved balance and safety with stair negotiation.     Baseline  09/27/17:  Lt 14", Rt 16"    Status  Achieved      PT LONG TERM GOAL #2   Title  Patient will deomonstrate ability to perform the 5 x STS in 13 seconds or less indicating improved balance as well as functional strength.     Baseline  09/27/17: 5 STS 19.25"    Status  On-going      PT LONG TERM GOAL #3   Title  Patient will demonstrate improved MMT strength of at least 1/2 grade in all tested musculature in order to improve ease of stair navigation and overall functional mobility.     Status  On-going      PT LONG TERM GOAL #4   Title  Patient will report no greater than a 3/10 pain over the course of a 1 week period indicating better tolerance to daily activities.     Status  Not Met            Plan - 09/29/17 0937    Clinical  Impression Statement  This session continued to progress patient with lower extremity strengthening exercises, balance exercises, and abdominal strengthening and lumbar stabilization. This session added step ups with opposite knee drive  for abdominal activation. This session also educated patient on decompression exercises level 1 and added them to the patient's home exercise program. Patient reported that she is going to a consult with a surgeon on Tuesday and plan to follow-up about this.     Rehab Potential  Fair    Clinical Impairments Affecting Rehab Potential  Positive: Motivated, positive attitude. Negative: Chronicity of issue    PT Frequency  2x / week    PT Duration  4 weeks    PT Treatment/Interventions  ADLs/Self Care Home Management;Gait training;Stair training;Functional mobility training;Therapeutic activities;Therapeutic exercise;Balance training;Neuromuscular re-education;Patient/family education;Orthotic Fit/Training;Manual techniques;Passive range of motion;Dry needling;Energy conservation;Taping;Splinting    PT Next Visit Plan  Follow up regarding surgeon appointment on Tuesday. Review decompression exercises level 1.  Continue to follow-up about tingling/numbness , F/U with compliance wiht HEP. Continue exercises to improve lumbar stabilization/TA activation, initiate functional lower extremity strengthening. Soft tissue mobilization as needed for pain.     PT Home Exercise Plan  6/6: TA activation, bent knee raise, bridge; 09/29/17: Decompression exercises level 1: 5 minutes relax on back, shoulder press 5 x 3 seconds, head press 5 x 3 seconds; leg lengthener 5x 3'' holds each leg; leg press 5 x 3'' holds each leg 1 x/day       Patient will benefit from skilled therapeutic intervention in order to improve the following deficits and impairments:  Abnormal gait, Decreased range of motion, Difficulty walking, Decreased activity tolerance, Pain, Decreased balance, Hypomobility, Improper  body mechanics, Decreased mobility, Decreased strength, Postural dysfunction  Visit Diagnosis: Chronic bilateral low back pain, with sciatica presence unspecified  Other symptoms and signs involving the musculoskeletal system  Other abnormalities of gait and mobility  Muscle weakness (generalized)     Problem List Patient Active Problem List   Diagnosis Date Noted  . Screening for colorectal cancer 09/01/2017  . Encounter for gynecological examination with Papanicolaou smear of cervix 09/01/2017  . Neoplasm of right kidney 05/16/2017  . Superficial fungus infection of skin 08/31/2016  . Hyperlipidemia LDL goal <100 09/18/2015  . History of stroke 09/18/2015  . CVA (cerebral infarction) 07/02/2015  . Menopause 08/27/2014  . Meralgia paresthetica of left side 04/05/2014  . Venous stasis of both lower extremities 04/05/2014  . Radicular low back pain 11/07/2013  . Leg weakness, bilateral 11/07/2013  . Lumbago 10/09/2013  . Posterior pain of right hip 07/31/2013  . Esophageal reflux 01/25/2013  . Essential hypertension, benign 01/25/2013  . Rectal bleed 01/28/2012  . Constipation 01/28/2012  . Pain in joint, shoulder region 08/02/2011  . Muscle weakness (generalized) 08/02/2011  . Status post rotator cuff repair 08/02/2011  . FOOT PAIN 12/22/2009  . 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 POPLITEAL CYST, LEFT 06/30/2009  . MEDIAL MENISCUS TEAR, LEFT 06/30/2009   Clarene Critchley PT, DPT 9:43 AM, 09/29/17 [place], [name], [address] Phone: [phone]   Fax:  [phone]  Name: Jo Hale MRN: [id number] Date of Birth: [birth year]

## 2017-10-04 ENCOUNTER — Encounter (HOSPITAL_COMMUNITY): Payer: Self-pay | Admitting: Physical Therapy

## 2017-10-04 ENCOUNTER — Ambulatory Visit (HOSPITAL_COMMUNITY): Payer: 59 | Admitting: Physical Therapy

## 2017-10-04 DIAGNOSIS — R2689 Other abnormalities of gait and mobility: Secondary | ICD-10-CM

## 2017-10-04 DIAGNOSIS — G8929 Other chronic pain: Secondary | ICD-10-CM

## 2017-10-04 DIAGNOSIS — R29898 Other symptoms and signs involving the musculoskeletal system: Secondary | ICD-10-CM

## 2017-10-04 DIAGNOSIS — M545 Low back pain: Principal | ICD-10-CM

## 2017-10-04 DIAGNOSIS — M6281 Muscle weakness (generalized): Secondary | ICD-10-CM

## 2017-10-04 NOTE — Therapy (Signed)
[facility] [place], [name], [address] Phone: [phone]   Fax:  [phone]  Physical Therapy Treatment  Patient Details  Name: Jo Hale MRN: [id number] Date of Birth: Jun 09, 1960 Referring Provider: Suella Broad, MD   Encounter Date: 10/04/2017  PT End of Session - 10/04/17 0916    Visit Number  7    Number of Visits  9    Date for PT Re-Evaluation  10/14/17    Authorization Type  [place] (No auth. 60 visit limit PT/OT/ST)    Authorization Time Period  6/5-->10/14/17    Authorization - Visit Number  7    Authorization - Number of Visits  60 60 visits PT/OT/SLP    PT Start Time  0903    PT Stop Time  0944    PT Time Calculation (min)  41 min    Activity Tolerance  Patient tolerated treatment well;No increased pain;Patient limited by fatigue    Behavior During Therapy  [facility] for tasks assessed/performed       Past Medical History:  Diagnosis Date  . Abnormal pap   . Fibroids   . Fibroids, intramural    450-600 gm uterus  . GERD (gastroesophageal reflux disease)   . Hyperlipidemia   . Hypertension   . Menopause 08/27/2014  . Neoplasm of kidney   . PONV (postoperative nausea and vomiting)   . Posterior pain of right hip 07/31/2013  . Renal cancer, right ([place]) 05/2017  . TIA (transient ischemic attack) 2017 or 2018 unsure  . Vaginal Pap smear, abnormal     Past Surgical History:  Procedure Laterality Date  . CESAREAN SECTION    . COLONOSCOPY  02/19/2011   Fields-incomplete exam, otherwise normal   . LAPAROSCOPIC SUPRACERVICAL HYSTERECTOMY  03/02/2011   Procedure: LAPAROSCOPIC SUPRACERVICAL HYSTERECTOMY;  Surgeon: Jonnie Kind, MD;  Location: [facility];  Service: Gynecology;  Laterality: N/A;  . PARTIAL HYSTERECTOMY    . ROBOT ASSISTED LAPAROSCOPIC NEPHRECTOMY Right 05/16/2017   Procedure: XI ROBOTIC ASSISTED LAPAROSCOPIC PARTIAL NEPHRECTOMY;  Surgeon: Raynelle Bring, MD;  Location: [facility];   Service: Urology;  Laterality: Right;  . ROTATOR CUFF REPAIR     left  . TUBAL LIGATION      There were no vitals filed for this visit.  Subjective Assessment - 10/04/17 0906    Subjective  Patient stated that she has been doing the decompression exercises at night when she is having pain and that they have helped a lot. Patient reported pain that is a 3 or 4/10 this session.     Pertinent History  Partial Right Nephrectomy 05/16/17, History of cancer, Fall in 2015    Patient Stated Goals  Less pain    Currently in Pain?  Yes    Pain Score  4     Pain Location  Back    Pain Orientation  Right;Lower    Pain Descriptors / Indicators  Nagging    Pain Type  Chronic pain    Pain Onset  More than a month ago                       [facility] Adult PT Treatment/Exercise - 10/04/17 0001      Lumbar Exercises: Machines for Strengthening   Other Lumbar Machine Exercise  power tower double leg squats 26 degrees 15x      Lumbar Exercises: Standing   Other Standing Lumbar Exercises  Standing hip extension with red  theraband 2 x 10 each lower extremity. Forward step ups onto 6'' step with opposite knee drive 2 x 10 each lower extremity with verbal cues to tighten abdominals. Vector stance on foam 5x 5"; 2 sets; sidestep with RTB 14 feet 2 roundtrips    Other Standing Lumbar Exercises  Palloff press with red theraband tandem stance on foam x20 each lower extremity forward       Lumbar Exercises: Supine   Other Supine Lumbar Exercises  Decompression exercises level 1: Relax on back knees bent palms up x 3 minutes focusing on breathing; shoulder press into table x 5 holding for 3 seconds; Head press 5x 3'' holds; Leg lengthener 5 x 3'' holds each leg; Leg press 5x 3'' holds each lower extremity             PT Education - 10/04/17 0915    Education Details  Patient was educated on the purpose and technique of exercises throughout the session. Patient was reminded of re-assessment in  next couple visits.     Person(s) Educated  Patient    Methods  Explanation    Comprehension  Verbalized understanding       PT Short Term Goals - 09/27/17 0924      PT SHORT TERM GOAL #1   Title  Patient will demonstrate understanding and report regular compliance with HEP in order to improve lower extremity strength, trunk stability, balance, and overall functional mobility.     Baseline  09/27/17: Reports compliance daily, able to verbalize exercise    Status  Achieved      PT SHORT TERM GOAL #2   Title  Patient will report low back pain of no greater than a 7/10 over a 1 week period indicating better tolerance of daily activities.     Baseline  09/27/17: average pain scale 3-->8/10, worse at night      PT SHORT TERM GOAL #3   Title  Patient will report being able to stand for 20 minutes in order to be able to prepare a meal.     Baseline  09/27/17: Able to stand for 10 minutes prior increased pain     Status  On-going        PT Long Term Goals - 09/27/17 0973      PT LONG TERM GOAL #1   Title  Patient will demonstrate ability to perform single limb stance for 10 seconds on each lower extremity indicating improved balance and safety with stair negotiation.     Baseline  09/27/17:  Lt 14", Rt 16"    Status  Achieved      PT LONG TERM GOAL #2   Title  Patient will deomonstrate ability to perform the 5 x STS in 13 seconds or less indicating improved balance as well as functional strength.     Baseline  09/27/17: 5 STS 19.25"    Status  On-going      PT LONG TERM GOAL #3   Title  Patient will demonstrate improved MMT strength of at least 1/2 grade in all tested musculature in order to improve ease of stair navigation and overall functional mobility.     Status  On-going      PT LONG TERM GOAL #4   Title  Patient will report no greater than a 3/10 pain over the course of a 1 week period indicating better tolerance to daily activities.     Status  Not Met  Plan  - 10/04/17 0954    Clinical Impression Statement  This session continued to progress patient with lumbar stabilization exercises and with lower extremity strengthening as well as balance. This session, progressed patient with strengthening by increasing the difficulty on the power tower to 26 degrees this session. This session also had patient performing standing hip extension exercises with red theraband. Patient reported that she would be consulting a surgeon today and that she plans to let the therapist know what they say later this week. Plan to re-assess patient in next couple sessions.     Rehab Potential  Fair    Clinical Impairments Affecting Rehab Potential  Positive: Motivated, positive attitude. Negative: Chronicity of issue    PT Frequency  2x / week    PT Duration  4 weeks    PT Treatment/Interventions  ADLs/Self Care Home Management;Gait training;Stair training;Functional mobility training;Therapeutic activities;Therapeutic exercise;Balance training;Neuromuscular re-education;Patient/family education;Orthotic Fit/Training;Manual techniques;Passive range of motion;Dry needling;Energy conservation;Taping;Splinting    PT Next Visit Plan  Follow up regarding surgeon appointment on Tuesday. Review decompression exercises level 1.  Continue to follow-up about tingling/numbness , F/U with compliance wiht HEP. Continue exercises to improve lumbar stabilization/TA activation, initiate functional lower extremity strengthening. Soft tissue mobilization as needed for pain.     PT Home Exercise Plan  6/6: TA activation, bent knee raise, bridge; 09/29/17: Decompression exercises level 1: 5 minutes relax on back, shoulder press 5 x 3 seconds, head press 5 x 3 seconds; leg lengthener 5x 3'' holds each leg; leg press 5 x 3'' holds each leg 1 x/day       Patient will benefit from skilled therapeutic intervention in order to improve the following deficits and impairments:  Abnormal gait, Decreased range of  motion, Difficulty walking, Decreased activity tolerance, Pain, Decreased balance, Hypomobility, Improper body mechanics, Decreased mobility, Decreased strength, Postural dysfunction  Visit Diagnosis: Chronic bilateral low back pain, with sciatica presence unspecified  Other symptoms and signs involving the musculoskeletal system  Other abnormalities of gait and mobility  Muscle weakness (generalized)     Problem List Patient Active Problem List   Diagnosis Date Noted  . Screening for colorectal cancer 09/01/2017  . Encounter for gynecological examination with Papanicolaou smear of cervix 09/01/2017  . Neoplasm of right kidney 05/16/2017  . Superficial fungus infection of skin 08/31/2016  . Hyperlipidemia LDL goal <100 09/18/2015  . History of stroke 09/18/2015  . CVA (cerebral infarction) 07/02/2015  . Menopause 08/27/2014  . Meralgia paresthetica of left side 04/05/2014  . Venous stasis of both lower extremities 04/05/2014  . Radicular low back pain 11/07/2013  . Leg weakness, bilateral 11/07/2013  . Lumbago 10/09/2013  . Posterior pain of right hip 07/31/2013  . Esophageal reflux 01/25/2013  . Essential hypertension, benign 01/25/2013  . Rectal bleed 01/28/2012  . Constipation 01/28/2012  . Pain in joint, shoulder region 08/02/2011  . Muscle weakness (generalized) 08/02/2011  . Status post rotator cuff repair 08/02/2011  . FOOT PAIN 12/22/2009  . 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 POPLITEAL CYST, LEFT 06/30/2009  . MEDIAL MENISCUS TEAR, LEFT 06/30/2009   Macy Gill PT, DPT 9:56 AM, 10/04/17 [phone]   [facility] [address] [place], [place], [address] Phone: [phone]   Fax:  [phone]  Name: Celena Havener MRN: [id number] Date of Birth: 07/23/1960   

## 2017-10-06 ENCOUNTER — Encounter (HOSPITAL_COMMUNITY): Payer: Self-pay

## 2017-10-06 ENCOUNTER — Ambulatory Visit (HOSPITAL_COMMUNITY): Payer: 59

## 2017-10-06 ENCOUNTER — Other Ambulatory Visit: Payer: Self-pay

## 2017-10-06 DIAGNOSIS — R2689 Other abnormalities of gait and mobility: Secondary | ICD-10-CM

## 2017-10-06 DIAGNOSIS — M545 Low back pain: Secondary | ICD-10-CM | POA: Diagnosis not present

## 2017-10-06 DIAGNOSIS — G8929 Other chronic pain: Secondary | ICD-10-CM

## 2017-10-06 DIAGNOSIS — R29898 Other symptoms and signs involving the musculoskeletal system: Secondary | ICD-10-CM

## 2017-10-06 DIAGNOSIS — M6281 Muscle weakness (generalized): Secondary | ICD-10-CM

## 2017-10-06 NOTE — Therapy (Signed)
[facility] [address] [place], [name], [address] Phone: [phone]   Fax:  [phone]  Physical Therapy Treatment/Discharge Summary  Patient Details  Name: Jo Hale MRN: [id number] Date of Birth: Sep 01, 1960 Referring Provider: Suella Broad, MD   Encounter Date: 10/06/2017   Progress Note Reporting Period 09/14/17 to 10/06/17  See note below for Objective Data and Assessment of Progress/Goals.    PHYSICAL THERAPY DISCHARGE SUMMARY  Visits from Start of Care: 8  Current functional level related to goals / functional outcomes: Re-assessment performed today and patient has made limited progress in therapy with ROM and functional strength. She continues to have back pain that is more severe at night but denies night sweats, weight loss/gain, and reports she is able to go back to sleep after repositioning. She has been evaluated by Dr. Rolena Infante for her back pain and he informed her there were no abnormal findings on her x-rays. He did recommend she return to Dr. Nelva Bush to follow up on another injection or nerve ablation to address her pain. He also recommended she begin participating in aquatic therapy and gave her a prescription for 3 visits and two offices in [place] that provide this. I educated her that our office is beginning to provide aquatic therapy 1x/week and that if she would like to continue here she can however we do not have any openings available for the aquatic therapy this coming week. I encouraged her to update her neurologist about her Rt sided back pain as well. After a discussion on options for her to continue therapy she has decided to take a break and follow up with Dr. Nelva Bush and then begin aquatic therapy at a [facility] location or return her to begin aquatic therapy 1x/week. She will be discharged following this session.   Remaining deficits: See below details   Education / Equipment: Educated on options to  continue therapy now or discharge and follow up with Dr. Nelva Bush and then participate in aquatic therpay as recommended by Dr. Rolena Infante at the clinics in [place] or here. Educated her on how to use self traction device that patient purchased to relieve low back pain.  Plan: Patient agrees to discharge.  Patient goals were not met. Patient is being discharged due to meeting the stated rehab goals.  ?????       PT End of Session - 10/06/17 1057    Visit Number  8    Number of Visits  9    Date for PT Re-Evaluation  10/14/17    Authorization Type  [employer] (No auth. 60 visit limit PT/OT/ST)    Authorization Time Period  6/5-->10/14/17    Authorization - Visit Number  8    Authorization - Number of Visits  60 60 visits PT/OT/SLP    PT Start Time  0321    PT Stop Time  1110 d/c    PT Time Calculation (min)  36 min    Activity Tolerance  Patient tolerated treatment well    Behavior During Therapy  WFL for tasks assessed/performed       Past Medical History:  Diagnosis Date  . Abnormal pap   . Fibroids   . Fibroids, intramural    450-600 gm uterus  . GERD (gastroesophageal reflux disease)   . Hyperlipidemia   . Hypertension   . Menopause 08/27/2014  . Neoplasm of kidney   . PONV (postoperative nausea and vomiting)   . Posterior pain of right hip 07/31/2013  .  Renal cancer, right (HCC) 05/2017  . TIA (transient ischemic attack) 2017 or 2018 unsure  . Vaginal Pap smear, abnormal     Past Surgical History:  Procedure Laterality Date  . CESAREAN SECTION    . COLONOSCOPY  02/19/2011   Fields-incomplete exam, otherwise normal   . LAPAROSCOPIC SUPRACERVICAL HYSTERECTOMY  03/02/2011   Procedure: LAPAROSCOPIC SUPRACERVICAL HYSTERECTOMY;  Surgeon: John V Ferguson, MD;  Location: [facility];  Service: Gynecology;  Laterality: N/A;  . PARTIAL HYSTERECTOMY    . ROBOT ASSISTED LAPAROSCOPIC NEPHRECTOMY Right 05/16/2017   Procedure: XI ROBOTIC ASSISTED LAPAROSCOPIC PARTIAL NEPHRECTOMY;   Surgeon: Borden, Lester, MD;  Location: [facility];  Service: Urology;  Laterality: Right;  . ROTATOR CUFF REPAIR     left  . TUBAL LIGATION      There were no vitals filed for this visit.  Subjective Assessment - 10/06/17 1037    Subjective  Patient continues to report pain is worse at night but denies night sweats, rapid weight gain/loss, and reports she is able to fall back asleep after repositioning if she wakes up due to pain. She has seen Dr. Brooks for her back and he did not finds anything remarkable on her x-rays but recommended she follow up with Dr. Ramos for injections and possibly a nerve ablation. Dr. Brooks also recommended she start aquatic therapy and gave her two offices in [place] that provide aquatic PT for her to participate in. She also brought in a self lumbar distraction device to ask how to use it.    Pertinent History  Partial Right Nephrectomy 05/16/17, History of cancer, Fall in 2015    Limitations  Walking;Standing;Lifting;House hold activities    How long can you sit comfortably?  10-15 minutes    How long can you stand comfortably?  10 minutes    How long can you walk comfortably?  10 minutes    Diagnostic tests  x-ray cervical spine 06/02/17: moderate degenerative disc disease and spondylosis at C5-6. CT abdomen and pelvis 02/12/17: renal cell carcinoma exophytic from the right mid kidney. MRI Lumbar spine 10/11/14: Moderate severe right facet arthritis at L5-S1. Lumbar x-ray 10/09/13: No evidence of spondylolyses or listhesis    Patient Stated Goals  Less pain    Currently in Pain?  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, lumbar region    Referring Provider  Richard Ramos, MD    Next MD Visit  09/27/17    Prior Therapy  Yes, for hips and legs      Precautions   Precautions  None      Restrictions   Weight Bearing Restrictions  No      Prior Function   Level of Independence  Independent;Independent with basic ADLs      Cognition   Overall Cognitive Status  Within Functional Limits for tasks assessed        Observation/Other Assessments   Focus on Therapeutic Outcomes (FOTO)   56% limited on 09/27/17 was 53% limited on 09/14/17      Posture/Postural Control   Posture/Postural Control  Postural limitations    Postural Limitations  Rounded Shoulders;Anterior pelvic tilt      AROM   Lumbar Flexion  50    Lumbar Extension  20    Lumbar - Right Side Bend  12    Lumbar - Left Side Bend  16    Lumbar - Right Rotation  8 WFL    Lumbar - Left Rotation  8 WFL      Strength   Right Hip Flexion  4/5 was 4/5    Right Hip Extension  4/5 was 3+/5    Right Hip ABduction  4+/5 was 4+    Left Hip Flexion  4+/5 was 4/5    Left Hip Extension  4/5 was 3/5    Left Hip ABduction  4+/5 was 4/5    Right Knee Flexion  5/5    Right Knee Extension  5/5 was 4+/5    Left Knee Flexion  5/5    Left Knee Extension  5/5    Right Ankle Dorsiflexion  5/5 was 4+/5    Left Ankle Dorsiflexion  5/5 was 4+/5      Transfers   Five time sit to stand comments   19.8 seconds without UE use was 19.72 with some use of hands         PT Education - 10/06/17 1056    Education Details  Educated on options to continue therapy now or discharge and follow up with Dr. Ramos and then participate in aquatic therpay as recommended by Dr. Brooks at the clinics in [place] or here. Educated her on how to use self traction device that patient purchased to relieve low back pain.    Person(s) Educated  Patient    Methods  Explanation     Comprehension  Verbalized understanding       PT Short Term Goals - 10/06/17 1058      PT SHORT TERM GOAL #1   Title  Patient will demonstrate understanding and report regular compliance with HEP in order to improve lower extremity strength, trunk stability, balance, and overall functional mobility.     Baseline  09/27/17: Reports compliance daily, able to verbalize exercise    Status  Achieved      PT SHORT TERM GOAL #2   Title  Patient will report low back pain of no greater than a 7/10 over a 1 week period indicating better tolerance of daily activities.     Baseline  09/27/17: average pain scale 3-->8/10, worse at night    Status  Not Met      PT SHORT TERM GOAL #3   Title  Patient will report being able to stand for 20 minutes in order to be able to prepare a meal.     Baseline  10/06/17: Able to stand for 10 minutes prior increased pain     Status  Not Met        PT Long Term Goals - 10/06/17 1049      PT LONG TERM GOAL #1   Title  Patient will demonstrate ability to perform single limb stance for 10 seconds on each lower extremity indicating improved balance and safety with stair negotiation.     Baseline  09/27/17:  Lt 14", Rt 16"    Status  Achieved        PT LONG TERM GOAL #2   Title  Patient will deomonstrate ability to perform the 5 x STS in 13 seconds or less indicating improved balance as well as functional strength.     Baseline  10/06/17 - 19.8    Status  Not Met      PT LONG TERM GOAL #3   Title  Patient will demonstrate improved MMT strength of at least 1/2 grade in all tested musculature in order to improve ease of stair navigation and overall functional mobility.     Status  Partially Met      PT LONG TERM GOAL #4   Title  Patient will report no greater than a 3/10 pain over the course of a 1 week period indicating better tolerance to daily activities.     Status  Not Met        Plan - 10/06/17 1215    Clinical Impression Statement  Re-assessment performed  today and patient has made limited progress in therapy with ROM and functional strength. She continues to have back pain that is more severe at night but denies night sweats, weight loss/gain, and reports she is able to go back to sleep after repositioning. She has been evaluated by Dr. Rolena Infante for her back pain and he informed her there were no abnormal findings on her x-rays. He did recommend she return to Dr. Nelva Bush to follow up on another injection or nerve ablation to address her pain. He also recommended she begin participating in aquatic therapy and gave her a prescription for 3 visits and two offices in [place] that provide this. I educated her that our office is beginning to provide aquatic therapy 1x/week and that if she would like to continue here she can however we do not have any openings available for the aquatic therapy this coming week. I encouraged her to update her neurologist about her Rt sided back pain as well. After a discussion on options for her to continue therapy she has decided to take a break and follow up with Dr. Nelva Bush and then begin aquatic therapy at a [facility] location or return her to begin aquatic therapy 1x/week. She will be discharged following this session.    Rehab Potential  Fair    Clinical Impairments Affecting Rehab Potential  Positive: Motivated, positive attitude. Negative: Chronicity of issue    PT Frequency  2x / week    PT Duration  4 weeks    PT Treatment/Interventions  ADLs/Self Care Home Management;Gait training;Stair training;Functional mobility training;Therapeutic activities;Therapeutic exercise;Balance training;Neuromuscular re-education;Patient/family education;Orthotic Fit/Training;Manual techniques;Passive range of motion;Dry needling;Energy conservation;Taping;Splinting    PT Next Visit Plan  Discharging this session.    PT Home Exercise Plan  6/6: TA activation, bent knee raise, bridge; 09/29/17: Decompression exercises level 1: 5 minutes relax on  back, shoulder press 5 x 3 seconds, head press 5 x 3 seconds; leg lengthener 5x 3'' holds each leg; leg press 5 x 3'' holds each leg 1 x/day    Consulted and Agree with Plan of Care  Patient       Patient will benefit from skilled therapeutic intervention in order to improve the following deficits and impairments:  Abnormal gait, Decreased range of motion, Difficulty walking, Decreased activity tolerance, Pain, Decreased balance, Hypomobility, Improper body mechanics, Decreased mobility, Decreased strength, Postural dysfunction  Visit Diagnosis: Chronic bilateral low back pain, with sciatica presence unspecified  Other symptoms and signs involving the musculoskeletal system  Other abnormalities of gait and mobility  Muscle weakness (  generalized)     Problem List Patient Active Problem List   Diagnosis Date Noted  . Screening for colorectal cancer 09/01/2017  . Encounter for gynecological examination with Papanicolaou smear of cervix 09/01/2017  . Neoplasm of right kidney 05/16/2017  . Superficial fungus infection of skin 08/31/2016  . Hyperlipidemia LDL goal <100 09/18/2015  . History of stroke 09/18/2015  . CVA (cerebral infarction) 07/02/2015  . Menopause 08/27/2014  . Meralgia paresthetica of left side 04/05/2014  . Venous stasis of both lower extremities 04/05/2014  . Radicular low back pain 11/07/2013  . Leg weakness, bilateral 11/07/2013  . Lumbago 10/09/2013  . Posterior pain of right hip 07/31/2013  . Esophageal reflux 01/25/2013  . Essential hypertension, benign 01/25/2013  . Rectal bleed 01/28/2012  . Constipation 01/28/2012  . Pain in joint, shoulder region 08/02/2011  . Muscle weakness (generalized) 08/02/2011  . Status post rotator cuff repair 08/02/2011  . FOOT PAIN 12/22/2009  . 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 POPLITEAL CYST, LEFT 06/30/2009  . MEDIAL MENISCUS TEAR, LEFT 06/30/2009    Rachel Quinn-Brown, PT, DPT Physical Therapist with [facility]  10/06/2017 12:24 PM    [facility] [address] [place], [place], [address] Phone: [phone]   Fax:  [phone]  Name: Jo Hale MRN: [id number] Date of Birth: 02/28/1961   

## 2017-10-12 DIAGNOSIS — M545 Low back pain: Secondary | ICD-10-CM | POA: Diagnosis not present

## 2017-10-19 DIAGNOSIS — M545 Low back pain: Secondary | ICD-10-CM | POA: Diagnosis not present

## 2017-10-21 DIAGNOSIS — M545 Low back pain: Secondary | ICD-10-CM | POA: Diagnosis not present

## 2017-10-25 DIAGNOSIS — M545 Low back pain: Secondary | ICD-10-CM | POA: Diagnosis not present

## 2017-10-27 DIAGNOSIS — M545 Low back pain: Secondary | ICD-10-CM | POA: Diagnosis not present

## 2017-11-29 DIAGNOSIS — M47816 Spondylosis without myelopathy or radiculopathy, lumbar region: Secondary | ICD-10-CM | POA: Diagnosis not present

## 2017-12-05 DIAGNOSIS — Z029 Encounter for administrative examinations, unspecified: Secondary | ICD-10-CM

## 2017-12-15 ENCOUNTER — Other Ambulatory Visit: Payer: Self-pay | Admitting: Family Medicine

## 2017-12-29 ENCOUNTER — Other Ambulatory Visit: Payer: Self-pay | Admitting: Urology

## 2017-12-29 ENCOUNTER — Ambulatory Visit (HOSPITAL_COMMUNITY)
Admission: RE | Admit: 2017-12-29 | Discharge: 2017-12-29 | Disposition: A | Discharge status: Home / Self Care | Payer: 59 | Source: Ambulatory Visit | Attending: Urology | Admitting: Urology

## 2017-12-29 DIAGNOSIS — C641 Malignant neoplasm of right kidney, except renal pelvis: Secondary | ICD-10-CM

## 2017-12-29 DIAGNOSIS — R079 Chest pain, unspecified: Secondary | ICD-10-CM | POA: Diagnosis not present

## 2018-01-03 DIAGNOSIS — C641 Malignant neoplasm of right kidney, except renal pelvis: Secondary | ICD-10-CM | POA: Diagnosis not present

## 2018-01-06 DIAGNOSIS — C641 Malignant neoplasm of right kidney, except renal pelvis: Secondary | ICD-10-CM | POA: Diagnosis not present

## 2018-02-03 ENCOUNTER — Ambulatory Visit: Payer: 59 | Admitting: Dietician

## 2018-02-06 ENCOUNTER — Encounter

## 2018-02-06 ENCOUNTER — Encounter: Payer: Self-pay | Admitting: Orthopedic Surgery

## 2018-02-06 ENCOUNTER — Ambulatory Visit: Payer: 59 | Admitting: Orthopedic Surgery

## 2018-02-06 ENCOUNTER — Ambulatory Visit (INDEPENDENT_AMBULATORY_CARE_PROVIDER_SITE_OTHER): Payer: 59

## 2018-02-06 VITALS — BP 146/78 | HR 82 | Ht 62.0 in | Wt 244.0 lb

## 2018-02-06 DIAGNOSIS — M25562 Pain in left knee: Secondary | ICD-10-CM | POA: Diagnosis not present

## 2018-02-06 DIAGNOSIS — Z6841 Body Mass Index (BMI) 40.0 and over, adult: Secondary | ICD-10-CM

## 2018-02-06 DIAGNOSIS — M25561 Pain in right knee: Secondary | ICD-10-CM | POA: Diagnosis not present

## 2018-02-06 DIAGNOSIS — G8929 Other chronic pain: Secondary | ICD-10-CM

## 2018-02-06 NOTE — Progress Notes (Signed)
NEW PATIENT OFFICE VISIT  Chief Complaint  Patient presents with  . Knee Pain    Bilateral knee pain.    57 year old female bilateral knee pain for several years presents for evaluation and treatment.  She has pain in the backs of her legs with intermittent swelling pain appears to be popliteal fossa bilaterally.  She says when it swollen she cannot stand her pain reaches a level of 9 out of 10.  She has not had physical therapy.  She tends cannot take Advil or Aleve due to tachycardia  She says occasionally it hurts to turn her legs or turn her knees.  She also has some pain on the stair climbing and descent  She is on Vicodin but that is for her back pain and does not control her knee pain  She has tried walking to help lose weight but the pain in her legs recur and the swelling recurs.  She also has intermittent lower back pain status post multiple injections for a fall she sustained in 2015 or 16 for which she was told she had arthritis or possible pinched nerve   Review of Systems  Constitutional: Negative for chills, fever and weight loss.  Musculoskeletal: Positive for back pain.  Neurological: Negative for tingling, sensory change, focal weakness and weakness.     Past Medical History:  Diagnosis Date  . Abnormal pap   . Fibroids   . Fibroids, intramural    450-600 gm uterus  . GERD (gastroesophageal reflux disease)   . Hyperlipidemia   . Hypertension   . Menopause 08/27/2014  . Neoplasm of kidney   . PONV (postoperative nausea and vomiting)   . Posterior pain of right hip 07/31/2013  . Renal cancer, right ([place]) 05/2017  . TIA (transient ischemic attack) 2017 or 2018 unsure  . Vaginal Pap smear, abnormal     Past Surgical History:  Procedure Laterality Date  . CESAREAN SECTION    . COLONOSCOPY  02/19/2011   Fields-incomplete exam, otherwise normal   . LAPAROSCOPIC SUPRACERVICAL HYSTERECTOMY  03/02/2011   Procedure: LAPAROSCOPIC SUPRACERVICAL HYSTERECTOMY;   Surgeon: Jonnie Kind, MD;  Location: [facility];  Service: Gynecology;  Laterality: N/A;  . PARTIAL HYSTERECTOMY    . ROBOT ASSISTED LAPAROSCOPIC NEPHRECTOMY Right 05/16/2017   Procedure: XI ROBOTIC ASSISTED LAPAROSCOPIC PARTIAL NEPHRECTOMY;  Surgeon: Raynelle Bring, MD;  Location: [facility];  Service: Urology;  Laterality: Right;  . ROTATOR CUFF REPAIR     left  . TUBAL LIGATION      Family History  Problem Relation Age of Onset  . Heart disease Mother   . Dementia Mother   . Stroke Mother   . Congestive Heart Failure Father   . Cancer Father   . Mental illness Brother   . Early death Sister   . Alcohol abuse Sister   . Stroke Sister   . Mental illness Brother   . Mental illness Brother   . Colon cancer Neg Hx    Social History   Tobacco Use  . Smoking status: Never Smoker  . Smokeless tobacco: Never Used  Substance Use Topics  . Alcohol use: No  . Drug use: No    Allergies  Allergen Reactions  . Adhesive [Tape] Rash    No outpatient medications have been marked as taking for the 02/06/18 encounter (Office Visit) with Carole Civil, MD.    BP (!) 146/78   Pulse 82   Ht 5\' 2"  (1.575 m)   Wt 244 lb (  110.7 kg)   LMP 02/17/2011 Comment: partial   BMI 44.63 kg/m   Physical Exam  Constitutional: She is oriented to person, place, and time. She appears well-developed and well-nourished.  Musculoskeletal:       Right knee: She exhibits no effusion.       Left knee: She exhibits no effusion.  Neurological: She is alert and oriented to person, place, and time.  Psychiatric: She has a normal mood and affect. Judgment normal.  Vitals reviewed.   Right Knee Exam   Muscle Strength  The patient has normal right knee strength.  Tenderness  The patient is experiencing tenderness in the medial joint line.  Range of Motion  Extension: normal  Flexion: normal   Tests  McMurray:  Medial - negative Lateral - negative Varus: negative Valgus: negative Drawer:   Anterior - negative    Posterior - negative  Other  Erythema: absent Scars: absent Sensation: normal Pulse: present Swelling: none Effusion: no effusion present   Left Knee Exam   Muscle Strength  The patient has normal left knee strength.  Tenderness  The patient is experiencing no tenderness.   Range of Motion  Extension: normal  Flexion: normal   Tests  McMurray:  Medial - negative Lateral - negative Varus: negative Valgus: negative Drawer:  Anterior - negative     Posterior - negative  Other  Erythema: absent Scars: absent Sensation: normal Pulse: present Swelling: none Effusion: no effusion present        MEDICAL DECISION SECTION  Xrays were done at [facility]  My independent reading of xrays:  Mild degenerative changes both knees normal alignment  Encounter Diagnoses  Name Primary?  . Chronic pain of both knees Yes  . Severe obesity (BMI >= 40) (HCC)   . Body mass index 40.0-44.9, adult ([place])   . Morbid obesity ([place])     PLAN: (Rx., injectx, surgery, frx, mri/ct) Patient is not a candidate for NSAIDs at this time.  Recommend physical therapy weight loss nutritionist  Return 5 weeks if no improvement consider cortisone injection and MRI  No orders of the defined types were placed in this encounter.  The patient meets the AMA guidelines for Morbid (severe) obesity with a BMI > 40.0 and I have recommended weight loss.   Arther Abbott, MD  02/06/2018 10:20 AM

## 2018-02-09 ENCOUNTER — Ambulatory Visit: Payer: 59 | Admitting: Family Medicine

## 2018-02-09 ENCOUNTER — Encounter: Payer: Self-pay | Admitting: Family Medicine

## 2018-02-09 ENCOUNTER — Ambulatory Visit: Payer: 59 | Admitting: Registered"

## 2018-02-09 VITALS — BP 130/78 | Ht 62.0 in | Wt 247.0 lb

## 2018-02-09 DIAGNOSIS — J310 Chronic rhinitis: Secondary | ICD-10-CM | POA: Diagnosis not present

## 2018-02-09 DIAGNOSIS — Z79899 Other long term (current) drug therapy: Secondary | ICD-10-CM | POA: Diagnosis not present

## 2018-02-09 DIAGNOSIS — E785 Hyperlipidemia, unspecified: Secondary | ICD-10-CM

## 2018-02-09 DIAGNOSIS — I10 Essential (primary) hypertension: Secondary | ICD-10-CM | POA: Diagnosis not present

## 2018-02-09 MED ORDER — ATORVASTATIN CALCIUM 20 MG PO TABS: 20.0000 mg | ORAL_TABLET | Freq: Every day | ORAL | 5 refills | Status: DC

## 2018-02-09 MED ORDER — CHLORZOXAZONE 500 MG PO TABS: ORAL_TABLET | ORAL | 1 refills | Status: DC

## 2018-02-09 MED ORDER — PANTOPRAZOLE SODIUM 40 MG PO TBEC: 40.0000 mg | DELAYED_RELEASE_TABLET | ORAL | 5 refills | Status: DC | PRN

## 2018-02-09 MED ORDER — FLUTICASONE PROPIONATE 50 MCG/ACT NA SUSP: 2.0000 | Freq: Every day | NASAL | 11 refills | Status: DC

## 2018-02-09 MED ORDER — TRIAMTERENE-HCTZ 37.5-25 MG PO TABS: 1.0000 | ORAL_TABLET | Freq: Every day | ORAL | 1 refills | Status: DC

## 2018-02-09 NOTE — Progress Notes (Signed)
   Subjective:    Patient ID: Jo Hale, female    DOB: 1960/08/14, 57 y.o.   MRN: [id number]  HPI  Patient is here today to follow up on chronic health issues.  She eats healthy and tries to exercise, but unable to do much with her back and leg pain. She sees Dr. Aline Brochure for her knees.  Blood pressure medicine and blood pressure levels reviewed today with patient. Compliant with blood pressure medicine. States does not miss a dose. No obvious side effects. Blood pressure generally good when checked elsewhere. Watching salt intake.  Patient continues to take lipid medication regularly. No obvious side effects from it. Generally does not miss a dose. Prior blood work results are reviewed with patient. Patient continues to work on fat intake in diet  Walking the driveway, swelling in the back of the knees, trying to stay active   Pt notes eating the right stuff, maybe a bit too much red neat    Notes ongoing challenges with chronic congestion and drainage.  Breaking through despite antihistamine use.  Frustrated about this.  Chronic cough and congestion    Excise as needed would help           Review of Systems No headache, no major weight loss or weight gain, no chest pain no back pain abdominal pain no change in bowel habits complete ROS otherwise negative     Objective:   Physical Exam Alert and oriented, vitals reviewed and stable, NAD ENT-TM's and ext canals WNL bilat via otoscopic exam Soft palate, tonsils and post pharynx WNL via oropharyngeal exam Neck-symmetric, no masses; thyroid nonpalpable and nontender Pulmonary-no tachypnea or accessory muscle use; Clear without wheezes via auscultation Card--no abnrml murmurs, rhythm reg and rate WNL Carotid pulses symmetric, without bruits        Assessment & Plan:  Impression hypertension.  Good control discussed to maintain same meds  2.  Hyperlipidemia.  Status uncertain.  Appropriate blood work.  Prior  notes reviewed.  To maintain same blood work and also the same dose of Lipitor for now  3.  Chronic rhinitis.  Discussed.  Encouraged to add steroid nasal spray rationale discussed  Further recommendations regarding blood work.  Vaccines discussed.  Diet exercise discussed.  Follow-up in 6 months  Greater than 50% of this 25 minute face to face visit was spent in counseling and discussion and coordination of care regarding the above diagnosis/diagnosies

## 2018-02-09 NOTE — Patient Instructions (Addendum)
Please contact your g I doctor and let them know you are ready for your follow up coonoscopy

## 2018-02-10 DIAGNOSIS — Z79899 Other long term (current) drug therapy: Secondary | ICD-10-CM | POA: Diagnosis not present

## 2018-02-10 DIAGNOSIS — E785 Hyperlipidemia, unspecified: Secondary | ICD-10-CM | POA: Diagnosis not present

## 2018-02-11 LAB — HEPATIC FUNCTION PANEL
ALT: 17 IU/L (ref 0–32)
AST: 15 IU/L (ref 0–40)
Albumin: 4.2 g/dL (ref 3.5–5.5)
Alkaline Phosphatase: 85 IU/L (ref 39–117)
BILIRUBIN TOTAL: 0.3 mg/dL (ref 0.0–1.2)
Bilirubin, Direct: 0.11 mg/dL (ref 0.00–0.40)
Total Protein: 6.9 g/dL (ref 6.0–8.5)

## 2018-02-11 LAB — LIPID PANEL
CHOL/HDL RATIO: 2.4 ratio (ref 0.0–4.4)
CHOLESTEROL TOTAL: 141 mg/dL (ref 100–199)
HDL: 60 mg/dL (ref >39)
LDL CALC: 66 mg/dL (ref 0–99)
TRIGLYCERIDES: 77 mg/dL (ref 0–149)
VLDL CHOLESTEROL CAL: 15 mg/dL (ref 5–40)

## 2018-02-12 ENCOUNTER — Encounter: Payer: Self-pay | Admitting: Family Medicine

## 2018-02-16 ENCOUNTER — Telehealth (HOSPITAL_COMMUNITY): Payer: Self-pay | Admitting: Physical Therapy

## 2018-02-16 ENCOUNTER — Ambulatory Visit (HOSPITAL_COMMUNITY): Payer: 59

## 2018-02-16 NOTE — Telephone Encounter (Signed)
L/M ask pt if she could move  Dec 2 to another date to help with schedule for 52min lymph pt. NF 02/16/18

## 2018-02-16 NOTE — Telephone Encounter (Signed)
L/m ask pt to move to another apptment time on 03/2018 to help with scheduling a 73min lymph pt. waiting on return phone call. NF 02/16/18

## 2018-02-17 DIAGNOSIS — Z0100 Encounter for examination of eyes and vision without abnormal findings: Secondary | ICD-10-CM | POA: Diagnosis not present

## 2018-02-20 ENCOUNTER — Ambulatory Visit (HOSPITAL_COMMUNITY): Payer: 59 | Attending: Orthopedic Surgery

## 2018-02-20 ENCOUNTER — Other Ambulatory Visit: Payer: Self-pay

## 2018-02-20 ENCOUNTER — Encounter (HOSPITAL_COMMUNITY): Payer: Self-pay

## 2018-02-20 DIAGNOSIS — M25562 Pain in left knee: Secondary | ICD-10-CM | POA: Diagnosis not present

## 2018-02-20 DIAGNOSIS — R29898 Other symptoms and signs involving the musculoskeletal system: Secondary | ICD-10-CM | POA: Diagnosis present

## 2018-02-20 DIAGNOSIS — M6281 Muscle weakness (generalized): Secondary | ICD-10-CM | POA: Insufficient documentation

## 2018-02-20 DIAGNOSIS — G8929 Other chronic pain: Secondary | ICD-10-CM | POA: Insufficient documentation

## 2018-02-20 DIAGNOSIS — R2689 Other abnormalities of gait and mobility: Secondary | ICD-10-CM | POA: Insufficient documentation

## 2018-02-20 DIAGNOSIS — M25561 Pain in right knee: Secondary | ICD-10-CM | POA: Insufficient documentation

## 2018-02-20 NOTE — Therapy (Signed)
[name], [name], [address] Phone: [phone]   Fax:  [phone]  Physical Therapy Evaluation  Patient Details  Name: Jo Hale MRN: [id number] Date of Birth: 1960-09-20 Referring Provider (PT): Carole Civil, MD   Encounter Date: 02/20/2018  PT End of Session - 02/20/18 1003    Visit Number  1    Number of Visits  13    Date for PT Re-Evaluation  04/03/18    Authorization Type  [place] (no auth required, 60 visit limit - PT/OT/SLP combined - 13 used at eval)    Authorization Time Period  02/20/2018 - 04/07/2018    Authorization - Visit Number  75    Authorization - Number of Visits  60    PT Start Time  0906    PT Stop Time  0945    PT Time Calculation (min)  39 min    Activity Tolerance  Patient tolerated treatment well    Behavior During Therapy  [facility] for tasks assessed/performed       Past Medical History:  Diagnosis Date  . Abnormal pap   . Fibroids   . Fibroids, intramural    450-600 gm uterus  . GERD (gastroesophageal reflux disease)   . Hyperlipidemia   . Hypertension   . Menopause 08/27/2014  . Neoplasm of kidney   . PONV (postoperative nausea and vomiting)   . Posterior pain of right hip 07/31/2013  . Renal cancer, right ([place]) 05/2017  . TIA (transient ischemic attack) 2017 or 2018 unsure  . Vaginal Pap smear, abnormal     Past Surgical History:  Procedure Laterality Date  . CESAREAN SECTION    . COLONOSCOPY  02/19/2011   Fields-incomplete exam, otherwise normal   . LAPAROSCOPIC SUPRACERVICAL HYSTERECTOMY  03/02/2011   Procedure: LAPAROSCOPIC SUPRACERVICAL HYSTERECTOMY;  Surgeon: Jonnie Kind, MD;  Location: [facility];  Service: Gynecology;  Laterality: N/A;  . PARTIAL HYSTERECTOMY    . ROBOT ASSISTED LAPAROSCOPIC NEPHRECTOMY Right 05/16/2017   Procedure: XI ROBOTIC ASSISTED LAPAROSCOPIC PARTIAL NEPHRECTOMY;  Surgeon: Raynelle Bring, MD;  Location: [facility];   Service: Urology;  Laterality: Right;  . ROTATOR CUFF REPAIR     left  . TUBAL LIGATION      There were no vitals filed for this visit.   Subjective Assessment - 02/20/18 1016    Subjective  Patient reports histpry of bil knee pain for several years. She reports she fell in 2015 and her knee pain seemed to worsen after this event. She denies fallign since then. She reports she is unable to keep up with her grandkids anymore and she would like to be able to as she sees them frequently thorugh the week.     Pertinent History  LBP, TIA, HTN, nephrectomy    Limitations  Sitting;Walking;Standing;House hold activities    How long can you sit comfortably?  knees hurt after several minutes    How long can you stand comfortably?  knees hurt after several minutes    How long can you walk comfortably?  knees hurt after several minutes    Patient Stated Goals  to have less pain with walking and daily activities    Currently in Pain?  Yes    Pain Score  7     Pain Location  Knee    Pain Orientation  Right;Left;Posterior;Lateral    Pain Descriptors / Indicators  Aching   toothache   Pain Type  Chronic pain  Pain Onset  More than a month ago    Pain Frequency  Constant    Aggravating Factors   prolonged walking, sitting, standing    Pain Relieving Factors  rest, medicine       [facility] PT Assessment - 02/20/18 0001      Assessment   Medical Diagnosis  Bil Knee Pain    Referring Provider (PT)  Carole Civil, MD    Onset Date/Surgical Date  --   several years ago   Hand Dominance  Right    Next MD Visit  after therapy completed    Prior Therapy  for knees, LBP, and RC repair      Precautions   Precautions  None      Restrictions   Weight Bearing Restrictions  No      Balance Screen   Has the patient fallen in the past 6 months  No    Has the patient had a decrease in activity level because of a fear of falling?   No    Is the patient reluctant to leave their home because of a  fear of falling?   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 She also enjoys playing with her grandkids (ages 3-7) and would like to be able to keep up with them more.      Cognition   Overall Cognitive Status  Within Functional Limits for tasks assessed      Observation/Other Assessments   Focus on Therapeutic Outcomes (FOTO)   72% limited      ROM / Strength   AROM / PROM / Strength  AROM;Strength;PROM      AROM   AROM Assessment Site  Knee    Right/Left Knee  Right;Left    Right Knee Extension  0    Right Knee Flexion  110    Left Knee Extension  0    Left Knee Flexion  115      PROM   PROM Assessment Site  Knee    Right/Left Knee  Right;Left    Right Knee Extension  0    Right Knee Flexion  112    Left Knee Extension  0    Left Knee Flexion  118      Strength   Strength Assessment Site  Knee;Hip;Ankle    Right Hip Flexion  4/5    Right Hip Extension  4-/5    Right Hip ABduction  4/5    Left Hip Flexion  4/5    Left Hip Extension  4/5    Left Hip ABduction  4/5    Right/Left Knee  Right;Left  Right Knee Flexion  4-/5   painful   Right Knee Extension  4/5   painful   Left Knee Flexion  4/5   painful   Left Knee Extension  4/5   painful   Right Ankle Dorsiflexion  4+/5    Left Ankle Dorsiflexion  4+/5      Flexibility   Soft  Tissue Assessment /Muscle Length  yes    Hamstrings  Rt = 90/140; Lt = 90/150      Palpation   Palpation comment  Tenderness to palpation along latearl and medial hamstring insertions on bil knees. Patient with jumping reaction to palpation of lateral hamstring tendon bil.      Ambulation/Gait   Ambulation/Gait  Yes    Ambulation/Gait Assistance  7: Independent    Ambulation Distance (Feet)  458 Feet   2MWT   Gait Pattern  Step-through pattern;Trendelenburg;Antalgic    Ambulation Surface  Level;Indoor    Gait velocity - backwards  1.15 m/s        Objective measurements completed on examination: See above findings.     PT Education - 02/20/18 1017    Education Details  Educated on overall exam findings and appropriate plan for therapy. Educated on opportunity to participate in aquatic therapy.    Person(s) Educated  Patient    Methods  Explanation    Comprehension  Verbalized understanding       PT Short Term Goals - 02/20/18 1018      PT SHORT TERM GOAL #1   Title  Patient will demonstrate understanding and report regular compliance with HEP in order to improve lower extremity strength, trunk stability, balance, and overall functional mobility.     Time  2    Period  Weeks    Status  New    Target Date  03/06/18      PT SHORT TERM GOAL #2   Title  Patient will improve bil knee ROM by 8 degrees for flexion to demonstrate significant improvement in mobility to improve function with gait and functional activities around home.     Time  3    Period  Weeks    Status  New    Target Date  03/13/18      PT SHORT TERM GOAL #3   Title  Patient will perform SLS for 15 seconds or more on bil LE's to demonstrate improved safety with stair ambulation and with ability to step in and out of bath/tub to reduce fall risk.    Time  3    Period  Weeks    Status  New        PT Long Term Goals - 02/20/18 1018      PT LONG TERM GOAL #1   Title  Patient will demonstrate improved MMT  strength of at least 1/2 grade in all tested musculature in order to improve ease of stair navigation and overall functional mobility.     Time  6    Period  Weeks    Status  New    Target Date  04/03/18      PT LONG TERM GOAL #2   Title  Patient will report no greater than a 3/10 pain in bil knees over the course of a 1 week period indicating better tolerance to daily activities.     Time  6    Period  Weeks    Status  New      PT LONG TERM GOAL #3   Title  Patient will improve bil hamstring flexibility by 10 degrees each to demonstrate improved flexibility to reduce posterior knee pain.    Time  6    Period  Weeks    Status  New      PT LONG TERM GOAL #4   Title  Patient will improve FOTO score by 12% or more to indicate significant improvement in mobility and decreased slef reproted limitation with activities related to knee pain.    Time  6    Period  Weeks    Status  New        Plan - 02/20/18 1004    Clinical Impression Statement  Ms. Halder presents to physical therapy for evaluation of bil knee pain. She reports her pain is greatest with prolonged activities including walking, sitting, and standing. She presents with decreased ROM on bil knees, impaired hamstring flexibility, bil LE weakness, decreased activity tolerance, impaired balance, impaired posture and gait. She has had a positive experience participating in aquatic therapy in [place] for her low back pain and was educated on the option to participate in therapy here at local [facility] pool on Monday afternoons. She will benefit from skilled PT interventions to address impairments and progress towards goals to imprvoe function and QOL.    History and Personal Factors relevant to plan of care:  chronic LBP    Clinical Presentation  Stable    Clinical Presentation due to:  limited ROM, strength, balance, impaired gai, and posture, pain, clinical judgement    Clinical Decision Making  Low    Rehab Potential  Good    PT  Frequency  2x / week    PT Duration  6 weeks    PT Treatment/Interventions  ADLs/Self Care Home Management;Aquatic Therapy;Cryotherapy;Electrical Stimulation;Iontophoresis 4mg /ml Dexamethasone;Moist Heat;Gait training;Stair training;Functional mobility training;Therapeutic activities;Therapeutic exercise;Balance training;Neuromuscular re-education;Patient/family education;Manual techniques;Passive range of motion;Dry needling;Energy conservation    PT Next Visit Plan  Review eval and goals. Follow up on interest in aquatic therapy. Initiate joint mobilization to bil knee joints, AP glide for flexion. Begin hip strengthening and perform soft tissu mobiliization to bil hamstrings.    PT Home Exercise Plan  Provide at next session: hamstring stretch, birdge, clamshell    Consulted and Agree with Plan of Care  Patient       Patient will benefit from skilled therapeutic intervention in order to improve the following deficits and impairments:  Abnormal gait, Decreased balance, Decreased endurance, Decreased mobility, Difficulty walking, Hypomobility, Obesity, Decreased range of motion, Decreased activity tolerance, Decreased strength, Impaired flexibility, Postural dysfunction, Pain  Visit Diagnosis: Chronic pain of left knee  Chronic pain of right knee  Muscle weakness (generalized)  Other abnormalities of gait and mobility     Problem List Patient Active Problem List   Diagnosis Date Noted  . Screening for colorectal cancer 09/01/2017  . Encounter for gynecological examination with Papanicolaou smear of cervix 09/01/2017  . Neoplasm of right kidney 05/16/2017  . Superficial fungus infection of skin 08/31/2016  . Hyperlipidemia LDL goal <100 09/18/2015  . History of stroke 09/18/2015  . CVA (cerebral infarction) 07/02/2015  . Menopause 08/27/2014  . Meralgia paresthetica of left side 04/05/2014  . Venous stasis of both lower extremities 04/05/2014  . Radicular low back pain 11/07/2013   . Leg weakness, bilateral 11/07/2013  . Lumbago 10/09/2013  . Posterior pain of right hip 07/31/2013  . Esophageal reflux 01/25/2013  . Essential hypertension, benign 01/25/2013  . Rectal bleed 01/28/2012  . Constipation 01/28/2012  .  Pain in joint, shoulder region 08/02/2011  . Muscle weakness (generalized) 08/02/2011  . Status post rotator cuff repair 08/02/2011  . FOOT PAIN 12/22/2009  . 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 POPLITEAL CYST, LEFT 06/30/2009  . MEDIAL MENISCUS TEAR, LEFT 06/30/2009    Kipp Brood, PT, DPT Physical Therapist with [facility]  02/20/2018 10:26 AM    [place], [name], [address] Phone: [phone]   Fax:  [phone]  Name: Jo Hale MRN: [id number] Date of Birth: 05/17/1960

## 2018-02-22 ENCOUNTER — Ambulatory Visit (HOSPITAL_COMMUNITY): Payer: 59

## 2018-02-22 DIAGNOSIS — M25561 Pain in right knee: Secondary | ICD-10-CM

## 2018-02-22 DIAGNOSIS — G8929 Other chronic pain: Secondary | ICD-10-CM

## 2018-02-22 DIAGNOSIS — R2689 Other abnormalities of gait and mobility: Secondary | ICD-10-CM

## 2018-02-22 DIAGNOSIS — M25562 Pain in left knee: Principal | ICD-10-CM

## 2018-02-22 DIAGNOSIS — M6281 Muscle weakness (generalized): Secondary | ICD-10-CM

## 2018-02-22 DIAGNOSIS — R29898 Other symptoms and signs involving the musculoskeletal system: Secondary | ICD-10-CM

## 2018-02-22 NOTE — Patient Instructions (Signed)
Hamstring Stretch: Active    Support behind right knee. Starting with knee bent, attempt to straighten knee until a comfortable stretch is felt in back of thigh. Hold _10___ seconds. Repeat _5___ times per set. Do _1___ sets per session both knees. Do _1___ sessions per day.  http://orth.exer.us/159   Copyright  VHI. All rights reserved.   Calf Stretch    Place one leg forward, bent, other leg behind and straight. Lean forward keeping back heel flat. Hold _10___ seconds while counting out loud. Repeat with other leg forward. Repeat _5___ times; both knees. Do _1___ sessions per day.  http://gt2.exer.us/478   Copyright  VHI. All rights reserved.   Flexors, Supine Bridge    Lie supine, feet shoulder-width apart. Lift hips toward ceiling. Hold 2-3___ seconds. Repeat 10-15___ times per session. Do _1__ sessions per day.  Copyright  VHI. All rights reserved.   External Rotation: Hip - Knees Apart (Hook-Lying)    Lie with hips and knees bent, band tied just above knees. Pull knees apart. Hold for 2-3___ seconds.  Repeat 10-15___ times. Do _1 __ times a day.  Copyright  VHI. All rights reserved.

## 2018-02-22 NOTE — Therapy (Signed)
[place], [name], [address] Phone: [phone]   Fax:  [phone]  Physical Therapy Treatment  Patient Details  Name: Jo Hale MRN: [id number] Date of Birth: Aug 04, 1960 Referring Provider (PT): Carole Civil, MD   Encounter Date: 02/22/2018  PT End of Session - 02/22/18 0858    Visit Number  2    Number of Visits  13    Date for PT Re-Evaluation  04/03/18    Authorization Type  [place] (no auth required, 60 visit limit - PT/OT/SLP combined - 13 used at eval)    Authorization Time Period  02/20/2018 - 04/07/2018    Authorization - Visit Number  15    Authorization - Number of Visits  60    PT Start Time  0901    PT Stop Time  0944    PT Time Calculation (min)  43 min    Activity Tolerance  Patient tolerated treatment well    Behavior During Therapy  [facility] for tasks assessed/performed       Past Medical History:  Diagnosis Date  . Abnormal pap   . Fibroids   . Fibroids, intramural    450-600 gm uterus  . GERD (gastroesophageal reflux disease)   . Hyperlipidemia   . Hypertension   . Menopause 08/27/2014  . Neoplasm of kidney   . PONV (postoperative nausea and vomiting)   . Posterior pain of right hip 07/31/2013  . Renal cancer, right ([place]) 05/2017  . TIA (transient ischemic attack) 2017 or 2018 unsure  . Vaginal Pap smear, abnormal     Past Surgical History:  Procedure Laterality Date  . CESAREAN SECTION    . COLONOSCOPY  02/19/2011   Fields-incomplete exam, otherwise normal   . LAPAROSCOPIC SUPRACERVICAL HYSTERECTOMY  03/02/2011   Procedure: LAPAROSCOPIC SUPRACERVICAL HYSTERECTOMY;  Surgeon: Jonnie Kind, MD;  Location: [facility];  Service: Gynecology;  Laterality: N/A;  . PARTIAL HYSTERECTOMY    . ROBOT ASSISTED LAPAROSCOPIC NEPHRECTOMY Right 05/16/2017   Procedure: XI ROBOTIC ASSISTED LAPAROSCOPIC PARTIAL NEPHRECTOMY;  Surgeon: Raynelle Bring, MD;  Location: [facility];   Service: Urology;  Laterality: Right;  . ROTATOR CUFF REPAIR     left  . TUBAL LIGATION      There were no vitals filed for this visit.  Subjective Assessment - 02/22/18 0857    Subjective  6/10 R > L knee pain toothache pain in back of knee; pulling pain.     Pertinent History  LBP, TIA, HTN, nephrectomy    Limitations  Sitting;Walking;Standing;House hold activities    How long can you sit comfortably?  knees hurt after several minutes    How long can you stand comfortably?  knees hurt after several minutes    How long can you walk comfortably?  knees hurt after several minutes    Patient Stated Goals  to have less pain with walking and daily activities    Pain Onset  More than a month ago                       [facility] Adult PT Treatment/Exercise - 02/22/18 0001      Exercises   Exercises  Knee/Hip      Knee/Hip Exercises: Stretches   Active Hamstring Stretch  Both;5 reps;10 seconds    Active Hamstring Stretch Limitations  supine w/ towel    Gastroc Stretch  Both;5 reps;10 seconds      Knee/Hip Exercises: Supine  Bridges  Strengthening;Both;1 set;10 reps    Bridges Limitations  2-3 sec hold    Straight Leg Raises  Strengthening;Both;1 set;10 reps    Other Supine Knee/Hip Exercises  supine clamshell 1x10; 2-3 sec hold      Manual Therapy   Manual Therapy  Joint mobilization;Soft tissue mobilization    Manual therapy comments  completed separate from all other skilled interventions.    Joint Mobilization  patellar mobs; needs AP tib/fib mob next session    Soft tissue mobilization  R > L distal hamstring, proximal gastroc               PT Short Term Goals - 02/20/18 1018      PT SHORT TERM GOAL #1   Title  Patient will demonstrate understanding and report regular compliance with HEP in order to improve lower extremity strength, trunk stability, balance, and overall functional mobility.     Time  2    Period  Weeks    Status  New    Target Date   03/06/18      PT SHORT TERM GOAL #2   Title  Patient will improve bil knee ROM by 8 degrees for flexion to demonstrate significant improvement in mobility to improve function with gait and functional activities around home.     Time  3    Period  Weeks    Status  New    Target Date  03/13/18      PT SHORT TERM GOAL #3   Title  Patient will perform SLS for 15 seconds or more on bil LE's to demonstrate improved safety with stair ambulation and with ability to step in and out of bath/tub to reduce fall risk.    Time  3    Period  Weeks    Status  New        PT Long Term Goals - 02/20/18 1018      PT LONG TERM GOAL #1   Title  Patient will demonstrate improved MMT strength of at least 1/2 grade in all tested musculature in order to improve ease of stair navigation and overall functional mobility.     Time  6    Period  Weeks    Status  New    Target Date  04/03/18      PT LONG TERM GOAL #2   Title  Patient will report no greater than a 3/10 pain in bil knees over the course of a 1 week period indicating better tolerance to daily activities.     Time  6    Period  Weeks    Status  New      PT LONG TERM GOAL #3   Title  Patient will improve bil hamstring flexibility by 10 degrees each to demonstrate improved flexibility to reduce posterior knee pain.    Time  6    Period  Weeks    Status  New      PT LONG TERM GOAL #4   Title  Patient will improve FOTO score by 12% or more to indicate significant improvement in mobility and decreased slef reproted limitation with activities related to knee pain.    Time  6    Period  Weeks    Status  New            Plan - 02/22/18 0858    Clinical Impression Statement  Continued with established plan of care. Reviewed eval and goals. Session focused on initiating strengthening  and stretching program for bilateral LEs, as well as strengthening and stretching HEP initiated today. Added supine hamstring and standing calf stretches, as well  as supine bridge and clamshells with red theraband. Pain reduction through soft tissue mobilizations to bilateral hamstrings and calf muscles with R > L tenderness and restrictions noted in each muscle. Patient reported 5/10 pain at end of session which was a slight decrease from beginning of session. Continue with current plan, progress as able, review HEP and add as able.  Patient is interested in aquatic therapy if she can get a convenient time for her work schedule.     Rehab Potential  Good    PT Frequency  2x / week    PT Duration  6 weeks    PT Treatment/Interventions  ADLs/Self Care Home Management;Aquatic Therapy;Cryotherapy;Electrical Stimulation;Iontophoresis 4mg /ml Dexamethasone;Moist Heat;Gait training;Stair training;Functional mobility training;Therapeutic activities;Therapeutic exercise;Balance training;Neuromuscular re-education;Patient/family education;Manual techniques;Passive range of motion;Dry needling;Energy conservation    PT Next Visit Plan  Follow up on interest in aquatic therapy. Continue joint mobilization to bil knee joints, AP glide for flexion. Continue hip strengthening and perform soft tissue mobiliization to bil hamstrings distal and proximal calves, R > L. Progress to balance when able.     PT Home Exercise Plan  02/22/2018 - hamstring/calf stretches, bridge, clamshell    Consulted and Agree with Plan of Care  Patient       Patient will benefit from skilled therapeutic intervention in order to improve the following deficits and impairments:  Abnormal gait, Decreased balance, Decreased endurance, Decreased mobility, Difficulty walking, Hypomobility, Obesity, Decreased range of motion, Decreased activity tolerance, Decreased strength, Impaired flexibility, Postural dysfunction, Pain  Visit Diagnosis: Chronic pain of left knee  Chronic pain of right knee  Other abnormalities of gait and mobility  Muscle weakness (generalized)  Other symptoms and signs involving  the musculoskeletal system     Problem List Patient Active Problem List   Diagnosis Date Noted  . Screening for colorectal cancer 09/01/2017  . Encounter for gynecological examination with Papanicolaou smear of cervix 09/01/2017  . Neoplasm of right kidney 05/16/2017  . Superficial fungus infection of skin 08/31/2016  . Hyperlipidemia LDL goal <100 09/18/2015  . History of stroke 09/18/2015  . CVA (cerebral infarction) 07/02/2015  . Menopause 08/27/2014  . Meralgia paresthetica of left side 04/05/2014  . Venous stasis of both lower extremities 04/05/2014  . Radicular low back pain 11/07/2013  . Leg weakness, bilateral 11/07/2013  . Lumbago 10/09/2013  . Posterior pain of right hip 07/31/2013  . Esophageal reflux 01/25/2013  . Essential hypertension, benign 01/25/2013  . Rectal bleed 01/28/2012  . Constipation 01/28/2012  . Pain in joint, shoulder region 08/02/2011  . Muscle weakness (generalized) 08/02/2011  . Status post rotator cuff repair 08/02/2011  . FOOT PAIN 12/22/2009  . 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 POPLITEAL CYST, LEFT 06/30/2009  . MEDIAL MENISCUS TEAR, LEFT 06/30/2009    Floria Raveling. Hartnett-Rands, MS, PT Per [place] #[id number] 02/22/2018, 10:07 AM  [place] [address] [place], [name], [address] Phone: [phone]   Fax:  [phone]  Name: Jo Hale MRN: [id number] Date of Birth: 03/26/1961

## 2018-02-23 ENCOUNTER — Ambulatory Visit (HOSPITAL_COMMUNITY): Payer: 59 | Admitting: Physical Therapy

## 2018-02-23 DIAGNOSIS — R2689 Other abnormalities of gait and mobility: Secondary | ICD-10-CM

## 2018-02-23 DIAGNOSIS — M6281 Muscle weakness (generalized): Secondary | ICD-10-CM

## 2018-02-23 DIAGNOSIS — M25562 Pain in left knee: Secondary | ICD-10-CM | POA: Diagnosis not present

## 2018-02-23 DIAGNOSIS — G8929 Other chronic pain: Secondary | ICD-10-CM

## 2018-02-23 DIAGNOSIS — M25561 Pain in right knee: Secondary | ICD-10-CM

## 2018-02-23 NOTE — Therapy (Signed)
[place], [name], [address] Phone: [phone]   Fax:  [phone]  Physical Therapy Treatment  Patient Details  Name: Jo Hale MRN: [id number] Date of Birth: 1960-06-22 Referring Provider (PT): Carole Civil, MD   Encounter Date: 02/23/2018  PT End of Session - 02/23/18 1028    Visit Number  3    Number of Visits  13    Date for PT Re-Evaluation  04/03/18    Authorization Type  [employer] (no auth required, 60 visit limit - PT/OT/SLP combined - 13 used at eval)    Authorization Time Period  02/20/2018 - 04/07/2018    Authorization - Visit Number  22    Authorization - Number of Visits  60    PT Start Time  0906    PT Stop Time  0948    PT Time Calculation (min)  42 min    Activity Tolerance  Patient tolerated treatment well    Behavior During Therapy  [facility] for tasks assessed/performed       Past Medical History:  Diagnosis Date  . Abnormal pap   . Fibroids   . Fibroids, intramural    450-600 gm uterus  . GERD (gastroesophageal reflux disease)   . Hyperlipidemia   . Hypertension   . Menopause 08/27/2014  . Neoplasm of kidney   . PONV (postoperative nausea and vomiting)   . Posterior pain of right hip 07/31/2013  . Renal cancer, right ([place]) 05/2017  . TIA (transient ischemic attack) 2017 or 2018 unsure  . Vaginal Pap smear, abnormal     Past Surgical History:  Procedure Laterality Date  . CESAREAN SECTION    . COLONOSCOPY  02/19/2011   Fields-incomplete exam, otherwise normal   . LAPAROSCOPIC SUPRACERVICAL HYSTERECTOMY  03/02/2011   Procedure: LAPAROSCOPIC SUPRACERVICAL HYSTERECTOMY;  Surgeon: Jonnie Kind, MD;  Location: [facility];  Service: Gynecology;  Laterality: N/A;  . PARTIAL HYSTERECTOMY    . ROBOT ASSISTED LAPAROSCOPIC NEPHRECTOMY Right 05/16/2017   Procedure: XI ROBOTIC ASSISTED LAPAROSCOPIC PARTIAL NEPHRECTOMY;  Surgeon: Raynelle Bring, MD;  Location: [facility];   Service: Urology;  Laterality: Right;  . ROTATOR CUFF REPAIR     left  . TUBAL LIGATION      There were no vitals filed for this visit.  Subjective Assessment - 02/23/18 0923    Subjective  Pt sttates she still has posteerior knee pain bilaterally, Rt>Lt.  States the more shes on them or if she sits too long begins to hurt.                         [place] Adult PT Treatment/Exercise - 02/23/18 0001      Exercises   Exercises  Knee/Hip      Knee/Hip Exercises: Stretches   Active Hamstring Stretch  Both;5 reps;10 seconds    Active Hamstring Stretch Limitations  supine w/ towel      Knee/Hip Exercises: Supine   Bridges  Strengthening;Both;1 set;10 reps    Bridges Limitations  2-3 sec hold    Straight Leg Raises  Strengthening;Both;1 set;10 reps    Other Supine Knee/Hip Exercises  supine clamshell 1x10; 2-3 sec hold      Knee/Hip Exercises: Sidelying   Hip ABduction  Both;15 reps    Clams  both 15 reps 5" holds      Knee/Hip Exercises: Prone   Hip Extension  Both;15 reps      Manual  Therapy   Manual Therapy  Soft tissue mobilization    Manual therapy comments  completed separate from all other skilled interventions.    Soft tissue mobilization  Rt hamstring, posterior knee               PT Short Term Goals - 02/20/18 1018      PT SHORT TERM GOAL #1   Title  Patient will demonstrate understanding and report regular compliance with HEP in order to improve lower extremity strength, trunk stability, balance, and overall functional mobility.     Time  2    Period  Weeks    Status  New    Target Date  03/06/18      PT SHORT TERM GOAL #2   Title  Patient will improve bil knee ROM by 8 degrees for flexion to demonstrate significant improvement in mobility to improve function with gait and functional activities around home.     Time  3    Period  Weeks    Status  New    Target Date  03/13/18      PT SHORT TERM GOAL #3   Title  Patient will perform  SLS for 15 seconds or more on bil LE's to demonstrate improved safety with stair ambulation and with ability to step in and out of bath/tub to reduce fall risk.    Time  3    Period  Weeks    Status  New        PT Long Term Goals - 02/20/18 1018      PT LONG TERM GOAL #1   Title  Patient will demonstrate improved MMT strength of at least 1/2 grade in all tested musculature in order to improve ease of stair navigation and overall functional mobility.     Time  6    Period  Weeks    Status  New    Target Date  04/03/18      PT LONG TERM GOAL #2   Title  Patient will report no greater than a 3/10 pain in bil knees over the course of a 1 week period indicating better tolerance to daily activities.     Time  6    Period  Weeks    Status  New      PT LONG TERM GOAL #3   Title  Patient will improve bil hamstring flexibility by 10 degrees each to demonstrate improved flexibility to reduce posterior knee pain.    Time  6    Period  Weeks    Status  New      PT LONG TERM GOAL #4   Title  Patient will improve FOTO score by 12% or more to indicate significant improvement in mobility and decreased slef reproted limitation with activities related to knee pain.    Time  6    Period  Weeks    Status  New            Plan - 02/23/18 1029    Clinical Impression Statement  contiued with established therex.  continues to require cues for general form and stabilization.  Began sidelying and prone hip  strengthening exercises bilaterally.  Noted weakness in glutes with tendency to utilize hip flexors/paraspinals. soft tissue/edema massage completed to posterior Rt LE to help reduce symptoms.  Noted tigihtness in hamstring and gastroc mm.  Pt without change in pain at end of session.  will follow up with Apolonio Schneiders for [place], pt can only do  1:00 sessions.      Rehab Potential  Good    PT Frequency  2x / week    PT Duration  6 weeks    PT Treatment/Interventions  ADLs/Self Care Home  Management;Aquatic Therapy;Cryotherapy;Electrical Stimulation;Iontophoresis 4mg /ml Dexamethasone;Moist Heat;Gait training;Stair training;Functional mobility training;Therapeutic activities;Therapeutic exercise;Balance training;Neuromuscular re-education;Patient/family education;Manual techniques;Passive range of motion;Dry needling;Energy conservation    PT Next Visit Plan  Follow up on availability of aquatic therapy. Continue joint mobilization to bil knee joints, AP glide for flexion. Continue hip strengthening and perform soft tissue mobiliization to bil hamstrings distal and proximal calves, R > L. Progress to balance when able.     PT Home Exercise Plan  02/22/2018 - hamstring/calf stretches, bridge, clamshell    Consulted and Agree with Plan of Care  Patient       Patient will benefit from skilled therapeutic intervention in order to improve the following deficits and impairments:  Abnormal gait, Decreased balance, Decreased endurance, Decreased mobility, Difficulty walking, Hypomobility, Obesity, Decreased range of motion, Decreased activity tolerance, Decreased strength, Impaired flexibility, Postural dysfunction, Pain  Visit Diagnosis: Chronic pain of left knee  Chronic pain of right knee  Other abnormalities of gait and mobility  Muscle weakness (generalized)     Problem List Patient Active Problem List   Diagnosis Date Noted  . Screening for colorectal cancer 09/01/2017  . Encounter for gynecological examination with Papanicolaou smear of cervix 09/01/2017  . Neoplasm of right kidney 05/16/2017  . Superficial fungus infection of skin 08/31/2016  . Hyperlipidemia LDL goal <100 09/18/2015  . History of stroke 09/18/2015  . CVA (cerebral infarction) 07/02/2015  . Menopause 08/27/2014  . Meralgia paresthetica of left side 04/05/2014  . Venous stasis of both lower extremities 04/05/2014  . Radicular low back pain 11/07/2013  . Leg weakness, bilateral 11/07/2013  . Lumbago  10/09/2013  . Posterior pain of right hip 07/31/2013  . Esophageal reflux 01/25/2013  . Essential hypertension, benign 01/25/2013  . Rectal bleed 01/28/2012  . Constipation 01/28/2012  . Pain in joint, shoulder region 08/02/2011  . Muscle weakness (generalized) 08/02/2011  . Status post rotator cuff repair 08/02/2011  . FOOT PAIN 12/22/2009  . 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 POPLITEAL CYST, LEFT 06/30/2009  . MEDIAL MENISCUS TEAR, LEFT 06/30/2009   Teena Irani, PTA/CLT [phone]  Teena Irani 02/23/2018, 10:44 AM  [facility] [address] [place], [name], [address] Phone: [phone]   Fax:  [phone]  Name: Jo Hale MRN: [id number] Date of Birth: 21-Aug-1960

## 2018-02-24 ENCOUNTER — Telehealth (HOSPITAL_COMMUNITY): Payer: Self-pay

## 2018-02-24 ENCOUNTER — Encounter (HOSPITAL_COMMUNITY): Payer: 59 | Admitting: Physical Therapy

## 2018-02-24 NOTE — Telephone Encounter (Signed)
l/m for pt that this apptment 03/06/18 had been changed to AQ per RQ. And that every Monday available thereafter had been changed to AQ. Pt will be seen 2xwk each week. Print new schedule when pt come in to her next apptment,. NF 02/24/18

## 2018-02-24 NOTE — Telephone Encounter (Signed)
pt needs to pick which one she wants to keep since she has AQ on Monday this week.  Pt has Wed and Tx 12/11 or 12, 2019 NF 02/24/18

## 2018-02-27 ENCOUNTER — Encounter (HOSPITAL_COMMUNITY): Payer: Self-pay

## 2018-02-27 ENCOUNTER — Ambulatory Visit (HOSPITAL_COMMUNITY): Payer: 59

## 2018-02-27 ENCOUNTER — Other Ambulatory Visit: Payer: Self-pay

## 2018-02-27 DIAGNOSIS — G8929 Other chronic pain: Secondary | ICD-10-CM | POA: Diagnosis not present

## 2018-02-27 DIAGNOSIS — R2689 Other abnormalities of gait and mobility: Secondary | ICD-10-CM

## 2018-02-27 DIAGNOSIS — M25561 Pain in right knee: Secondary | ICD-10-CM

## 2018-02-27 DIAGNOSIS — M25562 Pain in left knee: Secondary | ICD-10-CM | POA: Diagnosis not present

## 2018-02-27 DIAGNOSIS — M6281 Muscle weakness (generalized): Secondary | ICD-10-CM

## 2018-02-27 DIAGNOSIS — R29898 Other symptoms and signs involving the musculoskeletal system: Secondary | ICD-10-CM

## 2018-02-28 NOTE — Therapy (Signed)
[place], [name], [address] Phone: [phone]   Fax:  [phone]  Physical Therapy Aquatic Treatment  Patient Details  Name: Jo Hale MRN: [id number] Date of Birth: 1960-08-25 Referring Provider (PT): Carole Civil, MD   Encounter Date: 02/27/2018  PT End of Session - 02/27/18 1655    Visit Number  4    Number of Visits  13    Date for PT Re-Evaluation  04/03/18    Authorization Type  [employer] (no auth required, 60 visit limit - PT/OT/SLP combined - 13 used at eval)    Authorization Time Period  02/20/2018 - 04/07/2018    Authorization - Visit Number  61    Authorization - Number of Visits  60    PT Start Time  0488    PT Stop Time  1336    PT Time Calculation (min)  39 min    Activity Tolerance  Patient tolerated treatment well    Behavior During Therapy  [facility] for tasks assessed/performed       Past Medical History:  Diagnosis Date  . Abnormal pap   . Fibroids   . Fibroids, intramural    450-600 gm uterus  . GERD (gastroesophageal reflux disease)   . Hyperlipidemia   . Hypertension   . Menopause 08/27/2014  . Neoplasm of kidney   . PONV (postoperative nausea and vomiting)   . Posterior pain of right hip 07/31/2013  . Renal cancer, right ([place]) 05/2017  . TIA (transient ischemic attack) 2017 or 2018 unsure  . Vaginal Pap smear, abnormal     Past Surgical History:  Procedure Laterality Date  . CESAREAN SECTION    . COLONOSCOPY  02/19/2011   Fields-incomplete exam, otherwise normal   . LAPAROSCOPIC SUPRACERVICAL HYSTERECTOMY  03/02/2011   Procedure: LAPAROSCOPIC SUPRACERVICAL HYSTERECTOMY;  Surgeon: Jonnie Kind, MD;  Location: [facility];  Service: Gynecology;  Laterality: N/A;  . PARTIAL HYSTERECTOMY    . ROBOT ASSISTED LAPAROSCOPIC NEPHRECTOMY Right 05/16/2017   Procedure: XI ROBOTIC ASSISTED LAPAROSCOPIC PARTIAL NEPHRECTOMY;  Surgeon: Raynelle Bring, MD;  Location: [facility];   Service: Urology;  Laterality: Right;  . ROTATOR CUFF REPAIR     left  . TUBAL LIGATION      There were no vitals filed for this visit.  Subjective Assessment - 02/27/18 1650    Subjective  Patient arrives to [facility] pool and reports she is doing well. She is ready to start pool therapy and reports she enjoys the water.     Pertinent History  LBP, TIA, HTN, nephrectomy    Limitations  Sitting;Walking;Standing;House hold activities    Currently in Pain?  Yes    Pain Score  4     Pain Location  Knee    Pain Orientation  Right;Left    Pain Type  Chronic pain    Pain Onset  More than a month ago    Pain Frequency  Constant    Aggravating Factors   prolonged walking, sitting    Pain Relieving Factors  rest and medicine       Adult Aquatic Therapy - 02/28/18 1543      Aquatic Therapy Subjective   Subjective   Patient arrives to [facility] pool and reports she is doing well. She is ready to start pool therapy and reports she enjoys the water.        Treatment   Exercises  1x 20 reps squat with heel raise with UE  support at wall. 1x 20 reps bil LE heel raise, 3 sec holds, bil UE support. 1x 15 reps bil LE, forward lunge with single UE support. 1x 15 reps bil LE, pendulum with noodle and knee bent. 1x 15 reps combined hip (flex/abd to extension to adduction) bil LE with 5lbs ankle weight. 1x 15 reps noodle press bil LE with UE support. 1x 15 reps LAQ with bil LE. Quad stretch with noodle, 2x 30 seconds bil LE.     Specific Exercises  Hip/Low Back    Hip/Low Back  3x 1 minute kicking at wall for LE endurance while maintaining elevated hips       PT Education - 02/27/18 1549    Education Details  Educated on aquatic exercise and safety in aquatic environment.    Person(s) Educated  Patient    Methods  Explanation;Demonstration;Verbal cues    Comprehension  Verbalized understanding;Returned demonstration       PT Short Term Goals - 02/20/18 1018      PT SHORT TERM GOAL #1   Title  Patient  will demonstrate understanding and report regular compliance with HEP in order to improve lower extremity strength, trunk stability, balance, and overall functional mobility.     Time  2    Period  Weeks    Status  New    Target Date  03/06/18      PT SHORT TERM GOAL #2   Title  Patient will improve bil knee ROM by 8 degrees for flexion to demonstrate significant improvement in mobility to improve function with gait and functional activities around home.     Time  3    Period  Weeks    Status  New    Target Date  03/13/18      PT SHORT TERM GOAL #3   Title  Patient will perform SLS for 15 seconds or more on bil LE's to demonstrate improved safety with stair ambulation and with ability to step in and out of bath/tub to reduce fall risk.    Time  3    Period  Weeks    Status  New        PT Long Term Goals - 02/20/18 1018      PT LONG TERM GOAL #1   Title  Patient will demonstrate improved MMT strength of at least 1/2 grade in all tested musculature in order to improve ease of stair navigation and overall functional mobility.     Time  6    Period  Weeks    Status  New    Target Date  04/03/18      PT LONG TERM GOAL #2   Title  Patient will report no greater than a 3/10 pain in bil knees over the course of a 1 week period indicating better tolerance to daily activities.     Time  6    Period  Weeks    Status  New      PT LONG TERM GOAL #3   Title  Patient will improve bil hamstring flexibility by 10 degrees each to demonstrate improved flexibility to reduce posterior knee pain.    Time  6    Period  Weeks    Status  New      PT LONG TERM GOAL #4   Title  Patient will improve FOTO score by 12% or more to indicate significant improvement in mobility and decreased slef reproted limitation with activities related to knee pain.  Time  6    Period  Weeks    Status  New        Plan - 02/27/18 1550    Clinical Impression Statement  Initiated aquatic therapy for patient  today to address bil knee pain. Focus was on mobility and functional LE strengthening. Patient performed exercises in waist to chest deep water to offer ~ 50% decreased body weight. She reported a pulling sensation with lunges this date but denied pain through the session. Minimal cues were required after demonstration to achieve proper form. She denied pain at EOS and will continue to benefit from skilled PT intervention in aquatic and land based settings to address impairments and improve QOL through mobility.    Rehab Potential  Good    PT Frequency  2x / week    PT Duration  6 weeks    PT Treatment/Interventions  ADLs/Self Care Home Management;Aquatic Therapy;Cryotherapy;Electrical Stimulation;Iontophoresis 4mg /ml Dexamethasone;Moist Heat;Gait training;Stair training;Functional mobility training;Therapeutic activities;Therapeutic exercise;Balance training;Neuromuscular re-education;Patient/family education;Manual techniques;Passive range of motion;Dry needling;Energy conservation    PT Next Visit Plan  Follow up with how she felt after aquatic session. Continue joint mobilization to bil knee joints, AP glide for flexion. Continue hip strengthening and perform soft tissue mobilization to bil hamstrings distal and proximal calves, R > L. Progress to balance and strengthening as able.    PT Home Exercise Plan  02/22/2018 - hamstring/calf stretches, bridge, clamshell    Consulted and Agree with Plan of Care  Patient       Patient will benefit from skilled therapeutic intervention in order to improve the following deficits and impairments:  Abnormal gait, Decreased balance, Decreased endurance, Decreased mobility, Difficulty walking, Hypomobility, Obesity, Decreased range of motion, Decreased activity tolerance, Decreased strength, Impaired flexibility, Postural dysfunction, Pain  Visit Diagnosis: Chronic pain of left knee  Chronic pain of right knee  Other abnormalities of gait and  mobility  Muscle weakness (generalized)  Other symptoms and signs involving the musculoskeletal system     Problem List Patient Active Problem List   Diagnosis Date Noted  . Screening for colorectal cancer 09/01/2017  . Encounter for gynecological examination with Papanicolaou smear of cervix 09/01/2017  . Neoplasm of right kidney 05/16/2017  . Superficial fungus infection of skin 08/31/2016  . Hyperlipidemia LDL goal <100 09/18/2015  . History of stroke 09/18/2015  . CVA (cerebral infarction) 07/02/2015  . Menopause 08/27/2014  . Meralgia paresthetica of left side 04/05/2014  . Venous stasis of both lower extremities 04/05/2014  . Radicular low back pain 11/07/2013  . Leg weakness, bilateral 11/07/2013  . Lumbago 10/09/2013  . Posterior pain of right hip 07/31/2013  . Esophageal reflux 01/25/2013  . Essential hypertension, benign 01/25/2013  . Rectal bleed 01/28/2012  . Constipation 01/28/2012  . Pain in joint, shoulder region 08/02/2011  . Muscle weakness (generalized) 08/02/2011  . Status post rotator cuff repair 08/02/2011  . FOOT PAIN 12/22/2009  . 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 POPLITEAL CYST, LEFT 06/30/2009  . MEDIAL MENISCUS TEAR, LEFT 06/30/2009    Kipp Brood, PT, DPT Physical Therapist with [facility]  02/28/2018 3:56 PM    [facility] [address] [place], [name], [address] Phone: [phone]   Fax:  [phone]  Name: Jo Hale MRN: [id number] Date of Birth: [birth year]

## 2018-03-01 ENCOUNTER — Ambulatory Visit (INDEPENDENT_AMBULATORY_CARE_PROVIDER_SITE_OTHER): Payer: Self-pay

## 2018-03-01 ENCOUNTER — Ambulatory Visit (HOSPITAL_COMMUNITY): Payer: 59 | Admitting: Physical Therapy

## 2018-03-01 DIAGNOSIS — Z1211 Encounter for screening for malignant neoplasm of colon: Secondary | ICD-10-CM

## 2018-03-01 DIAGNOSIS — G8929 Other chronic pain: Secondary | ICD-10-CM

## 2018-03-01 DIAGNOSIS — M6281 Muscle weakness (generalized): Secondary | ICD-10-CM

## 2018-03-01 DIAGNOSIS — R29898 Other symptoms and signs involving the musculoskeletal system: Secondary | ICD-10-CM

## 2018-03-01 DIAGNOSIS — M25561 Pain in right knee: Secondary | ICD-10-CM

## 2018-03-01 DIAGNOSIS — M25562 Pain in left knee: Principal | ICD-10-CM

## 2018-03-01 DIAGNOSIS — R2689 Other abnormalities of gait and mobility: Secondary | ICD-10-CM

## 2018-03-01 NOTE — Progress Notes (Addendum)
Gastroenterology Pre-Procedure Review  Request Date:03/01/18 Requesting Physician: 5 year recall last tcs [facility] 02/19/11- poor prep- ate steak and potatoes prior to last tcs- pt was due in 2017- letter was mailed to her but we received no response.    PATIENT REVIEW QUESTIONS: The patient responded to the following health history questions as indicated:   pts husband is loosing his insurance and pt would like tcs prior to 03/30/18. We do not have anything available at time. Advised pt that I would contact her if I have any cancellations. rx and instructions to be mailed or faxed to the pharmacy. We went over how to do the prep and pt is aware that she cannot eat any solid foods the day before her procedure.   1. Diabetes Melitis: no 2. Joint replacements in the past 12 months: no 3. Major health problems in the past 3 months: no 4. Has an artificial valve or MVP: no 5. Has a defibrillator: no 6. Has been advised in past to take antibiotics in advance of a procedure like teeth cleaning: no 7. Family history of colon cancer: no  8. Alcohol Use: no 9. History of sleep apnea: no  10. History of coronary artery or other vascular stents placed within the last 12 months: no 11. History of any prior anesthesia complications: no    MEDICATIONS & ALLERGIES:    Patient reports the following regarding taking any blood thinners:   Plavix? no Aspirin? no Coumadin? no Brilinta? no Xarelto? no Eliquis? no Pradaxa? no Savaysa? no Effient? no  Patient confirms/reports the following medications:  Current Outpatient Medications  Medication Sig Dispense Refill  . atorvastatin (LIPITOR) 20 MG tablet Take 1 tablet (20 mg total) by mouth daily. 30 tablet 5  . diphenhydrAMINE (BENADRYL) 25 mg capsule Take 25 mg by mouth every 6 (six) hours as needed (for allergies.).    Marland Kitchen fluticasone (FLONASE) 50 MCG/ACT nasal spray Place 2 sprays into both nostrils daily. 16 g 11  . HYDROcodone-acetaminophen (NORCO) 5-325  MG tablet Take 1-2 tablets by mouth every 6 (six) hours as needed for moderate pain or severe pain. 30 tablet 0  . Multiple Minerals-Vitamins (CALCIUM & VIT D3 BONE HEALTH) LIQD Take by mouth daily.    . Omega-3 1000 MG CAPS Take by mouth daily.    . pantoprazole (PROTONIX) 40 MG tablet Take 1 tablet (40 mg total) by mouth as needed. 30 tablet 5  . sodium chloride (OCEAN) 0.65 % SOLN nasal spray Place 1 spray into both nostrils 4 (four) times daily as needed for congestion.    . triamterene-hydrochlorothiazide (MAXZIDE-25) 37.5-25 MG tablet Take 1 tablet by mouth daily. 30 tablet 1   No current facility-administered medications for this visit.     Patient confirms/reports the following allergies:  Allergies  Allergen Reactions  . Adhesive [Tape] Rash    No orders of the defined types were placed in this encounter.   AUTHORIZATION INFORMATION Primary Insurance: [facility],  [place] #: [id number] Pre-Cert / Josem Kaufmann required: yes Pre-Cert / Auth #: [id number]  SCHEDULE INFORMATION: Procedure has been scheduled as follows:  Date:03/20/18 , Time: 9:30 Location: [facility] [address]  This Gastroenterology Pre-Precedure Review Form is being routed to the following provider(s): Roseanne Kaufman NP

## 2018-03-01 NOTE — Therapy (Signed)
[place], [name], [address] Phone: [phone]   Fax:  [phone]  Physical Therapy Treatment  Patient Details  Name: Jo Hale MRN: [id number] Date of Birth: 10/30/1960 Referring Provider (PT): Carole Civil, MD   Encounter Date: 03/01/2018  PT End of Session - 03/01/18 0950    Visit Number  5    Number of Visits  13    Date for PT Re-Evaluation  04/03/18    Authorization Type  [place] (no auth required, 60 visit limit - PT/OT/SLP combined - 13 used at eval)    Authorization Time Period  02/20/2018 - 04/07/2018    Authorization - Visit Number  54    Authorization - Number of Visits  60    PT Start Time  0906    PT Stop Time  0950    PT Time Calculation (min)  44 min    Activity Tolerance  Patient tolerated treatment well    Behavior During Therapy  [facility] for tasks assessed/performed       Past Medical History:  Diagnosis Date  . Abnormal pap   . Fibroids   . Fibroids, intramural    450-600 gm uterus  . GERD (gastroesophageal reflux disease)   . Hyperlipidemia   . Hypertension   . Menopause 08/27/2014  . Neoplasm of kidney   . PONV (postoperative nausea and vomiting)   . Posterior pain of right hip 07/31/2013  . Renal cancer, right ([place]) 05/2017  . TIA (transient ischemic attack) 2017 or 2018 unsure  . Vaginal Pap smear, abnormal     Past Surgical History:  Procedure Laterality Date  . CESAREAN SECTION    . COLONOSCOPY  02/19/2011   Fields-incomplete exam, otherwise normal   . LAPAROSCOPIC SUPRACERVICAL HYSTERECTOMY  03/02/2011   Procedure: LAPAROSCOPIC SUPRACERVICAL HYSTERECTOMY;  Surgeon: Jonnie Kind, MD;  Location: [facility];  Service: Gynecology;  Laterality: N/A;  . PARTIAL HYSTERECTOMY    . ROBOT ASSISTED LAPAROSCOPIC NEPHRECTOMY Right 05/16/2017   Procedure: XI ROBOTIC ASSISTED LAPAROSCOPIC PARTIAL NEPHRECTOMY;  Surgeon: Raynelle Bring, MD;  Location: [facility];   Service: Urology;  Laterality: Right;  . ROTATOR CUFF REPAIR     left  . TUBAL LIGATION      There were no vitals filed for this visit.  Subjective Assessment - 03/01/18 0909    Subjective  Pt states she really enjoyed the aquatic therapy on Monday.  States she's currently having pain behind both of her knees at 7/10. Rt>Lt    Currently in Pain?  Yes    Pain Score  7     Pain Location  Knee    Pain Orientation  Right;Left;Posterior    Pain Descriptors / Indicators  Aching    Pain Type  Chronic pain                       [facility] Adult PT Treatment/Exercise - 03/01/18 0001      Exercises   Exercises  Knee/Hip      Knee/Hip Exercises: Stretches   Active Hamstring Stretch  Both;5 reps;10 seconds    Active Hamstring Stretch Limitations  supine w/ towel      Knee/Hip Exercises: Supine   Bridges  Strengthening;Both;1 set;10 reps    Bridges Limitations  5 sec holds    Bridges with [employer]  Both;10 reps;Limitations   on 12" box with hamstring activation   Straight Leg Raises  Strengthening;Both;1 set;10 reps  Other Supine Knee/Hip Exercises  supine clamshell 1x10; 2-3 sec hold      Knee/Hip Exercises: Sidelying   Hip ABduction  Both;15 reps    Clams  both 15 reps 5" holds      Knee/Hip Exercises: Prone   Hip Extension  Both;15 reps    Other Prone Exercises  prone heel squeeze 10X5"      Manual Therapy   Manual Therapy  Soft tissue mobilization    Manual therapy comments  completed separate from all other skilled interventions.    Soft tissue mobilization  bil hamstring, posterior knee, Rt>Lt               PT Short Term Goals - 02/20/18 1018      PT SHORT TERM GOAL #1   Title  Patient will demonstrate understanding and report regular compliance with HEP in order to improve lower extremity strength, trunk stability, balance, and overall functional mobility.     Time  2    Period  Weeks    Status  New    Target Date  03/06/18      PT SHORT  TERM GOAL #2   Title  Patient will improve bil knee ROM by 8 degrees for flexion to demonstrate significant improvement in mobility to improve function with gait and functional activities around home.     Time  3    Period  Weeks    Status  New    Target Date  03/13/18      PT SHORT TERM GOAL #3   Title  Patient will perform SLS for 15 seconds or more on bil LE's to demonstrate improved safety with stair ambulation and with ability to step in and out of bath/tub to reduce fall risk.    Time  3    Period  Weeks    Status  New        PT Long Term Goals - 02/20/18 1018      PT LONG TERM GOAL #1   Title  Patient will demonstrate improved MMT strength of at least 1/2 grade in all tested musculature in order to improve ease of stair navigation and overall functional mobility.     Time  6    Period  Weeks    Status  New    Target Date  04/03/18      PT LONG TERM GOAL #2   Title  Patient will report no greater than a 3/10 pain in bil knees over the course of a 1 week period indicating better tolerance to daily activities.     Time  6    Period  Weeks    Status  New      PT LONG TERM GOAL #3   Title  Patient will improve bil hamstring flexibility by 10 degrees each to demonstrate improved flexibility to reduce posterior knee pain.    Time  6    Period  Weeks    Status  New      PT LONG TERM GOAL #4   Title  Patient will improve FOTO score by 12% or more to indicate significant improvement in mobility and decreased slef reproted limitation with activities related to knee pain.    Time  6    Period  Weeks    Status  New            Plan - 03/01/18 1145    Clinical Impression Statement  continued with focus on improving LE and core  strength in attempts to decrease bil knee pain.  Pt able to complete all exercises without c/o pain, noted difficulty stabilizing with SLR.  completed manual to bilateral posterior knees with more tightness/senstivities in Rt LE.  Pt reported  reduction in overall tightness at end of session, no change in pain.     Rehab Potential  Good    PT Frequency  2x / week    PT Duration  6 weeks    PT Treatment/Interventions  ADLs/Self Care Home Management;Aquatic Therapy;Cryotherapy;Electrical Stimulation;Iontophoresis 4mg /ml Dexamethasone;Moist Heat;Gait training;Stair training;Functional mobility training;Therapeutic activities;Therapeutic exercise;Balance training;Neuromuscular re-education;Patient/family education;Manual techniques;Passive range of motion;Dry needling;Energy conservation    PT Next Visit Plan   Continue joint mobilization to bil knee joints, AP glide for flexion. Continue hip strengthening and perform soft tissue mobilization to bil hamstrings distal and proximal calves, R > L. Progress to balance and strengthening as able.    PT Home Exercise Plan  02/22/2018 - hamstring/calf stretches, bridge, clamshell    Consulted and Agree with Plan of Care  Patient       Patient will benefit from skilled therapeutic intervention in order to improve the following deficits and impairments:  Abnormal gait, Decreased balance, Decreased endurance, Decreased mobility, Difficulty walking, Hypomobility, Obesity, Decreased range of motion, Decreased activity tolerance, Decreased strength, Impaired flexibility, Postural dysfunction, Pain  Visit Diagnosis: Chronic pain of left knee  Chronic pain of right knee  Other abnormalities of gait and mobility  Muscle weakness (generalized)  Other symptoms and signs involving the musculoskeletal system     Problem List Patient Active Problem List   Diagnosis Date Noted  . Screening for colorectal cancer 09/01/2017  . Encounter for gynecological examination with Papanicolaou smear of cervix 09/01/2017  . Neoplasm of right kidney 05/16/2017  . Superficial fungus infection of skin 08/31/2016  . Hyperlipidemia LDL goal <100 09/18/2015  . History of stroke 09/18/2015  . CVA (cerebral  infarction) 07/02/2015  . Menopause 08/27/2014  . Meralgia paresthetica of left side 04/05/2014  . Venous stasis of both lower extremities 04/05/2014  . Radicular low back pain 11/07/2013  . Leg weakness, bilateral 11/07/2013  . Lumbago 10/09/2013  . Posterior pain of right hip 07/31/2013  . Esophageal reflux 01/25/2013  . Essential hypertension, benign 01/25/2013  . Rectal bleed 01/28/2012  . Constipation 01/28/2012  . Pain in joint, shoulder region 08/02/2011  . Muscle weakness (generalized) 08/02/2011  . Status post rotator cuff repair 08/02/2011  . FOOT PAIN 12/22/2009  . 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 POPLITEAL CYST, LEFT 06/30/2009  . MEDIAL MENISCUS TEAR, LEFT 06/30/2009   Teena Irani, PTA/CLT [phone]  Teena Irani 03/01/2018, 11:49 AM  [facility] [address] [place], [name], [address] Phone: [phone]   Fax:  [phone]  Name: Jo Hale MRN: [id number] Date of Birth: [birth year]

## 2018-03-02 ENCOUNTER — Encounter (HOSPITAL_COMMUNITY): Payer: Self-pay

## 2018-03-02 ENCOUNTER — Ambulatory Visit (HOSPITAL_COMMUNITY): Payer: 59

## 2018-03-02 DIAGNOSIS — M6281 Muscle weakness (generalized): Secondary | ICD-10-CM

## 2018-03-02 DIAGNOSIS — M25562 Pain in left knee: Secondary | ICD-10-CM | POA: Diagnosis not present

## 2018-03-02 DIAGNOSIS — G8929 Other chronic pain: Secondary | ICD-10-CM

## 2018-03-02 DIAGNOSIS — M25561 Pain in right knee: Secondary | ICD-10-CM

## 2018-03-02 DIAGNOSIS — R29898 Other symptoms and signs involving the musculoskeletal system: Secondary | ICD-10-CM

## 2018-03-02 DIAGNOSIS — R2689 Other abnormalities of gait and mobility: Secondary | ICD-10-CM

## 2018-03-02 MED ORDER — NA SULFATE-K SULFATE-MG SULF 17.5-3.13-1.6 GM/177ML PO SOLN: 1.0000 | ORAL | 0 refills | Status: DC

## 2018-03-02 NOTE — Therapy (Signed)
[facility] [place], [name], [address] Phone: [phone]   Fax:  [phone]  Physical Therapy Treatment  Patient Details  Name: Jo Hale MRN: [id number] Date of Birth: 07/18/1960 Referring Provider (PT): Carole Civil, MD   Encounter Date: 03/02/2018  PT End of Session - 03/02/18 1159    Visit Number  6    Number of Visits  13    Date for PT Re-Evaluation  04/03/18    Authorization Type  [place] (no auth required, 60 visit limit - PT/OT/SLP combined - 13 used at eval)    Authorization Time Period  02/20/2018 - 04/07/2018    Authorization - Visit Number  16    Authorization - Number of Visits  60    PT Start Time  0915    PT Stop Time  0945    PT Time Calculation (min)  30 min    Activity Tolerance  Patient tolerated treatment well    Behavior During Therapy  [facility] for tasks assessed/performed       Past Medical History:  Diagnosis Date  . Abnormal pap   . Fibroids   . Fibroids, intramural    450-600 gm uterus  . GERD (gastroesophageal reflux disease)   . Hyperlipidemia   . Hypertension   . Menopause 08/27/2014  . Neoplasm of kidney   . PONV (postoperative nausea and vomiting)   . Posterior pain of right hip 07/31/2013  . Renal cancer, right ([place]) 05/2017  . TIA (transient ischemic attack) 2017 or 2018 unsure  . Vaginal Pap smear, abnormal     Past Surgical History:  Procedure Laterality Date  . CESAREAN SECTION    . COLONOSCOPY  02/19/2011   Fields-incomplete exam, otherwise normal   . LAPAROSCOPIC SUPRACERVICAL HYSTERECTOMY  03/02/2011   Procedure: LAPAROSCOPIC SUPRACERVICAL HYSTERECTOMY;  Surgeon: Jonnie Kind, MD;  Location: [facility];  Service: Gynecology;  Laterality: N/A;  . PARTIAL HYSTERECTOMY    . ROBOT ASSISTED LAPAROSCOPIC NEPHRECTOMY Right 05/16/2017   Procedure: XI ROBOTIC ASSISTED LAPAROSCOPIC PARTIAL NEPHRECTOMY;  Surgeon: Raynelle Bring, MD;  Location: [facility];   Service: Urology;  Laterality: Right;  . ROTATOR CUFF REPAIR     left  . TUBAL LIGATION      There were no vitals filed for this visit.  Subjective Assessment - 03/02/18 0915    Subjective  Really sore from last session yesterday. In pain also behind right knee; left not as bad.                        [facility] Adult PT Treatment/Exercise - 03/02/18 0001      Exercises   Exercises  Knee/Hip      Knee/Hip Exercises: Supine   Bridges  Strengthening;Both;1 set;10 reps    Bridges Limitations  5 sec holds    Bridges with [employer]  Both;10 reps;Limitations   on 12" box with hamstring activation   Other Supine Knee/Hip Exercises  supine clamshell 1x10; 5 sec hold w/ RTB      Knee/Hip Exercises: Sidelying   Clams  both 10 reps 5" holds RTB bilateral      Knee/Hip Exercises: Prone   Other Prone Exercises  prone heel squeeze 10X5"      Manual Therapy   Manual Therapy  Joint mobilization;Soft tissue mobilization    Manual therapy comments  completed separate from all other skilled interventions.    Soft tissue mobilization  Rt hamstring,  posterior knee, Rt AP glide to tib/fib jt             PT Education - 03/02/18 1151    Education Details  Discussed purpose and technique of interventions throughout session.    Person(s) Educated  Patient    Methods  Explanation;Demonstration;Handout    Comprehension  Verbalized understanding;Returned demonstration       PT Short Term Goals - 03/02/18 1156      PT SHORT TERM GOAL #1   Title  Patient will demonstrate understanding and report regular compliance with HEP in order to improve lower extremity strength, trunk stability, balance, and overall functional mobility.     Time  2    Period  Weeks    Status  Achieved      PT SHORT TERM GOAL #2   Title  Patient will improve bil knee ROM by 8 degrees for flexion to demonstrate significant improvement in mobility to improve function with gait and functional activities  around home.     Time  3    Period  Weeks    Status  On-going      PT SHORT TERM GOAL #3   Title  Patient will perform SLS for 15 seconds or more on bil LE's to demonstrate improved safety with stair ambulation and with ability to step in and out of bath/tub to reduce fall risk.    Time  3    Period  Weeks    Status  On-going        PT Long Term Goals - 03/02/18 1156      PT LONG TERM GOAL #1   Title  Patient will demonstrate improved MMT strength of at least 1/2 grade in all tested musculature in order to improve ease of stair navigation and overall functional mobility.     Time  6    Period  Weeks    Status  On-going      PT LONG TERM GOAL #2   Title  Patient will report no greater than a 3/10 pain in bil knees over the course of a 1 week period indicating better tolerance to daily activities.     Time  6    Period  Weeks    Status  On-going      PT LONG TERM GOAL #3   Title  Patient will improve bil hamstring flexibility by 10 degrees each to demonstrate improved flexibility to reduce posterior knee pain.    Time  6    Period  Weeks    Status  On-going      PT LONG TERM GOAL #4   Title  Patient will improve FOTO score by 12% or more to indicate significant improvement in mobility and decreased slef reproted limitation with activities related to knee pain.    Time  6    Period  Weeks    Status  On-going            Plan - 03/02/18 1155    Clinical Impression Statement  Patient arrived 11 minutes late to appointment. Continued with established plan of care. Continued with focus on improving LE and core strength in attempts to decrease bil knee pain. Resumed Rt knee AP glide to increased knee flexion. Pt able to complete all exercises without c/o pain. Completed manual to right posterior knee with more tightness/sensitivities in Rt LE and left. Encouraged patient to complete HEP to reduce DOMS. Continue with established plan of care, progress to more weight bearing  as  able.     Rehab Potential  Good    PT Frequency  2x / week    PT Duration  6 weeks    PT Treatment/Interventions  ADLs/Self Care Home Management;Aquatic Therapy;Cryotherapy;Electrical Stimulation;Iontophoresis 4mg /ml Dexamethasone;Moist Heat;Gait training;Stair training;Functional mobility training;Therapeutic activities;Therapeutic exercise;Balance training;Neuromuscular re-education;Patient/family education;Manual techniques;Passive range of motion;Dry needling;Energy conservation    PT Next Visit Plan   Continue joint mobilization to bil knee joints, AP glide for flexion. Continue hip strengthening and perform soft tissue mobilization to bil hamstrings distal and proximal calves, R > L. Progress to balance and strengthening as able.    PT Home Exercise Plan  02/22/2018 - hamstring/calf stretches, bridge, clamshell; 03/02/2018 - supine and sidelying clams with RTB    Consulted and Agree with Plan of Care  Patient       Patient will benefit from skilled therapeutic intervention in order to improve the following deficits and impairments:  Abnormal gait, Decreased balance, Decreased endurance, Decreased mobility, Difficulty walking, Hypomobility, Obesity, Decreased range of motion, Decreased activity tolerance, Decreased strength, Impaired flexibility, Postural dysfunction, Pain  Visit Diagnosis: Chronic pain of left knee  Chronic pain of right knee  Other abnormalities of gait and mobility  Muscle weakness (generalized)  Other symptoms and signs involving the musculoskeletal system     Problem List Patient Active Problem List   Diagnosis Date Noted  . Screening for colorectal cancer 09/01/2017  . Encounter for gynecological examination with Papanicolaou smear of cervix 09/01/2017  . Neoplasm of right kidney 05/16/2017  . Superficial fungus infection of skin 08/31/2016  . Hyperlipidemia LDL goal <100 09/18/2015  . History of stroke 09/18/2015  . CVA (cerebral infarction) 07/02/2015   . Menopause 08/27/2014  . Meralgia paresthetica of left side 04/05/2014  . Venous stasis of both lower extremities 04/05/2014  . Radicular low back pain 11/07/2013  . Leg weakness, bilateral 11/07/2013  . Lumbago 10/09/2013  . Posterior pain of right hip 07/31/2013  . Esophageal reflux 01/25/2013  . Essential hypertension, benign 01/25/2013  . Rectal bleed 01/28/2012  . Constipation 01/28/2012  . Pain in joint, shoulder region 08/02/2011  . Muscle weakness (generalized) 08/02/2011  . Status post rotator cuff repair 08/02/2011  . FOOT PAIN 12/22/2009  . 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 POPLITEAL CYST, LEFT 06/30/2009  . MEDIAL MENISCUS TEAR, LEFT 06/30/2009   Floria Raveling. Hartnett-Rands, MS, PT [facility] [employer] #[id number] 03/02/2018, 12:00 PM  [facility] [address] [place], [name], [address] Phone: [phone]   Fax:  [phone]  Name: Earnie Rockhold MRN: [id number] Date of Birth: [birth year]

## 2018-03-02 NOTE — Progress Notes (Signed)
We had a cancellation on 03/20/18- I called the pt and she accepted appt. Prep and instructions faxed to the pharmacy and instructions were mailed to the pt.

## 2018-03-02 NOTE — Patient Instructions (Addendum)
Jo Hale  25-May-1960 MRN: [id number]     Procedure Date: 03/20/18 Time to register: 8:30am Place to register: [facility] Procedure Time: 9:30am Scheduled provider: Barney Drain, MD    PREPARATION FOR COLONOSCOPY WITH SUPREP BOWEL PREP KIT  Note: Suprep Bowel Prep Kit is a split-dose (2day) regimen. Consumption of BOTH 6-ounce bottles is required for a complete prep.  Please notify us immediately if you are diabetic, take iron supplements, or if you are on Coumadin or any other blood thinners.                                                                                                                                          1 DAY BEFORE PROCEDURE:  DATE: 03/19/18  DAY: Sunday  clear liquids the entire day - NO SOLID FOOD.   At 6:00pm: Complete steps 1 through 4 below, using ONE (1) 6-ounce bottle, before going to bed. Step 1:  Pour ONE (1) 6-ounce bottle of SUPREP liquid into the mixing container.  Step 2:  Add cool drinking water to the 16 ounce line on the container and mix.  Note: Dilute the solution concentrate as directed prior to use. Step 3:  DRINK ALL the liquid in the container. Step 4:  You MUST drink an additional two (2) or more 16 ounce containers of water over the next one (1) hour.   Continue clear liquids.  DAY OF PROCEDURE:   DATE: 03/20/18  DAY: Monday If you take medications for your heart, blood pressure, or breathing, you may take these medications.   5 hours before your procedure at :4:30am Step 1:  Pour ONE (1) 6-ounce bottle of SUPREP liquid into the mixing container.  Step 2:  Add cool drinking water to the 16 ounce line on the container and mix.  Note: Dilute the solution concentrate as directed prior to use. Step 3:  DRINK ALL the liquid in the container. Step 4:  You MUST drink an additional two (2) or more 16 ounce containers of water over the next one (1) hour. You MUST complete the final glass of water at least 3 hours before  your colonoscopy.   Nothing by mouth past 6:30am  You may take your morning medications with sip of water unless we have instructed otherwise.    Please see below for Dietary Information.  CLEAR LIQUIDS INCLUDE:  Water Jello (NOT red in color)   Ice Popsicles (NOT red in color)   Tea (sugar ok, no milk/cream) Powdered fruit flavored drinks  Coffee (sugar ok, no milk/cream) Gatorade/ Lemonade/ Kool-Aid  (NOT red in color)   Juice: apple, white grape, white cranberry Soft drinks  Clear bullion, consomme, broth (fat free beef/chicken/vegetable)  Carbonated beverages (any kind)  Strained chicken noodle soup Hard Candy   Remember: Clear liquids are liquids that will allow you to see your fingers on the other side  of a clear glass. Be sure liquids are NOT red in color, and not cloudy, but CLEAR.  DO NOT EAT OR DRINK ANY OF THE FOLLOWING:  Dairy products of any kind   Cranberry juice Tomato juice / V8 juice   Grapefruit juice Orange juice     Red grape juice  Do not eat any solid foods, including such foods as: cereal, oatmeal, yogurt, fruits, vegetables, creamed soups, eggs, bread, crackers, pureed foods in a blender, etc.   HELPFUL HINTS FOR DRINKING PREP SOLUTION:   Make sure prep is extremely cold. Mix and refrigerate the the morning of the prep. You may also put in the freezer.   You may try mixing some Crystal Light or Country Time Lemonade if you prefer. Mix in small amounts; add more if necessary.  Try drinking through a straw  Rinse mouth with water or a mouthwash between glasses, to remove after-taste.  Try sipping on a cold beverage /ice/ popsicles between glasses of prep.  Place a piece of sugar-free hard candy in mouth between glasses.  If you become nauseated, try consuming smaller amounts, or stretch out the time between glasses. Stop for 30-60 minutes, then slowly start back drinking.     OTHER INSTRUCTIONS  You will need a responsible adult at least 57 years  of age to accompany you and drive you home. This person must remain in the waiting room during your procedure. The hospital will cancel your procedure if you do not have a responsible adult with you.   1. Wear loose fitting clothing that is easily removed. 2. Leave jewelry and other valuables at home.  3. Remove all body piercing jewelry and leave at home. 4. Total time from sign-in until discharge is approximately 2-3 hours. 5. You should go home directly after your procedure and rest. You can resume normal activities the day after your procedure. 6. The day of your procedure you should not:  Drive  Make legal decisions  Operate machinery  Drink alcohol  Return to work   You may call the office (Dept: [phone]) before 5:00pm, or page the doctor on call [phone]) after 5:00pm, for further instructions, if necessary.   Insurance Information YOU WILL NEED TO CHECK WITH YOUR INSURANCE COMPANY FOR THE BENEFITS OF COVERAGE YOU HAVE FOR THIS PROCEDURE.  UNFORTUNATELY, NOT ALL INSURANCE COMPANIES HAVE BENEFITS TO COVER ALL OR PART OF THESE TYPES OF PROCEDURES.  IT IS YOUR RESPONSIBILITY TO CHECK YOUR BENEFITS, HOWEVER, WE WILL BE GLAD TO ASSIST YOU WITH ANY CODES YOUR INSURANCE COMPANY MAY NEED.    PLEASE NOTE THAT MOST INSURANCE COMPANIES WILL NOT COVER A SCREENING COLONOSCOPY FOR PEOPLE UNDER THE AGE OF 57  IF YOU HAVE BCBS INSURANCE, YOU MAY HAVE BENEFITS FOR A SCREENING COLONOSCOPY BUT IF POLYPS ARE FOUND THE DIAGNOSIS WILL CHANGE AND THEN YOU MAY HAVE A DEDUCTIBLE THAT WILL NEED TO BE MET. SO PLEASE MAKE SURE YOU CHECK YOUR BENEFITS FOR A SCREENING COLONOSCOPY AS WELL AS A DIAGNOSTIC COLONOSCOPY.

## 2018-03-02 NOTE — Addendum Note (Signed)
Addended by: Claudina Lick on: 03/02/2018 04:01 PM   Modules accepted: Orders, SmartSet

## 2018-03-02 NOTE — Patient Instructions (Signed)
External Rotation: Hip - Knees Apart (Hook-Lying)    Lie with hips and knees bent, band tied just above knees. Use red band. Pull knees apart. Hold for _5__ seconds. Repeat _10-15_ times. Do _1__ times a day.  Copyright  VHI. All rights reserved.     HIP: Abduction / External Rotation (Band)    Place band around knees. Lie on side with hips and knees bent. Raise top knee up, squeezing glutes. Keep feet together. Hold _5__ seconds. Use _red_ band. 10-15_ reps per set, _1__ sets per day.   Copyright  VHI. All rights reserved.

## 2018-03-06 ENCOUNTER — Other Ambulatory Visit: Payer: Self-pay

## 2018-03-06 ENCOUNTER — Encounter (HOSPITAL_COMMUNITY): Payer: Self-pay

## 2018-03-06 ENCOUNTER — Ambulatory Visit (HOSPITAL_COMMUNITY): Payer: 59

## 2018-03-06 ENCOUNTER — Encounter (HOSPITAL_COMMUNITY): Payer: 59

## 2018-03-06 DIAGNOSIS — M25562 Pain in left knee: Principal | ICD-10-CM

## 2018-03-06 DIAGNOSIS — M25561 Pain in right knee: Secondary | ICD-10-CM

## 2018-03-06 DIAGNOSIS — M6281 Muscle weakness (generalized): Secondary | ICD-10-CM

## 2018-03-06 DIAGNOSIS — R2689 Other abnormalities of gait and mobility: Secondary | ICD-10-CM

## 2018-03-06 DIAGNOSIS — G8929 Other chronic pain: Secondary | ICD-10-CM

## 2018-03-06 NOTE — Progress Notes (Signed)
Is she on chronic narcotics?

## 2018-03-06 NOTE — Progress Notes (Signed)
Noted. Appropriate.  

## 2018-03-06 NOTE — Progress Notes (Signed)
She takes it only when she has severe back pain.

## 2018-03-06 NOTE — Therapy (Signed)
[place], [name], [address] Phone: [phone]   Fax:  [phone]  Physical Therapy Aquatic Treatment  Patient Details  Name: Jo Hale MRN: [id number] Date of Birth: 08-May-1960 Referring Provider (PT): Carole Civil, MD   Encounter Date: 03/06/2018  PT End of Session - 03/06/18 1516    Visit Number  7    Number of Visits  13    Date for PT Re-Evaluation  04/03/18    Authorization Type  [place] (no auth required, 60 visit limit - PT/OT/SLP combined - 13 used at eval)    Authorization Time Period  02/20/2018 - 04/07/2018    Authorization - Visit Number  37    Authorization - Number of Visits  60    PT Start Time  1302    PT Stop Time  1341    PT Time Calculation (min)  39 min    Activity Tolerance  Patient tolerated treatment well    Behavior During Therapy  [facility] for tasks assessed/performed       Past Medical History:  Diagnosis Date  . Abnormal pap   . Fibroids   . Fibroids, intramural    450-600 gm uterus  . GERD (gastroesophageal reflux disease)   . Hyperlipidemia   . Hypertension   . Menopause 08/27/2014  . Neoplasm of kidney   . PONV (postoperative nausea and vomiting)   . Posterior pain of right hip 07/31/2013  . Renal cancer, right ([place]) 05/2017  . TIA (transient ischemic attack) 2017 or 2018 unsure  . Vaginal Pap smear, abnormal     Past Surgical History:  Procedure Laterality Date  . CESAREAN SECTION    . COLONOSCOPY  02/19/2011   Fields-incomplete exam, otherwise normal   . LAPAROSCOPIC SUPRACERVICAL HYSTERECTOMY  03/02/2011   Procedure: LAPAROSCOPIC SUPRACERVICAL HYSTERECTOMY;  Surgeon: Jonnie Kind, MD;  Location: [facility];  Service: Gynecology;  Laterality: N/A;  . PARTIAL HYSTERECTOMY    . ROBOT ASSISTED LAPAROSCOPIC NEPHRECTOMY Right 05/16/2017   Procedure: XI ROBOTIC ASSISTED LAPAROSCOPIC PARTIAL NEPHRECTOMY;  Surgeon: Raynelle Bring, MD;  Location: [facility];   Service: Urology;  Laterality: Right;  . ROTATOR CUFF REPAIR     left  . TUBAL LIGATION      There were no vitals filed for this visit.  Subjective Assessment - 03/06/18 1502    Subjective  Patient states she had a good weekend and that she is trying to do her exercises. She still is having a lot of pain behind her knees and feels like there are knots in her muscles.     Pertinent History  LBP, TIA, HTN, nephrectomy    Limitations  Sitting;Walking;Standing;House hold activities    Patient Stated Goals  to have less pain with walking and daily activities    Currently in Pain?  Yes    Pain Score  4     Pain Location  Knee    Pain Orientation  Right;Left;Posterior    Pain Descriptors / Indicators  Aching;Tightness    Pain Type  Chronic pain    Pain Onset  More than a month ago    Pain Frequency  Constant    Aggravating Factors   walking or sitting prolonged periods    Pain Relieving Factors  rest and medicine        Adult Aquatic Therapy - 03/06/18 1511      Aquatic Therapy Subjective   Subjective  Patient states she had a good  weekend and that she is trying to do her exercises. She still is having a lot of pain behind her knees and feels like there are knots in her muscles.       Treatment   Exercises   1x 20 reps squats with UE support at wall. 1x 20 reps bil LE heel raise, 3 sec holds, bil UE support. 1x 15 reps bil LE, forward lunge with single UE support. 1x 15 reps bil LE, pendulum with noodle and knee bent. 1x 15 reps bil LE front leg pull on step with 8lbs ankle weights. 1x 15 reps noodle press bil LE with UE support. 1x 15 reps bil LE back leg pull at steps with 8lbs ankle weights. 1x 15 reps side plank with hip abduction at steps. 3x 30 reps gastroc stretch at wall bil LE. 2x 30 reps hamstring stretch at step bil LE.     Specific Exercises  Hip/Low Back    Hip/Low Back  --    Balance  5 reps SLS with 3-way vector bil LE, no UE support, 5 second holds. 2x warrior I/II with  noodle, bilaterally, 30 second holds.         PT Education - 03/06/18 1515    Education Details  Educated on exercises throughout session. Discussed possible benefits of dry needling for knots in bil hamstrings.     Person(s) Educated  Patient    Methods  Explanation    Comprehension  Verbalized understanding       PT Short Term Goals - 03/02/18 1156      PT SHORT TERM GOAL #1   Title  Patient will demonstrate understanding and report regular compliance with HEP in order to improve lower extremity strength, trunk stability, balance, and overall functional mobility.     Time  2    Period  Weeks    Status  Achieved      PT SHORT TERM GOAL #2   Title  Patient will improve bil knee ROM by 8 degrees for flexion to demonstrate significant improvement in mobility to improve function with gait and functional activities around home.     Time  3    Period  Weeks    Status  On-going      PT SHORT TERM GOAL #3   Title  Patient will perform SLS for 15 seconds or more on bil LE's to demonstrate improved safety with stair ambulation and with ability to step in and out of bath/tub to reduce fall risk.    Time  3    Period  Weeks    Status  On-going        PT Long Term Goals - 03/02/18 1156      PT LONG TERM GOAL #1   Title  Patient will demonstrate improved MMT strength of at least 1/2 grade in all tested musculature in order to improve ease of stair navigation and overall functional mobility.     Time  6    Period  Weeks    Status  On-going      PT LONG TERM GOAL #2   Title  Patient will report no greater than a 3/10 pain in bil knees over the course of a 1 week period indicating better tolerance to daily activities.     Time  6    Period  Weeks    Status  On-going      PT LONG TERM GOAL #3   Title  Patient will improve bil hamstring  flexibility by 10 degrees each to demonstrate improved flexibility to reduce posterior knee pain.    Time  6    Period  Weeks    Status  On-going       PT LONG TERM GOAL #4   Title  Patient will improve FOTO score by 12% or more to indicate significant improvement in mobility and decreased slef reproted limitation with activities related to knee pain.    Time  6    Period  Weeks    Status  On-going        Plan - 03/06/18 1517    Clinical Impression Statement  Continued with aquatic therapy this session to address bil knee pain. Patient performed exercises in waist to chest deep water and focus was on LE stretching and functional strengthening. She progressed strengthening with increased weight/resistance, and denied pain throughout. She initiated balance training in water for SLS and warrior I/II for stretch/muscular endurance training. Ms. Tuckett will continue to benefit from skilled PT intervention in aquatic and land based settings to address impairments and improve QOL through mobility.    Rehab Potential  Good    PT Frequency  2x / week    PT Duration  6 weeks    PT Treatment/Interventions  ADLs/Self Care Home Management;Aquatic Therapy;Cryotherapy;Electrical Stimulation;Iontophoresis 4mg /ml Dexamethasone;Moist Heat;Gait training;Stair training;Functional mobility training;Therapeutic activities;Therapeutic exercise;Balance training;Neuromuscular re-education;Patient/family education;Manual techniques;Passive range of motion;Dry needling;Energy conservation    PT Next Visit Plan  Discuss dry needling and if appropriate for patient. Assess bil hamstring tendons on bil LE's to determine if appropriate. Continue joint mobilization to bil knee joints, AP glide for flexion. Continue hip strengthening and perform soft tissue mobilization to bil hamstrings distal and proximal calves, R > L. Progress to balance and strengthening as able.    PT Home Exercise Plan  02/22/2018 - hamstring/calf stretches, bridge, clamshell; 03/02/2018 - supine and sidelying clams with RTB    Consulted and Agree with Plan of Care  Patient       Patient will  benefit from skilled therapeutic intervention in order to improve the following deficits and impairments:  Abnormal gait, Decreased balance, Decreased endurance, Decreased mobility, Difficulty walking, Hypomobility, Obesity, Decreased range of motion, Decreased activity tolerance, Decreased strength, Impaired flexibility, Postural dysfunction, Pain  Visit Diagnosis: Chronic pain of left knee  Chronic pain of right knee  Other abnormalities of gait and mobility  Muscle weakness (generalized)     Problem List Patient Active Problem List   Diagnosis Date Noted  . Screening for colorectal cancer 09/01/2017  . Encounter for gynecological examination with Papanicolaou smear of cervix 09/01/2017  . Neoplasm of right kidney 05/16/2017  . Superficial fungus infection of skin 08/31/2016  . Hyperlipidemia LDL goal <100 09/18/2015  . History of stroke 09/18/2015  . CVA (cerebral infarction) 07/02/2015  . Menopause 08/27/2014  . Meralgia paresthetica of left side 04/05/2014  . Venous stasis of both lower extremities 04/05/2014  . Radicular low back pain 11/07/2013  . Leg weakness, bilateral 11/07/2013  . Lumbago 10/09/2013  . Posterior pain of right hip 07/31/2013  . Esophageal reflux 01/25/2013  . Essential hypertension, benign 01/25/2013  . Rectal bleed 01/28/2012  . Constipation 01/28/2012  . Pain in joint, shoulder region 08/02/2011  . Muscle weakness (generalized) 08/02/2011  . Status post rotator cuff repair 08/02/2011  . FOOT PAIN 12/22/2009  . 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 POPLITEAL CYST, LEFT 06/30/2009  . MEDIAL MENISCUS TEAR, LEFT 06/30/2009    Kipp Brood, PT, DPT  Physical Therapist with [facility]  03/06/2018 3:19 PM     [place], [name], [address] Phone: [phone]   Fax:  [phone]  Name: Jo Hale MRN: [id number] Date of Birth:  09-29-1960

## 2018-03-13 ENCOUNTER — Ambulatory Visit (HOSPITAL_COMMUNITY): Payer: 59 | Attending: Orthopedic Surgery | Admitting: Physical Therapy

## 2018-03-13 ENCOUNTER — Encounter (HOSPITAL_COMMUNITY): Payer: 59 | Admitting: Physical Therapy

## 2018-03-13 ENCOUNTER — Telehealth (HOSPITAL_COMMUNITY): Payer: Self-pay | Admitting: Physical Therapy

## 2018-03-13 DIAGNOSIS — R29898 Other symptoms and signs involving the musculoskeletal system: Secondary | ICD-10-CM | POA: Insufficient documentation

## 2018-03-13 DIAGNOSIS — M25562 Pain in left knee: Secondary | ICD-10-CM | POA: Insufficient documentation

## 2018-03-13 DIAGNOSIS — G8929 Other chronic pain: Secondary | ICD-10-CM | POA: Insufficient documentation

## 2018-03-13 DIAGNOSIS — M6281 Muscle weakness (generalized): Secondary | ICD-10-CM | POA: Insufficient documentation

## 2018-03-13 DIAGNOSIS — M25561 Pain in right knee: Secondary | ICD-10-CM | POA: Insufficient documentation

## 2018-03-13 DIAGNOSIS — R2689 Other abnormalities of gait and mobility: Secondary | ICD-10-CM | POA: Insufficient documentation

## 2018-03-13 NOTE — Telephone Encounter (Signed)
Pt did not show for appointment.  Called and left message regarding missed and next appointment.  Teena Irani, PTA/CLT [phone]

## 2018-03-14 ENCOUNTER — Telehealth: Payer: Self-pay | Admitting: Family Medicine

## 2018-03-14 NOTE — Telephone Encounter (Signed)
Discontinue steroid nasal spray May use saline nasal spray as needed May use humidifier as needed   it is possible that the patient has developed a mild sinus infection The patient has 2 options at this point we can do a round of antibiotics for sinus and infection possibility Or the patient can be scheduled for later this week for reevaluation of her sinuses

## 2018-03-14 NOTE — Telephone Encounter (Signed)
I called and left a message to r/c. 

## 2018-03-14 NOTE — Telephone Encounter (Signed)
Pt states the steroid nasal spray that Dr. Richardson Landry gave pt at last visit is making her nasal symptoms worse at night  Now having green nasal mucous.    Wonders if we can call in something else   Please advise & call pt    [employer]

## 2018-03-14 NOTE — Telephone Encounter (Signed)
I spoke with the pt she says she is not running a fever.She says she was given flonase and it is not helping with her nose being stopped up she says it is mostly at night when one or the other nare is stopped up. She has some mucus green in color at times,but not always. Would like to know what else could be done. Please advise.

## 2018-03-15 ENCOUNTER — Other Ambulatory Visit: Payer: Self-pay

## 2018-03-15 ENCOUNTER — Encounter (HOSPITAL_COMMUNITY): Payer: Self-pay

## 2018-03-15 ENCOUNTER — Telehealth: Payer: Self-pay | Admitting: Radiology

## 2018-03-15 ENCOUNTER — Ambulatory Visit: Payer: 59 | Admitting: Orthopedic Surgery

## 2018-03-15 ENCOUNTER — Ambulatory Visit (HOSPITAL_COMMUNITY): Payer: 59

## 2018-03-15 VITALS — BP 128/78 | HR 79 | Ht 62.0 in | Wt 240.0 lb

## 2018-03-15 DIAGNOSIS — M23203 Derangement of unspecified medial meniscus due to old tear or injury, right knee: Secondary | ICD-10-CM | POA: Diagnosis not present

## 2018-03-15 DIAGNOSIS — Z6841 Body Mass Index (BMI) 40.0 and over, adult: Secondary | ICD-10-CM

## 2018-03-15 DIAGNOSIS — R29898 Other symptoms and signs involving the musculoskeletal system: Secondary | ICD-10-CM | POA: Diagnosis present

## 2018-03-15 DIAGNOSIS — M25562 Pain in left knee: Principal | ICD-10-CM

## 2018-03-15 DIAGNOSIS — R2689 Other abnormalities of gait and mobility: Secondary | ICD-10-CM

## 2018-03-15 DIAGNOSIS — M25561 Pain in right knee: Secondary | ICD-10-CM | POA: Diagnosis not present

## 2018-03-15 DIAGNOSIS — G8929 Other chronic pain: Secondary | ICD-10-CM

## 2018-03-15 DIAGNOSIS — M6281 Muscle weakness (generalized): Secondary | ICD-10-CM | POA: Diagnosis present

## 2018-03-15 NOTE — Progress Notes (Signed)
Chief Complaint  Patient presents with  . Follow-up    Recheck on bilateral knees post PT.   [place] to visit Chief Complaint  Patient presents with  . Knee Pain    Bilateral knee pain.    56 year old female bilateral knee pain for several years presents for evaluation and treatment.  She has pain in the backs of her legs with intermittent swelling pain appears to be popliteal fossa bilaterally.  She says when it swollen she cannot stand her pain reaches a level of 9 out of 10.  She has not had physical therapy.  She tends cannot take Advil or Aleve due to tachycardia  She says occasionally it hurts to turn her legs or turn her knees.  She also has some pain on the stair climbing and descent  She is on Vicodin but that is for her back pain and does not control her knee pain  She has tried walking to help lose weight but the pain in her legs recur and the swelling recurs.  She also has intermittent lower back pain status post multiple injections for a fall she sustained in 2015 or 16 for which she was told she had arthritis or possible pinched nerve   After therapy she reports no improvement continues to complain of bilateral knee pain right greater than left.  Posterior swelling pain and pulling in the right knee  She is completed her physical therapy she cannot take NSAIDs secondary to tachycardia  Review of systems no change from prior visit on October 28 Review of Systems  Constitutional: Negative for chills, fever and weight loss.  Musculoskeletal: Positive for back pain.  Neurological: Negative for tingling, sensory change, focal weakness and weakness.   BP 128/78   Pulse 79   Ht 5\' 2"  (1.575 m)   Wt 240 lb (108.9 kg)   LMP 02/17/2011 Comment: partial   BMI 43.90 kg/m     Encounter Diagnoses  Name Primary?  . Old complex tear of medial meniscus of right knee Yes  . Body mass index 40.0-44.9, adult ([place])   . Morbid obesity ([place])     Recommend MRI right knee  follow-up after MRI

## 2018-03-15 NOTE — Telephone Encounter (Signed)
MRI sch for 03/23/18 at 9 am arrive at 830 called patient to advise/ left message for her to call back let me know if this is okay and to make a follow up appointment

## 2018-03-15 NOTE — Therapy (Signed)
[place], [name], [address] Phone: [phone]   Fax:  [phone]  Physical Therapy Treatment  Patient Details  Name: Jo Hale MRN: [id number] Date of Birth: 1960/09/06 Referring Provider (PT): Carole Civil, MD   Encounter Date: 03/15/2018  PT End of Session - 03/15/18 0917    Visit Number  8    Number of Visits  13    Date for PT Re-Evaluation  04/03/18    Authorization Type  [place] (no auth required, 60 visit limit - PT/OT/SLP combined - 13 used at eval)    Authorization Time Period  02/20/2018 - 04/07/2018    Authorization - Visit Number  21    Authorization - Number of Visits  60    PT Start Time  0906    PT Stop Time  0947    PT Time Calculation (min)  41 min    Activity Tolerance  Patient tolerated treatment well    Behavior During Therapy  [facility] for tasks assessed/performed       Past Medical History:  Diagnosis Date  . Abnormal pap   . Fibroids   . Fibroids, intramural    450-600 gm uterus  . GERD (gastroesophageal reflux disease)   . Hyperlipidemia   . Hypertension   . Menopause 08/27/2014  . Neoplasm of kidney   . PONV (postoperative nausea and vomiting)   . Posterior pain of right hip 07/31/2013  . Renal cancer, right ([place]) 05/2017  . TIA (transient ischemic attack) 2017 or 2018 unsure  . Vaginal Pap smear, abnormal     Past Surgical History:  Procedure Laterality Date  . CESAREAN SECTION    . COLONOSCOPY  02/19/2011   Fields-incomplete exam, otherwise normal   . LAPAROSCOPIC SUPRACERVICAL HYSTERECTOMY  03/02/2011   Procedure: LAPAROSCOPIC SUPRACERVICAL HYSTERECTOMY;  Surgeon: Jonnie Kind, MD;  Location: [facility];  Service: Gynecology;  Laterality: N/A;  . PARTIAL HYSTERECTOMY    . ROBOT ASSISTED LAPAROSCOPIC NEPHRECTOMY Right 05/16/2017   Procedure: XI ROBOTIC ASSISTED LAPAROSCOPIC PARTIAL NEPHRECTOMY;  Surgeon: Raynelle Bring, MD;  Location: [facility];  Service:  Urology;  Laterality: Right;  . ROTATOR CUFF REPAIR     left  . TUBAL LIGATION      There were no vitals filed for this visit.  Subjective Assessment - 03/15/18 0913    Subjective  Patient reports she does feel that therapy is helping but she is still having pain in the back of her knees. She has an MD appointment today to follow up with Dr. Aline Brochure about her knee pain.    Pertinent History  LBP, TIA, HTN, nephrectomy    Limitations  Sitting;Walking;Standing;House hold activities    Patient Stated Goals  to have less pain with walking and daily activities    Currently in Pain?  Yes    Pain Score  5     Pain Location  Knee    Pain Orientation  Right;Left;Posterior    Pain Descriptors / Indicators  Aching;Tightness    Pain Type  Chronic pain    Pain Onset  More than a month ago    Pain Frequency  Constant    Aggravating Factors   walking, squatting, prolonged positions    Pain Relieving Factors  rest, medicine       [facility] Adult PT Treatment/Exercise - 03/15/18 0001      Exercises   Exercises  Knee/Hip      Knee/Hip Exercises: Stretches  Active Hamstring Stretch  Both;3 reps;30 seconds    Active Hamstring Stretch Limitations  with 12" box    Gastroc Stretch  Both;3 reps;30 seconds    Gastroc Stretch Limitations  slant board      Knee/Hip Exercises: Machines for Strengthening   Total Gym Leg Press  [employer] leg press: 2x 20 reps bil LE, 30lbs (3 plates)      Knee/Hip Exercises: Standing   Heel Raises  Both;1 set;20 reps    Forward Lunges  Both;1 set;15 reps    Functional Squat  2 sets;10 reps    Functional Squat Limitations  chair taps with mirror in front    Other Standing Knee Exercises  Side stepping with Red TB around knees, 2x 15' RT      Manual Therapy   Manual Therapy  Soft tissue mobilization    Manual therapy comments  completed separate from all other skilled interventions.    Soft tissue mobilization  Rt/Lt gastrocnemius and bilateral hamstring tendon  insertions at posterior knee        PT Education - 03/15/18 1301    Education Details  Educated on dry needling benefits and will attempt to get patient on schedule with certified therapist.    Person(s) Educated  Patient    Methods  Explanation    Comprehension  Verbalized understanding       PT Short Term Goals - 03/02/18 1156      PT SHORT TERM GOAL #1   Title  Patient will demonstrate understanding and report regular compliance with HEP in order to improve lower extremity strength, trunk stability, balance, and overall functional mobility.     Time  2    Period  Weeks    Status  Achieved      PT SHORT TERM GOAL #2   Title  Patient will improve bil knee ROM by 8 degrees for flexion to demonstrate significant improvement in mobility to improve function with gait and functional activities around home.     Time  3    Period  Weeks    Status  On-going      PT SHORT TERM GOAL #3   Title  Patient will perform SLS for 15 seconds or more on bil LE's to demonstrate improved safety with stair ambulation and with ability to step in and out of bath/tub to reduce fall risk.    Time  3    Period  Weeks    Status  On-going        PT Long Term Goals - 03/02/18 1156      PT LONG TERM GOAL #1   Title  Patient will demonstrate improved MMT strength of at least 1/2 grade in all tested musculature in order to improve ease of stair navigation and overall functional mobility.     Time  6    Period  Weeks    Status  On-going      PT LONG TERM GOAL #2   Title  Patient will report no greater than a 3/10 pain in bil knees over the course of a 1 week period indicating better tolerance to daily activities.     Time  6    Period  Weeks    Status  On-going      PT LONG TERM GOAL #3   Title  Patient will improve bil hamstring flexibility by 10 degrees each to demonstrate improved flexibility to reduce posterior knee pain.    Time  6  Period  Weeks    Status  On-going      PT LONG TERM  GOAL #4   Title  Patient will improve FOTO score by 12% or more to indicate significant improvement in mobility and decreased slef reproted limitation with activities related to knee pain.    Time  6    Period  Weeks    Status  On-going         Plan - 03/15/18 0917    Clinical Impression Statement  Continued with focus on functional strengthening this session for bil LE's. Initiate functional squats and leg press today. Patient reported pain in bil knees along posterolateral knee/lower leg with squats but denied discomfort with leg press. She required multimodal cuing to perform exercises with correct form throughout session. EOS therapist performed soft tissue mobilization to bil posterior knees as patient has several palpable knots in her gastrocnemius and reports tenderness along bil hamstring attachments. She could benefit from trigger point dry needling to address myofascial restrictions to reduce pain.     Rehab Potential  Good    PT Frequency  2x / week    PT Duration  6 weeks    PT Treatment/Interventions  ADLs/Self Care Home Management;Aquatic Therapy;Cryotherapy;Electrical Stimulation;Iontophoresis 4mg /ml Dexamethasone;Moist Heat;Gait training;Stair training;Functional mobility training;Therapeutic activities;Therapeutic exercise;Balance training;Neuromuscular re-education;Patient/family education;Manual techniques;Passive range of motion;Dry needling;Energy conservation    PT Next Visit Plan  Follow up on appts with Brooke for dry needling. Assess bil hamstring tendons on bil LE's to determine if appropriate. Continue joint mobilization to bil knee joints, AP glide for flexion. Continue hip strengthening and perform soft tissue mobilization to bil hamstrings distal and proximal calves, R > L. Progress to balance and strengthening as able.    PT Home Exercise Plan  02/22/2018 - hamstring/calf stretches, bridge, clamshell; 03/02/2018 - supine and sidelying clams with RTB    Consulted and  Agree with Plan of Care  Patient       Patient will benefit from skilled therapeutic intervention in order to improve the following deficits and impairments:  Abnormal gait, Decreased balance, Decreased endurance, Decreased mobility, Difficulty walking, Hypomobility, Obesity, Decreased range of motion, Decreased activity tolerance, Decreased strength, Impaired flexibility, Postural dysfunction, Pain  Visit Diagnosis: Chronic pain of left knee  Chronic pain of right knee  Other abnormalities of gait and mobility  Muscle weakness (generalized)  Other symptoms and signs involving the musculoskeletal system     Problem List Patient Active Problem List   Diagnosis Date Noted  . Screening for colorectal cancer 09/01/2017  . Encounter for gynecological examination with Papanicolaou smear of cervix 09/01/2017  . Neoplasm of right kidney 05/16/2017  . Superficial fungus infection of skin 08/31/2016  . Hyperlipidemia LDL goal <100 09/18/2015  . History of stroke 09/18/2015  . CVA (cerebral infarction) 07/02/2015  . Menopause 08/27/2014  . Meralgia paresthetica of left side 04/05/2014  . Venous stasis of both lower extremities 04/05/2014  . Radicular low back pain 11/07/2013  . Leg weakness, bilateral 11/07/2013  . Lumbago 10/09/2013  . Posterior pain of right hip 07/31/2013  . Esophageal reflux 01/25/2013  . Essential hypertension, benign 01/25/2013  . Rectal bleed 01/28/2012  . Constipation 01/28/2012  . Pain in joint, shoulder region 08/02/2011  . Muscle weakness (generalized) 08/02/2011  . Status post rotator cuff repair 08/02/2011  . FOOT PAIN 12/22/2009  . 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 POPLITEAL CYST, LEFT 06/30/2009  . MEDIAL MENISCUS TEAR, LEFT 06/30/2009    Kipp Brood, PT, DPT  Physical Therapist with [facility]  03/15/2018 1:11 PM     [place],  [name], [address] Phone: [phone]   Fax:  [phone]  Name: Jo Hale MRN: [id number] Date of Birth: 17-Jun-1960

## 2018-03-16 ENCOUNTER — Ambulatory Visit (HOSPITAL_COMMUNITY): Payer: 59

## 2018-03-16 ENCOUNTER — Other Ambulatory Visit: Payer: Self-pay | Admitting: *Deleted

## 2018-03-16 ENCOUNTER — Encounter (HOSPITAL_COMMUNITY): Payer: Self-pay

## 2018-03-16 DIAGNOSIS — M6281 Muscle weakness (generalized): Secondary | ICD-10-CM

## 2018-03-16 DIAGNOSIS — M25562 Pain in left knee: Principal | ICD-10-CM

## 2018-03-16 DIAGNOSIS — G8929 Other chronic pain: Secondary | ICD-10-CM

## 2018-03-16 DIAGNOSIS — R2689 Other abnormalities of gait and mobility: Secondary | ICD-10-CM

## 2018-03-16 DIAGNOSIS — M25561 Pain in right knee: Secondary | ICD-10-CM

## 2018-03-16 NOTE — Therapy (Signed)
[place], [name], [address] Phone: [phone]   Fax:  [phone]  Physical Therapy Treatment  Patient Details  Name: Jo Hale MRN: [id number] Date of Birth: 07-31-1960 Referring Provider (PT): Carole Civil, MD   Encounter Date: 03/16/2018  PT End of Session - 03/16/18 0913    Visit Number  9    Number of Visits  13    Date for PT Re-Evaluation  04/03/18    Authorization Type  [place] (no auth required, 60 visit limit - PT/OT/SLP combined - 13 used at eval)    Authorization Time Period  02/20/2018 - 04/07/2018    Authorization - Visit Number  69    Authorization - Number of Visits  60    PT Start Time  0908    PT Stop Time  0946    PT Time Calculation (min)  38 min    Activity Tolerance  Patient tolerated treatment well    Behavior During Therapy  [facility] for tasks assessed/performed       Past Medical History:  Diagnosis Date  . Abnormal pap   . Fibroids   . Fibroids, intramural    450-600 gm uterus  . GERD (gastroesophageal reflux disease)   . Hyperlipidemia   . Hypertension   . Menopause 08/27/2014  . Neoplasm of kidney   . PONV (postoperative nausea and vomiting)   . Posterior pain of right hip 07/31/2013  . Renal cancer, right ([place]) 05/2017  . TIA (transient ischemic attack) 2017 or 2018 unsure  . Vaginal Pap smear, abnormal     Past Surgical History:  Procedure Laterality Date  . CESAREAN SECTION    . COLONOSCOPY  02/19/2011   Fields-incomplete exam, otherwise normal   . LAPAROSCOPIC SUPRACERVICAL HYSTERECTOMY  03/02/2011   Procedure: LAPAROSCOPIC SUPRACERVICAL HYSTERECTOMY;  Surgeon: Jonnie Kind, MD;  Location: [facility];  Service: Gynecology;  Laterality: N/A;  . PARTIAL HYSTERECTOMY    . ROBOT ASSISTED LAPAROSCOPIC NEPHRECTOMY Right 05/16/2017   Procedure: XI ROBOTIC ASSISTED LAPAROSCOPIC PARTIAL NEPHRECTOMY;  Surgeon: Raynelle Bring, MD;  Location: [facility];  Service:  Urology;  Laterality: Right;  . ROTATOR CUFF REPAIR     left  . TUBAL LIGATION      There were no vitals filed for this visit.  Subjective Assessment - 03/16/18 0911    Subjective  Pt stated she went to MD yesterday and has MRI scheduled for 03/23/18.  Current pain scale 4/10 Rt posterior knee.  Reports manual and stretches helped following last session.      Pertinent History  LBP, TIA, HTN, nephrectomy    Patient Stated Goals  to have less pain with walking and daily activities    Currently in Pain?  Yes    Pain Score  4     Pain Location  Knee    Pain Orientation  Right;Posterior    Pain Descriptors / Indicators  Tightness;Aching    Pain Type  Chronic pain    Pain Onset  More than a month ago    Pain Frequency  Constant    Aggravating Factors   walking, squatting, prolonged positions    Pain Relieving Factors  rest, medicine                       [facility] Adult PT Treatment/Exercise - 03/16/18 0001      Exercises   Exercises  Knee/Hip      Knee/Hip Exercises:  Stretches   Active Hamstring Stretch  Both;3 reps;30 seconds    Active Hamstring Stretch Limitations  with 12" box    Gastroc Stretch  Both;3 reps;30 seconds    Gastroc Stretch Limitations  slant board      Knee/Hip Exercises: Machines for Strengthening   Total Gym Leg Press  [employer] leg press: 2x 20 reps bil LE, 30lbs (3 plates)      Knee/Hip Exercises: Standing   Heel Raises  Both;1 set;20 reps    Forward Lunges  --   limited by time, resume next session   Functional Squat  2 sets;10 reps    Functional Squat Limitations  chair taps with mirror in front    Other Standing Knee Exercises  Side stepping with Red TB around knees, 2x 15' RT      Manual Therapy   Manual Therapy  Soft tissue mobilization    Manual therapy comments  completed separate from all other skilled interventions.    Joint Mobilization  tib/fib mob grade II    Soft tissue mobilization  Rt/Lt gastrocnemius and bilateral  hamstring tendon insertions at posterior knee               PT Short Term Goals - 03/02/18 1156      PT SHORT TERM GOAL #1   Title  Patient will demonstrate understanding and report regular compliance with HEP in order to improve lower extremity strength, trunk stability, balance, and overall functional mobility.     Time  2    Period  Weeks    Status  Achieved      PT SHORT TERM GOAL #2   Title  Patient will improve bil knee ROM by 8 degrees for flexion to demonstrate significant improvement in mobility to improve function with gait and functional activities around home.     Time  3    Period  Weeks    Status  On-going      PT SHORT TERM GOAL #3   Title  Patient will perform SLS for 15 seconds or more on bil LE's to demonstrate improved safety with stair ambulation and with ability to step in and out of bath/tub to reduce fall risk.    Time  3    Period  Weeks    Status  On-going        PT Long Term Goals - 03/02/18 1156      PT LONG TERM GOAL #1   Title  Patient will demonstrate improved MMT strength of at least 1/2 grade in all tested musculature in order to improve ease of stair navigation and overall functional mobility.     Time  6    Period  Weeks    Status  On-going      PT LONG TERM GOAL #2   Title  Patient will report no greater than a 3/10 pain in bil knees over the course of a 1 week period indicating better tolerance to daily activities.     Time  6    Period  Weeks    Status  On-going      PT LONG TERM GOAL #3   Title  Patient will improve bil hamstring flexibility by 10 degrees each to demonstrate improved flexibility to reduce posterior knee pain.    Time  6    Period  Weeks    Status  On-going      PT LONG TERM GOAL #4   Title  Patient will improve FOTO score  by 12% or more to indicate significant improvement in mobility and decreased slef reproted limitation with activities related to knee pain.    Time  6    Period  Weeks    Status   On-going            Plan - 03/16/18 1247    Clinical Impression Statement  Session focus on BLE strengthening and manual techniques to address soft tissue restrictions in gastroc/hamstring area.  Pt able to demonstrate appropriate techqniue with min cueing for proper weight distribution with squats to reduce strain on anterior knee as well as posteriolateral region on knee.  EOS with manual to address soft tissue restricitons in gastrocs and reports of tenderness along hamstring insertion points.  Pt reports an MRI planned for 03/23/18.  No reports of pain at EOS.      Rehab Potential  Good    PT Frequency  2x / week    PT Duration  6 weeks    PT Treatment/Interventions  ADLs/Self Care Home Management;Aquatic Therapy;Cryotherapy;Electrical Stimulation;Iontophoresis 4mg /ml Dexamethasone;Moist Heat;Gait training;Stair training;Functional mobility training;Therapeutic activities;Therapeutic exercise;Balance training;Neuromuscular re-education;Patient/family education;Manual techniques;Passive range of motion;Dry needling;Energy conservation    PT Next Visit Plan  Follow up on appts with Brooke for dry needling. Assess bil hamstring tendons on bil LE's to determine if appropriate. Continue joint mobilization to bil knee joints, AP glide for flexion. Continue hip strengthening and perform soft tissue mobilization to bil hamstrings distal and proximal calves, R > L. Progress to balance and strengthening as able.    PT Home Exercise Plan  02/22/2018 - hamstring/calf stretches, bridge, clamshell; 03/02/2018 - supine and sidelying clams with RTB       Patient will benefit from skilled therapeutic intervention in order to improve the following deficits and impairments:  Abnormal gait, Decreased balance, Decreased endurance, Decreased mobility, Difficulty walking, Hypomobility, Obesity, Decreased range of motion, Decreased activity tolerance, Decreased strength, Impaired flexibility, Postural dysfunction,  Pain  Visit Diagnosis: Chronic pain of left knee  Chronic pain of right knee  Other abnormalities of gait and mobility  Muscle weakness (generalized)     Problem List Patient Active Problem List   Diagnosis Date Noted  . Screening for colorectal cancer 09/01/2017  . Encounter for gynecological examination with Papanicolaou smear of cervix 09/01/2017  . Neoplasm of right kidney 05/16/2017  . Superficial fungus infection of skin 08/31/2016  . Hyperlipidemia LDL goal <100 09/18/2015  . History of stroke 09/18/2015  . CVA (cerebral infarction) 07/02/2015  . Menopause 08/27/2014  . Meralgia paresthetica of left side 04/05/2014  . Venous stasis of both lower extremities 04/05/2014  . Radicular low back pain 11/07/2013  . Leg weakness, bilateral 11/07/2013  . Lumbago 10/09/2013  . Posterior pain of right hip 07/31/2013  . Esophageal reflux 01/25/2013  . Essential hypertension, benign 01/25/2013  . Rectal bleed 01/28/2012  . Constipation 01/28/2012  . Pain in joint, shoulder region 08/02/2011  . Muscle weakness (generalized) 08/02/2011  . Status post rotator cuff repair 08/02/2011  . FOOT PAIN 12/22/2009  . 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 POPLITEAL CYST, LEFT 06/30/2009  . MEDIAL MENISCUS TEAR, LEFT 06/30/2009   Ihor Austin, LPTA; [place]  Aldona Lento 03/16/2018, 12:51 PM  [place], [name], [address] Phone: [phone]   Fax:  [phone]  Name: Jo Hale MRN: [id number] Date of Birth: 1960-11-23

## 2018-03-20 ENCOUNTER — Ambulatory Visit (HOSPITAL_COMMUNITY)
Admission: RE | Admit: 2018-03-20 | Discharge: 2018-03-20 | Disposition: A | Discharge status: Home / Self Care | Payer: 59 | Source: Ambulatory Visit | Attending: Gastroenterology | Admitting: Gastroenterology

## 2018-03-20 ENCOUNTER — Ambulatory Visit (HOSPITAL_COMMUNITY): Payer: 59

## 2018-03-20 ENCOUNTER — Encounter (HOSPITAL_COMMUNITY)
Admission: RE | Disposition: A | Discharge status: Home / Self Care | Payer: Self-pay | Source: Ambulatory Visit | Attending: Gastroenterology

## 2018-03-20 ENCOUNTER — Encounter (HOSPITAL_COMMUNITY): Payer: Self-pay

## 2018-03-20 ENCOUNTER — Encounter (HOSPITAL_COMMUNITY): Payer: 59

## 2018-03-20 ENCOUNTER — Other Ambulatory Visit: Payer: Self-pay

## 2018-03-20 DIAGNOSIS — Z1211 Encounter for screening for malignant neoplasm of colon: Secondary | ICD-10-CM

## 2018-03-20 DIAGNOSIS — I10 Essential (primary) hypertension: Secondary | ICD-10-CM | POA: Diagnosis not present

## 2018-03-20 DIAGNOSIS — Z905 Acquired absence of kidney: Secondary | ICD-10-CM | POA: Insufficient documentation

## 2018-03-20 DIAGNOSIS — Q438 Other specified congenital malformations of intestine: Secondary | ICD-10-CM

## 2018-03-20 DIAGNOSIS — Z79899 Other long term (current) drug therapy: Secondary | ICD-10-CM | POA: Diagnosis not present

## 2018-03-20 DIAGNOSIS — K648 Other hemorrhoids: Secondary | ICD-10-CM

## 2018-03-20 DIAGNOSIS — Z85528 Personal history of other malignant neoplasm of kidney: Secondary | ICD-10-CM | POA: Insufficient documentation

## 2018-03-20 DIAGNOSIS — Z888 Allergy status to other drugs, medicaments and biological substances status: Secondary | ICD-10-CM | POA: Diagnosis not present

## 2018-03-20 DIAGNOSIS — Z8673 Personal history of transient ischemic attack (TIA), and cerebral infarction without residual deficits: Secondary | ICD-10-CM | POA: Insufficient documentation

## 2018-03-20 DIAGNOSIS — Z7951 Long term (current) use of inhaled steroids: Secondary | ICD-10-CM | POA: Insufficient documentation

## 2018-03-20 DIAGNOSIS — Z8249 Family history of ischemic heart disease and other diseases of the circulatory system: Secondary | ICD-10-CM | POA: Diagnosis not present

## 2018-03-20 DIAGNOSIS — K219 Gastro-esophageal reflux disease without esophagitis: Secondary | ICD-10-CM | POA: Diagnosis not present

## 2018-03-20 DIAGNOSIS — E785 Hyperlipidemia, unspecified: Secondary | ICD-10-CM | POA: Insufficient documentation

## 2018-03-20 HISTORY — PX: COLONOSCOPY: SHX5424

## 2018-03-20 SURGERY — COLONOSCOPY
Anesthesia: Moderate Sedation

## 2018-03-20 MED ORDER — MEPERIDINE HCL 100 MG/ML IJ SOLN
INTRAMUSCULAR | Status: AC
  Filled 2018-03-20: qty 2

## 2018-03-20 MED ORDER — MEPERIDINE HCL 100 MG/ML IJ SOLN
INTRAMUSCULAR | Status: DC | PRN
  Administered 2018-03-20: 50 mg via INTRAVENOUS
  Administered 2018-03-20: 25 mg via INTRAVENOUS

## 2018-03-20 MED ORDER — MIDAZOLAM HCL 5 MG/5ML IJ SOLN
INTRAMUSCULAR | Status: DC | PRN
  Administered 2018-03-20: 1 mg via INTRAVENOUS
  Administered 2018-03-20 (×2): 2 mg via INTRAVENOUS

## 2018-03-20 MED ORDER — MIDAZOLAM HCL 5 MG/5ML IJ SOLN
INTRAMUSCULAR | Status: AC
  Filled 2018-03-20: qty 10

## 2018-03-20 MED ORDER — SODIUM CHLORIDE 0.9 % IV SOLN
INTRAVENOUS | Status: DC
  Administered 2018-03-20: 09:00:00 via INTRAVENOUS

## 2018-03-20 MED ORDER — STERILE WATER FOR IRRIGATION IR SOLN
Status: DC | PRN
  Administered 2018-03-20: 100 mL

## 2018-03-20 NOTE — Discharge Instructions (Signed)
You have SMALL internal hemorrhoids. YOU DID NOT HAVE ANY POLYPS.   DRINK WATER TO KEEP YOUR URINE LIGHT YELLOW.  FOLLOW A HIGH FIBER DIET. AVOID ITEMS THAT CAUSE BLOATING. SEE INFO BELOW.  CONTINUE YOUR WEIGHT LOSS EFFORTS. YOUR BODY MASS INDEX IS OVER 40 WHICH MEANS YOU ARE MORBIDLY OBESE. A BMI OVER 40 SHORTENS YOUR LIFE EXPECTANCY 10 YEARS. OBESITY IS ASSOCIATED WITH AN INCREASED FOR CIRRHOSIS AND ALL CANCERS, INCLUDING ESOPHAGEAL AND COLON CANCER. A WEIGHT OF 210 LBS OR LESS WILL GET YOUR BODY MASS INDEX(BMI) UNDER 40 BUT YOUR BODY MASS INDEX WILL STILL BE OVER 30 WHICH MEANS YOU ARE OBESE. ULTIMATELY YOU SHOULD SHOOT FOR A WEIGHT OF 160 LBS OR LESS  WILL GET YOUR BODY MASS INDEX(BMI) UNDER 30.   USE PREPARATION H FOUR TIMES  A DAY IF NEEDED TO RELIEVE RECTAL PAIN/PRESSURE/BLEEDING.   Next colonoscopy in 10 years.  Colonoscopy Care After Read the instructions outlined below and refer to this sheet in the next week. These discharge instructions provide you with general information on caring for yourself after you leave the hospital. While your treatment has been planned according to the most current medical practices available, unavoidable complications occasionally occur. If you have any problems or questions after discharge, call DR. Raja Liska, [phone].  ACTIVITY  You may resume your regular activity, but move at a slower pace for the next 24 hours.   Take frequent rest periods for the next 24 hours.   Walking will help get rid of the air and reduce the bloated feeling in your belly (abdomen).   No driving for 24 hours (because of the medicine (anesthesia) used during the test).   You may shower.   Do not sign any important legal documents or operate any machinery for 24 hours (because of the anesthesia used during the test).    NUTRITION  Drink plenty of fluids.   You may resume your normal diet as instructed by your doctor.   Begin with a light meal and progress to  your normal diet. Heavy or fried foods are harder to digest and may make you feel sick to your stomach (nauseated).   Avoid alcoholic beverages for 24 hours or as instructed.    MEDICATIONS  You may resume your normal medications.   WHAT YOU CAN EXPECT TODAY  Some feelings of bloating in the abdomen.   Passage of more gas than usual.   Spotting of blood in your stool or on the toilet paper  .  IF YOU HAD POLYPS REMOVED DURING THE COLONOSCOPY:  Eat a soft diet IF YOU HAVE NAUSEA, BLOATING, ABDOMINAL PAIN, OR VOMITING.    FINDING OUT THE RESULTS OF YOUR TEST Not all test results are available during your visit. DR. Oneida Alar WILL CALL YOU WITHIN 14 DAYS OF YOUR PROCEDUE WITH YOUR RESULTS. Do not assume everything is normal if you have not heard from DR. Andreas Sobolewski, CALL HER OFFICE AT [phone].  SEEK IMMEDIATE MEDICAL ATTENTION AND CALL THE OFFICE: [phone] IF:  You have more than a spotting of blood in your stool.   Your belly is swollen (abdominal distention).   You are nauseated or vomiting.   You have a temperature over 101F.   You have abdominal pain or discomfort that is severe or gets worse throughout the day.  High-Fiber Diet A high-fiber diet changes your normal diet to include more whole grains, legumes, fruits, and vegetables. Changes in the diet involve replacing refined carbohydrates with unrefined foods. The calorie level  of the diet is essentially unchanged. The Dietary Reference Intake (recommended amount) for adult males is 38 grams per day. For adult females, it is 25 grams per day. Pregnant and lactating women should consume 28 grams of fiber per day. Fiber is the intact part of a plant that is not broken down during digestion. Functional fiber is fiber that has been isolated from the plant to provide a beneficial effect in the body.  PURPOSE  Increase stool bulk.   Ease and regulate bowel movements.   Lower cholesterol.   REDUCE RISK OF COLON  CANCER  INDICATIONS THAT YOU NEED MORE FIBER  Constipation and hemorrhoids.   Uncomplicated diverticulosis (intestine condition) and irritable bowel syndrome.   Weight management.   As a protective measure against hardening of the arteries (atherosclerosis), diabetes, and cancer.   GUIDELINES FOR INCREASING FIBER IN THE DIET  Start adding fiber to the diet slowly. A gradual increase of about 5 more grams (2 slices of whole-wheat bread, 2 servings of most fruits or vegetables, or 1 bowl of high-fiber cereal) per day is best. Too rapid an increase in fiber may result in constipation, flatulence, and bloating.   Drink enough water and fluids to keep your urine clear or pale yellow. Water, juice, or caffeine-free drinks are recommended. Not drinking enough fluid may cause constipation.   Eat a variety of high-fiber foods rather than one type of fiber.   Try to increase your intake of fiber through using high-fiber foods rather than fiber pills or supplements that contain small amounts of fiber.   The goal is to change the types of food eaten. Do not supplement your present diet with high-fiber foods, but replace foods in your present diet.    INCLUDE A VARIETY OF FIBER SOURCES  Replace refined and processed grains with whole grains, canned fruits with fresh fruits, and incorporate other fiber sources. White rice, white breads, and most bakery goods contain little or no fiber.   Brown whole-grain rice, buckwheat oats, and many fruits and vegetables are all good sources of fiber. These include: broccoli, Brussels sprouts, cabbage, cauliflower, beets, sweet potatoes, white potatoes (skin on), carrots, tomatoes, eggplant, squash, berries, fresh fruits, and dried fruits.   Cereals appear to be the richest source of fiber. Cereal fiber is found in whole grains and bran. Bran is the fiber-rich outer coat of cereal grain, which is largely removed in refining. In whole-grain cereals, the bran  remains. In breakfast cereals, the largest amount of fiber is found in those with "bran" in their names. The fiber content is sometimes indicated on the label.   You may need to include additional fruits and vegetables each day.   In baking, for 1 cup white flour, you may use the following substitutions:   1 cup whole-wheat flour minus 2 tablespoons.   1/2 cup white flour plus 1/2 cup whole-wheat flour.   Hemorrhoids Hemorrhoids are dilated (enlarged) veins around the rectum. Sometimes clots will form in the veins. This makes them swollen and painful. These are called thrombosed hemorrhoids. Causes of hemorrhoids include:  Constipation.   Straining to have a bowel movement.   HEAVY LIFTING  HOME CARE INSTRUCTIONS  Eat a well balanced diet and drink 6 to 8 glasses of water every day to avoid constipation. You may also use a bulk laxative.   Avoid straining to have bowel movements.   Keep anal area dry and clean.   Do not use a donut shaped pillow or sit on  the toilet for long periods. This increases blood pooling and pain.   Move your bowels when your body has the urge; this will require less straining and will decrease pain and pressure.

## 2018-03-20 NOTE — Op Note (Signed)
[facility] Patient Name: Jo Hale Procedure Date: 03/20/2018 9:28 AM MRN: [id number] Date of Birth: 1961/02/02 Attending MD: Barney Drain MD, MD CSN: [id number] Age: 57 Admit Type: Outpatient Procedure:                Colonoscopy, SCREENING Indications:              Screening for colorectal malignant neoplasm Providers:                Barney Drain MD, MD, Rosina Lowenstein, RN, Randa Spike, Technician Referring MD:             Rosemary Holms, MD Medicines:                Meperidine 75 mg IV, Midazolam 5 mg IV Complications:            No immediate complications. Estimated Blood Loss:     Estimated blood loss: none. Procedure:                Pre-Anesthesia Assessment:                           - Prior to the procedure, a History and Physical                            was performed, and patient medications and                            allergies were reviewed. The patient's tolerance of                            previous anesthesia was also reviewed. The risks                            and benefits of the procedure and the sedation                            options and risks were discussed with the patient.                            All questions were answered, and informed consent                            was obtained. Prior Anticoagulants: The patient has                            taken no previous anticoagulant or antiplatelet                            agents. ASA Grade Assessment: II - A patient with                            mild systemic disease. After reviewing the risks  and benefits, the patient was deemed in                            satisfactory condition to undergo the procedure.                            After obtaining informed consent, the colonoscope                            was passed under direct vision. Throughout the                            procedure, the patient's blood pressure,  pulse, and                            oxygen saturations were monitored continuously. The                            [id number]-[id number] ([id number]) scope was introduced through                            the anus and advanced to the the cecum, identified                            by appendiceal orifice and ileocecal valve. The                            colonoscopy was technically difficult and complex                            due to restricted mobility of the colon and a                            tortuous colon. Successful completion of the                            procedure was aided by increasing the dose of                            sedation medication, straightening and shortening                            the scope to obtain bowel loop reduction and                            COLOWRAP. The patient tolerated the procedure                            fairly well. The quality of the bowel preparation                            was excellent. The ileocecal valve, appendiceal  orifice, and rectum were photographed. Scope In: 9:52:09 AM Scope Out: 10:06:50 AM Scope Withdrawal Time: 0 hours 8 minutes 54 seconds  Total Procedure Duration: 0 hours 14 minutes 41 seconds  Findings:      The recto-sigmoid colon, sigmoid colon and descending colon were       moderately tortuous.      Internal hemorrhoids were found. The hemorrhoids were small. Impression:               - Tortuous RECTOSIGMOID colon.                           - Internal hemorrhoids. Moderate Sedation:      Moderate (conscious) sedation was administered by the endoscopy nurse       and supervised by the endoscopist. The following parameters were       monitored: oxygen saturation, heart rate, blood pressure, and response       to care. Total physician intraservice time was 27 minutes. Recommendation:           - Patient has a contact number available for                            emergencies. The signs  and symptoms of potential                            delayed complications were discussed with the                            patient. Return to normal activities tomorrow.                            Written discharge instructions were provided to the                            patient.                           - High fiber diet.                           - Continue present medications.                           - Repeat colonoscopy in 10 years for surveillance. Procedure Code(s):        --- Professional ---                           [phone], Colonoscopy, flexible; diagnostic, including                            collection of specimen(s) by brushing or washing,                            when performed (separate procedure)                           99153, Moderate sedation; each additional 15  minutes intraservice time                           G0500, Moderate sedation services provided by the                            same physician or other qualified health care                            professional performing a gastrointestinal                            endoscopic service that sedation supports,                            requiring the presence of an independent trained                            observer to assist in the monitoring of the                            patient's level of consciousness and physiological                            status; initial 15 minutes of intra-service time;                            patient age 76 years or older (additional time may                            be reported with [id number], as appropriate) Diagnosis Code(s):        --- Professional ---                           Z12.11, Encounter for screening for malignant                            neoplasm of colon                           K64.8, Other hemorrhoids                           Q43.8, Other specified congenital malformations of                             intestine CPT copyright 2018 [employer]. All rights reserved. The codes documented in this report are preliminary and upon coder review may  be revised to meet current compliance requirements. Barney Drain, MD Barney Drain MD, MD 03/20/2018 10:42:29 AM This report has been signed electronically. Number of Addenda: 0

## 2018-03-20 NOTE — H&P (Signed)
Primary Care Physician:  Mikey Kirschner, MD Primary Gastroenterologist:  Dr. Oneida Alar  Pre-Procedure History & Physical: HPI:  Jo Hale is a 57 y.o. female here for [place].  Past Medical History:  Diagnosis Date  . Abnormal pap   . Fibroids   . Fibroids, intramural    450-600 gm uterus  . GERD (gastroesophageal reflux disease)   . Hyperlipidemia   . Hypertension   . Menopause 08/27/2014  . Neoplasm of kidney   . PONV (postoperative nausea and vomiting)   . Posterior pain of right hip 07/31/2013  . Renal cancer, right ([facility]) 05/2017  . TIA (transient ischemic attack) 2017 or 2018 unsure  . Vaginal Pap smear, abnormal     Past Surgical History:  Procedure Laterality Date  . CESAREAN SECTION    . COLONOSCOPY  02/19/2011   Charlissa Petros-incomplete exam, otherwise normal   . LAPAROSCOPIC SUPRACERVICAL HYSTERECTOMY  03/02/2011   Procedure: LAPAROSCOPIC SUPRACERVICAL HYSTERECTOMY;  Surgeon: Jonnie Kind, MD;  Location: [facility];  Service: Gynecology;  Laterality: N/A;  . PARTIAL HYSTERECTOMY    . ROBOT ASSISTED LAPAROSCOPIC NEPHRECTOMY Right 05/16/2017   Procedure: XI ROBOTIC ASSISTED LAPAROSCOPIC PARTIAL NEPHRECTOMY;  Surgeon: Raynelle Bring, MD;  Location: [facility];  Service: Urology;  Laterality: Right;  . ROTATOR CUFF REPAIR     left  . TUBAL LIGATION      Prior to Admission medications   Medication Sig Start Date End Date Taking? Authorizing Provider  atorvastatin (LIPITOR) 20 MG tablet Take 1 tablet (20 mg total) by mouth daily. 02/09/18  Yes Mikey Kirschner, MD  diphenhydrAMINE (BENADRYL) 25 mg capsule Take 25 mg by mouth daily as needed for allergies.    Yes [provider]  fluticasone (FLONASE) 50 MCG/ACT nasal spray Place 2 sprays into both nostrils daily. Patient taking differently: Place 2 sprays into both nostrils at bedtime.  02/09/18  Yes Mikey Kirschner, MD  HYDROcodone-acetaminophen (NORCO) 5-325 MG tablet Take 1-2 tablets by mouth  every 6 (six) hours as needed for moderate pain or severe pain. Patient taking differently: Take 1 tablet by mouth 2 (two) times daily as needed for moderate pain or severe pain.  05/16/17  Yes Dancy, Estill Bamberg, PA-C  Multiple Vitamins-Minerals (WOMENS BONE HEALTH PO) Take 1 tablet by mouth daily.   Yes [provider]  Na Sulfate-K Sulfate-Mg Sulf (SUPREP BOWEL PREP KIT) 17.5-3.13-1.6 GM/177ML SOLN Take 1 kit by mouth as directed. 03/02/18  Yes Annitta Needs, NP  naproxen sodium (ALEVE) 220 MG tablet Take 220 mg by mouth daily as needed (for pain or headache).   Yes [provider]  Omega-3 1000 MG CAPS Take 1,000 mg by mouth daily.    Yes [provider]  pantoprazole (PROTONIX) 40 MG tablet Take 1 tablet (40 mg total) by mouth as needed. Patient taking differently: Take 40 mg by mouth daily as needed (for acid reflux).  02/09/18 02/28/19 Yes Mikey Kirschner, MD  triamterene-hydrochlorothiazide (MAXZIDE-25) 37.5-25 MG tablet Take 1 tablet by mouth daily. 02/09/18  Yes Mikey Kirschner, MD    Allergies as of 03/02/2018 - Review Complete 03/01/2018  Allergen Reaction Noted  . Adhesive [tape] Rash 02/22/2011    Family History  Problem Relation Age of Onset  . Heart disease Mother   . Dementia Mother   . Stroke Mother   . Congestive Heart Failure Father   . Cancer Father   . Mental illness Brother   . Early death Sister   .  Alcohol abuse Sister   . Stroke Sister   . Mental illness Brother   . Mental illness Brother   . Colon cancer Neg Hx     Social History   Socioeconomic History  . Marital status: Married    Spouse name: Not on file  . Number of children: 2  . Years of education: Not on file  . Highest education level: Not on file  Occupational History  . Occupation: Print production planner, Emergency planning/management officer    Employer: [employer]  . Financial resource strain: Not on file  . Food insecurity:    Worry: Not on file    Inability: Not on file  .  Transportation needs:    Medical: Not on file    Non-medical: Not on file  Tobacco Use  . Smoking status: Never Smoker  . Smokeless tobacco: Never Used  Substance and Sexual Activity  . Alcohol use: No  . Drug use: No  . Sexual activity: Not Currently    Birth control/protection: Surgical    Comment: hyst  Lifestyle  . Physical activity:    Days per week: Not on file    Minutes per session: Not on file  . Stress: Not on file  Relationships  . Social connections:    Talks on phone: Not on file    Gets together: Not on file    Attends religious service: Not on file    Active member of club or organization: Not on file    Attends meetings of clubs or organizations: Not on file    Relationship status: Not on file  . Intimate partner violence:    Fear of current or ex partner: Not on file    Emotionally abused: Not on file    Physically abused: Not on file    Forced sexual activity: Not on file  Other Topics Concern  . Not on file  Social History Narrative  . Not on file    Review of Systems: See HPI, otherwise negative ROS   Physical Exam: BP 120/66   Pulse 80   Temp 97.7 F (36.5 C) (Oral)   Resp 17   LMP 02/17/2011 Comment: partial   SpO2 99%  General:   Alert,  pleasant and cooperative in NAD Head:  Normocephalic and atraumatic. Neck:  Supple; Lungs:  Clear throughout to auscultation.    Heart:  Regular rate and rhythm. Abdomen:  Soft, nontender and nondistended. Normal bowel sounds, without guarding, and without rebound.   Neurologic:  Alert and  oriented x4;  grossly normal neurologically.  Impression/Plan:    SCREENING  Plan:  1. TCS TODAY DISCUSSED PROCEDURE, BENEFITS, & RISKS: < 1% chance of medication reaction, bleeding, perforation, or rupture of spleen/liver.

## 2018-03-21 MED ORDER — AMOXICILLIN 500 MG PO CAPS: 500.0000 mg | ORAL_CAPSULE | Freq: Three times a day (TID) | ORAL | 0 refills | Status: DC

## 2018-03-21 NOTE — Telephone Encounter (Signed)
Patient stated she stopped the nasal spray and would like an antibiotic sent in for sinus infection since some of the mucus has a green tint. Patient would like it called to [facility]  And will check with the pharmacy this evening.

## 2018-03-21 NOTE — Telephone Encounter (Signed)
amox 500 tid ten d 

## 2018-03-21 NOTE — Telephone Encounter (Signed)
Prescription sent electronically to pharmacy. Patient notified. 

## 2018-03-22 ENCOUNTER — Ambulatory Visit (HOSPITAL_COMMUNITY): Payer: 59 | Admitting: Physical Therapy

## 2018-03-22 DIAGNOSIS — M25561 Pain in right knee: Secondary | ICD-10-CM

## 2018-03-22 DIAGNOSIS — M25562 Pain in left knee: Secondary | ICD-10-CM | POA: Diagnosis not present

## 2018-03-22 DIAGNOSIS — R2689 Other abnormalities of gait and mobility: Secondary | ICD-10-CM

## 2018-03-22 DIAGNOSIS — G8929 Other chronic pain: Secondary | ICD-10-CM

## 2018-03-22 NOTE — Therapy (Signed)
[place], [name], [address] Phone: [phone]   Fax:  [phone]  Physical Therapy Treatment  Patient Details  Name: Jo Hale MRN: [id number] Date of Birth: 1960-06-25 Referring Provider (PT): Carole Civil, MD   Encounter Date: 03/22/2018  PT End of Session - 03/22/18 1109    Visit Number  10    Number of Visits  13    Date for PT Re-Evaluation  04/03/18    Authorization Type  [facility] (no auth required, 60 visit limit - PT/OT/SLP combined - 13 used at eval)    Authorization Time Period  02/20/2018 - 04/07/2018    Authorization - Visit Number  66    Authorization - Number of Visits  60    PT Start Time  0908    PT Stop Time  0947    PT Time Calculation (min)  39 min    Activity Tolerance  Patient tolerated treatment well    Behavior During Therapy  [facility] for tasks assessed/performed       Past Medical History:  Diagnosis Date  . Abnormal pap   . Fibroids   . Fibroids, intramural    450-600 gm uterus  . GERD (gastroesophageal reflux disease)   . Hyperlipidemia   . Hypertension   . Menopause 08/27/2014  . Neoplasm of kidney   . PONV (postoperative nausea and vomiting)   . Posterior pain of right hip 07/31/2013  . Renal cancer, right ([facility]) 05/2017  . TIA (transient ischemic attack) 2017 or 2018 unsure  . Vaginal Pap smear, abnormal     Past Surgical History:  Procedure Laterality Date  . CESAREAN SECTION    . COLONOSCOPY  02/19/2011   Fields-incomplete exam, otherwise normal   . LAPAROSCOPIC SUPRACERVICAL HYSTERECTOMY  03/02/2011   Procedure: LAPAROSCOPIC SUPRACERVICAL HYSTERECTOMY;  Surgeon: Jonnie Kind, MD;  Location: [facility];  Service: Gynecology;  Laterality: N/A;  . PARTIAL HYSTERECTOMY    . ROBOT ASSISTED LAPAROSCOPIC NEPHRECTOMY Right 05/16/2017   Procedure: XI ROBOTIC ASSISTED LAPAROSCOPIC PARTIAL NEPHRECTOMY;  Surgeon: Raynelle Bring, MD;  Location: [facility];   Service: Urology;  Laterality: Right;  . ROTATOR CUFF REPAIR     left  . TUBAL LIGATION      There were no vitals filed for this visit.  Subjective Assessment - 03/22/18 1116    Subjective  Pt states she gets her MRI Monday.  5/10 pain currently in Rt knee.     Currently in Pain?  Yes    Pain Score  5     Pain Location  Knee    Pain Orientation  Right    Pain Descriptors / Indicators  Aching;Tightness                       [facility] Adult PT Treatment/Exercise - 03/22/18 0001      Knee/Hip Exercises: Stretches   Active Hamstring Stretch  Both;3 reps;30 seconds    Active Hamstring Stretch Limitations  with 12" box    Gastroc Stretch  Both;3 reps;30 seconds    Gastroc Stretch Limitations  slant board      Knee/Hip Exercises: Machines for Strengthening   Total Gym Leg Press  [employer] leg press: 2x 20 reps bil LE, 30lbs (3 plates)      Knee/Hip Exercises: Standing   Heel Raises  Both;1 set;20 reps   toeraises 10 reps   Forward Lunges  Both;1 set;15 reps    Functional  Squat  2 sets;10 reps    Functional Squat Limitations  chair taps with mirror in front    Other Standing Knee Exercises  Side stepping with Red TB around knees, 2x 15' RT      Manual Therapy   Manual Therapy  Soft tissue mobilization    Manual therapy comments  completed separate from all other skilled interventions.    Joint Mobilization  tib/fib mob grade II    Soft tissue mobilization  Rt/Lt gastrocnemius and bilateral hamstring tendon insertions at posterior knee               PT Short Term Goals - 03/22/18 1112      PT SHORT TERM GOAL #1   Title  Patient will demonstrate understanding and report regular compliance with HEP in order to improve lower extremity strength, trunk stability, balance, and overall functional mobility.     Time  2    Period  Weeks    Status  Achieved      PT SHORT TERM GOAL #2   Title  Patient will improve bil knee ROM by 8 degrees for flexion to  demonstrate significant improvement in mobility to improve function with gait and functional activities around home.     Time  3    Period  Weeks    Status  On-going      PT SHORT TERM GOAL #3   Title  Patient will perform SLS for 15 seconds or more on bil LE's to demonstrate improved safety with stair ambulation and with ability to step in and out of bath/tub to reduce fall risk.    Time  3    Period  Weeks    Status  On-going        PT Long Term Goals - 03/22/18 1114      PT LONG TERM GOAL #1   Title  Patient will demonstrate improved MMT strength of at least 1/2 grade in all tested musculature in order to improve ease of stair navigation and overall functional mobility.     Time  6    Period  Weeks    Status  On-going      PT LONG TERM GOAL #2   Title  Patient will report no greater than a 3/10 pain in bil knees over the course of a 1 week period indicating better tolerance to daily activities.     Time  6    Period  Weeks    Status  On-going      PT LONG TERM GOAL #3   Title  Patient will improve bil hamstring flexibility by 10 degrees each to demonstrate improved flexibility to reduce posterior knee pain.    Time  6    Period  Weeks    Status  On-going      PT LONG TERM GOAL #4   Title  Patient will improve FOTO score by 12% or more to indicate significant improvement in mobility and decreased slef reproted limitation with activities related to knee pain.    Time  6    Period  Weeks    Status  On-going            Plan - 03/22/18 1159    Clinical Impression Statement  contiued focus on improivng LE strength and manual to reduce pain and restrictions.  Pt able to complete all exericses today without c/o pain.  Improving form and minimal cues required with most actvitieis.  Pt to receive MRI on  monday 12/12.  Manual revealed continued tightness bilaterally, however Rt if much worse than Lt.     Rehab Potential  Good    PT Frequency  2x / week    PT Duration  6  weeks    PT Treatment/Interventions  ADLs/Self Care Home Management;Aquatic Therapy;Cryotherapy;Electrical Stimulation;Iontophoresis 4mg /ml Dexamethasone;Moist Heat;Gait training;Stair training;Functional mobility training;Therapeutic activities;Therapeutic exercise;Balance training;Neuromuscular re-education;Patient/family education;Manual techniques;Passive range of motion;Dry needling;Energy conservation    PT Next Visit Plan  Follow up on appts with Brooke for dry needling. Assess bil hamstring tendons on bil LE's to determine if appropriate.Continue joint mobilization to bil knee joints, AP glide for flexion. Continue hip strengthening and perform soft tissue mobilization to bil hamstrings distal and proximal calves, R > L. Progress to balance and strengthening as able.  Complete 10th visit PN next visit    PT Home Exercise Plan  02/22/2018 - hamstring/calf stretches, bridge, clamshell; 03/02/2018 - supine and sidelying clams with RTB       Patient will benefit from skilled therapeutic intervention in order to improve the following deficits and impairments:  Abnormal gait, Decreased balance, Decreased endurance, Decreased mobility, Difficulty walking, Hypomobility, Obesity, Decreased range of motion, Decreased activity tolerance, Decreased strength, Impaired flexibility, Postural dysfunction, Pain  Visit Diagnosis: Chronic pain of left knee  Chronic pain of right knee  Other abnormalities of gait and mobility     Problem List Patient Active Problem List   Diagnosis Date Noted  . Special screening for malignant neoplasms, colon   . Screening for colorectal cancer 09/01/2017  . Encounter for gynecological examination with Papanicolaou smear of cervix 09/01/2017  . Neoplasm of right kidney 05/16/2017  . Superficial fungus infection of skin 08/31/2016  . Hyperlipidemia LDL goal <100 09/18/2015  . History of stroke 09/18/2015  . CVA (cerebral infarction) 07/02/2015  . Menopause  08/27/2014  . Meralgia paresthetica of left side 04/05/2014  . Venous stasis of both lower extremities 04/05/2014  . Radicular low back pain 11/07/2013  . Leg weakness, bilateral 11/07/2013  . Lumbago 10/09/2013  . Posterior pain of right hip 07/31/2013  . Esophageal reflux 01/25/2013  . Essential hypertension, benign 01/25/2013  . Rectal bleed 01/28/2012  . Constipation 01/28/2012  . Pain in joint, shoulder region 08/02/2011  . Muscle weakness (generalized) 08/02/2011  . Status post rotator cuff repair 08/02/2011  . FOOT PAIN 12/22/2009  . 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 POPLITEAL CYST, LEFT 06/30/2009  . MEDIAL MENISCUS TEAR, LEFT 06/30/2009   Teena Irani, PTA/CLT [phone]  Teena Irani 03/22/2018, 12:01 PM  [place] [facility] [address] [place], [name], [address] Phone: [phone]   Fax:  [phone]  Name: Makia Bossi MRN: [id number] Date of Birth: 04/02/1961

## 2018-03-23 ENCOUNTER — Ambulatory Visit (HOSPITAL_COMMUNITY)
Admission: RE | Admit: 2018-03-23 | Discharge: 2018-03-23 | Disposition: A | Discharge status: Home / Self Care | Payer: 59 | Source: Ambulatory Visit | Attending: Orthopedic Surgery | Admitting: Orthopedic Surgery

## 2018-03-23 ENCOUNTER — Encounter (HOSPITAL_COMMUNITY): Payer: 59 | Admitting: Physical Therapy

## 2018-03-23 DIAGNOSIS — M23203 Derangement of unspecified medial meniscus due to old tear or injury, right knee: Secondary | ICD-10-CM | POA: Insufficient documentation

## 2018-03-23 DIAGNOSIS — M25461 Effusion, right knee: Secondary | ICD-10-CM | POA: Diagnosis not present

## 2018-03-23 DIAGNOSIS — M25561 Pain in right knee: Secondary | ICD-10-CM | POA: Diagnosis not present

## 2018-03-24 ENCOUNTER — Encounter (HOSPITAL_COMMUNITY): Payer: Self-pay | Admitting: Gastroenterology

## 2018-03-27 ENCOUNTER — Ambulatory Visit (HOSPITAL_COMMUNITY): Payer: 59

## 2018-03-27 ENCOUNTER — Encounter (HOSPITAL_COMMUNITY): Payer: 59

## 2018-03-29 ENCOUNTER — Ambulatory Visit (HOSPITAL_COMMUNITY): Payer: 59

## 2018-03-29 ENCOUNTER — Telehealth (HOSPITAL_COMMUNITY): Payer: Self-pay | Admitting: Family Medicine

## 2018-03-29 NOTE — Telephone Encounter (Signed)
03/29/18  patient called at 9:18 to cx said that she had a couple more people to drop off and won't get here in time

## 2018-03-30 ENCOUNTER — Telehealth: Payer: Self-pay | Admitting: Orthopedic Surgery

## 2018-03-30 ENCOUNTER — Telehealth: Payer: Self-pay | Admitting: Radiology

## 2018-03-30 ENCOUNTER — Telehealth (HOSPITAL_COMMUNITY): Payer: Self-pay

## 2018-03-30 ENCOUNTER — Encounter (HOSPITAL_COMMUNITY): Payer: Self-pay

## 2018-03-30 NOTE — Telephone Encounter (Signed)
I called Ms. Funchess back to discuss her knee pain and swelling as she had called our office earlier today. She reports she had her MRI done this Monday and she is going to see Dr. Aline Brochure on 04/14/2018 for a follow up and he instructed her to stop therapy a this time until they discuss her results. I will place her on hold as requested by the patient at this time. I asked that she call our office back after her appointment with Dr. Aline Brochure to let us know if she would like to resume therapy at that time or if she has a change in plans/status.  Kipp Brood, PT, DPT Physical Therapist with [facility]  03/30/2018 3:40 PM

## 2018-03-30 NOTE — Telephone Encounter (Signed)
Patient called in, I have made her an appointment to review MRI scan with Dr Aline Brochure on Jan 3, since Dr Aline Brochure is out of the office next week   She states physical therapy is making her knee swell. I have told her to do the Straight leg raises, and flexion, extension of knee only at home, and hold off on further therapy at this point  To you FYI, unless you recommend something else.

## 2018-03-30 NOTE — Telephone Encounter (Signed)
See other message

## 2018-03-30 NOTE — Telephone Encounter (Signed)
Patient called and wanted to know MRI results.  She also wants to know if she can resume PT.  Please call her to advise  Thanks

## 2018-03-30 NOTE — Telephone Encounter (Signed)
Called patient to make a follow up

## 2018-03-31 ENCOUNTER — Ambulatory Visit (HOSPITAL_COMMUNITY): Payer: 59

## 2018-04-03 ENCOUNTER — Ambulatory Visit (HOSPITAL_COMMUNITY): Payer: 59

## 2018-04-06 ENCOUNTER — Ambulatory Visit (HOSPITAL_COMMUNITY): Payer: 59

## 2018-04-10 ENCOUNTER — Ambulatory Visit (HOSPITAL_COMMUNITY): Payer: 59

## 2018-04-13 ENCOUNTER — Encounter (HOSPITAL_COMMUNITY): Payer: 59

## 2018-04-14 ENCOUNTER — Ambulatory Visit: Payer: BLUE CROSS/BLUE SHIELD | Admitting: Orthopedic Surgery

## 2018-04-14 ENCOUNTER — Encounter: Payer: Self-pay | Admitting: Orthopedic Surgery

## 2018-04-14 VITALS — BP 130/81 | HR 86 | Ht 62.0 in | Wt 242.0 lb

## 2018-04-14 DIAGNOSIS — M23203 Derangement of unspecified medial meniscus due to old tear or injury, right knee: Secondary | ICD-10-CM

## 2018-04-14 DIAGNOSIS — M25561 Pain in right knee: Secondary | ICD-10-CM | POA: Diagnosis not present

## 2018-04-14 DIAGNOSIS — Z6841 Body Mass Index (BMI) 40.0 and over, adult: Secondary | ICD-10-CM | POA: Diagnosis not present

## 2018-04-14 DIAGNOSIS — M25562 Pain in left knee: Secondary | ICD-10-CM

## 2018-04-14 DIAGNOSIS — G8929 Other chronic pain: Secondary | ICD-10-CM

## 2018-04-14 NOTE — Progress Notes (Signed)
Chief Complaint  Patient presents with  . 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 Alternative treatment has also been reviewed, questions were encouraged and answered. The postoperative plan is also been reviewed.  RIGHT KNEE arthroscopy partial medial meniscectomy

## 2018-04-14 NOTE — Patient Instructions (Signed)
SURGERY DATE PENDING   Meniscus Injury, Arthroscopy   Arthroscopy is a surgical procedure that involves the use of a small scope that has a camera and surgical instruments on the end (arthroscope). An arthroscope can be used to repair your meniscus injury.  LET [facility] CARE PROVIDER KNOW ABOUT:  Any allergies you have.  All medicines you are taking, including vitamins, herbs, eyedrops, creams, and over-the-counter medicines.  Any recent colds or infections you have had or currently have.  Previous problems you or members of your family have had with the use of anesthetics.  Any blood disorders or blood clotting problems you have.  Previous surgeries you have had.  Medical conditions you have. RISKS AND COMPLICATIONS Generally, this is a safe procedure. However, as with any procedure, problems can occur. Possible problems include:  Damage to nerves or blood vessels.  Excess bleeding.  Blood clots.  Infection. BEFORE THE PROCEDURE  Do not eat or drink for 6-8 hours before the procedure.  Take medicines as directed by your surgeon. Ask your surgeon about changing or stopping your regular medicines.  You may have lab tests the morning of surgery. PROCEDURE  You will be given one of the following:   A medicine that numbs the area (local anesthesia).  A medicine that makes you go to sleep (general anesthesia).  A medicine injected into your spine that numbs your body below the waist (spinal anesthesia). Most often, several small cuts (incisions) are made in the knee. The arthroscope and instruments go into the incisions to repair the damage. The torn portion of the meniscus is removed.   AFTER THE PROCEDURE  You will be taken to the recovery area where your progress will be monitored. When you are awake, stable, and taking fluids without complications, you will be allowed to go home. This is usually the same day. A torn or stretched ligament (ligament sprain) may take  6-8 weeks to heal.   It takes about the 4-6 WEEKS if your surgeon removed a torn meniscus.  A repaired meniscus may require 6-12 weeks of recovery time.  A torn ligament needing reconstructive surgery may take 6-12 months to heal fully.   This information is not intended to replace advice given to you by your health care provider. Make sure you discuss any questions you have with your health care provider. You have decided to proceed with operative arthroscopy of the knee. You have decided not to continue with nonoperative measures such as but not limited to oral medication, weight loss, activity modification, physical therapy, bracing, or injection.  We will perform operative arthroscopy of the knee. Some of the risks associated with arthroscopic surgery of the knee include but are not limited to Bleeding Infection Swelling Stiffness Blood clot Pain Need for knee replacement surgery    In compliance with recent [place] law in federal regulation regarding opioid use and abuse and addiction, we will taper (stop) opioid medication after 2 weeks.  If you're not comfortable with these risks and would like to continue with nonoperative treatment please let Dr. Aline Brochure know prior to your surgery.

## 2018-04-17 ENCOUNTER — Telehealth: Payer: Self-pay | Admitting: Orthopedic Surgery

## 2018-04-17 ENCOUNTER — Other Ambulatory Visit: Payer: Self-pay | Admitting: Orthopedic Surgery

## 2018-04-17 NOTE — Telephone Encounter (Signed)
No can do   Try heating pad and ibuprofen   Final answer

## 2018-04-17 NOTE — Telephone Encounter (Signed)
Obera called and stated that she wants/need something for her knee pain.  She is scheduled to have surgery on 04/27/18.  She states she took 1 tablet of Hydrocodone last night and this did not help her pain at all.   She wants Dr. Aline Brochure to give her something for pain but something stronger than Hydrocodone.  She states she uses [facility]

## 2018-04-17 NOTE — Telephone Encounter (Signed)
Patient left message on voicemail stating to go ahead and schedule her for the 16th and wanted to know what the down payment would be.  Please call and advise [phone].

## 2018-04-17 NOTE — Telephone Encounter (Signed)
Called to advise her can post on the 16th, she will get a call from [facility] for her Preop appt with anesthesia  Advised her to discuss with her insurance company and pre service center about the out of pocket expensed from the surgery.

## 2018-04-18 NOTE — Telephone Encounter (Signed)
Left message for her to call me back. 

## 2018-04-18 NOTE — Telephone Encounter (Signed)
Discussed with patient

## 2018-04-21 NOTE — Patient Instructions (Signed)
Jo Hale  04/21/2018     @PREFPERIOPPHARMACY @   Your procedure is scheduled on  04/27/2018  Report to [facility] at  12   A.M.  Call this number if you have problems the morning of surgery:  [phone]   Remember:  Do not eat or drink after midnight.                       Take these medicines the morning of surgery with A SIP OF WATER  Hydrocodone ( if needed), protonix.    Do not wear jewelry, make-up or nail polish.  Do not wear lotions, powders, or perfumes, or deodorant.  Do not shave 48 hours prior to surgery.  Men may shave face and neck.  Do not bring valuables to the hospital.  [facility] is not responsible for any belongings or valuables.  Contacts, dentures or bridgework may not be worn into surgery.  Leave your suitcase in the car.  After surgery it may be brought to your room.  For patients admitted to the hospital, discharge time will be determined by your treatment team.  Patients discharged the day of surgery will not be allowed to drive home.   Name and phone number of your driver:   family Special instructions:  None  Please read over the following fact sheets that you were given. Anesthesia Post-op Instructions and Care and Recovery After Surgery       Arthroscopic Knee Ligament Repair  Arthroscopic knee ligament repair is a procedure to repair a tear in one or more of the tough, cord-like tissues that connect bones (ligaments) in the knee. This procedure is done by placing a thin tube with a light and camera on the end (arthroscope) through a small incision in the knee. The arthroscope sends images to a monitor in the operating room, and the images are used to help perform the surgery. This procedure is less invasive than open knee surgery. Tell a health care provider about:  Any allergies you have.  All medicines you are taking, including vitamins, herbs, eye drops, creams, and over-the-counter medicines.  Any problems  you or family members have had with anesthetic medicines.  Any blood disorders you have.  Any surgeries you have had.  Any medical conditions you have.  Whether you are pregnant or may be pregnant. What are the risks? Generally, this is a safe procedure. However, problems may occur, including:  Infection.  Bleeding or blood clot.  Allergic reactions to medicines or dyes.  Damage to blood vessels, nerves, or other structures of the knee.  Stiffness.  Failure to relieve symptoms.  Buildup of pressure and swelling in spaces in the leg (compartment syndrome). This is rare. What happens before the procedure? Medicines Ask your health care provider about:  Changing or stopping your regular medicines. This is especially important if you are taking diabetes medicines or blood thinners.  Taking medicines such as aspirin and ibuprofen. These medicines can thin your blood. Do not take these medicines before your procedure if your health care provider instructs you not to. Staying hydrated Follow instructions from your health care provider about hydration, which may include:  Up to 2 hours before the procedure - you may continue to drink clear liquids, such as water, clear fruit juice, black coffee, and plain tea. Eating and drinking restrictions Follow instructions from your health care provider about eating and drinking, which may  include:  8 hours before the procedure - stop eating heavy meals or foods such as meat, fried foods, or fatty foods.  6 hours before the procedure - stop eating light meals or foods, such as toast or cereal.  6 hours before the procedure - stop drinking milk or drinks that contain milk.  2 hours before the procedure - stop drinking clear liquids. General instructions  You may have a physical exam of your knee.  You may have some imaging tests of your knee, such as X-rays or MRI.  Do not use any products that contain nicotine or tobacco for as long  as directed before your procedure. This includes cigarettes and e-cigarettes. If you need help quitting, ask your health care provider.  You may be asked to shower with a germ-killing soap.  Ask your health care provider how your surgical site will be marked or identified.  Plan to have someone take you home from the hospital or clinic.  If you will be going home right after the procedure, plan to have someone with you for 24 hours. What happens during the procedure?  To lower your risk of infection: ? Your health care team will wash or sanitize their hands. ? Your skin will be washed with soap.  Small monitors will be put on your body. They are used to check your heart, blood pressure, and oxygen levels.  An IV tube will be inserted into one of your veins.  You may be given one or more of the following: ? A medicine to help you relax (sedative). ? A medicine to make you fall asleep (general anesthetic).  A breathing tube will be placed down your throat and into your lungs to help you breathe during the procedure.  A cuff may be placed around your upper leg to slow bleeding during the procedure.  Several small incisions will be made in your knee.  Your knee joint will be flushed and filled with a germ-free saltwater solution (sterile saline). This expands the knee joint and clears any blood, which lets your surgeon see your knee more clearly.  An arthroscope will be inserted through one of the incisions to examine your knee.  Surgical instruments will be inserted through the other incisions to repair injured tissue. Injured tissue will be stitched (sutured) together, and in some cases tissue may be removed.  Sterile saline will be removed from your knee.  Your incisions will be closed with absorbable sutures and covered with bandages (dressings).  Your knee may be placed in a brace or immobilizer at the end of the procedure or right after the procedure. The procedure may vary  among health care providers and hospitals. What happens after the procedure?  Your blood pressure, heart rate, breathing rate and blood oxygen level will be monitored until the medicines you were given have worn off.  You may be given medicine for pain.  You may get crutches to help you walk without supporting your body weight on your knee.  You may have to wear compression stockings. These stockings help to prevent blood clots and reduce swelling in your legs.  Do not drive until your health care provider approves.  You will work with a physical therapist to determine the best course of rehab (rehabilitation) for you. Rehab is very important after this procedure. Summary  Arthroscopic knee ligament repair is a procedure to repair a tear in one or more of the tough, cord-like tissues that connect bones (ligaments) in the knee.  This procedure is less invasive than open knee surgery.  Plan to have someone take you home from the hospital or clinic.  Do not use any products that contain nicotine or tobacco for as long as directed before your procedure. This includes cigarettes and e-cigarettes. If you need help quitting, ask your health care provider.  You will work with a physical therapist to determine the best course of rehab (rehabilitation) for you. Rehab is very important after this procedure. This information is not intended to replace advice given to you by your health care provider. Make sure you discuss any questions you have with your health care provider. Document Released: 03/26/2000 Document Revised: 05/06/2016 Document Reviewed: 05/06/2016 Elsevier Interactive Patient Education  2019 [place].  Arthroscopic Knee Ligament Repair, Care After This sheet gives you information about how to care for yourself after your procedure. Your health care provider may also give you more specific instructions. If you have problems or questions, contact your health care provider. What can  I expect after the procedure? After the procedure, it is common to have:  Pain in your knee.  Bruising and swelling on your knee, calf, and ankle for 3-4 days.  Fatigue. Follow these instructions at home: If you have a brace or immobilizer:  Wear the brace or immobilizer as told by your health care provider. Remove it only as told by your health care provider.  Loosen the splint or immobilizer if your toes tingle, become numb, or turn cold and blue.  Keep the brace or immobilizer clean. Bathing  Do not take baths, swim, or use a hot tub until your health care provider approves. Ask your health care provider if you can take showers.  Keep your bandage (dressing) dry until your health care provider says that it can be removed. Cover it and your brace or immobilizer with a watertight covering when you take a shower. Incision care   Follow instructions from your health care provider about how to take care of your incision. Make sure you: ? Wash your hands with soap and water before you change your bandage (dressing). If soap and water are not available, use hand sanitizer. ? Change your dressing as told by your health care provider. ? Leave stitches (sutures), skin glue, or adhesive strips in place. These skin closures may need to stay in place for 2 weeks or longer. If adhesive strip edges start to loosen and curl up, you may trim the loose edges. Do not remove adhesive strips completely unless your health care provider tells you to do that.  Check your incision area every day for signs of infection. Check for: ? More redness, swelling, or pain. ? More fluid or blood. ? Warmth. ? Pus or a bad smell. Managing pain, stiffness, and swelling   If directed, put ice on the affected area. ? If you have a removable brace or immobilizer, remove it as told by your health care provider. ? Put ice in a plastic bag. ? Place a towel between your skin and the bag or between your brace or  immobilizer and the bag. ? Leave the ice on for 20 minutes, 2-3 times a day.  Move your toes often to avoid stiffness and to lessen swelling.  Raise (elevate) the injured area above the level of your heart while you are sitting or lying down. Driving  Do not drive until your health care provider approves. If you have a brace or immobilizer on your leg, ask your health care  provider when it is safe for you to drive.  Do not drive or use heavy machinery while taking prescription pain medicine. Activity  Rest as directed. Ask your health care provider what activities are safe for you.  Do physical therapy exercises as told by your health care provider. Physical therapy will help you regain strength and motion in your knee.  Follow instructions from your health care provider about: ? When you may start motion exercises. ? When you may start riding a stationary bike and doing other low-impact activities. ? When you may start to jog and do other high-impact activities. Safety  Do not use the injured limb to support your body weight until your health care provider says that you can. Use crutches as told by your health care provider. General instructions  Do not use any products that contain nicotine or tobacco, such as cigarettes and e-cigarettes. These can delay bone healing. If you need help quitting, ask your health care provider.  To prevent or treat constipation while you are taking prescription pain medicine, your health care provider may recommend that you: ? Drink enough fluid to keep your urine clear or pale yellow. ? Take over-the-counter or prescription medicines. ? Eat foods that are high in fiber, such as fresh fruits and vegetables, whole grains, and beans. ? Limit foods that are high in fat and processed sugars, such as fried and sweet foods.  Take over-the-counter and prescription medicines only as told by your health care provider.  Keep all follow-up visits as told by  your health care provider. This is important. Contact a health care provider if:  You have more redness, swelling, or pain around an incision.  You have more fluid or blood coming from an incision.  Your incision feels warm to the touch.  You have a fever.  You have pain or swelling in your knee, and it gets worse.  You have pain that does not get better with medicine. Get help right away if:  You have trouble breathing.  You have pus or a bad smell coming from an incision.  You have numbness and tingling near the knee joint. Summary  After the procedure, it is common to have knee pain with bruising and swelling on your knee, calf, and ankle.  Icing your knee and raising your leg above the level of your heart will help control the pain and the swelling.  Do physical therapy exercises as told by your health care provider. Physical therapy will help you regain strength and motion in your knee. This information is not intended to replace advice given to you by your health care provider. Make sure you discuss any questions you have with your health care provider. Document Released: 01/17/2013 Document Revised: 03/23/2016 Document Reviewed: 03/23/2016 Elsevier Interactive Patient Education  2019 [place] Anesthesia, Adult General anesthesia is the use of medicines to make a person "go to sleep" (unconscious) for a medical procedure. General anesthesia must be used for certain procedures, and is often recommended for procedures that:  Last a long time.  Require you to be still or in an unusual position.  Are major and can cause blood loss. The medicines used for general anesthesia are called general anesthetics. As well as making you unconscious for a certain amount of time, these medicines:  Prevent pain.  Control your blood pressure.  Relax your muscles. Tell a health care provider about:  Any allergies you have.  All medicines you are taking, including  vitamins, herbs, eye drops, creams, and over-the-counter medicines.  Any problems you or family members have had with anesthetic medicines.  Types of anesthetics you have had in the past.  Any blood disorders you have.  Any surgeries you have had.  Any medical conditions you have.  Any recent upper respiratory, chest, or ear infections.  Any history of: ? Heart or lung conditions, such as heart failure, sleep apnea, asthma, or chronic obstructive pulmonary disease (COPD). ? [employer] forces logistics/support/administrative officer. ? Depression or anxiety.  Any tobacco or drug use, including marijuana or alcohol use.  Whether you are pregnant or may be pregnant. What are the risks? Generally, this is a safe procedure. However, problems may occur, including:  Allergic reaction.  Lung and heart problems.  Inhaling food or liquid from the stomach into the lungs (aspiration).  Nerve injury.  Dental injury.  Air in the bloodstream, which can lead to stroke.  Extreme agitation or confusion (delirium) when you wake up from the anesthetic.  Waking up during your procedure and being unable to move. This is rare. These problems are more likely to develop if you are having a major surgery or if you have an advanced or serious medical condition. You can prevent some of these complications by answering all of your health care provider's questions thoroughly and by following all instructions before your procedure. General anesthesia can cause side effects, including:  Nausea or vomiting.  A sore throat from the breathing tube.  Hoarseness.  Wheezing or coughing.  Shaking chills.  Tiredness.  Body aches.  Anxiety.  Sleepiness or drowsiness.  Confusion or agitation. What happens before the procedure? Staying hydrated Follow instructions from your health care provider about hydration, which may include:  Up to 2 hours before the procedure - you may continue to drink clear liquids, such as water, clear fruit  juice, black coffee, and plain tea.  Eating and drinking restrictions Follow instructions from your health care provider about eating and drinking, which may include:  8 hours before the procedure - stop eating heavy meals or foods such as meat, fried foods, or fatty foods.  6 hours before the procedure - stop eating light meals or foods, such as toast or cereal.  6 hours before the procedure - stop drinking milk or drinks that contain milk.  2 hours before the procedure - stop drinking clear liquids. Medicines Ask your health care provider about:  Changing or stopping your regular medicines. This is especially important if you are taking diabetes medicines or blood thinners.  Taking medicines such as aspirin and ibuprofen. These medicines can thin your blood. Do not take these medicines unless your health care provider tells you to take them.  Taking over-the-counter medicines, vitamins, herbs, and supplements. Do not take these during the week before your procedure unless your health care provider approves them. General instructions  Starting 3-6 weeks before the procedure, do not use any products that contain nicotine or tobacco, such as cigarettes and e-cigarettes. If you need help quitting, ask your health care provider.  If you brush your teeth on the morning of the procedure, make sure to spit out all of the toothpaste.  Tell your health care provider if you become ill or develop a cold, cough, or fever.  If instructed by your health care provider, bring your sleep apnea device with you on the day of your surgery (if applicable).  Ask your health care provider if you will be going home the same day, the following  day, or after a longer hospital stay. ? Plan to have someone take you home from the hospital or clinic. ? Plan to have a responsible adult care for you for at least 24 hours after you leave the hospital or clinic. This is important. What happens during the  procedure?   You will be given anesthetics through both of the following: ? A mask placed over your nose and mouth. ? An IV in one of your veins.  You may receive a medicine to help you relax (sedative).  After you are unconscious, a breathing tube may be inserted down your throat to help you breathe. This will be removed before you wake up.  An anesthesia specialist will stay with you throughout your procedure. He or she will: ? Keep you comfortable and safe by continuing to give you medicines and adjusting the amount of medicine that you get. ? Monitor your blood pressure, pulse, and oxygen levels to make sure that the anesthetics do not cause any problems. The procedure may vary among health care providers and hospitals. What happens after the procedure?  Your blood pressure, temperature, heart rate, breathing rate, and blood oxygen level will be monitored until the medicines you were given have worn off.  You will wake up in a recovery area. You may wake up slowly.  If you feel anxious or agitated, you may be given medicine to help you calm down.  If you will be going home the same day, your health care provider may check to make sure you can walk, drink, and urinate.  Your health care provider will treat any pain or side effects you have before you go home.  Do not drive for 24 hours if you were given a sedative. Summary  General anesthesia is used to keep you still and prevent pain during a procedure.  It is important to tell your health care provider about your medical history and any surgeries you have had, and previous experience with anesthesia.  Follow your health care provider's instructions about when to stop eating, drinking, or taking certain medicines before your procedure.  Plan to have someone take you home from the hospital or clinic. This information is not intended to replace advice given to you by your health care provider. Make sure you discuss any  questions you have with your health care provider. Document Released: 07/06/2007 Document Revised: 08/16/2017 Document Reviewed: 11/12/2016 Elsevier Interactive Patient Education  2019 [place] Anesthesia, Adult, Care After This sheet gives you information about how to care for yourself after your procedure. Your health care provider may also give you more specific instructions. If you have problems or questions, contact your health care provider. What can I expect after the procedure? After the procedure, the following side effects are common:  Pain or discomfort at the IV site.  Nausea.  Vomiting.  Sore throat.  Trouble concentrating.  Feeling cold or chills.  Weak or tired.  Sleepiness and fatigue.  Soreness and body aches. These side effects can affect parts of the body that were not involved in surgery. Follow these instructions at home:  For at least 24 hours after the procedure:  Have a responsible adult stay with you. It is important to have someone help care for you until you are awake and alert.  Rest as needed.  Do not: ? Participate in activities in which you could fall or become injured. ? Drive. ? Use heavy machinery. ? Drink alcohol. ? Take sleeping pills or  medicines that cause drowsiness. ? Make important decisions or sign legal documents. ? Take care of children on your own. Eating and drinking  Follow any instructions from your health care provider about eating or drinking restrictions.  When you feel hungry, start by eating small amounts of foods that are soft and easy to digest (bland), such as toast. Gradually return to your regular diet.  Drink enough fluid to keep your urine pale yellow.  If you vomit, rehydrate by drinking water, juice, or clear broth. General instructions  If you have sleep apnea, surgery and certain medicines can increase your risk for breathing problems. Follow instructions from your health care provider  about wearing your sleep device: ? Anytime you are sleeping, including during daytime naps. ? While taking prescription pain medicines, sleeping medicines, or medicines that make you drowsy.  Return to your normal activities as told by your health care provider. Ask your health care provider what activities are safe for you.  Take over-the-counter and prescription medicines only as told by your health care provider.  If you smoke, do not smoke without supervision.  Keep all follow-up visits as told by your health care provider. This is important. Contact a health care provider if:  You have nausea or vomiting that does not get better with medicine.  You cannot eat or drink without vomiting.  You have pain that does not get better with medicine.  You are unable to pass urine.  You develop a skin rash.  You have a fever.  You have redness around your IV site that gets worse. Get help right away if:  You have difficulty breathing.  You have chest pain.  You have blood in your urine or stool, or you vomit blood. Summary  After the procedure, it is common to have a sore throat or nausea. It is also common to feel tired.  Have a responsible adult stay with you for the first 24 hours after general anesthesia. It is important to have someone help care for you until you are awake and alert.  When you feel hungry, start by eating small amounts of foods that are soft and easy to digest (bland), such as toast. Gradually return to your regular diet.  Drink enough fluid to keep your urine pale yellow.  Return to your normal activities as told by your health care provider. Ask your health care provider what activities are safe for you. This information is not intended to replace advice given to you by your health care provider. Make sure you discuss any questions you have with your health care provider. Document Released: 07/05/2000 Document Revised: 11/12/2016 Document Reviewed:  11/12/2016 Elsevier Interactive Patient Education  2019 [employer].

## 2018-04-24 ENCOUNTER — Encounter (HOSPITAL_COMMUNITY)
Admission: RE | Admit: 2018-04-24 | Discharge: 2018-04-24 | Disposition: A | Discharge status: Home / Self Care | Payer: BLUE CROSS/BLUE SHIELD | Source: Ambulatory Visit | Attending: Orthopedic Surgery | Admitting: Orthopedic Surgery

## 2018-04-24 ENCOUNTER — Encounter (HOSPITAL_COMMUNITY): Payer: Self-pay

## 2018-04-24 ENCOUNTER — Other Ambulatory Visit: Payer: Self-pay

## 2018-04-24 ENCOUNTER — Ambulatory Visit: Payer: BLUE CROSS/BLUE SHIELD | Admitting: Family Medicine

## 2018-04-24 ENCOUNTER — Encounter: Payer: Self-pay | Admitting: Family Medicine

## 2018-04-24 VITALS — BP 140/78 | Ht 62.0 in | Wt 245.0 lb

## 2018-04-24 DIAGNOSIS — Z01812 Encounter for preprocedural laboratory examination: Secondary | ICD-10-CM | POA: Insufficient documentation

## 2018-04-24 DIAGNOSIS — J069 Acute upper respiratory infection, unspecified: Secondary | ICD-10-CM

## 2018-04-24 DIAGNOSIS — J029 Acute pharyngitis, unspecified: Secondary | ICD-10-CM

## 2018-04-24 DIAGNOSIS — B9789 Other viral agents as the cause of diseases classified elsewhere: Secondary | ICD-10-CM | POA: Diagnosis not present

## 2018-04-24 LAB — CBC WITH DIFFERENTIAL/PLATELET
ABS IMMATURE GRANULOCYTES: 0.01 10*3/uL (ref 0.00–0.07)
Basophils Absolute: 0 10*3/uL (ref 0.0–0.1)
Basophils Relative: 1 %
Eosinophils Absolute: 0.2 10*3/uL (ref 0.0–0.5)
Eosinophils Relative: 3 %
HCT: 39.7 % (ref 36.0–46.0)
HEMOGLOBIN: 12.7 g/dL (ref 12.0–15.0)
Immature Granulocytes: 0 %
LYMPHS ABS: 2 10*3/uL (ref 0.7–4.0)
Lymphocytes Relative: 40 %
MCH: 30.5 pg (ref 26.0–34.0)
MCHC: 32 g/dL (ref 30.0–36.0)
MCV: 95.4 fL (ref 80.0–100.0)
MONOS PCT: 9 %
Monocytes Absolute: 0.4 10*3/uL (ref 0.1–1.0)
Neutro Abs: 2.4 10*3/uL (ref 1.7–7.7)
Neutrophils Relative %: 47 %
Platelets: 237 10*3/uL (ref 150–400)
RBC: 4.16 MIL/uL (ref 3.87–5.11)
RDW: 13.8 % (ref 11.5–15.5)
WBC: 5.1 10*3/uL (ref 4.0–10.5)
nRBC: 0 % (ref 0.0–0.2)

## 2018-04-24 LAB — BASIC METABOLIC PANEL
Anion gap: 9 (ref 5–15)
BUN: 17 mg/dL (ref 6–20)
CHLORIDE: 106 mmol/L (ref 98–111)
CO2: 25 mmol/L (ref 22–32)
Calcium: 8.5 mg/dL — ABNORMAL LOW (ref 8.9–10.3)
Creatinine, Ser: 1.22 mg/dL — ABNORMAL HIGH (ref 0.44–1.00)
GFR calc Af Amer: 57 mL/min — ABNORMAL LOW (ref >60)
GFR calc non Af Amer: 49 mL/min — ABNORMAL LOW (ref >60)
Glucose, Bld: 89 mg/dL (ref 70–99)
Potassium: 3.3 mmol/L — ABNORMAL LOW (ref 3.5–5.1)
Sodium: 140 mmol/L (ref 135–145)

## 2018-04-24 LAB — POCT RAPID STREP A (OFFICE): Rapid Strep A Screen: NEGATIVE

## 2018-04-24 MED ORDER — BENZONATATE 100 MG PO CAPS: 100.0000 mg | ORAL_CAPSULE | Freq: Three times a day (TID) | ORAL | 0 refills | Status: DC | PRN

## 2018-04-24 NOTE — Progress Notes (Signed)
   Subjective:    Patient ID: Jo Hale, female    DOB: [birth year], 58 y.o.   MRN: [id number]  HPI Patient is here today with complaints of a cough and a sore throat ongoing for the last three days. She has been taking benadryl and mucinex, otc cough medication.  3-4 days ago started with sore throat, then cough started - productive of a little phlegm now a little green in color. Right ear pain initially but not now. Denies fever, bodyaches or h/a. Denies N/V/D. Denies coughing up blood or night sweats.   Pt works at [employer] and states a lot of residents have been coughing, also husband has been sick recently with similar symptoms.   Review of Systems  Constitutional: Negative for chills and fever.  HENT: Positive for sore throat. Negative for congestion, ear pain, sinus pressure and sinus pain.   Respiratory: Positive for cough. Negative for shortness of breath and wheezing.   Gastrointestinal: Negative for diarrhea, nausea and vomiting.  Neurological: Negative for headaches.       Objective:   Physical Exam Vitals signs and nursing note reviewed.  Constitutional:      General: She is not in acute distress.    Appearance: She is well-developed. She is not toxic-appearing.  HENT:     Head: Normocephalic and atraumatic.     Right Ear: Tympanic membrane normal.     Left Ear: Tympanic membrane normal.     Nose: Nose normal.     Mouth/Throat:     Mouth: Mucous membranes are moist.     Pharynx: Oropharynx is clear.  Eyes:     General:        Right eye: No discharge.        Left eye: No discharge.  Neck:     Musculoskeletal: Neck supple. No neck rigidity.  Cardiovascular:     Rate and Rhythm: Normal rate and regular rhythm.     Heart sounds: Normal heart sounds.  Pulmonary:     Effort: Pulmonary effort is normal. No respiratory distress.     Breath sounds: Normal breath sounds.  Lymphadenopathy:     Cervical: No cervical adenopathy.  Skin:    General: Skin  is warm and dry.  Neurological:     Mental Status: She is alert and oriented to person, place, and time.           Assessment & Plan:  Viral URI with cough  Sore throat - Plan: POCT rapid strep A, Strep A DNA probe  Discussed likely viral etiology at this time, antibiotics not warranted. Do not feel this is the flu. Benzonatate prn for cough. Symptomatic care discussed, warning signs discussed. F/u if symptoms worsen or fail to improve.

## 2018-04-24 NOTE — Patient Instructions (Signed)
Viral Respiratory Infection  A viral respiratory infection is an illness that affects parts of the body that are used for breathing. These include the lungs, nose, and throat. It is caused by a germ called a virus.  Some examples of this kind of infection are:  · A cold.  · The flu (influenza).  · A respiratory syncytial virus (RSV) infection.  A person who gets this illness may have the following symptoms:  · A stuffy or runny nose.  · Yellow or green fluid in the nose.  · A cough.  · Sneezing.  · Tiredness (fatigue).  · Achy muscles.  · A sore throat.  · Sweating or chills.  · A fever.  · A headache.  Follow these instructions at home:  Managing pain and congestion  · Take over-the-counter and prescription medicines only as told by your doctor.  · If you have a sore throat, gargle with salt water. Do this 3-4 times per day or as needed. To make a salt-water mixture, dissolve ½-1 tsp of salt in 1 cup of warm water. Make sure that all the salt dissolves.  · Use nose drops made from salt water. This helps with stuffiness (congestion). It also helps soften the skin around your nose.  · Drink enough fluid to keep your pee (urine) pale yellow.  General instructions    · Rest as much as possible.  · Do not drink alcohol.  · Do not use any products that have nicotine or tobacco, such as cigarettes and e-cigarettes. If you need help quitting, ask your doctor.  · Keep all follow-up visits as told by your doctor. This is important.  How is this prevented?    · Get a flu shot every year. Ask your doctor when you should get your flu shot.  · Do not let other people get your germs. If you are sick:  ? Stay home from work or school.  ? Wash your hands with soap and water often. Wash your hands after you cough or sneeze. If soap and water are not available, use hand sanitizer.  · Avoid contact with people who are sick during cold and flu season. This is in fall and winter.  Get help if:  · Your symptoms last for 10 days or  longer.  · Your symptoms get worse over time.  · You have a fever.  · You have very bad pain in your face or forehead.  · Parts of your jaw or neck become very swollen.  Get help right away if:  · You feel pain or pressure in your chest.  · You have shortness of breath.  · You faint or feel like you will faint.  · You keep throwing up (vomiting).  · You feel confused.  Summary  · A viral respiratory infection is an illness that affects parts of the body that are used for breathing.  · Examples of this illness include a cold, the flu, and respiratory syncytial virus (RSV) infection.  · The infection can cause a runny nose, cough, sneezing, sore throat, and fever.  · Follow what your doctor tells you about taking medicines, drinking lots of fluid, washing your hands, resting at home, and avoiding people who are sick.  This information is not intended to replace advice given to you by your health care provider. Make sure you discuss any questions you have with your health care provider.  Document Released: 03/11/2008 Document Revised: 05/09/2017 Document Reviewed: 05/09/2017  Elsevier   Interactive Patient Education © 2019 Elsevier Inc.

## 2018-04-25 LAB — STREP A DNA PROBE: STREP GP A DIRECT, DNA PROBE: NEGATIVE

## 2018-04-25 NOTE — H&P (Signed)
Jo Hale is an 58 y.o. female.   Chief Complaint: Pain right knee HPI: 58 year old female presents for surgery on her right knee.  She is scheduled for arthroscopy right knee and partial medial meniscectomy  The patient is 58 years old she actually had bilateral knee pain we are addressing her right knee first after nonoperative treatment which included attempts at weight loss physical therapy anti-inflammatory medication and oral opioids she did not improve complains of medial pain of over 3 months duration described as a dull ache she describes severe pain and its associated with trouble walking bending and straightening the knee.  Past Medical History:  Diagnosis Date  . Abnormal pap   . Fibroids   . Fibroids, intramural    450-600 gm uterus  . GERD (gastroesophageal reflux disease)   . Hyperlipidemia   . Hypertension   . Menopause 08/27/2014  . Neoplasm of kidney   . PONV (postoperative nausea and vomiting)   . Posterior pain of right hip 07/31/2013  . Renal cancer, right ([place]) 05/2017  . TIA (transient ischemic attack) 2017 or 2018 unsure  . Vaginal Pap smear, abnormal     Past Surgical History:  Procedure Laterality Date  . CESAREAN SECTION    . COLONOSCOPY  02/19/2011   Fields-incomplete exam, otherwise normal   . COLONOSCOPY N/A 03/20/2018   Procedure: COLONOSCOPY;  Surgeon: Danie Binder, MD;  Location: [facility];  Service: Endoscopy;  Laterality: N/A;  9:30  . LAPAROSCOPIC SUPRACERVICAL HYSTERECTOMY  03/02/2011   Procedure: LAPAROSCOPIC SUPRACERVICAL HYSTERECTOMY;  Surgeon: Jonnie Kind, MD;  Location: [facility];  Service: Gynecology;  Laterality: N/A;  . PARTIAL HYSTERECTOMY    . ROBOT ASSISTED LAPAROSCOPIC NEPHRECTOMY Right 05/16/2017   Procedure: XI ROBOTIC ASSISTED LAPAROSCOPIC PARTIAL NEPHRECTOMY;  Surgeon: Raynelle Bring, MD;  Location: [facility];  Service: Urology;  Laterality: Right;  . ROTATOR CUFF REPAIR     left  . TUBAL LIGATION      Family  History  Problem Relation Age of Onset  . Heart disease Mother   . Dementia Mother   . Stroke Mother   . Congestive Heart Failure Father   . Cancer Father   . Mental illness Brother   . Early death Sister   . Alcohol abuse Sister   . Stroke Sister   . Mental illness Brother   . Mental illness Brother   . Colon cancer Neg Hx    Social History:  reports that she has never smoked. She has never used smokeless tobacco. She reports that she does not drink alcohol or use drugs.  Allergies:  Allergies  Allergen Reactions  . Adhesive [Tape] Rash    No medications prior to admission.    Results for orders placed or performed during the hospital encounter of 04/24/18 (from the past 48 hour(s))  Basic metabolic panel     Status: Abnormal   Collection Time: 04/24/18  2:55 PM  Result Value Ref Range   Sodium 140 135 - 145 mmol/L   Potassium 3.3 (L) 3.5 - 5.1 mmol/L   Chloride 106 98 - 111 mmol/L   CO2 25 22 - 32 mmol/L   Glucose, Bld 89 70 - 99 mg/dL   BUN 17 6 - 20 mg/dL   Creatinine, Ser 1.22 (H) 0.44 - 1.00 mg/dL   Calcium 8.5 (L) 8.9 - 10.3 mg/dL   GFR calc non Af Amer 49 (L) >60 mL/min   GFR calc Af Amer 57 (L) >60 mL/min  Anion gap 9 5 - 15    Comment: Performed at [facility], [address], [place], [place] [address]  CBC WITH DIFFERENTIAL     Status: None   Collection Time: 04/24/18  2:55 PM  Result Value Ref Range   WBC 5.1 4.0 - 10.5 K/uL   RBC 4.16 3.87 - 5.11 MIL/uL   Hemoglobin 12.7 12.0 - 15.0 g/dL   HCT 39.7 36.0 - 46.0 %   MCV 95.4 80.0 - 100.0 fL   MCH 30.5 26.0 - 34.0 pg   MCHC 32.0 30.0 - 36.0 g/dL   RDW 13.8 11.5 - 15.5 %   Platelets 237 150 - 400 K/uL   nRBC 0.0 0.0 - 0.2 %   Neutrophils Relative % 47 %   Neutro Abs 2.4 1.7 - 7.7 K/uL   Lymphocytes Relative 40 %   Lymphs Abs 2.0 0.7 - 4.0 K/uL   Monocytes Relative 9 %   Monocytes Absolute 0.4 0.1 - 1.0 K/uL   Eosinophils Relative 3 %   Eosinophils Absolute 0.2 0.0 - 0.5 K/uL   Basophils  Relative 1 %   Basophils Absolute 0.0 0.0 - 0.1 K/uL   Immature Granulocytes 0 %   Abs Immature Granulocytes 0.01 0.00 - 0.07 K/uL    Comment: Performed at [facility], [address] [place], [place] [address]   No results found.  Review of Systems  Musculoskeletal: Positive for back pain and joint pain.  Neurological: Negative for sensory change.  All other systems reviewed and are negative.   Last menstrual period 02/17/2011.   Physical Exam Vitals signs and nursing note reviewed.  Constitutional:      General: She is not in acute distress.    Appearance: She is well-developed. She is not ill-appearing, toxic-appearing or diaphoretic.  HENT:     Head: Normocephalic and atraumatic.     Right Ear: External ear normal.     Left Ear: External ear normal.     Nose: Nose normal.     Mouth/Throat:     Mouth: Mucous membranes are moist.     Pharynx: No oropharyngeal exudate.  Eyes:     General: No scleral icterus.       Right eye: No discharge.        Left eye: No discharge.     Extraocular Movements: Extraocular movements intact.     Conjunctiva/sclera: Conjunctivae normal.     Pupils: Pupils are equal, round, and reactive to light.  Neck:     Musculoskeletal: Normal range of motion and neck supple.     Thyroid: No thyromegaly.     Vascular: No JVD.     Trachea: No tracheal deviation.  Cardiovascular:     Rate and Rhythm: Normal rate.     Chest Wall: PMI is not displaced.     Pulses: Normal pulses.  Pulmonary:     Effort: Pulmonary effort is normal. No respiratory distress.     Breath sounds: No stridor. No wheezing.  Abdominal:     General: Bowel sounds are normal. There is no distension.     Palpations: Abdomen is soft. There is no mass.     Tenderness: There is no abdominal tenderness. There is no rebound.  Musculoskeletal:     Right lower leg: No edema.     Left lower leg: No edema.  Lymphadenopathy:     Cervical: No cervical adenopathy.     Lower Body: No  right inguinal adenopathy. No left inguinal adenopathy.  Skin:    General: Skin is warm and dry.     Capillary Refill: Capillary refill takes less than 2 seconds.     Findings: No ecchymosis or rash. Rash is not nodular or papular.     Nails: There is no clubbing.   Neurological:     General: No focal deficit present.     Mental Status: She is alert and oriented to person, place, and time.     Cranial Nerves: No cranial nerve deficit.     Sensory: No sensory deficit.     Motor: No abnormal muscle tone.     Coordination: Coordination normal.     Deep Tendon Reflexes: Reflexes are normal and symmetric. Reflexes normal.  Psychiatric:        Mood and Affect: Mood normal.        Behavior: Behavior normal.        Thought Content: Thought content normal.        Judgment: Judgment normal.   Right knee tenderness along the medial joint line no detectable effusion Range of motion is limited by pain flexion up to 90 degrees extension seems to have a 5 degree block No instability detected in the ACL PCL or collateral ligaments Muscle strength and tone are normal without tremor Skin was clean dry and intact Sensation in the limb was normal pulse perfusion and vascularity was normal    Assessment/Plan Arthroscopy right knee with partial medial meniscectomy  Arther Abbott, MD 04/25/2018, 12:09 PM

## 2018-04-27 ENCOUNTER — Encounter (HOSPITAL_COMMUNITY): Payer: Self-pay

## 2018-04-27 ENCOUNTER — Ambulatory Visit (HOSPITAL_COMMUNITY): Payer: BLUE CROSS/BLUE SHIELD | Admitting: Anesthesiology

## 2018-04-27 ENCOUNTER — Encounter (HOSPITAL_COMMUNITY)
Admission: RE | Disposition: A | Discharge status: Home / Self Care | Payer: Self-pay | Source: Home / Self Care | Attending: Orthopedic Surgery

## 2018-04-27 ENCOUNTER — Other Ambulatory Visit: Payer: Self-pay

## 2018-04-27 ENCOUNTER — Ambulatory Visit (HOSPITAL_COMMUNITY)
Admission: RE | Admit: 2018-04-27 | Discharge: 2018-04-27 | Disposition: A | Discharge status: Home / Self Care | Payer: BLUE CROSS/BLUE SHIELD | Attending: Orthopedic Surgery | Admitting: Orthopedic Surgery

## 2018-04-27 DIAGNOSIS — X58XXXA Exposure to other specified factors, initial encounter: Secondary | ICD-10-CM | POA: Insufficient documentation

## 2018-04-27 DIAGNOSIS — Z6841 Body Mass Index (BMI) 40.0 and over, adult: Secondary | ICD-10-CM | POA: Diagnosis not present

## 2018-04-27 DIAGNOSIS — K219 Gastro-esophageal reflux disease without esophagitis: Secondary | ICD-10-CM | POA: Diagnosis not present

## 2018-04-27 DIAGNOSIS — I10 Essential (primary) hypertension: Secondary | ICD-10-CM | POA: Diagnosis not present

## 2018-04-27 DIAGNOSIS — Z8673 Personal history of transient ischemic attack (TIA), and cerebral infarction without residual deficits: Secondary | ICD-10-CM | POA: Diagnosis not present

## 2018-04-27 DIAGNOSIS — Z905 Acquired absence of kidney: Secondary | ICD-10-CM | POA: Diagnosis not present

## 2018-04-27 DIAGNOSIS — Z85528 Personal history of other malignant neoplasm of kidney: Secondary | ICD-10-CM | POA: Insufficient documentation

## 2018-04-27 DIAGNOSIS — S83241A Other tear of medial meniscus, current injury, right knee, initial encounter: Secondary | ICD-10-CM | POA: Insufficient documentation

## 2018-04-27 DIAGNOSIS — M23321 Other meniscus derangements, posterior horn of medial meniscus, right knee: Secondary | ICD-10-CM

## 2018-04-27 DIAGNOSIS — M23203 Derangement of unspecified medial meniscus due to old tear or injury, right knee: Secondary | ICD-10-CM | POA: Diagnosis not present

## 2018-04-27 DIAGNOSIS — M25561 Pain in right knee: Secondary | ICD-10-CM | POA: Diagnosis not present

## 2018-04-27 HISTORY — PX: KNEE ARTHROSCOPY WITH MEDIAL MENISECTOMY: SHX5651

## 2018-04-27 SURGERY — ARTHROSCOPY, KNEE, WITH MEDIAL MENISCECTOMY
Anesthesia: General | Laterality: Right

## 2018-04-27 MED ORDER — HYDROMORPHONE HCL 1 MG/ML IJ SOLN
INTRAMUSCULAR | Status: AC
  Filled 2018-04-27: qty 0.5

## 2018-04-27 MED ORDER — SODIUM CHLORIDE 0.9 % IR SOLN
Status: DC | PRN
  Administered 2018-04-27: 1000 mL

## 2018-04-27 MED ORDER — MIDAZOLAM HCL 2 MG/2ML IJ SOLN
INTRAMUSCULAR | Status: AC
  Filled 2018-04-27: qty 2

## 2018-04-27 MED ORDER — PROMETHAZINE HCL 12.5 MG PO TABS: 12.5000 mg | ORAL_TABLET | Freq: Four times a day (QID) | ORAL | 0 refills | Status: DC | PRN

## 2018-04-27 MED ORDER — HYDROMORPHONE HCL 1 MG/ML IJ SOLN
0.2500 mg | INTRAMUSCULAR | Status: DC | PRN
  Administered 2018-04-27: 0.25 mg via INTRAVENOUS
  Administered 2018-04-27: 0.5 mg via INTRAVENOUS
  Administered 2018-04-27: 0.25 mg via INTRAVENOUS
  Filled 2018-04-27: qty 0.5

## 2018-04-27 MED ORDER — ONDANSETRON HCL 4 MG/2ML IJ SOLN
INTRAMUSCULAR | Status: AC
  Filled 2018-04-27: qty 2

## 2018-04-27 MED ORDER — EPINEPHRINE PF 1 MG/ML IJ SOLN
INTRAMUSCULAR | Status: AC
  Filled 2018-04-27: qty 5

## 2018-04-27 MED ORDER — SODIUM CHLORIDE 0.9 % IR SOLN
Status: DC | PRN
  Administered 2018-04-27 (×2): 3000 mL

## 2018-04-27 MED ORDER — FENTANYL CITRATE (PF) 250 MCG/5ML IJ SOLN
INTRAMUSCULAR | Status: AC
  Filled 2018-04-27: qty 5

## 2018-04-27 MED ORDER — LACTATED RINGERS IV SOLN
INTRAVENOUS | Status: DC
  Administered 2018-04-27: 09:00:00 via INTRAVENOUS

## 2018-04-27 MED ORDER — ONDANSETRON HCL 4 MG/2ML IJ SOLN
4.0000 mg | Freq: Once | INTRAMUSCULAR | Status: AC
  Administered 2018-04-27: 4 mg via INTRAVENOUS

## 2018-04-27 MED ORDER — HYDROCODONE-ACETAMINOPHEN 7.5-325 MG PO TABS: 1.0000 | ORAL_TABLET | Freq: Once | ORAL | Status: DC | PRN

## 2018-04-27 MED ORDER — KETOROLAC TROMETHAMINE 30 MG/ML IJ SOLN
INTRAMUSCULAR | Status: AC
  Filled 2018-04-27: qty 1

## 2018-04-27 MED ORDER — CEFAZOLIN SODIUM-DEXTROSE 2-4 GM/100ML-% IV SOLN
2.0000 g | INTRAVENOUS | Status: AC
  Administered 2018-04-27: 2 g via INTRAVENOUS

## 2018-04-27 MED ORDER — HYDROCODONE-ACETAMINOPHEN 7.5-325 MG PO TABS: 1.0000 | ORAL_TABLET | ORAL | 0 refills | Status: DC | PRN

## 2018-04-27 MED ORDER — HYDROCODONE-ACETAMINOPHEN 7.5-325 MG PO TABS
1.0000 | ORAL_TABLET | Freq: Once | ORAL | Status: AC
  Administered 2018-04-27: 1 via ORAL

## 2018-04-27 MED ORDER — KETOROLAC TROMETHAMINE 30 MG/ML IJ SOLN
30.0000 mg | Freq: Once | INTRAMUSCULAR | Status: AC
  Administered 2018-04-27: 30 mg via INTRAVENOUS

## 2018-04-27 MED ORDER — CHLORHEXIDINE GLUCONATE 4 % EX LIQD: 60.0000 mL | Freq: Once | CUTANEOUS | Status: DC

## 2018-04-27 MED ORDER — PROMETHAZINE HCL 25 MG/ML IJ SOLN: 6.2500 mg | INTRAMUSCULAR | Status: DC | PRN

## 2018-04-27 MED ORDER — CEFAZOLIN SODIUM-DEXTROSE 2-4 GM/100ML-% IV SOLN
INTRAVENOUS | Status: AC
  Filled 2018-04-27: qty 100

## 2018-04-27 MED ORDER — FENTANYL CITRATE (PF) 100 MCG/2ML IJ SOLN
INTRAMUSCULAR | Status: DC | PRN
  Administered 2018-04-27: 25 ug via INTRAVENOUS
  Administered 2018-04-27 (×2): 50 ug via INTRAVENOUS

## 2018-04-27 MED ORDER — MIDAZOLAM HCL 5 MG/5ML IJ SOLN
INTRAMUSCULAR | Status: DC | PRN
  Administered 2018-04-27: 2 mg via INTRAVENOUS

## 2018-04-27 MED ORDER — MIDAZOLAM HCL 2 MG/2ML IJ SOLN: 0.5000 mg | Freq: Once | INTRAMUSCULAR | Status: DC | PRN

## 2018-04-27 MED ORDER — HYDROCODONE-ACETAMINOPHEN 7.5-325 MG PO TABS
ORAL_TABLET | ORAL | Status: AC
  Filled 2018-04-27: qty 1

## 2018-04-27 MED ORDER — ONDANSETRON HCL 4 MG/2ML IJ SOLN
INTRAMUSCULAR | Status: DC | PRN
  Administered 2018-04-27: 4 mg via INTRAVENOUS

## 2018-04-27 MED ORDER — PROPOFOL 10 MG/ML IV BOLUS
INTRAVENOUS | Status: DC | PRN
  Administered 2018-04-27: 20 mg via INTRAVENOUS
  Administered 2018-04-27: 150 mg via INTRAVENOUS

## 2018-04-27 MED ORDER — BUPIVACAINE-EPINEPHRINE (PF) 0.5% -1:200000 IJ SOLN: INTRAMUSCULAR | Status: DC | PRN

## 2018-04-27 MED ORDER — PROPOFOL 10 MG/ML IV BOLUS
INTRAVENOUS | Status: AC
  Filled 2018-04-27: qty 40

## 2018-04-27 MED ORDER — BUPIVACAINE-EPINEPHRINE (PF) 0.25% -1:200000 IJ SOLN
INTRAMUSCULAR | Status: AC
  Filled 2018-04-27: qty 60

## 2018-04-27 MED ORDER — BUPIVACAINE-EPINEPHRINE (PF) 0.25% -1:200000 IJ SOLN
INTRAMUSCULAR | Status: DC | PRN
  Administered 2018-04-27: 60 mL

## 2018-04-27 SURGICAL SUPPLY — 49 items
BANDAGE ELASTIC 6 LF NS (GAUZE/BANDAGES/DRESSINGS) ×3 IMPLANT
BLADE AGGRESSIVE PLUS 4.0 (BLADE) ×3 IMPLANT
BLADE SURG SZ11 CARB STEEL (BLADE) ×3 IMPLANT
CHLORAPREP W/TINT 26ML (MISCELLANEOUS) ×3 IMPLANT
CLOTH BEACON ORANGE TIMEOUT ST (SAFETY) ×3 IMPLANT
COOLER CRYO IC GRAV AND TUBE (ORTHOPEDIC SUPPLIES) ×3 IMPLANT
COVER WAND RF STERILE (DRAPES) ×2 IMPLANT
CUFF CRYO KNEE LG 20X31 COOLER (ORTHOPEDIC SUPPLIES) ×2 IMPLANT
CUFF TOURNIQUET SINGLE 44IN (TOURNIQUET CUFF) ×2 IMPLANT
DECANTER SPIKE VIAL GLASS SM (MISCELLANEOUS) ×6 IMPLANT
GAUZE 4X4 16PLY RFD (DISPOSABLE) ×3 IMPLANT
GAUZE SPONGE 4X4 12PLY STRL (GAUZE/BANDAGES/DRESSINGS) ×3 IMPLANT
GAUZE SPONGE 4X4 16PLY XRAY LF (GAUZE/BANDAGES/DRESSINGS) ×3 IMPLANT
GAUZE XEROFORM 5X9 LF (GAUZE/BANDAGES/DRESSINGS) ×3 IMPLANT
GLOVE BIOGEL M 7.0 STRL (GLOVE) ×2 IMPLANT
GLOVE BIOGEL PI IND STRL 7.0 (GLOVE) ×2 IMPLANT
GLOVE BIOGEL PI INDICATOR 7.0 (GLOVE) ×4
GLOVE SKINSENSE NS SZ8.0 LF (GLOVE) ×2
GLOVE SKINSENSE STRL SZ8.0 LF (GLOVE) ×1 IMPLANT
GLOVE SS N UNI LF 8.5 STRL (GLOVE) ×3 IMPLANT
GOWN STRL REUS W/ TWL LRG LVL3 (GOWN DISPOSABLE) ×1 IMPLANT
GOWN STRL REUS W/TWL LRG LVL3 (GOWN DISPOSABLE) ×2
GOWN STRL REUS W/TWL XL LVL3 (GOWN DISPOSABLE) ×3 IMPLANT
IV NS IRRIG 3000ML ARTHROMATIC (IV SOLUTION) ×6 IMPLANT
KIT BLADEGUARD II DBL (SET/KITS/TRAYS/PACK) ×3 IMPLANT
KIT TURNOVER CYSTO (KITS) ×3 IMPLANT
MANIFOLD NEPTUNE II (INSTRUMENTS) ×3 IMPLANT
MARKER SKIN DUAL TIP RULER LAB (MISCELLANEOUS) ×3 IMPLANT
NDL HYPO 18GX1.5 BLUNT FILL (NEEDLE) ×1 IMPLANT
NDL HYPO 21X1.5 SAFETY (NEEDLE) ×1 IMPLANT
NDL SPNL 18GX3.5 QUINCKE PK (NEEDLE) ×1 IMPLANT
NEEDLE HYPO 18GX1.5 BLUNT FILL (NEEDLE) ×3 IMPLANT
NEEDLE HYPO 21X1.5 SAFETY (NEEDLE) ×3 IMPLANT
NEEDLE SPNL 18GX3.5 QUINCKE PK (NEEDLE) ×3 IMPLANT
NS IRRIG 1000ML POUR BTL (IV SOLUTION) ×3 IMPLANT
PACK ARTHRO LIMB DRAPE STRL (MISCELLANEOUS) ×3 IMPLANT
PAD ABD 5X9 TENDERSORB (GAUZE/BANDAGES/DRESSINGS) ×3 IMPLANT
PAD ARMBOARD 7.5X6 YLW CONV (MISCELLANEOUS) ×3 IMPLANT
PADDING CAST COTTON 6X4 STRL (CAST SUPPLIES) ×3 IMPLANT
PROBE BIPOLAR 50 DEGREE SUCT (MISCELLANEOUS) IMPLANT
PROBE BIPOLAR ATHRO 135MM 90D (MISCELLANEOUS) IMPLANT
SET ARTHROSCOPY INST (INSTRUMENTS) ×3 IMPLANT
SET BASIN LINEN APH (SET/KITS/TRAYS/PACK) ×3 IMPLANT
SUT ETHILON 3 0 FSL (SUTURE) ×3 IMPLANT
SYR 10ML LL (SYRINGE) ×3 IMPLANT
SYR 30ML LL (SYRINGE) ×3 IMPLANT
TUBE CONNECTING 12'X1/4 (SUCTIONS) ×2
TUBE CONNECTING 12X1/4 (SUCTIONS) ×4 IMPLANT
TUBING ARTHRO INFLOW-ONLY STRL (TUBING) ×3 IMPLANT

## 2018-04-27 NOTE — Discharge Instructions (Signed)
The medial meniscus was torn he had a small amount of arthritis under your kneecap the remaining portions of your knee looked very good especially with your age of 42  We removed the torn piece of meniscus and the remaining 85% of your meniscus was normal  Take the hydrocodone for the first 5 days and then take ibuprofen or Tylenol for pain use ice Cryo/Cuff as needed.  Use the walker as needed weightbearing can be done as tolerated as much pressure as you can stand on the knee is fine  Use the Cryo/Cuff as instructed  General Anesthesia, Adult, Care After This sheet gives you information about how to care for yourself after your procedure. Your health care provider may also give you more specific instructions. If you have problems or questions, contact your health care provider. What can I expect after the procedure? After the procedure, the following side effects are common:  Pain or discomfort at the IV site.  Nausea.  Vomiting.  Sore throat.  Trouble concentrating.  Feeling cold or chills.  Weak or tired.  Sleepiness and fatigue.  Soreness and body aches. These side effects can affect parts of the body that were not involved in surgery. Follow these instructions at home:  For at least 24 hours after the procedure:  Have a responsible adult stay with you. It is important to have someone help care for you until you are awake and alert.  Rest as needed.  Do not: ? Participate in activities in which you could fall or become injured. ? Drive. ? Use heavy machinery. ? Drink alcohol. ? Take sleeping pills or medicines that cause drowsiness. ? Make important decisions or sign legal documents. ? Take care of children on your own. Eating and drinking  Follow any instructions from your health care provider about eating or drinking restrictions.  When you feel hungry, start by eating small amounts of foods that are soft and easy to digest (bland), such as toast. Gradually  return to your regular diet.  Drink enough fluid to keep your urine pale yellow.  If you vomit, rehydrate by drinking water, juice, or clear broth. General instructions  If you have sleep apnea, surgery and certain medicines can increase your risk for breathing problems. Follow instructions from your health care provider about wearing your sleep device: ? Anytime you are sleeping, including during daytime naps. ? While taking prescription pain medicines, sleeping medicines, or medicines that make you drowsy.  Return to your normal activities as told by your health care provider. Ask your health care provider what activities are safe for you.  Take over-the-counter and prescription medicines only as told by your health care provider.  If you smoke, do not smoke without supervision.  Keep all follow-up visits as told by your health care provider. This is important. Contact a health care provider if:  You have nausea or vomiting that does not get better with medicine.  You cannot eat or drink without vomiting.  You have pain that does not get better with medicine.  You are unable to pass urine.  You develop a skin rash.  You have a fever.  You have redness around your IV site that gets worse. Get help right away if:  You have difficulty breathing.  You have chest pain.  You have blood in your urine or stool, or you vomit blood. Summary  After the procedure, it is common to have a sore throat or nausea. It is also common to feel  tired.  Have a responsible adult stay with you for the first 24 hours after general anesthesia. It is important to have someone help care for you until you are awake and alert.  When you feel hungry, start by eating small amounts of foods that are soft and easy to digest (bland), such as toast. Gradually return to your regular diet.  Drink enough fluid to keep your urine pale yellow.  Return to your normal activities as told by your health care  provider. Ask your health care provider what activities are safe for you. This information is not intended to replace advice given to you by your health care provider. Make sure you discuss any questions you have with your health care provider. Document Released: 07/05/2000 Document Revised: 11/12/2016 Document Reviewed: 11/12/2016 Elsevier Interactive Patient Education  2019 [employer].  Knee Arthroscopy, Care After This sheet gives you information about how to care for yourself after your procedure. Your health care provider may also give you more specific instructions. If you have problems or questions, contact your health care provider. What can I expect after the procedure? After the procedure, it is common to have:  Soreness.  Swelling.  Pain that can be relieved by taking pain medicine. Follow these instructions at home: Incision care   Follow instructions from your health care provider about how to take care of your incisions. Make sure you: ? Wash your hands with soap and water before you change your bandage (dressing). If soap and water are not available, use hand sanitizer. ? Change your dressing as told by your health care provider. ? Leave stitches (sutures), staples, skin glue, or adhesive strips in place. These skin closures may need to stay in place for 2 weeks or longer. If adhesive strip edges start to loosen and curl up, you may trim the loose edges. Do not remove adhesive strips completely unless your health care provider tells you to do that.  Check your incision areas every day for signs of infection. Check for: ? Redness. ? More swelling or pain. ? Fluid or blood. ? Warmth. ? Pus or a bad smell. Bathing  Do not take baths, swim, or use a hot tub until your health care provider approves. Ask your health care provider if you may take showers. You may only be allowed to take sponge baths. Activity  Do not use your knee to support your body weight until your  health care provider says that you can. Follow weight-bearing restrictions as told. Use crutches or other devices to help you move around (assistive devices) as directed.  Ask your health care provider what activities are safe for you during recovery, and what activities you need to avoid.  If physical therapy was prescribed, do exercises as directed. Doing exercises may help improve knee movement and flexibility (range of motion).  Do not lift anything that is heavier than 10 lb (4.5 kg), or the limit that you are told, until your health care provider says that it is safe. Driving  Do not drive until your health care provider approves. You may be able to drive after 1-3 weeks.  Do not drive or use heavy machinery while taking prescription pain medicine. Managing pain, stiffness, and swelling   If directed, put ice on the injured area: ? Put ice in a plastic bag or use the icing device (cold therapy unit) that you were given. Follow instructions from your health care provider about how to use the icing device. ? Place a  towel between your skin and the bag or between your skin and the icing device. ? Leave the ice on for 20 minutes, 2-3 times a day.  Move your toes often to avoid stiffness and to lessen swelling.  Raise (elevate) the injured area above the level of your heart while you are sitting or lying down. If you are taking blood thinners:  Before you take any medicines that contain aspirin or NSAIDs, talk with your health care provider. These medicines increase your risk for dangerous bleeding.  Take your medicine exactly as told, at the same time every day.  Avoid activities that could cause injury or bruising, and follow instructions about how to prevent falls.  Wear a medical alert bracelet or carry a card that lists what medicines you take. General instructions  Take over-the-counter and prescription medicines only as told by your health care provider.  If you are taking  prescription pain medicine, take actions to prevent or treat constipation. Your health care provider may recommend that you: ? Drink enough fluid to keep your urine pale yellow. ? Eat foods that are high in fiber, such as fresh fruits and vegetables, whole grains, and beans. ? Limit foods that are high in fat and processed sugars, such as fried or sweet foods. ? Take an over-the-counter or prescription medicines for constipation.  Do not use any products that contain nicotine or tobacco, such as cigarettes and e-cigarettes. These can delay incision or bone healing. If you need help quitting, ask your health care provider.  Wear compression stockings as told by your health care provider. These stockings help to prevent blood clots and reduce swelling in your legs.  Keep all follow-up visits as told by your health care provider. This is important. Contact a health care provider if you:  Have a fever.  Have severe pain.  Have redness around an incision.  Have more swelling.  Have fluid or blood coming from an incision.  Notice that an incision feels warm to the touch.  Notice pus or a bad smell coming from an incision.  Notice that an incision opens up.  Develop a rash. Get help right away if you:  Have difficulty breathing.  Have shortness of breath.  Have chest pain.  Develop pain in your lower leg or at the back of your knee.  Have numbness or tingling in your lower leg or your foot. Summary  Raise (elevate) the injured area above the level of your heart while you are sitting or lying down.  To help relieve pain and swelling, put ice on your leg for 20 minutes at a time, 2-3 times a day.  If you were prescribed a blood thinner, avoid activities that could cause injury or bruising, and follow instructions about how to prevent falls.  If physical therapy was prescribed, do exercises as directed. Doing exercises may help improve range of motion. This information is not  intended to replace advice given to you by your health care provider. Make sure you discuss any questions you have with your health care provider. Document Released: 10/16/2004 Document Revised: 02/09/2017 Document Reviewed: 02/09/2017 Elsevier Interactive Patient Education  2019 [employer].

## 2018-04-27 NOTE — Op Note (Signed)
04/27/2018  9:27 AM  PATIENT:  Jo Hale  58 y.o. female  PRE-OPERATIVE DIAGNOSIS:  torn medial meniscus right knee  POST-OPERATIVE DIAGNOSIS: Torn medial meniscus right knee  Findings  posterior horn tear medial meniscus normal articular cartilage medial femoral condyle mild chondral changes medial plateau. ACL PCL intact   Lateral compartment normal including meniscus and articular surfaces Trochlea normal Small grade 2 lesion just off the central ridge of the patella towards the lateral side  PROCEDURE:  Procedure(s): RIGHT KNEE ARTHROSCOPY WITH MEDIAL MENISECTOMY ([facility])-[id number]  SURGEON:  Surgeon(s) and Role:    Carole Civil, MD - Primary  Details of surgery The patient was seen in preop the surgical site was confirmed and right knee marked chart review completed chart update completed.  Patient taken to surgery given Ancef 2 g and was administered general anesthesia in the supine position  The left leg was placed in a padded well leg holder and the right leg was placed in an arthroscopic leg holder  After sterile prep and drape timeout confirmed the procedure patient and right knee as surgical site  A lateral portal was established scope was placed into the joint circumferential tour of the knee was performed and the findings are noted below.  A medial portal was established with the assistance of a spinal needle and a probe was placed into the joint and the arthroscopic evaluation of the joint was repeated.  Findings  posterior horn tear medial meniscus normal articular cartilage medial femoral condyle mild chondral changes medial plateau. ACL PCL intact   Lateral compartment normal including meniscus and articular surfaces Trochlea normal Small grade 2 lesion just off the central ridge of the patella towards the lateral side  A straight biter was used to resect the meniscal tear meniscal fragments were removed with a shaver meniscus was balanced with  a shaver and the meniscus was probed to confirm stable rim.  The joint was irrigated suctioned dry and the portals were closed with 3-0 nylon suture we injected a total of 60 cc of Marcaine with epinephrine  The knee was covered with sterile bandages Ace wrap and Cryo/Cuff which was activated  The patient was taken from the operating room after extubation in stable condition brought to recovery.  PHYSICIAN ASSISTANT:   ASSISTANTS: none   ANESTHESIA:   general  EBL:  0 mL   BLOOD ADMINISTERED:none  DRAINS: none   LOCAL MEDICATIONS USED:  MARCAINE     SPECIMEN:  No Specimen  DISPOSITION OF SPECIMEN:  N/A  COUNTS:  YES  TOURNIQUET:  * Missing tourniquet times found for documented tourniquets in log: [id number] *  DICTATION: .Viviann Spare Dictation  PLAN OF CARE: Discharge to home after PACU  PATIENT DISPOSITION:  PACU - hemodynamically stable.   Delay start of Pharmacological VTE agent (>24hrs) due to surgical blood loss or risk of bleeding: not applicable

## 2018-04-27 NOTE — Anesthesia Preprocedure Evaluation (Addendum)
Anesthesia Evaluation  Patient identified by MRN, date of birth, ID band Patient awake    Reviewed: Allergy & Precautions, NPO status , Patient's Chart, lab work & pertinent test results  History of Anesthesia Complications (+) PONV  Airway Mallampati: II  TM Distance: >3 FB Neck ROM: Full    Dental no notable dental hx. (+) Teeth Intact   Pulmonary neg pulmonary ROS,  Reports recent URI Denies Fever, sounds clear -WTP Husband reports snoring -poss OSA -never formally tested    Pulmonary exam normal breath sounds clear to auscultation       Cardiovascular Exercise Tolerance: Good hypertension, Pt. on medications negative cardio ROS Normal cardiovascular examI Rhythm:Regular Rate:Normal     Neuro/Psych TIA Neuromuscular disease negative psych ROS   GI/Hepatic Neg liver ROS, GERD  Medicated and Controlled,  Endo/Other  Morbid obesity  Renal/GU Renal InsufficiencyRenal diseaseH/o ? Cancer of kidney -tx'd 05/2017- denies CT/RT  negative genitourinary   Musculoskeletal negative musculoskeletal ROS (+)   Abdominal   Peds negative pediatric ROS (+)  Hematology negative hematology ROS (+)   Anesthesia Other Findings   Reproductive/Obstetrics negative OB ROS                            Anesthesia Physical Anesthesia Plan  ASA: III  Anesthesia Plan: General   Post-op Pain Management:    Induction: Intravenous  PONV Risk Score and Plan:   Airway Management Planned: Oral ETT and LMA  Additional Equipment:   Intra-op Plan:   Post-operative Plan: Extubation in OR  Informed Consent: I have reviewed the patients History and Physical, chart, labs and discussed the procedure including the risks, benefits and alternatives for the proposed anesthesia with the patient or authorized representative who has indicated his/her understanding and acceptance.     Dental advisory given  Plan  Discussed with: CRNA  Anesthesia Plan Comments: (LMA vs ETT as needed )        Anesthesia Quick Evaluation

## 2018-04-27 NOTE — Interval H&P Note (Signed)
History and Physical Interval Note:  04/27/2018 8:17 AM  Jo Hale  has presented today for surgery, with the diagnosis of torn medial meniscus right knee  The various methods of treatment have been discussed with the patient and family. After consideration of risks, benefits and other options for treatment, the patient has consented to  Procedure(s): RIGHT KNEE ARTHROSCOPY WITH MEDIAL MENISECTOMY (Right) as a surgical intervention .  The patient's history has been reviewed, patient examined, no change in status, stable for surgery.  I have reviewed the patient's chart and labs.  Questions were answered to the patient's satisfaction.     Arther Abbott

## 2018-04-27 NOTE — Transfer of Care (Signed)
Immediate Anesthesia Transfer of Care Note  Patient: Jo Hale  Procedure(s) Performed: RIGHT KNEE ARTHROSCOPY WITH PARTIAL MEDIAL MENISECTOMY (Right )  Patient Location: PACU  Anesthesia Type:General  Level of Consciousness: awake and patient cooperative  Airway & Oxygen Therapy: Patient Spontanous Breathing  Post-op Assessment: Report given to RN and Post -op Vital signs reviewed and stable  Post vital signs: Reviewed and stable  Last Vitals:  Vitals Value Taken Time  BP    Temp    Pulse 78 04/27/2018  9:35 AM  Resp 20 04/27/2018  9:35 AM  SpO2 96 % 04/27/2018  9:35 AM  Vitals shown include unvalidated device data.  Last Pain:  Vitals:   04/27/18 0748  TempSrc: Oral  PainSc: 0-No pain      Patients Stated Pain Goal: 4 (00/63/49 4944)  Complications: No apparent anesthesia complications

## 2018-04-27 NOTE — Brief Op Note (Signed)
04/27/2018  9:27 AM  PATIENT:  Jo Hale  58 y.o. female  PRE-OPERATIVE DIAGNOSIS:  torn medial meniscus right knee  POST-OPERATIVE DIAGNOSIS: Torn medial meniscus right knee  PROCEDURE:  Procedure(s): RIGHT KNEE ARTHROSCOPY WITH MEDIAL MENISECTOMY (Right)-29881  SURGEON:  Surgeon(s) and Role:    Carole Civil, MD - Primary  Details of surgery The patient was seen in preop the surgical site was confirmed and right knee marked chart review completed chart update completed.  Patient taken to surgery given Ancef 2 g and was administered general anesthesia in the supine position  The left leg was placed in a padded well leg holder and the right leg was placed in an arthroscopic leg holder  After sterile prep and drape timeout confirmed the procedure patient and right knee as surgical site  A lateral portal was established scope was placed into the joint circumferential tour of the knee was performed and the findings are noted below.  A medial portal was established with the assistance of a spinal needle and a probe was placed into the joint and the arthroscopic evaluation of the joint was repeated.  Findings posterior horn tear medial meniscus normal articular cartilage medial femoral condyle mild chondral changes medial plateau. ACL PCL intact   Lateral compartment normal including meniscus and articular surfaces Trochlea normal Small grade 2 lesion just off the central ridge of the patella towards the lateral side  A straight biter was used to resect the meniscal tear meniscal fragments were removed with a shaver meniscus was balanced with a shaver and the meniscus was probed to confirm stable rim.  The joint was irrigated suctioned dry and the portals were closed with 3-0 nylon suture we injected a total of 60 cc of Marcaine with epinephrine  The knee was covered with sterile bandages Ace wrap and Cryo/Cuff which was activated  The patient was taken from the  operating room after extubation in stable condition brought to recovery.  PHYSICIAN ASSISTANT:   ASSISTANTS: none   ANESTHESIA:   general  EBL:  0 mL   BLOOD ADMINISTERED:none  DRAINS: none   LOCAL MEDICATIONS USED:  MARCAINE     SPECIMEN:  No Specimen  DISPOSITION OF SPECIMEN:  N/A  COUNTS:  YES  TOURNIQUET:  * Missing tourniquet times found for documented tourniquets in log: [id number] *  DICTATION: .Viviann Spare Dictation  PLAN OF CARE: Discharge to home after PACU  PATIENT DISPOSITION:  PACU - hemodynamically stable.   Delay start of Pharmacological VTE agent (>24hrs) due to surgical blood loss or risk of bleeding: not applicable

## 2018-04-27 NOTE — Anesthesia Postprocedure Evaluation (Signed)
Anesthesia Post Note  Patient: Jo Hale  Procedure(s) Performed: RIGHT KNEE ARTHROSCOPY WITH PARTIAL MEDIAL MENISECTOMY (Right )  Patient location during evaluation: PACU Anesthesia Type: General Level of consciousness: awake and alert and patient cooperative Pain management: satisfactory to patient Vital Signs Assessment: post-procedure vital signs reviewed and stable Respiratory status: spontaneous breathing Cardiovascular status: stable Postop Assessment: no apparent nausea or vomiting Anesthetic complications: no     Last Vitals:  Vitals:   04/27/18 0934 04/27/18 0945  BP: (!) 143/76 137/81  Pulse: 78 71  Resp: 20 14  Temp: 36.8 C   SpO2: 96% 98%    Last Pain:  Vitals:   04/27/18 0934  TempSrc:   PainSc: 6                  Claudia Alvizo

## 2018-04-27 NOTE — Anesthesia Procedure Notes (Signed)
Procedure Name: LMA Insertion Date/Time: 04/27/2018 8:39 AM Performed by: Vista Deck, CRNA Pre-anesthesia Checklist: Patient identified, Patient being monitored, Emergency Drugs available, Timeout performed and Suction available Patient Re-evaluated:Patient Re-evaluated prior to induction Oxygen Delivery Method: Circle System Utilized Preoxygenation: Pre-oxygenation with 100% oxygen Induction Type: IV induction Ventilation: Mask ventilation without difficulty LMA: LMA inserted LMA Size: 3.0 Number of attempts: 1 Placement Confirmation: positive ETCO2 and breath sounds checked- equal and bilateral Tube secured with: Tape Dental Injury: Teeth and Oropharynx as per pre-operative assessment

## 2018-04-28 ENCOUNTER — Encounter (HOSPITAL_COMMUNITY): Payer: Self-pay | Admitting: Orthopedic Surgery

## 2018-05-01 ENCOUNTER — Other Ambulatory Visit (HOSPITAL_COMMUNITY): Payer: Self-pay | Admitting: Family Medicine

## 2018-05-02 NOTE — Therapy (Signed)
[place], [name], [address] Phone: [phone]   Fax:  [phone]  Patient Details  Name: Jo Hale MRN: [id number] Date of Birth: November 07, 1960 Referring Provider:  No ref. provider found  Encounter Date: 03/30/2018  PHYSICAL THERAPY DISCHARGE SUMMARY  Visits from Start of Care: 10  Current functional level related to goals / functional outcomes: Patient is being discharged from episode of care as she did not return to therapy. At follow up appointment with MD office she discussed options and elected to discontinue therapy and plan for Rt knee menisectomy. Her last visit was on 03/22/18 and on 03/30/18 I spoke with Ms. Ditton and she requested to be placed on hold. She will be discharged as she has not returned.    Remaining deficits: See last note and below goal status.   PT Short Term Goals - 03/22/18 1112            PT SHORT TERM GOAL #1   Title  Patient will demonstrate understanding and report regular compliance with HEP in order to improve lower extremity strength, trunk stability, balance, and overall functional mobility.     Time  2    Period  Weeks    Status  Achieved        PT SHORT TERM GOAL #2   Title  Patient will improve bil knee ROM by 8 degrees for flexion to demonstrate significant improvement in mobility to improve function with gait and functional activities around home.     Time  3    Period  Weeks    Status  On-going        PT SHORT TERM GOAL #3   Title  Patient will perform SLS for 15 seconds or more on bil LE's to demonstrate improved safety with stair ambulation and with ability to step in and out of bath/tub to reduce fall risk.    Time  3    Period  Weeks    Status  On-going           PT Long Term Goals - 03/22/18 1114            PT LONG TERM GOAL #1   Title  Patient will demonstrate improved MMT strength of at least 1/2 grade in all tested  musculature in order to improve ease of stair navigation and overall functional mobility.     Time  6    Period  Weeks    Status  On-going        PT LONG TERM GOAL #2   Title  Patient will report no greater than a 3/10 pain in bil knees over the course of a 1 week period indicating better tolerance to daily activities.     Time  6    Period  Weeks    Status  On-going        PT LONG TERM GOAL #3   Title  Patient will improve bil hamstring flexibility by 10 degrees each to demonstrate improved flexibility to reduce posterior knee pain.    Time  6    Period  Weeks    Status  On-going        PT LONG TERM GOAL #4   Title  Patient will improve FOTO score by 12% or more to indicate significant improvement in mobility and decreased slef reproted limitation with activities related to knee pain.    Time  6    Period  Weeks  Status  On-going     Education / Equipment: See last note for HEP details.  Plan: Patient agrees to discharge.  Patient goals were not met. Patient is being discharged due to not returning since the last visit.  ?????       Kipp Brood, PT, DPT, [facility] Physical Therapist with [facility]  05/02/2018 1:03 PM    [place], [name], [address] Phone: [phone]   Fax:  [phone]

## 2018-05-05 ENCOUNTER — Ambulatory Visit (INDEPENDENT_AMBULATORY_CARE_PROVIDER_SITE_OTHER): Payer: BLUE CROSS/BLUE SHIELD | Admitting: Orthopedic Surgery

## 2018-05-05 ENCOUNTER — Telehealth: Payer: Self-pay | Admitting: Orthopedic Surgery

## 2018-05-05 VITALS — BP 141/76 | HR 79 | Ht 62.0 in | Wt 245.0 lb

## 2018-05-05 DIAGNOSIS — M5442 Lumbago with sciatica, left side: Secondary | ICD-10-CM

## 2018-05-05 DIAGNOSIS — Z9889 Other specified postprocedural states: Secondary | ICD-10-CM

## 2018-05-05 MED ORDER — HYDROCODONE-ACETAMINOPHEN 5-325 MG PO TABS: 1.0000 | ORAL_TABLET | ORAL | 0 refills | Status: AC | PRN

## 2018-05-05 MED ORDER — PREDNISONE 10 MG (48) PO TBPK: ORAL_TABLET | Freq: Every day | ORAL | 1 refills | Status: DC

## 2018-05-05 NOTE — Telephone Encounter (Signed)
-----   Message from Carole Civil, MD sent at 05/05/2018 12:22 PM EST ----- Regarding: RE: Can patient drive yet? Yes  ----- Message ----- From: Uvaldo Bristle Sent: 05/05/2018  10:55 AM EST To: Carole Civil, MD Subject: Can patient drive yet?                         Dr Aline Brochure,   Patient Jo Hale #[id number] forgot to ask at today's visit if  she may drive?  Thank you

## 2018-05-05 NOTE — Telephone Encounter (Signed)
Called back to patient to relay. °

## 2018-05-05 NOTE — Patient Instructions (Signed)
10 you ice 3-4 times a day do the exercises as instructed  Self wean yourself off the crutches  Start the prednisone Dosepak for the sciatica in the left leg

## 2018-05-05 NOTE — Progress Notes (Signed)
POSTOP VISIT  POD # 8  Chief Complaint  Patient presents with  . Follow-up    Recheck on right knee, DOS 04-27-18.   This is the first postop visit is postop day #8 patient says her knee feels pretty good she feels like she has a new leg but she is complaining of some left-sided sciatica which started about 1 to 2 days postop  She still on her crutches  Her portals are clean the sutures were removed her knee range of motion is 90 degrees 04/27/2018  9:27 AM  PATIENT:  Jo Hale  58 y.o. female  PRE-OPERATIVE DIAGNOSIS:  torn medial meniscus right knee  POST-OPERATIVE DIAGNOSIS: Torn medial meniscus right knee  PROCEDURE:  Procedure(s): RIGHT KNEE ARTHROSCOPY WITH MEDIAL MENISECTOMY (Right)-29881  SURGEON:  Surgeon(s) and Role:    Carole Civil, MD - Primary     Encounter Diagnoses  Name Primary?  . S/P right knee arthroscopy, medial menisectomy on 04/27/17   . Acute left-sided low back pain with left-sided sciatica Yes    Recommend postoperative exercises at home follow-up in 4 weeks  Postoperative plan (Work, [employer],  Meds ordered this encounter  Medications  . predniSONE (STERAPRED UNI-PAK 48 TAB) 10 MG (48) TBPK tablet    Sig: Take by mouth daily.    Dispense:  48 tablet    Refill:  1  . HYDROcodone-acetaminophen (NORCO/VICODIN) 5-325 MG tablet    Sig: Take 1 tablet by mouth every 4 (four) hours as needed for up to 7 days for moderate pain.    Dispense:  42 tablet    Refill:  0  ,FU)

## 2018-05-30 DIAGNOSIS — Z9889 Other specified postprocedural states: Secondary | ICD-10-CM | POA: Insufficient documentation

## 2018-05-31 ENCOUNTER — Other Ambulatory Visit (HOSPITAL_COMMUNITY): Payer: Self-pay | Admitting: Family Medicine

## 2018-06-02 ENCOUNTER — Encounter: Payer: Self-pay | Admitting: Orthopedic Surgery

## 2018-06-02 ENCOUNTER — Ambulatory Visit (INDEPENDENT_AMBULATORY_CARE_PROVIDER_SITE_OTHER): Payer: BLUE CROSS/BLUE SHIELD | Admitting: Orthopedic Surgery

## 2018-06-02 VITALS — BP 132/83 | HR 89 | Ht 62.0 in | Wt 245.0 lb

## 2018-06-02 DIAGNOSIS — Z9889 Other specified postprocedural states: Secondary | ICD-10-CM

## 2018-06-02 NOTE — Progress Notes (Signed)
Chief Complaint  Patient presents with  . Routine Post Op    right knee scope 04/27/18 better still c/o pain behind knee    58 years old had knee arthroscopy with meniscectomy on 16 January, she is doing well except for some intermittent pain behind the right knee however, she says she is feeling much better and doing many things that she could not do before  She has easy range of motion in her knee flexion extension mild tenderness in the calf behind the knee no popliteal swelling is noted neurovascular exam is intact leg shows no evidence of edema  Recommend continue gradual return to normal tibia, call us if needed 04/27/2018  9:27 AM  PATIENT:  Jo Hale  58 y.o. female  PRE-OPERATIVE DIAGNOSIS:  torn medial meniscus right knee  POST-OPERATIVE DIAGNOSIS: Torn medial meniscus right knee  PROCEDURE:  Procedure(s): RIGHT KNEE ARTHROSCOPY WITH MEDIAL MENISECTOMY (Right)-29881  SURGEON:  Surgeon(s) and Role:    Carole Civil, MD - Primary   FINDINGS: posterior horn tear medial meniscus  normal articular cartilage medial femoral condyle  mild chondral changes medial plateau. ACL PCL intact   Lateral compartment normal including meniscus and articular surfaces Trochlea normal Small grade 2 lesion just off the central ridge of the patella towards the lateral side

## 2018-06-12 ENCOUNTER — Telehealth: Payer: Self-pay | Admitting: Orthopedic Surgery

## 2018-06-12 NOTE — Telephone Encounter (Signed)
S/p Knee arthroscopy in Jan, is it okay for me to provide her with economy brace to try?

## 2018-06-12 NOTE — Telephone Encounter (Signed)
Jo Hale called and left a message on the voicemail.  She said that she feels like sometimes she needs a brace on her knee when it is bothering her.  She wants to know if we can give her a name of one that she can buy or do we have something that she can get.  Please give her a call at [phone]  Thanks

## 2018-06-12 NOTE — Telephone Encounter (Signed)
Yes   Going to be hard to fit her leg with thigh calf mismatch

## 2018-06-12 NOTE — Telephone Encounter (Signed)
I will let her know. I called left message for her to call me back to discuss.

## 2018-06-13 ENCOUNTER — Ambulatory Visit (HOSPITAL_COMMUNITY)
Admission: RE | Admit: 2018-06-13 | Discharge: 2018-06-13 | Disposition: A | Discharge status: Home / Self Care | Payer: BLUE CROSS/BLUE SHIELD | Source: Ambulatory Visit | Attending: Urology | Admitting: Urology

## 2018-06-13 ENCOUNTER — Other Ambulatory Visit (HOSPITAL_COMMUNITY): Payer: Self-pay | Admitting: Urology

## 2018-06-13 DIAGNOSIS — C641 Malignant neoplasm of right kidney, except renal pelvis: Secondary | ICD-10-CM

## 2018-06-13 DIAGNOSIS — C649 Malignant neoplasm of unspecified kidney, except renal pelvis: Secondary | ICD-10-CM | POA: Diagnosis not present

## 2018-06-13 NOTE — Telephone Encounter (Signed)
She will come by to try one on

## 2018-06-13 NOTE — Telephone Encounter (Signed)
We did not have one that fit her properly, so she was given an order to get one from [place]

## 2018-06-14 DIAGNOSIS — M1711 Unilateral primary osteoarthritis, right knee: Secondary | ICD-10-CM | POA: Diagnosis not present

## 2018-06-14 DIAGNOSIS — C641 Malignant neoplasm of right kidney, except renal pelvis: Secondary | ICD-10-CM | POA: Diagnosis not present

## 2018-06-21 DIAGNOSIS — Z85528 Personal history of other malignant neoplasm of kidney: Secondary | ICD-10-CM | POA: Diagnosis not present

## 2018-07-03 ENCOUNTER — Telehealth: Payer: Self-pay | Admitting: Family Medicine

## 2018-07-03 MED ORDER — ATORVASTATIN CALCIUM 20 MG PO TABS: 20.0000 mg | ORAL_TABLET | Freq: Every day | ORAL | 2 refills | Status: DC

## 2018-07-03 MED ORDER — TRIAMTERENE-HCTZ 37.5-25 MG PO TABS: 1.0000 | ORAL_TABLET | Freq: Every day | ORAL | 2 refills | Status: DC

## 2018-07-03 NOTE — Telephone Encounter (Signed)
Prescriptions sent electronically to pharmacy. Patient notified. °

## 2018-07-03 NOTE — Telephone Encounter (Signed)
°  She is due for her 6 month f/u on BP soon.  She is going to postpone that and said she is doing well and her blood pressure has been running normal.  Would like a refill on:   triamterene-hydrochlorothiazide (MAXZIDE-25) 37.5-25 MG tablet  atorvastatin (LIPITOR) 20 MG tablet   [employer]

## 2018-08-08 DIAGNOSIS — M47816 Spondylosis without myelopathy or radiculopathy, lumbar region: Secondary | ICD-10-CM | POA: Diagnosis not present

## 2018-08-17 DIAGNOSIS — M47816 Spondylosis without myelopathy or radiculopathy, lumbar region: Secondary | ICD-10-CM | POA: Diagnosis not present

## 2018-09-06 ENCOUNTER — Other Ambulatory Visit (HOSPITAL_COMMUNITY): Payer: Self-pay | Admitting: Adult Health

## 2018-09-06 DIAGNOSIS — Z1231 Encounter for screening mammogram for malignant neoplasm of breast: Secondary | ICD-10-CM

## 2018-09-07 ENCOUNTER — Ambulatory Visit (HOSPITAL_COMMUNITY)
Admission: RE | Admit: 2018-09-07 | Discharge: 2018-09-07 | Disposition: A | Discharge status: Home / Self Care | Payer: BLUE CROSS/BLUE SHIELD | Source: Ambulatory Visit | Attending: Adult Health | Admitting: Adult Health

## 2018-09-07 ENCOUNTER — Other Ambulatory Visit: Payer: Self-pay

## 2018-09-07 DIAGNOSIS — Z1231 Encounter for screening mammogram for malignant neoplasm of breast: Secondary | ICD-10-CM | POA: Diagnosis not present

## 2018-09-25 ENCOUNTER — Other Ambulatory Visit: Payer: Self-pay

## 2018-09-25 ENCOUNTER — Other Ambulatory Visit: Payer: BLUE CROSS/BLUE SHIELD

## 2018-09-25 ENCOUNTER — Other Ambulatory Visit: Payer: Self-pay | Admitting: Family Medicine

## 2018-09-25 DIAGNOSIS — Z20822 Contact with and (suspected) exposure to covid-19: Secondary | ICD-10-CM

## 2018-09-25 DIAGNOSIS — R6889 Other general symptoms and signs: Secondary | ICD-10-CM | POA: Diagnosis not present

## 2018-09-25 NOTE — Telephone Encounter (Signed)
Pt has been scheduled for 6/30.

## 2018-09-25 NOTE — Telephone Encounter (Signed)
Please schedule an appt with patient. Route back to nurses when appt scheduled. Thank you!!

## 2018-09-25 NOTE — Telephone Encounter (Signed)
One mo ea plus ov this mo

## 2018-09-26 LAB — NOVEL CORONAVIRUS, NAA: SARS-CoV-2, NAA: NOT DETECTED

## 2018-09-28 ENCOUNTER — Telehealth: Payer: Self-pay | Admitting: Family Medicine

## 2018-09-28 NOTE — Telephone Encounter (Signed)
Patient wants results of her COVID test she had done this week.

## 2018-09-28 NOTE — Telephone Encounter (Signed)
See result note. Pt was notified of results.

## 2018-09-29 ENCOUNTER — Telehealth: Payer: Self-pay | Admitting: Orthopedic Surgery

## 2018-09-29 ENCOUNTER — Other Ambulatory Visit: Payer: Self-pay | Admitting: Orthopedic Surgery

## 2018-09-29 ENCOUNTER — Telehealth: Payer: Self-pay | Admitting: Family Medicine

## 2018-09-29 DIAGNOSIS — R5383 Other fatigue: Secondary | ICD-10-CM

## 2018-09-29 DIAGNOSIS — E785 Hyperlipidemia, unspecified: Secondary | ICD-10-CM | POA: Diagnosis not present

## 2018-09-29 DIAGNOSIS — I10 Essential (primary) hypertension: Secondary | ICD-10-CM

## 2018-09-29 DIAGNOSIS — M5442 Lumbago with sciatica, left side: Secondary | ICD-10-CM

## 2018-09-29 DIAGNOSIS — M5440 Lumbago with sciatica, unspecified side: Secondary | ICD-10-CM

## 2018-09-29 DIAGNOSIS — Z79899 Other long term (current) drug therapy: Secondary | ICD-10-CM | POA: Diagnosis not present

## 2018-09-29 MED ORDER — GABAPENTIN 100 MG PO CAPS: 100.0000 mg | ORAL_CAPSULE | Freq: Three times a day (TID) | ORAL | 2 refills | Status: DC

## 2018-09-29 MED ORDER — METHOCARBAMOL 500 MG PO TABS: 500.0000 mg | ORAL_TABLET | Freq: Three times a day (TID) | ORAL | 1 refills | Status: DC

## 2018-09-29 MED ORDER — PREDNISONE 10 MG (48) PO TBPK: ORAL_TABLET | Freq: Every day | ORAL | 0 refills | Status: DC

## 2018-09-29 NOTE — Telephone Encounter (Signed)
Thank you :)

## 2018-09-29 NOTE — Telephone Encounter (Signed)
I agree with swelling mainly behind the knee off and on and hx of knee surg earlier this yr sounds like a knee inflammation issue. When this goes on it can cause a little swellig to0 occur at the ankle off and on. Will do blood work prior to visit cbc lip lviv m7 tsh A1c

## 2018-09-29 NOTE — Telephone Encounter (Signed)
With foot swelling definitely need much more info from nurse to make sure not an acute important issue (I e clot or infxn etc)

## 2018-09-29 NOTE — Telephone Encounter (Signed)
Pt contacted and informed that hx of knee surgery could cause a little swelling to occur at the ankle off an on. Lab orders placed and pt verbalized understanding

## 2018-09-29 NOTE — Telephone Encounter (Signed)
Patient called saying that she is having a lot of swelling in the back of her R knee/leg. It swells real bad at night, uses ice and by morning swelling is almost gone. When it is swollen she is having some numbness at times, but also having a pain running down the back of her leg that is like a toothache. I suggest she may want to come in and she wanted to first see what Dr. Aline Brochure suggests. She is s/p R Knee Arthroscopy with medial menisectomy DOS 04/27/18. Last appointment was 06/02/18.    Please advise

## 2018-09-29 NOTE — Telephone Encounter (Signed)
Please advise. Thank you

## 2018-09-29 NOTE — Telephone Encounter (Signed)
Yes, we need to see her, please. To Dr Bill Salinas

## 2018-09-29 NOTE — Telephone Encounter (Signed)
LMRC

## 2018-09-29 NOTE — Telephone Encounter (Signed)
I spoke with patient and let her know that Dr. Aline Brochure is sending in some medications for her. I told her to take they as prescribed and see if they help, if not please call and we will get her scheduled to be seen.

## 2018-09-29 NOTE — Telephone Encounter (Signed)
Pt is having some swelling in her right foot and would like her lab work re checked.   She has a virtual follow up on 6/30

## 2018-09-29 NOTE — Telephone Encounter (Signed)
Pt states this has been going about a week. Contacted Dr.Harrison due to back of knee swelling at night and Dr.Harrison did knee surgery recently. Contacted Dr.Harrison because he had released her. Not tender to touch, not warm to touch. Does not radiate. Pt states she has tried elevating foot. Pt can move and walk on foot ok. Pt states she is wanting to know if she needs blood work done before her visit on 10/10/2018. Pt states the swelling is not constant, swelling comes and goes. Please advise. Thank you

## 2018-09-29 NOTE — Telephone Encounter (Signed)
I M GOING TO CALL IN SOME MEDICATION FOR HER AND SEE HOW SHE DOES SOUNDS LIKE A BACK ISSUE

## 2018-09-30 LAB — LIPID PANEL
Chol/HDL Ratio: 2.5 ratio (ref 0.0–4.4)
Cholesterol, Total: 142 mg/dL (ref 100–199)
HDL: 56 mg/dL (ref >39)
LDL Calculated: 73 mg/dL (ref 0–99)
Triglycerides: 67 mg/dL (ref 0–149)
VLDL Cholesterol Cal: 13 mg/dL (ref 5–40)

## 2018-09-30 LAB — BASIC METABOLIC PANEL
BUN/Creatinine Ratio: 12 (ref 9–23)
BUN: 13 mg/dL (ref 6–24)
CO2: 26 mmol/L (ref 20–29)
Calcium: 9 mg/dL (ref 8.7–10.2)
Chloride: 105 mmol/L (ref 96–106)
Creatinine, Ser: 1.11 mg/dL — ABNORMAL HIGH (ref 0.57–1.00)
GFR calc Af Amer: 64 mL/min/{1.73_m2} (ref >59)
GFR calc non Af Amer: 55 mL/min/{1.73_m2} — ABNORMAL LOW (ref >59)
Glucose: 89 mg/dL (ref 65–99)
Potassium: 4.1 mmol/L (ref 3.5–5.2)
Sodium: 143 mmol/L (ref 134–144)

## 2018-09-30 LAB — CBC WITH DIFFERENTIAL/PLATELET
Basophils Absolute: 0 10*3/uL (ref 0.0–0.2)
Basos: 1 %
EOS (ABSOLUTE): 0.1 10*3/uL (ref 0.0–0.4)
Eos: 2 %
Hematocrit: 36.6 % (ref 34.0–46.6)
Hemoglobin: 12.2 g/dL (ref 11.1–15.9)
Immature Grans (Abs): 0 10*3/uL (ref 0.0–0.1)
Immature Granulocytes: 0 %
Lymphocytes Absolute: 2.1 10*3/uL (ref 0.7–3.1)
Lymphs: 46 %
MCH: 30.3 pg (ref 26.6–33.0)
MCHC: 33.3 g/dL (ref 31.5–35.7)
MCV: 91 fL (ref 79–97)
Monocytes Absolute: 0.5 10*3/uL (ref 0.1–0.9)
Monocytes: 10 %
Neutrophils Absolute: 1.9 10*3/uL (ref 1.4–7.0)
Neutrophils: 41 %
Platelets: 238 10*3/uL (ref 150–450)
RBC: 4.02 x10E6/uL (ref 3.77–5.28)
RDW: 11.8 % (ref 11.7–15.4)
WBC: 4.7 10*3/uL (ref 3.4–10.8)

## 2018-09-30 LAB — HEPATIC FUNCTION PANEL
ALT: 30 IU/L (ref 0–32)
AST: 32 IU/L (ref 0–40)
Albumin: 3.9 g/dL (ref 3.8–4.9)
Alkaline Phosphatase: 92 IU/L (ref 39–117)
Bilirubin Total: 0.4 mg/dL (ref 0.0–1.2)
Bilirubin, Direct: 0.12 mg/dL (ref 0.00–0.40)
Total Protein: 6.5 g/dL (ref 6.0–8.5)

## 2018-09-30 LAB — HEMOGLOBIN A1C
Est. average glucose Bld gHb Est-mCnc: 114 mg/dL
Hgb A1c MFr Bld: 5.6 % (ref 4.8–5.6)

## 2018-09-30 LAB — TSH: TSH: 1.77 u[IU]/mL (ref 0.450–4.500)

## 2018-10-10 ENCOUNTER — Encounter: Payer: Self-pay | Admitting: Family Medicine

## 2018-10-10 ENCOUNTER — Ambulatory Visit (INDEPENDENT_AMBULATORY_CARE_PROVIDER_SITE_OTHER): Payer: BC Managed Care – PPO | Admitting: Family Medicine

## 2018-10-10 ENCOUNTER — Other Ambulatory Visit: Payer: Self-pay

## 2018-10-10 DIAGNOSIS — E785 Hyperlipidemia, unspecified: Secondary | ICD-10-CM | POA: Diagnosis not present

## 2018-10-10 DIAGNOSIS — M1611 Unilateral primary osteoarthritis, right hip: Secondary | ICD-10-CM | POA: Diagnosis not present

## 2018-10-10 DIAGNOSIS — I10 Essential (primary) hypertension: Secondary | ICD-10-CM

## 2018-10-10 MED ORDER — TRIAMTERENE-HCTZ 37.5-25 MG PO TABS: 1.0000 | ORAL_TABLET | Freq: Every day | ORAL | 5 refills | Status: DC

## 2018-10-10 MED ORDER — PANTOPRAZOLE SODIUM 40 MG PO TBEC: 40.0000 mg | DELAYED_RELEASE_TABLET | ORAL | 5 refills | Status: DC | PRN

## 2018-10-10 MED ORDER — ATORVASTATIN CALCIUM 20 MG PO TABS: 20.0000 mg | ORAL_TABLET | Freq: Every day | ORAL | 5 refills | Status: DC

## 2018-10-10 NOTE — Progress Notes (Signed)
Subjective:  Audio only  Patient ID: Jo Hale, female    DOB: [birth year], 58 y.o.   MRN: [id number] Audio plus video Hyperlipidemia This is a chronic problem. The current episode started more than 1 year ago. Hypothyroidism:   Compliance problems: takes meds every day, eats healthy, little exercise due to knee pain and swelling. seeing dr Aline Brochure for knee.     Having trouble holding your urine. Started about one to two weeks ago. Has noticied it is more when she drinks green tea.  Patient not interested in starting medications at this time.  Taking a otc pain patch for back pain. Wants to make sure it is ok to take with all her other meds.  Using topical anesthetic patches.  Wonders if she can with her current meds  Virtual Visit via Video Note  I connected with Jo Hale on 10/10/18 at  9:30 AM EDT by a video enabled telemedicine application and verified that I am speaking with the correct person using two identifiers.  Location: Patient: home Provider: office   I discussed the limitations of evaluation and management by telemedicine and the availability of in person appointments. The patient expressed understanding and agreed to proceed.  History of Present Illness:    Results for orders placed or performed in visit on 09/29/18  CBC with Differential  Result Value Ref Range   WBC 4.7 3.4 - 10.8 x10E3/uL   RBC 4.02 3.77 - 5.28 x10E6/uL   Hemoglobin 12.2 11.1 - 15.9 g/dL   Hematocrit 36.6 34.0 - 46.6 %   MCV 91 79 - 97 fL   MCH 30.3 26.6 - 33.0 pg   MCHC 33.3 31.5 - 35.7 g/dL   RDW 11.8 11.7 - 15.4 %   Platelets 238 150 - 450 x10E3/uL   Neutrophils 41 Not Estab. %   Lymphs 46 Not Estab. %   Monocytes 10 Not Estab. %   Eos 2 Not Estab. %   Basos 1 Not Estab. %   Neutrophils Absolute 1.9 1.4 - 7.0 x10E3/uL   Lymphocytes Absolute 2.1 0.7 - 3.1 x10E3/uL   Monocytes Absolute 0.5 0.1 - 0.9 x10E3/uL   EOS (ABSOLUTE) 0.1 0.0 - 0.4 x10E3/uL   Basophils  Absolute 0.0 0.0 - 0.2 x10E3/uL   Immature Granulocytes 0 Not Estab. %   Immature Grans (Abs) 0.0 0.0 - 0.1 x10E3/uL  Lipid Profile  Result Value Ref Range   Cholesterol, Total 142 100 - 199 mg/dL   Triglycerides 67 0 - 149 mg/dL   HDL 56 >39 mg/dL   VLDL Cholesterol Cal 13 5 - 40 mg/dL   LDL Calculated 73 0 - 99 mg/dL   Chol/HDL Ratio 2.5 0.0 - 4.4 ratio  Hepatic function panel  Result Value Ref Range   Total Protein 6.5 6.0 - 8.5 g/dL   Albumin 3.9 3.8 - 4.9 g/dL   Bilirubin Total 0.4 0.0 - 1.2 mg/dL   Bilirubin, Direct 0.12 0.00 - 0.40 mg/dL   Alkaline Phosphatase 92 39 - 117 IU/L   AST 32 0 - 40 IU/L   ALT 30 0 - 32 IU/L  Basic Metabolic Panel (BMET)  Result Value Ref Range   Glucose 89 65 - 99 mg/dL   BUN 13 6 - 24 mg/dL   Creatinine, Ser 1.11 (H) 0.57 - 1.00 mg/dL   GFR calc non Af Amer 55 (L) >59 mL/min/1.73   GFR calc Af Amer 64 >59 mL/min/1.73   BUN/Creatinine Ratio 12 9 - 23  Sodium 143 134 - 144 mmol/L   Potassium 4.1 3.5 - 5.2 mmol/L   Chloride 105 96 - 106 mmol/L   CO2 26 20 - 29 mmol/L   Calcium 9.0 8.7 - 10.2 mg/dL  TSH  Result Value Ref Range   TSH 1.770 0.450 - 4.500 uIU/mL  Hemoglobin A1c  Result Value Ref Range   Hgb A1c MFr Bld 5.6 4.8 - 5.6 %   Est. average glucose Bld gHb Est-mCnc 114 mg/dL    Observations/Objective:   Assessment and Plan:   Follow Up Instructions:    I discussed the assessment and treatment plan with the patient. The patient was provided an opportunity to ask questions and all were answered. The patient agreed with the plan and demonstrated an understanding of the instructions.   The patient was advised to call back or seek an in-person evaluation if the symptoms worsen or if the condition fails to improve as anticipated.  I provided *20minutes of non-face-to-face time during this encounter.  Blood pressure medicine and blood pressure levels reviewed today with patient. Compliant with blood pressure medicine. States  does not miss a dose. No obvious side effects. Blood pressure generally good when checked elsewhere. Watching salt intake.   Patient continues to take lipid medication regularly. No obvious side effects from it. Generally does not miss a dose. Prior blood work results are reviewed with patient. Patient continues to work on fat intake in diet   Review of Systems No headache, no major weight loss or weight gain, no chest pain no back pain abdominal pain no change in bowel habits complete ROS otherwise negative     Objective:   Physical Exam  Virtual      Assessment & Plan:  Impression hyperlipidemia blood work reviewed.  Good control discussed maintain same meds  2.  Hypertension.  Blood pressure good when checked elsewhere discussed maintain same meds  3.  Chronic back pain.  Discussed.  Okay to use topical agents  4.  Incontinence.  Patient not interested in major work-up at this time discussed

## 2018-11-08 ENCOUNTER — Telehealth: Payer: Self-pay | Admitting: Family Medicine

## 2018-11-08 ENCOUNTER — Other Ambulatory Visit: Payer: Self-pay | Admitting: *Deleted

## 2018-11-08 MED ORDER — HYDROCORTISONE 2.5 % EX CREA: TOPICAL_CREAM | CUTANEOUS | 0 refills | Status: DC

## 2018-11-08 NOTE — Telephone Encounter (Signed)
Rash for 4 days. Little fine bumps all around neck and shoulders, bumps are skin colored until she scratches them then they are red. No fever. Has tried silver sol which is a natural gel she states. Advised pt she may need a virtual appt. No appts available today

## 2018-11-08 NOTE — Telephone Encounter (Signed)
I am okay with trying hydrocortisone 2.5% cream apply twice daily for up to 7 days if ongoing troubles will need visit, 45 g tube apply thin amount twice daily when needed

## 2018-11-08 NOTE — Telephone Encounter (Signed)
Discussed with pt and med sent to pharm.  

## 2018-11-08 NOTE — Telephone Encounter (Signed)
Left message to return call to get more info.  

## 2018-11-08 NOTE — Telephone Encounter (Signed)
Patient has had rash on neck for 4 days and wanting something called into [place].

## 2018-12-11 DIAGNOSIS — E539 Vitamin B deficiency, unspecified: Secondary | ICD-10-CM | POA: Diagnosis not present

## 2018-12-11 DIAGNOSIS — M488X9 Other specified spondylopathies, site unspecified: Secondary | ICD-10-CM | POA: Diagnosis not present

## 2018-12-11 DIAGNOSIS — M2550 Pain in unspecified joint: Secondary | ICD-10-CM | POA: Diagnosis not present

## 2018-12-11 DIAGNOSIS — Z79899 Other long term (current) drug therapy: Secondary | ICD-10-CM | POA: Diagnosis not present

## 2018-12-11 DIAGNOSIS — E559 Vitamin D deficiency, unspecified: Secondary | ICD-10-CM | POA: Diagnosis not present

## 2019-02-01 DIAGNOSIS — Z85528 Personal history of other malignant neoplasm of kidney: Secondary | ICD-10-CM | POA: Diagnosis not present

## 2019-02-08 ENCOUNTER — Other Ambulatory Visit: Payer: Self-pay

## 2019-02-08 ENCOUNTER — Ambulatory Visit (HOSPITAL_COMMUNITY)
Admission: RE | Admit: 2019-02-08 | Discharge: 2019-02-08 | Disposition: A | Discharge status: Home / Self Care | Payer: BC Managed Care – PPO | Source: Ambulatory Visit | Attending: Urology | Admitting: Urology

## 2019-02-08 ENCOUNTER — Other Ambulatory Visit (HOSPITAL_COMMUNITY): Payer: Self-pay | Admitting: Urology

## 2019-02-08 DIAGNOSIS — Z85528 Personal history of other malignant neoplasm of kidney: Secondary | ICD-10-CM | POA: Diagnosis not present

## 2019-02-14 DIAGNOSIS — Z85528 Personal history of other malignant neoplasm of kidney: Secondary | ICD-10-CM | POA: Diagnosis not present

## 2019-03-14 ENCOUNTER — Ambulatory Visit: Payer: 59 | Admitting: Orthopedic Surgery

## 2019-03-19 IMAGING — MR MR KNEE*R* W/O CM
4 of 6 series · 16 of 40 positions shown · non-contrast
Comparison: None.

CLINICAL DATA: Right knee pain status post fall 1 month ago

EXAM:
MRI OF THE RIGHT KNEE WITHOUT CONTRAST
TECHNIQUE: Multiplanar, multisequence MR imaging of the knee was performed. No
intravenous contrast was administered.

[Series 3: t2fs axial · axial · 4.0mm · 0.22mm/px · z∈[-29,+86]mm · 4 of 24 slices shown]
[im 1/24]
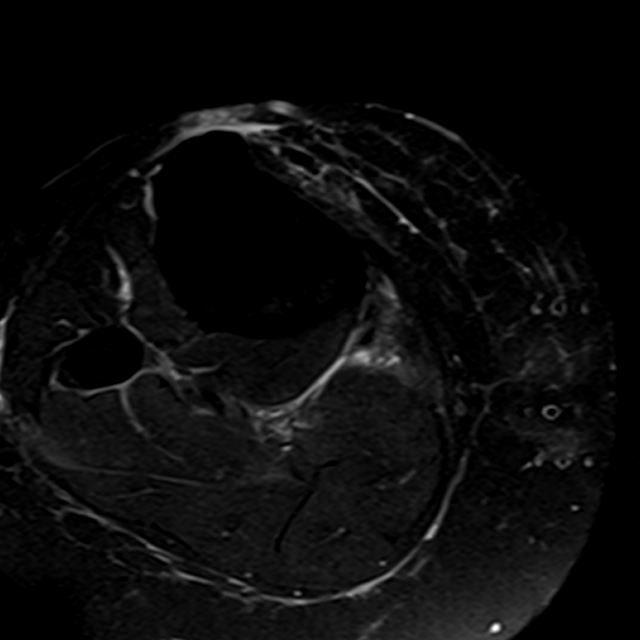
[im 6/24]
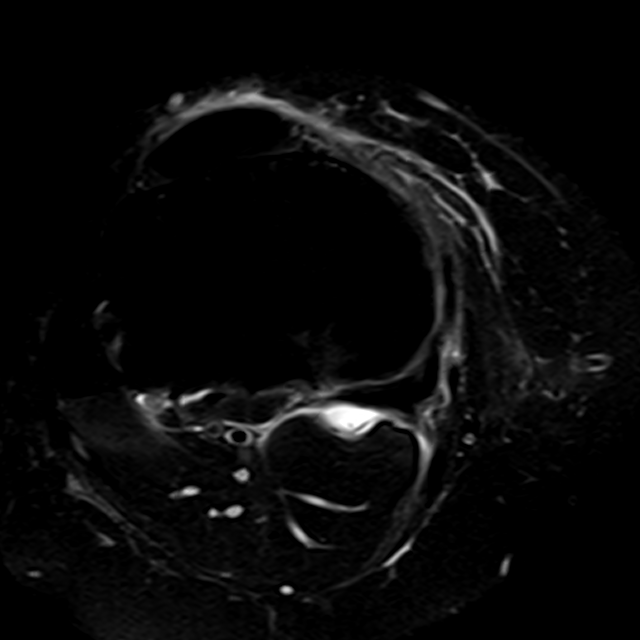
[im 12/24]
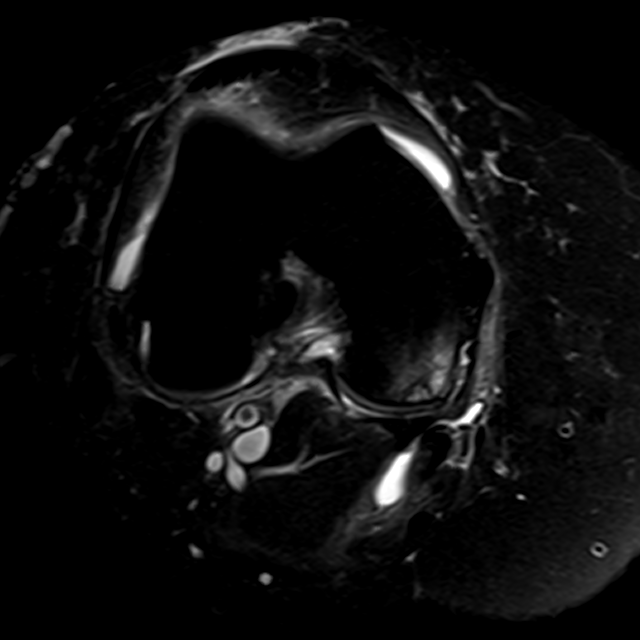
[im 24/24]
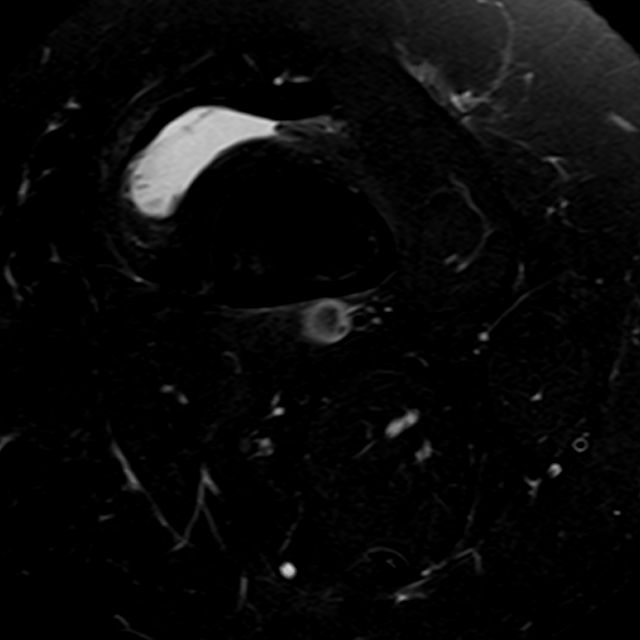

[Series 4: T1 · coronal · 4.0mm · 0.29mm/px · 6 of 28 slices shown]
[im 1/28]
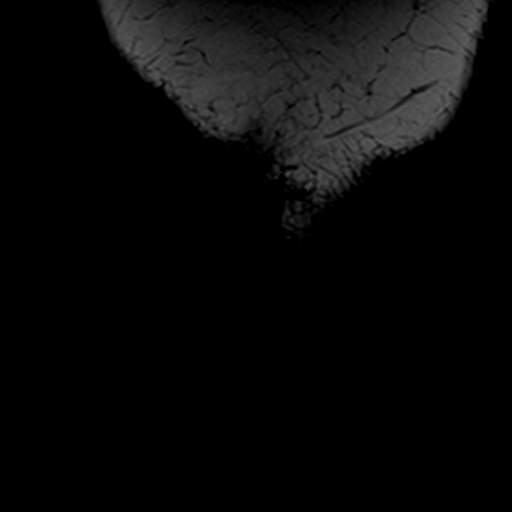
[im 6/28]
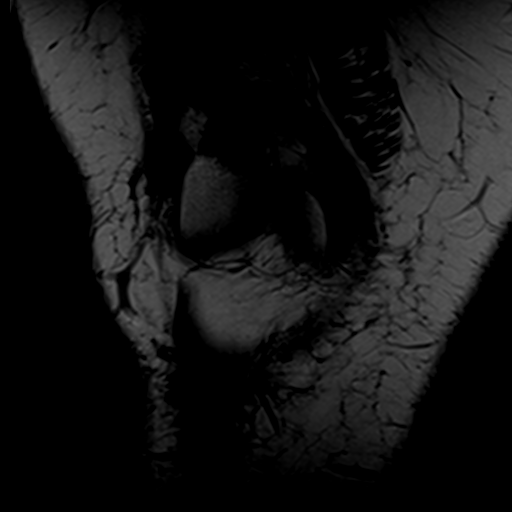
[im 11/28]
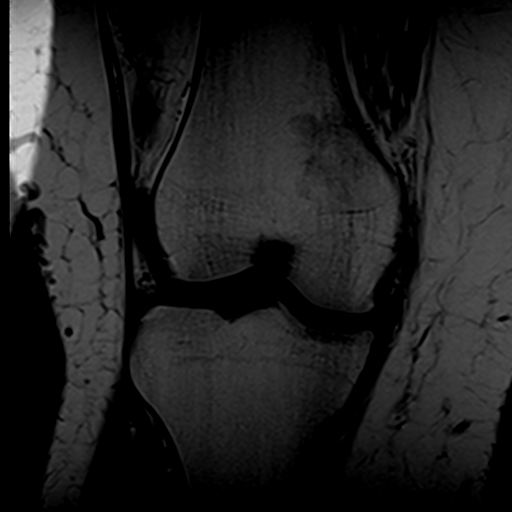
[im 17/28]
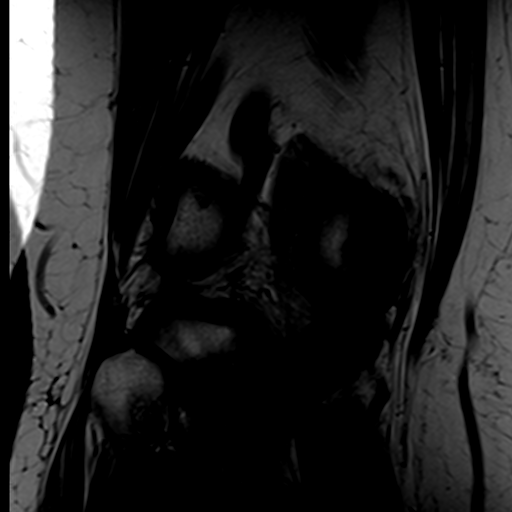
[im 22/28]
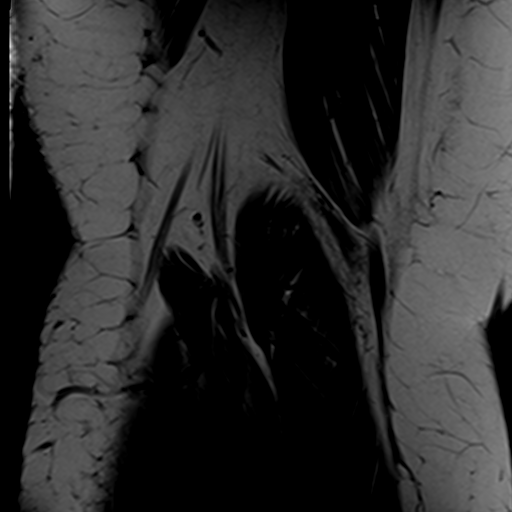
[im 28/28]
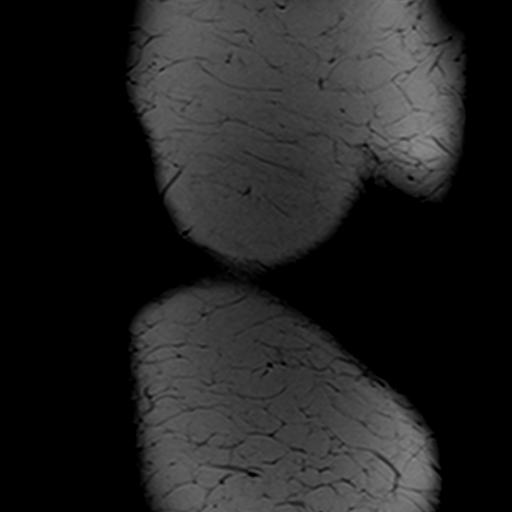

[Series 5: pdfs sag · sagittal · 3.0mm · 0.23mm/px · 3 of 29 slices shown]
[im 5/29]
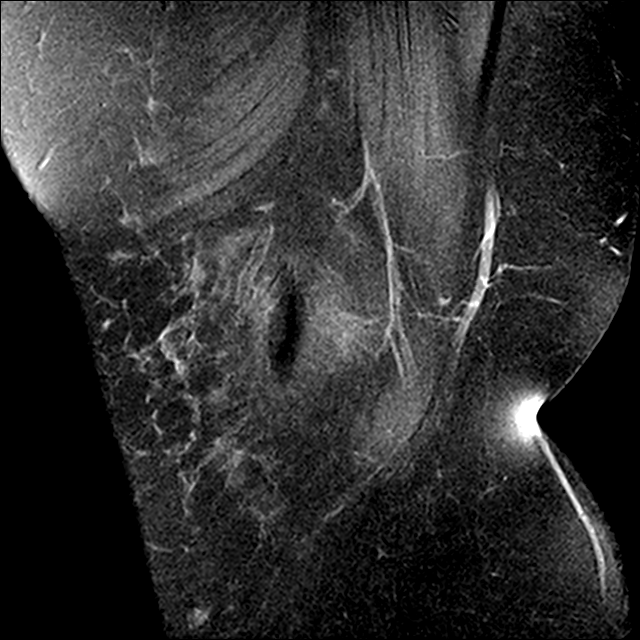
[im 15/29]
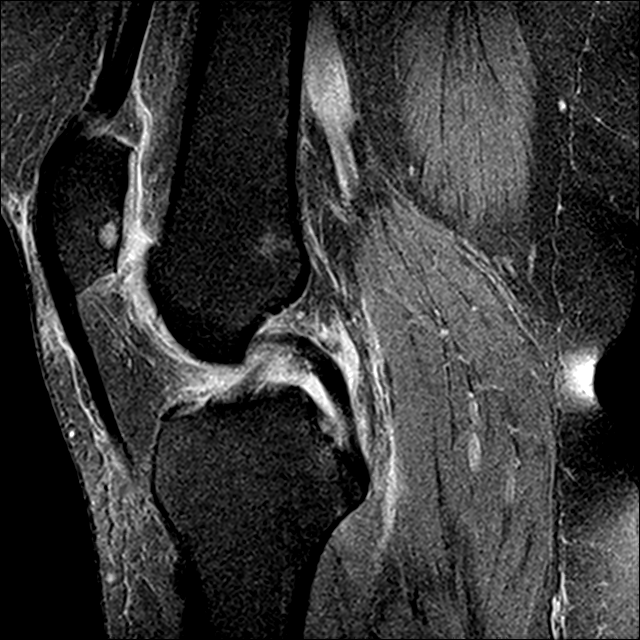
[im 24/29]
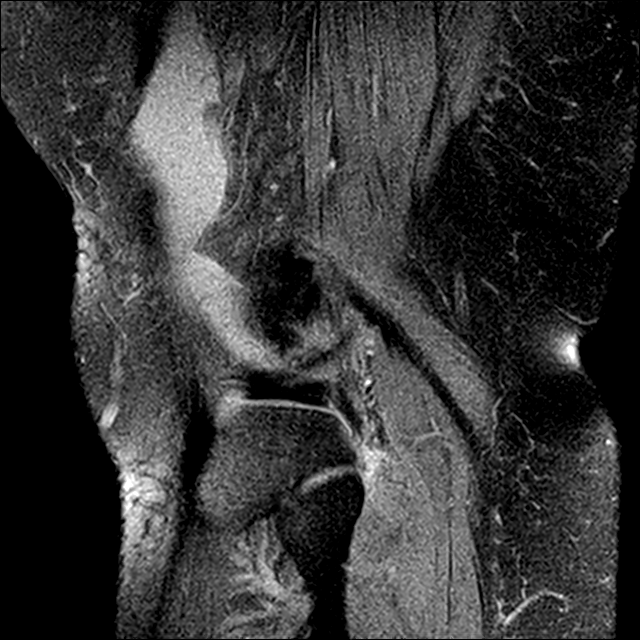

[Series 6: t2fs cor · coronal · 4.0mm · 0.30mm/px · 3 of 30 slices shown]
[im 5/30]
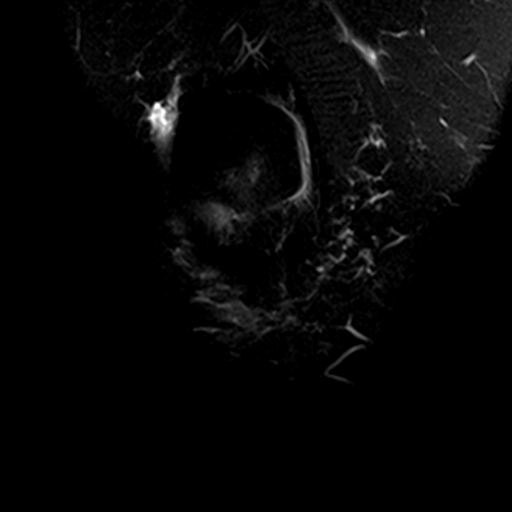
[im 15/30]
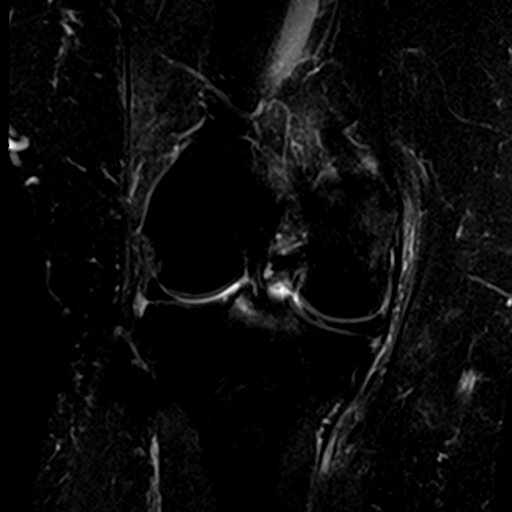
[im 25/30]
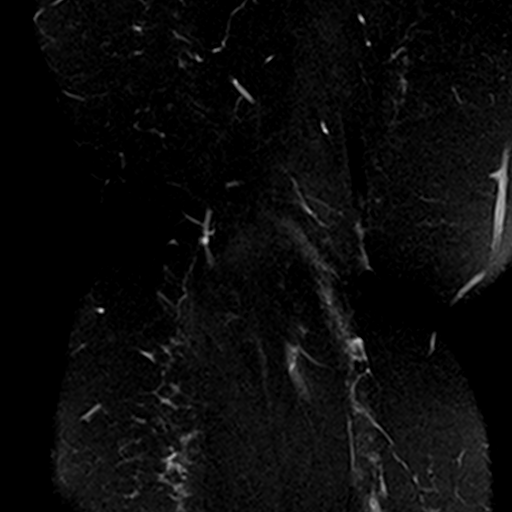

[16 of 40 positions shown; findings below may reference images not displayed]

FINDINGS: MENISCI

Medial meniscus: Mildly complex tear of the posterior horn of the
medial meniscus with peripheral meniscal extrusion.

Lateral meniscus:  Intact.

LIGAMENTS

Cruciates:  Intact ACL and PCL.

Collaterals: Medial collateral ligament is intact. Lateral
collateral ligament complex is intact.

CARTILAGE

Patellofemoral: Full-thickness cartilage fissuring of the patellar
apex with subchondral reactive marrow changes. Partial-thickness
cartilage loss of lateral trochlea. Partial-thickness cartilage loss
of the lateral patellar facet with subchondral reactive marrow
changes.

Medial: Partial-thickness cartilage loss of the medial femorotibial
compartment.

Lateral:  No chondral defect.

Joint: Moderate joint effusion. Mild edema in superolateral Hoffa's
fat. No plical thickening.

Popliteal Fossa:  Small Baker's cyst.  Intact popliteus tendon.

Extensor Mechanism: Intact quadriceps tendon. Intact patellar
tendon. Intact medial patellar retinaculum. Intact lateral patellar
retinaculum. Intact MPFL.

Bones:  No acute osseous abnormality.  No aggressive osseous lesion.

Other: No fluid collection or hematoma.
IMPRESSION: 1. Mildly complex tear of the posterior horn of the medial meniscus
with peripheral meniscal extrusion.
2. Cartilage abnormalities of the patellofemoral compartment and
medial femorotibial compartment as described above.
3. Moderate joint effusion.

## 2019-05-16 ENCOUNTER — Encounter: Payer: Self-pay | Admitting: Family Medicine

## 2019-05-21 ENCOUNTER — Telehealth: Payer: Self-pay | Admitting: Family Medicine

## 2019-05-21 DIAGNOSIS — Z79899 Other long term (current) drug therapy: Secondary | ICD-10-CM

## 2019-05-21 DIAGNOSIS — I10 Essential (primary) hypertension: Secondary | ICD-10-CM

## 2019-05-21 DIAGNOSIS — R5383 Other fatigue: Secondary | ICD-10-CM

## 2019-05-21 DIAGNOSIS — E785 Hyperlipidemia, unspecified: Secondary | ICD-10-CM

## 2019-05-21 NOTE — Telephone Encounter (Signed)
Last labs 09/2018: HgbA1c, TSH, Met 7, Lipid, Liver, CBC

## 2019-05-21 NOTE — Telephone Encounter (Signed)
Patient has appointment for 6 month follow up on 2/12 and would like to get labs done.

## 2019-05-22 NOTE — Telephone Encounter (Signed)
Repeat all minus A1c

## 2019-05-22 NOTE — Telephone Encounter (Signed)
Pt is calling checking on lab work

## 2019-05-22 NOTE — Telephone Encounter (Signed)
Blood work ordered in Epic. Patient notified. 

## 2019-05-23 DIAGNOSIS — Z79899 Other long term (current) drug therapy: Secondary | ICD-10-CM | POA: Diagnosis not present

## 2019-05-23 DIAGNOSIS — E785 Hyperlipidemia, unspecified: Secondary | ICD-10-CM | POA: Diagnosis not present

## 2019-05-23 DIAGNOSIS — R5383 Other fatigue: Secondary | ICD-10-CM | POA: Diagnosis not present

## 2019-05-23 DIAGNOSIS — I10 Essential (primary) hypertension: Secondary | ICD-10-CM | POA: Diagnosis not present

## 2019-05-24 LAB — BASIC METABOLIC PANEL
BUN/Creatinine Ratio: 13 (ref 9–23)
BUN: 17 mg/dL (ref 6–24)
CO2: 23 mmol/L (ref 20–29)
Calcium: 9.2 mg/dL (ref 8.7–10.2)
Chloride: 104 mmol/L (ref 96–106)
Creatinine, Ser: 1.33 mg/dL — ABNORMAL HIGH (ref 0.57–1.00)
GFR calc Af Amer: 51 mL/min/{1.73_m2} — ABNORMAL LOW (ref >59)
GFR calc non Af Amer: 44 mL/min/{1.73_m2} — ABNORMAL LOW (ref >59)
Glucose: 89 mg/dL (ref 65–99)
Potassium: 4.1 mmol/L (ref 3.5–5.2)
Sodium: 142 mmol/L (ref 134–144)

## 2019-05-24 LAB — LIPID PANEL
Chol/HDL Ratio: 2.2 ratio (ref 0.0–4.4)
Cholesterol, Total: 148 mg/dL (ref 100–199)
HDL: 67 mg/dL (ref >39)
LDL Chol Calc (NIH): 67 mg/dL (ref 0–99)
Triglycerides: 73 mg/dL (ref 0–149)
VLDL Cholesterol Cal: 14 mg/dL (ref 5–40)

## 2019-05-24 LAB — CBC WITH DIFFERENTIAL/PLATELET
Basophils Absolute: 0 10*3/uL (ref 0.0–0.2)
Basos: 1 %
EOS (ABSOLUTE): 0.1 10*3/uL (ref 0.0–0.4)
Eos: 3 %
Hematocrit: 39.8 % (ref 34.0–46.6)
Hemoglobin: 13.3 g/dL (ref 11.1–15.9)
Immature Grans (Abs): 0 10*3/uL (ref 0.0–0.1)
Immature Granulocytes: 0 %
Lymphocytes Absolute: 1.6 10*3/uL (ref 0.7–3.1)
Lymphs: 40 %
MCH: 31.3 pg (ref 26.6–33.0)
MCHC: 33.4 g/dL (ref 31.5–35.7)
MCV: 94 fL (ref 79–97)
Monocytes Absolute: 0.4 10*3/uL (ref 0.1–0.9)
Monocytes: 9 %
Neutrophils Absolute: 2 10*3/uL (ref 1.4–7.0)
Neutrophils: 47 %
Platelets: 234 10*3/uL (ref 150–450)
RBC: 4.25 x10E6/uL (ref 3.77–5.28)
RDW: 12.8 % (ref 11.7–15.4)
WBC: 4.1 10*3/uL (ref 3.4–10.8)

## 2019-05-24 LAB — HEPATIC FUNCTION PANEL
ALT: 46 IU/L — ABNORMAL HIGH (ref 0–32)
AST: 33 IU/L (ref 0–40)
Albumin: 4 g/dL (ref 3.8–4.9)
Alkaline Phosphatase: 118 IU/L — ABNORMAL HIGH (ref 39–117)
Bilirubin Total: 0.4 mg/dL (ref 0.0–1.2)
Bilirubin, Direct: 0.1 mg/dL (ref 0.00–0.40)
Total Protein: 6.7 g/dL (ref 6.0–8.5)

## 2019-05-24 LAB — TSH: TSH: 1.87 u[IU]/mL (ref 0.450–4.500)

## 2019-05-25 ENCOUNTER — Ambulatory Visit (INDEPENDENT_AMBULATORY_CARE_PROVIDER_SITE_OTHER): Payer: Medicare Other | Admitting: Family Medicine

## 2019-05-25 DIAGNOSIS — E785 Hyperlipidemia, unspecified: Secondary | ICD-10-CM

## 2019-05-25 DIAGNOSIS — I10 Essential (primary) hypertension: Secondary | ICD-10-CM

## 2019-05-25 MED ORDER — ATORVASTATIN CALCIUM 20 MG PO TABS: 20.0000 mg | ORAL_TABLET | Freq: Every day | ORAL | 5 refills | Status: DC

## 2019-05-25 MED ORDER — TRIAMTERENE-HCTZ 37.5-25 MG PO TABS: 1.0000 | ORAL_TABLET | Freq: Every day | ORAL | 5 refills | Status: DC

## 2019-05-25 MED ORDER — PANTOPRAZOLE SODIUM 40 MG PO TBEC: 40.0000 mg | DELAYED_RELEASE_TABLET | ORAL | 5 refills | Status: DC | PRN

## 2019-05-25 NOTE — Progress Notes (Signed)
   Subjective:  Audio only  Patient ID: Jo Hale, female    DOB: 1961/02/23, 59 y.o.   MRN: [id number]  HPI Pt here for 6 month follow up. Pt is taking medication as prescribed. States everything is going ok. No issues with blood pressure. Pt states that she is still having constant back pain. Also having the tingling sensation in her mouth again. The tingling happens about 3-4 times a week. Pt has been taking Hydrocodone for back pain but would like a milder pain pill if possible. Pt also states that each time she takes her Vit D pill, she breaks out behind her ears.   Virtual Visit via Video Note  I connected with Jo Hale on 05/25/19 at  8:30 AM EST by a video enabled telemedicine application and verified that I am speaking with the correct person using two identifiers.  Location: Patient: home Provider: office   I discussed the limitations of evaluation and management by telemedicine and the availability of in person appointments. The patient expressed understanding and agreed to proceed.  History of Present Illness:    Observations/Objective:   Assessment and Plan:   Follow Up Instructions:    I discussed the assessment and treatment plan with the patient. The patient was provided an opportunity to ask questions and all were answered. The patient agreed with the plan and demonstrated an understanding of the instructions.   The patient was advised to call back or seek an in-person evaluation if the symptoms worsen or if the condition fails to improve as anticipated.  I provided 22 minutes of non-face-to-face time during this encounter.   Blood pressure medicine and blood pressure levels reviewed today with patient. Compliant with blood pressure medicine. States does not miss a dose. No obvious side effects. Blood pressure generally good when checked elsewhere. Watching salt intake.      Review of Systems No headache no chest pain no shortness of  breath    Objective:   Physical Exam  Virtual      Assessment & Plan:  Impression hypertension.  Blood pressure good when checked elsewhere.  Compliant with medication.  Watching salt intake.  2.  Chronic back pain.  Currently working with a specialist on this.  3.  History of intermittent burning of the tongue.  Not bad enough to see a specialist at this time   4.  Hyperlipidemia.  Numbers excellent last time.  Discussed to maintain same Medications refilled diet exercise discussed

## 2019-06-08 DIAGNOSIS — R7989 Other specified abnormal findings of blood chemistry: Secondary | ICD-10-CM | POA: Diagnosis not present

## 2019-06-08 DIAGNOSIS — M5136 Other intervertebral disc degeneration, lumbar region: Secondary | ICD-10-CM | POA: Diagnosis not present

## 2019-06-08 DIAGNOSIS — M47816 Spondylosis without myelopathy or radiculopathy, lumbar region: Secondary | ICD-10-CM | POA: Diagnosis not present

## 2019-06-25 DIAGNOSIS — Z0100 Encounter for examination of eyes and vision without abnormal findings: Secondary | ICD-10-CM | POA: Diagnosis not present

## 2019-06-26 ENCOUNTER — Telehealth: Payer: Self-pay | Admitting: Family Medicine

## 2019-06-26 NOTE — Telephone Encounter (Signed)
Patient would like her medications switched to Medicare RX where she can get them cheaper.. Her new insurance is  [facility] and would like medication list sent over to them to get filled [phone] and phone is [phone] please adviise

## 2019-06-27 ENCOUNTER — Other Ambulatory Visit: Payer: Self-pay | Admitting: *Deleted

## 2019-06-27 MED ORDER — FLUTICASONE PROPIONATE 50 MCG/ACT NA SUSP: 2.0000 | Freq: Every day | NASAL | 1 refills | Status: DC

## 2019-06-27 MED ORDER — ATORVASTATIN CALCIUM 20 MG PO TABS: 20.0000 mg | ORAL_TABLET | Freq: Every day | ORAL | 1 refills | Status: DC

## 2019-06-27 MED ORDER — TRIAMTERENE-HCTZ 37.5-25 MG PO TABS: 1.0000 | ORAL_TABLET | Freq: Every day | ORAL | 1 refills | Status: DC

## 2019-06-27 MED ORDER — PANTOPRAZOLE SODIUM 40 MG PO TBEC: 40.0000 mg | DELAYED_RELEASE_TABLET | ORAL | 1 refills | Status: DC | PRN

## 2019-06-27 NOTE — Telephone Encounter (Signed)
Refills sent per doctor and left a message for pt to return call to notify

## 2019-06-27 NOTE — Telephone Encounter (Signed)
Pt.notified

## 2019-07-02 ENCOUNTER — Other Ambulatory Visit: Payer: Self-pay

## 2019-07-02 MED ORDER — PANTOPRAZOLE SODIUM 40 MG PO TBEC: DELAYED_RELEASE_TABLET | ORAL | 1 refills | Status: DC

## 2019-08-01 ENCOUNTER — Other Ambulatory Visit (HOSPITAL_COMMUNITY): Payer: Self-pay | Admitting: Family Medicine

## 2019-08-01 DIAGNOSIS — Z1231 Encounter for screening mammogram for malignant neoplasm of breast: Secondary | ICD-10-CM

## 2019-08-03 ENCOUNTER — Telehealth (INDEPENDENT_AMBULATORY_CARE_PROVIDER_SITE_OTHER): Payer: Medicare Other | Admitting: Family Medicine

## 2019-08-03 ENCOUNTER — Telehealth: Payer: Self-pay | Admitting: *Deleted

## 2019-08-03 ENCOUNTER — Other Ambulatory Visit: Payer: Self-pay

## 2019-08-03 DIAGNOSIS — J309 Allergic rhinitis, unspecified: Secondary | ICD-10-CM

## 2019-08-03 MED ORDER — BENZONATATE 100 MG PO CAPS: 100.0000 mg | ORAL_CAPSULE | Freq: Two times a day (BID) | ORAL | 0 refills | Status: DC | PRN

## 2019-08-03 MED ORDER — PREDNISONE 10 MG PO TABS: ORAL_TABLET | ORAL | 0 refills | Status: DC

## 2019-08-03 NOTE — Progress Notes (Signed)
Patient ID: Jo Hale, female    DOB: 1960/09/19, 59 y.o.   MRN: [id number]   Chief Complaint  Patient presents with  . Cough  . Nasal Congestion    Virtual Visit via Phone Note  I connected with Jo Hale on 08/03/19 at 11:30 AM EDT by a phone enabled telemedicine application and verified that I am speaking with the correct person using two identifiers.  Location: Patient: home Provider: office   I discussed the limitations of evaluation and management by telemedicine and the availability of in person appointments. The patient expressed understanding and agreed to proceed.  Subjective:   HPI Having a dry cough for past 2 days.  Worse at night with laying down. Slight drainage in throat that is clear. Runny nose/congestion.  Has been using flonase and benadryl prn.  Benadryl is drying her up but feeling congested when laying down at night.   Has scratchy throat, but no pain, no fever, chills, n/v/d. Has been around sick contacts, with similar symptoms, they thought were allergies. No contact with positive covid.     Medical History Jo Hale has a past medical history of Abnormal pap, Fibroids, Fibroids, intramural, GERD (gastroesophageal reflux disease), Hyperlipidemia, Hypertension, Menopause (08/27/2014), Neoplasm of kidney, PONV (postoperative nausea and vomiting), Posterior pain of right hip (07/31/2013), Renal cancer, right ([place]) (05/2017), TIA (transient ischemic attack) (2017 or 2018 unsure), and Vaginal Pap smear, abnormal.   Outpatient Encounter Medications as of 08/03/2019  Medication Sig  . atorvastatin (LIPITOR) 20 MG tablet Take 1 tablet (20 mg total) by mouth daily.  . diphenhydrAMINE (BENADRYL) 25 mg capsule Take 25 mg by mouth daily as needed for allergies.   . fluticasone (FLONASE) 50 MCG/ACT nasal spray Place 2 sprays into both nostrils daily.  . hydrocortisone 2.5 % cream Apply a thin layer bid for 7 days to affected area  . Omega-3 Fatty  Acids (OMEGA-3 PO) Take 2 capsules by mouth daily.   . pantoprazole (PROTONIX) 40 MG tablet Take one tablet po daily as needed  . triamterene-hydrochlorothiazide (MAXZIDE-25) 37.5-25 MG tablet Take 1 tablet by mouth daily.  . benzonatate (TESSALON) 100 MG capsule Take 1 capsule (100 mg total) by mouth 2 (two) times daily as needed for cough.  . gabapentin (NEURONTIN) 300 MG capsule TAKE (1) CAPSULE BY MOUTH3AT BEDTIME.  Marland Kitchen HYDROcodone-acetaminophen (NORCO/VICODIN) 5-325 MG tablet Take 1 tablet by mouth 2 (two) times daily.  . predniSONE (DELTASONE) 10 MG tablet Take 3 tab p.o. x2 days, then 2 tab x 2 days, then 1 tab x2 days.  Take with food.   No facility-administered encounter medications on file as of 08/03/2019.     Vitals LMP 02/17/2011 Comment: partial   Objective:   Review of Systems  Constitutional: Negative for chills and fever.  HENT: Positive for congestion, postnasal drip and rhinorrhea. Negative for ear discharge, ear pain, sinus pressure, sinus pain, sneezing and sore throat.        +scratchy throat  Eyes: Negative for pain, discharge, redness and itching.  Respiratory: Positive for cough. Negative for shortness of breath and wheezing.   Cardiovascular: Negative for chest pain.  Gastrointestinal: Negative for diarrhea, nausea and vomiting.  Neurological: Negative for headaches.     PE- not completed due to phone visit.   Assessment and Plan   1. Allergic rhinitis, unspecified seasonality, unspecified trigger - predniSONE (DELTASONE) 10 MG tablet; Take 3 tab p.o. x2 days, then 2 tab x 2 days, then 1 tab x2 days.  Take with food.  Dispense: 12 tablet; Refill: 0 - benzonatate (TESSALON) 100 MG capsule; Take 1 capsule (100 mg total) by mouth 2 (two) times daily as needed for cough.  Dispense: 30 capsule; Refill: 0    Likely viral vs. Allergic rhinitis- Gave symptomatic treatment. Advising to use nasal rinse, Milta Deiters med otc or netti pot 1-2x per day.  Cont with flonase    Switch from benadryl to allegra or claritin. Gave prednisone taper and tessalon perles.  If worsening in next 5-7 days to call back. Gave precautions for worsening to call if having fever, pain in sinuses, worsening coughing or green/yellow nasal discharge.  Pt in agreement with plan.  f/u prn.   Follow Up Instructions:    I discussed the assessment and treatment plan with the patient. The patient was provided an opportunity to ask questions and all were answered. The patient agreed with the plan and demonstrated an understanding of the instructions.   The patient was advised to call back or seek an in-person evaluation if the symptoms worsen or if the condition fails to improve as anticipated.  I provided 15 minutes of non-face-to-face time during this encounter.

## 2019-08-03 NOTE — Telephone Encounter (Signed)
Ms. jolana, hasser are scheduled for a virtual visit with your provider today.    Just as we do with appointments in the office, we must obtain your consent to participate.  Your consent will be active for this visit and any virtual visit you may have with one of our providers in the next 365 days.    If you have a MyChart account, I can also send a copy of this consent to you electronically.  All virtual visits are billed to your insurance company just like a traditional visit in the office.  As this is a virtual visit, video technology does not allow for your provider to perform a traditional examination.  This may limit your provider's ability to fully assess your condition.  If your provider identifies any concerns that need to be evaluated in person or the need to arrange testing such as labs, EKG, etc, we will make arrangements to do so.    Although advances in technology are sophisticated, we cannot ensure that it will always work on either your end or our end.  If the connection with a video visit is poor, we may have to switch to a telephone visit.  With either a video or telephone visit, we are not always able to ensure that we have a secure connection.   I need to obtain your verbal consent now.   Are you willing to proceed with your visit today?   BRISTEN BATCHELLER has provided verbal consent on 08/03/2019 for a virtual visit (video or telephone).   Patsy Lager, LPN [id number]  [id number]

## 2019-08-15 DIAGNOSIS — Z85528 Personal history of other malignant neoplasm of kidney: Secondary | ICD-10-CM | POA: Diagnosis not present

## 2019-08-22 ENCOUNTER — Ambulatory Visit (HOSPITAL_COMMUNITY)
Admission: RE | Admit: 2019-08-22 | Discharge: 2019-08-22 | Disposition: A | Discharge status: Home / Self Care | Payer: Medicare Other | Source: Ambulatory Visit | Attending: Urology | Admitting: Urology

## 2019-08-22 ENCOUNTER — Other Ambulatory Visit (HOSPITAL_COMMUNITY): Payer: Self-pay | Admitting: Urology

## 2019-08-22 ENCOUNTER — Other Ambulatory Visit: Payer: Self-pay

## 2019-08-22 DIAGNOSIS — Z85528 Personal history of other malignant neoplasm of kidney: Secondary | ICD-10-CM

## 2019-08-22 DIAGNOSIS — R311 Benign essential microscopic hematuria: Secondary | ICD-10-CM | POA: Diagnosis not present

## 2019-09-17 ENCOUNTER — Ambulatory Visit (HOSPITAL_COMMUNITY)
Admission: RE | Admit: 2019-09-17 | Discharge: 2019-09-17 | Disposition: A | Discharge status: Home / Self Care | Payer: Medicare Other | Source: Ambulatory Visit | Attending: Family Medicine | Admitting: Family Medicine

## 2019-09-17 ENCOUNTER — Ambulatory Visit (HOSPITAL_COMMUNITY): Payer: 59 | Attending: Chiropractic Medicine | Admitting: Physical Therapy

## 2019-09-17 ENCOUNTER — Other Ambulatory Visit: Payer: Self-pay

## 2019-09-17 DIAGNOSIS — M25561 Pain in right knee: Secondary | ICD-10-CM | POA: Insufficient documentation

## 2019-09-17 DIAGNOSIS — Z1231 Encounter for screening mammogram for malignant neoplasm of breast: Secondary | ICD-10-CM | POA: Insufficient documentation

## 2019-09-17 DIAGNOSIS — M25562 Pain in left knee: Secondary | ICD-10-CM | POA: Insufficient documentation

## 2019-09-17 DIAGNOSIS — M5441 Lumbago with sciatica, right side: Secondary | ICD-10-CM | POA: Insufficient documentation

## 2019-09-17 DIAGNOSIS — M6281 Muscle weakness (generalized): Secondary | ICD-10-CM | POA: Diagnosis not present

## 2019-09-17 DIAGNOSIS — G8929 Other chronic pain: Secondary | ICD-10-CM | POA: Insufficient documentation

## 2019-09-17 DIAGNOSIS — M5442 Lumbago with sciatica, left side: Secondary | ICD-10-CM | POA: Diagnosis not present

## 2019-09-17 NOTE — Therapy (Signed)
[place], [name], [address] Phone: [phone]   Fax:  [phone]  Physical Therapy Evaluation  Patient Details  Name: Jo Hale MRN: [id number] Date of Birth: 11/08/1960 Referring Provider (PT): Levy Pupa PA   Encounter Date: 09/17/2019  PT End of Session - 09/17/19 1007    Visit Number  1    Number of Visits  12    Date for PT Re-Evaluation  10/29/19    Authorization Type  UHC primary, UHC medicare secondary, no auth, 60 VL combined PT/OT/ST 0 used    Authorization - Visit Number  1    Authorization - Number of Visits  60    Progress Note Due on Visit  10    PT Start Time  0917    PT Stop Time  1000    PT Time Calculation (min)  43 min    Activity Tolerance  Patient tolerated treatment well;Patient limited by pain    Behavior During Therapy  [facility] for tasks assessed/performed       Past Medical History:  Diagnosis Date  . Abnormal pap   . Fibroids   . Fibroids, intramural    450-600 gm uterus  . GERD (gastroesophageal reflux disease)   . Hyperlipidemia   . Hypertension   . Menopause 08/27/2014  . Neoplasm of kidney   . PONV (postoperative nausea and vomiting)   . Posterior pain of right hip 07/31/2013  . Renal cancer, right ([place]) 05/2017  . TIA (transient ischemic attack) 2017 or 2018 unsure  . Vaginal Pap smear, abnormal     Past Surgical History:  Procedure Laterality Date  . CESAREAN SECTION    . COLONOSCOPY  02/19/2011   Fields-incomplete exam, otherwise normal   . COLONOSCOPY N/A 03/20/2018   Procedure: COLONOSCOPY;  Surgeon: Danie Binder, MD;  [facility];  Service: Endoscopy;  Laterality: N/A;  9:30  . KNEE ARTHROSCOPY WITH MEDIAL MENISECTOMY Right 04/27/2018   Procedure: RIGHT KNEE ARTHROSCOPY WITH PARTIAL MEDIAL MENISECTOMY;  Surgeon: Carole Civil, MD;  Location: [facility];  Service: Orthopedics;  Laterality: Right;  . LAPAROSCOPIC SUPRACERVICAL HYSTERECTOMY   03/02/2011   Procedure: LAPAROSCOPIC SUPRACERVICAL HYSTERECTOMY;  Surgeon: Jonnie Kind, MD;  Location: [facility];  Service: Gynecology;  Laterality: N/A;  . PARTIAL HYSTERECTOMY    . ROBOT ASSISTED LAPAROSCOPIC NEPHRECTOMY Right 05/16/2017   Procedure: XI ROBOTIC ASSISTED LAPAROSCOPIC PARTIAL NEPHRECTOMY;  Surgeon: Raynelle Bring, MD;  Location: [facility];  Service: Urology;  Laterality: Right;  . ROTATOR CUFF REPAIR     left  . TUBAL LIGATION      There were no vitals filed for this visit.   Subjective Assessment - 09/17/19 0920    Subjective  Same pain that she was being treated here before. States the she left here with pain and that her pain is about the same. Pain is worse at night. States the pain wakes her up. States it feels like an aching and it radiates from the buttocks to the outside of the knee. Is on both sides with left worse then right. Repetitious activity bothers her. States she mostly sleeps on her left side or right sleep. She uses about 6 pillows on then med. Recently got new mattress about a month ago. No surgeries. Pain started in 2015 after a fall.    Pertinent History  chronic low back pain. R knee scope, TIA    Patient Stated Goals  to have less  pain    Currently in Pain?  Yes    Pain Score  8    at night   Pain Location  Back    Pain Orientation  Left    Pain Descriptors / Indicators  Sharp;Aching    Pain Type  Chronic pain    Pain Radiating Towards  towards knees         [facility] PT Assessment - 09/17/19 0001      Assessment   Medical Diagnosis  LBP with Radicular pain    Referring Provider (PT)  Levy Pupa PA    Prior Therapy  yes here for back and knees      Balance Screen   Has the patient fallen in the past 6 months  Yes    How many times?  1   about a month   Has the patient had a decrease in activity level because of a fear of falling?   Yes    Is the patient reluctant to leave their home because of a fear of falling?   No      Prior Function    Level of Independence  Independent      Observation/Other Assessments   Observations  hitches at L5/S1    Focus on Therapeutic Outcomes (FOTO)   56% limited      ROM / Strength   AROM / PROM / Strength  AROM      AROM   Overall AROM Comments  hip ROM WNL, pain across lumbar spine with hip flexion and IR/ER     AROM Assessment Site  Lumbar    Lumbar Flexion  75% limited   pain across low back, hitches at L5/S1   Lumbar Extension  75% limited   pain across low back, hitches at L5/S1   Lumbar - Right Side Bend  50% limited    pain across low back    Lumbar - Left Side Bend  50% limited   pain across low back, more pain with L SB   Lumbar - Right Rotation  75% limited   pain across low back   Lumbar - Left Rotation  75% limited   pain across low back     Palpation   Spinal mobility  will assess in future sessions      Special Tests   Other special tests  neg SLR test B                   Objective measurements completed on examination: See above findings.      [place] Adult PT Treatment/Exercise - 09/17/19 0001      Exercises   Exercises  Lumbar      Lumbar Exercises: Seated   Other Seated Lumbar Exercises  hip hinges seated- with dowel - x5     Other Seated Lumbar Exercises  lumbar roll across lumbar spine - felt good      Lumbar Exercises: Supine   Other Supine Lumbar Exercises  pelvic rocking - anterior tilts released pain 2x12     Other Supine Lumbar Exercises  lumbar roll across lumbar spine             PT Education - 09/17/19 0958    Education Details  educated patient on current condition, FOTO score, plan moving forward and decreased lumbopelvic rhythm.    Person(s) Educated  Patient    Methods  Explanation    Comprehension  Verbalized understanding       PT Short Term  Goals - 09/17/19 1004      PT SHORT TERM GOAL #1   Title  Patient will report at least 25% improvement in overall symptoms and/or functional ability.    Time  3     Period  Weeks    Status  New    Target Date  10/08/19      PT SHORT TERM GOAL #2   Title  Patient will be independent in self management strategies to improve quality of life and functional outcomes.    Time  3    Period  Weeks    Status  New    Target Date  10/08/19      PT SHORT TERM GOAL #3   Title  Patietn will be able to perform seated hip hinge with proper form 10x without dowel or verbal cues to demonstrate improved hip mobility in CKC    Time  3    Period  Weeks    Status  New    Target Date  10/08/19        PT Long Term Goals - 09/17/19 1005      PT LONG TERM GOAL #1   Title  Patient will report at least 50% improvement in overall symptoms and/or functional ability.    Time  6    Period  Weeks    Status  New    Target Date  10/29/19      PT LONG TERM GOAL #2   Title  Patient will be able to demonstrate pain free lumbar motion in in 4/6 of the motions/directions (flexion/extension/SB R/SB L/ ROT R/ ROT L)    Time  6    Period  Weeks    Status  New    Target Date  10/29/19      PT LONG TERM GOAL #3   Title  Patient will report improved sleeping through the night (secondary to pain) to demonstrate improved sleep quality.    Time  6    Period  Weeks    Status  New    Target Date  10/29/19             Plan - 09/17/19 1002    Clinical Impression Statement  Patient presents to therapy with chronic low back pain. Observed poor lumbopelvic rhythm with lumbar mobility screen and limited pelvic mobility at all with all movements. Educated patient in this and how improving hip mobility and glute activation with help decrease stress at lumbar spine. Patient verbalized understanding and would greatly benefit from skilled physical therapy to improve functional mobility and sleep quality.    Personal Factors and Comorbidities  Comorbidity 1;Comorbidity 2    Comorbidities  TIA, chronic low back pain    Examination-Activity Limitations  Squat;Sleep;Sit;Locomotion  Level;Transfers;Stand;Stairs    Examination-Participation Restrictions  Cleaning;Shop;Community Activity;Driving;Meal Prep;Church    Stability/Clinical Decision Making  Stable/Uncomplicated    Clinical Decision Making  Low    Rehab Potential  Good    PT Frequency  2x / week    PT Duration  6 weeks    PT Treatment/Interventions  ADLs/Self Care Home Management;Aquatic Therapy;Cryotherapy;Electrical Stimulation;Iontophoresis 4mg /ml Dexamethasone;Moist Heat;Traction;Balance training;Therapeutic exercise;Therapeutic activities;Functional mobility training;Stair training;Gait training;DME Instruction;Neuromuscular re-education;Patient/family education;Manual techniques;Dry needling;Passive range of motion;Joint Manipulations    PT Next Visit Plan  f/u with seated hip hinge (focus on this movement), lumbopelvic rhythm, hip motion in CKC, assess prone - quad length, hip extension (without compensatory lumabr movements)    PT Home Exercise Plan  6/7 seated hip  hinge with dowel    Consulted and Agree with Plan of Care  Patient       Patient will benefit from skilled therapeutic intervention in order to improve the following deficits and impairments:  Decreased activity tolerance, Pain, Decreased strength, Decreased mobility, Decreased range of motion  Visit Diagnosis: Chronic midline low back pain with bilateral sciatica  Muscle weakness (generalized)     Problem List Patient Active Problem List   Diagnosis Date Noted  . S/P right knee arthroscopy, medial menisectomy on 04/27/17 05/30/2018  . Derangement of posterior horn of medial meniscus of right knee   . Special screening for malignant neoplasms, colon   . Screening for colorectal cancer 09/01/2017  . Encounter for gynecological examination with Papanicolaou smear of cervix 09/01/2017  . Lumbar spondylosis 07/13/2017  . Neoplasm of right kidney 05/16/2017  . Superficial fungus infection of skin 08/31/2016  . Hyperlipidemia LDL goal <100  09/18/2015  . History of stroke 09/18/2015  . Cerebral infarction ([place]) 07/02/2015  . Menopause 08/27/2014  . Meralgia paresthetica of left side 04/05/2014  . Venous stasis of both lower extremities 04/05/2014  . Radicular low back pain 11/07/2013  . Leg weakness, bilateral 11/07/2013  . Lumbago 10/09/2013  . Posterior pain of right hip 07/31/2013  . Esophageal reflux 01/25/2013  . Essential hypertension, benign 01/25/2013  . Rectal bleed 01/28/2012  . Constipation 01/28/2012  . Pain in joint, shoulder region 08/02/2011  . Muscle weakness (generalized) 08/02/2011  . Status post rotator cuff repair 08/02/2011  . FOOT PAIN 12/22/2009  . 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 POPLITEAL CYST, LEFT 06/30/2009  . MEDIAL MENISCUS TEAR, LEFT 06/30/2009   10:19 AM, 09/17/19 Jerene Pitch, DPT Physical Therapy with [facility]  [phone] office  [place] [address] [place], [name], [address] Phone: [phone]   Fax:  [phone]  Name: Jo Hale MRN: [id number] Date of Birth: 1960-08-11

## 2019-09-20 ENCOUNTER — Other Ambulatory Visit: Payer: Self-pay

## 2019-09-20 ENCOUNTER — Ambulatory Visit (HOSPITAL_COMMUNITY): Payer: 59 | Admitting: Physical Therapy

## 2019-09-20 DIAGNOSIS — M6281 Muscle weakness (generalized): Secondary | ICD-10-CM | POA: Diagnosis not present

## 2019-09-20 DIAGNOSIS — M5441 Lumbago with sciatica, right side: Secondary | ICD-10-CM

## 2019-09-20 DIAGNOSIS — M5442 Lumbago with sciatica, left side: Secondary | ICD-10-CM | POA: Diagnosis not present

## 2019-09-20 DIAGNOSIS — M25562 Pain in left knee: Secondary | ICD-10-CM | POA: Diagnosis not present

## 2019-09-20 DIAGNOSIS — M25561 Pain in right knee: Secondary | ICD-10-CM | POA: Diagnosis not present

## 2019-09-20 DIAGNOSIS — G8929 Other chronic pain: Secondary | ICD-10-CM | POA: Diagnosis not present

## 2019-09-20 NOTE — Therapy (Signed)
[place], [name], [address] Phone: [phone]   Fax:  [phone]  Physical Therapy Treatment  Patient Details  Name: Jo Hale MRN: [id number] Date of Birth: Nov 13, 1960 Referring Provider (PT): Levy Pupa PA   Encounter Date: 09/20/2019   PT End of Session - 09/20/19 0911    Visit Number 2    Number of Visits 12    Date for PT Re-Evaluation 10/29/19    Authorization Type UHC primary, UHC medicare secondary, no auth, 60 VL combined PT/OT/ST 0 used    Authorization - Visit Number 2    Authorization - Number of Visits 60    Progress Note Due on Visit 10    PT Start Time [phone]    PT Stop Time 0910    PT Time Calculation (min) 32 min    Activity Tolerance Patient tolerated treatment well;Patient limited by pain    Behavior During Therapy [facility] for tasks assessed/performed           Past Medical History:  Diagnosis Date  . Abnormal pap   . Fibroids   . Fibroids, intramural    450-600 gm uterus  . GERD (gastroesophageal reflux disease)   . Hyperlipidemia   . Hypertension   . Menopause 08/27/2014  . Neoplasm of kidney   . PONV (postoperative nausea and vomiting)   . Posterior pain of right hip 07/31/2013  . Renal cancer, right ([place]) 05/2017  . TIA (transient ischemic attack) 2017 or 2018 unsure  . Vaginal Pap smear, abnormal     Past Surgical History:  Procedure Laterality Date  . CESAREAN SECTION    . COLONOSCOPY  02/19/2011   Fields-incomplete exam, otherwise normal   . COLONOSCOPY N/A 03/20/2018   Procedure: COLONOSCOPY;  Surgeon: Danie Binder, MD;  [facility];  Service: Endoscopy;  Laterality: N/A;  9:30  . KNEE ARTHROSCOPY WITH MEDIAL MENISECTOMY Right 04/27/2018   Procedure: RIGHT KNEE ARTHROSCOPY WITH PARTIAL MEDIAL MENISECTOMY;  Surgeon: Carole Civil, MD;  Location: [facility];  Service: Orthopedics;  Laterality: Right;  . LAPAROSCOPIC SUPRACERVICAL HYSTERECTOMY   03/02/2011   Procedure: LAPAROSCOPIC SUPRACERVICAL HYSTERECTOMY;  Surgeon: Jonnie Kind, MD;  Location: [facility];  Service: Gynecology;  Laterality: N/A;  . PARTIAL HYSTERECTOMY    . ROBOT ASSISTED LAPAROSCOPIC NEPHRECTOMY Right 05/16/2017   Procedure: XI ROBOTIC ASSISTED LAPAROSCOPIC PARTIAL NEPHRECTOMY;  Surgeon: Raynelle Bring, MD;  [facility];  Service: Urology;  Laterality: Right;  . ROTATOR CUFF REPAIR     left  . TUBAL LIGATION      There were no vitals filed for this visit.   Subjective Assessment - 09/20/19 0846    Subjective Pt states she is doing well today, having some radiculopathy into her Lt hip and pain at 6/10 today.  pt came to appt late and stated she would have to leave early to pick up someone from the hospital.    Currently in Pain? Yes    Pain Score 6     Pain Location Back    Pain Orientation Left                             [facility] Adult PT Treatment/Exercise - 09/20/19 0001      Lumbar Exercises: Stretches   Lower Trunk Rotation 5 reps;Limitations    Lower Trunk Rotation Limitations 10 reps each side    Prone on Elbows Stretch  60 seconds;2 reps      Lumbar Exercises: Standing   Other Standing Lumbar Exercises hip excursions 10 reps      Lumbar Exercises: Seated   Other Seated Lumbar Exercises hip hinges seated- with dowel - x5       Lumbar Exercises: Supine   Bridge 10 reps      Lumbar Exercises: Prone   Straight Leg Raise 10 reps    Other Prone Lumbar Exercises heelsqueeze 10X5"                  PT Education - 09/20/19 0946    Education Details Goal review, HEP and POC moving forward    Person(s) Educated Patient    Methods Explanation;Demonstration;Tactile cues;Verbal cues    Comprehension Returned demonstration;Verbalized understanding            PT Short Term Goals - 09/20/19 0907      PT SHORT TERM GOAL #1   Title Patient will report at least 25% improvement in overall symptoms and/or functional  ability.    Time 3    Period Weeks    Status On-going    Target Date 10/08/19      PT SHORT TERM GOAL #2   Title Patient will be independent in self management strategies to improve quality of life and functional outcomes.    Time 3    Period Weeks    Status On-going    Target Date 10/08/19      PT SHORT TERM GOAL #3   Title Patietn will be able to perform seated hip hinge with proper form 10x without dowel or verbal cues to demonstrate improved hip mobility in CKC    Time 3    Period Weeks    Status On-going    Target Date 10/08/19             PT Long Term Goals - 09/20/19 0908      PT LONG TERM GOAL #1   Title Patient will report at least 50% improvement in overall symptoms and/or functional ability.    Time 6    Period Weeks    Status On-going      PT LONG TERM GOAL #2   Title Patient will be able to demonstrate pain free lumbar motion in in 4/6 of the motions/directions (flexion/extension/SB R/SB L/ ROT R/ ROT L)    Time 6    Period Weeks    Status On-going      PT LONG TERM GOAL #3   Title Patient will report improved sleeping through the night (secondary to pain) to demonstrate improved sleep quality.    Time 6    Period Weeks    Status On-going                 Plan - 09/20/19 0944    Clinical Impression Statement Reveiwed goals, HEP and POC moving forward.  Began hip and lumbar mobiltiiy.  Prone quad length more affected by soft tissue block at 110 degrees bilaterally than by true mm tightness.  Prone on elbows added with good results as well as glute strenghtening in prone.  Pt may benefit from soft tissue work, however did not have time today as patient arrived late and had to leave early.    Personal Factors and Comorbidities Comorbidity 1;Comorbidity 2    Comorbidities TIA, chronic low back pain    Examination-Activity Limitations Squat;Sleep;Sit;Locomotion Level;Transfers;Stand;Stairs    Examination-Participation Restrictions  Cleaning;Shop;Community Activity;Driving;Meal Prep;Church    Stability/Clinical Decision  Making Stable/Uncomplicated    Rehab Potential Good    PT Frequency 2x / week    PT Duration 6 weeks    PT Treatment/Interventions ADLs/Self Care Home Management;Aquatic Therapy;Cryotherapy;Electrical Stimulation;Iontophoresis 4mg /ml Dexamethasone;Moist Heat;Traction;Balance training;Therapeutic exercise;Therapeutic activities;Functional mobility training;Stair training;Gait training;DME Instruction;Neuromuscular re-education;Patient/family education;Manual techniques;Dry needling;Passive range of motion;Joint Manipulations    PT Next Visit Plan f/u with seated hip hinge (focus on this movement), lumbopelvic rhythm, hip motion in closed chain.    PT Home Exercise Plan 6/7 seated hip hinge with dowel    Consulted and Agree with Plan of Care Patient           Patient will benefit from skilled therapeutic intervention in order to improve the following deficits and impairments:  Decreased activity tolerance, Pain, Decreased strength, Decreased mobility, Decreased range of motion  Visit Diagnosis: Chronic midline low back pain with bilateral sciatica  Muscle weakness (generalized)     Problem List Patient Active Problem List   Diagnosis Date Noted  . S/P right knee arthroscopy, medial menisectomy on 04/27/17 05/30/2018  . Derangement of posterior horn of medial meniscus of right knee   . Special screening for malignant neoplasms, colon   . Screening for colorectal cancer 09/01/2017  . Encounter for gynecological examination with Papanicolaou smear of cervix 09/01/2017  . Lumbar spondylosis 07/13/2017  . Neoplasm of right kidney 05/16/2017  . Superficial fungus infection of skin 08/31/2016  . Hyperlipidemia LDL goal <100 09/18/2015  . History of stroke 09/18/2015  . Cerebral infarction ([place]) 07/02/2015  . Menopause 08/27/2014  . Meralgia paresthetica of left side 04/05/2014  . Venous stasis of  both lower extremities 04/05/2014  . Radicular low back pain 11/07/2013  . Leg weakness, bilateral 11/07/2013  . Lumbago 10/09/2013  . Posterior pain of right hip 07/31/2013  . Esophageal reflux 01/25/2013  . Essential hypertension, benign 01/25/2013  . Rectal bleed 01/28/2012  . Constipation 01/28/2012  . Pain in joint, shoulder region 08/02/2011  . Muscle weakness (generalized) 08/02/2011  . Status post rotator cuff repair 08/02/2011  . FOOT PAIN 12/22/2009  . 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 POPLITEAL CYST, LEFT 06/30/2009  . MEDIAL MENISCUS TEAR, LEFT 06/30/2009   Teena Irani, PTA/CLT [phone]  Teena Irani 09/20/2019, 9:50 AM  [place] [address] [place], [name], [address] Phone: [phone]   Fax:  [phone]  Name: RHYA SHAN MRN: [id number] Date of Birth: [birth year]

## 2019-09-24 ENCOUNTER — Encounter (HOSPITAL_COMMUNITY): Payer: Self-pay | Admitting: Physical Therapy

## 2019-09-24 ENCOUNTER — Other Ambulatory Visit: Payer: Self-pay

## 2019-09-24 ENCOUNTER — Ambulatory Visit (HOSPITAL_COMMUNITY): Payer: 59 | Admitting: Physical Therapy

## 2019-09-24 DIAGNOSIS — M6281 Muscle weakness (generalized): Secondary | ICD-10-CM | POA: Diagnosis not present

## 2019-09-24 DIAGNOSIS — M25561 Pain in right knee: Secondary | ICD-10-CM | POA: Diagnosis not present

## 2019-09-24 DIAGNOSIS — M5442 Lumbago with sciatica, left side: Secondary | ICD-10-CM

## 2019-09-24 DIAGNOSIS — M5441 Lumbago with sciatica, right side: Secondary | ICD-10-CM | POA: Diagnosis not present

## 2019-09-24 DIAGNOSIS — G8929 Other chronic pain: Secondary | ICD-10-CM

## 2019-09-24 DIAGNOSIS — M25562 Pain in left knee: Secondary | ICD-10-CM | POA: Diagnosis not present

## 2019-09-24 NOTE — Therapy (Signed)
[place], [name], [address] Phone: [phone]   Fax:  [phone]  Physical Therapy Treatment  Patient Details  Name: Jo Hale MRN: [id number] Date of Birth: [birth year] Referring Provider (PT): Levy Pupa PA   Encounter Date: 09/24/2019   PT End of Session - 09/24/19 0839    Visit Number 3    Number of Visits 12    Date for PT Re-Evaluation 10/29/19    Authorization Type UHC primary, UHC medicare secondary, no auth, 60 VL combined PT/OT/ST 0 used    Authorization - Visit Number 3    Authorization - Number of Visits 60    Progress Note Due on Visit 10    PT Start Time [phone]   pt late to session   PT Stop Time 0910    PT Time Calculation (min) 31 min    Activity Tolerance Patient tolerated treatment well;Patient limited by pain    Behavior During Therapy [facility] for tasks assessed/performed           Past Medical History:  Diagnosis Date  . Abnormal pap   . Fibroids   . Fibroids, intramural    450-600 gm uterus  . GERD (gastroesophageal reflux disease)   . Hyperlipidemia   . Hypertension   . Menopause 08/27/2014  . Neoplasm of kidney   . PONV (postoperative nausea and vomiting)   . Posterior pain of right hip 07/31/2013  . Renal cancer, right ([place]) 05/2017  . TIA (transient ischemic attack) 2017 or 2018 unsure  . Vaginal Pap smear, abnormal     Past Surgical History:  Procedure Laterality Date  . CESAREAN SECTION    . COLONOSCOPY  02/19/2011   Fields-incomplete exam, otherwise normal   . COLONOSCOPY N/A 03/20/2018   Procedure: COLONOSCOPY;  Surgeon: Danie Binder, MD;  [facility];  Service: Endoscopy;  Laterality: N/A;  9:30  . KNEE ARTHROSCOPY WITH MEDIAL MENISECTOMY Right 04/27/2018   Procedure: RIGHT KNEE ARTHROSCOPY WITH PARTIAL MEDIAL MENISECTOMY;  Surgeon: Carole Civil, MD;  Location: [facility];  Service: Orthopedics;  Laterality: Right;  . LAPAROSCOPIC  SUPRACERVICAL HYSTERECTOMY  03/02/2011   Procedure: LAPAROSCOPIC SUPRACERVICAL HYSTERECTOMY;  Surgeon: Jonnie Kind, MD;  Location: [facility];  Service: Gynecology;  Laterality: N/A;  . PARTIAL HYSTERECTOMY    . ROBOT ASSISTED LAPAROSCOPIC NEPHRECTOMY Right 05/16/2017   Procedure: XI ROBOTIC ASSISTED LAPAROSCOPIC PARTIAL NEPHRECTOMY;  Surgeon: Raynelle Bring, MD;  Location: [facility];  Service: Urology;  Laterality: Right;  . ROTATOR CUFF REPAIR     left  . TUBAL LIGATION      There were no vitals filed for this visit.   Subjective Assessment - 09/24/19 0852    Subjective States that she is waking up at night due to pain. States that she wakes up about 3-4x/night with pain still right across her low back and right lower buttocks area. Trying to get comfortable and very difficult. States she does not sleep on her back.    Currently in Pain? Yes    Pain Location Back    Pain Orientation Left              [facility] PT Assessment - 09/24/19 0001      Assessment   Medical Diagnosis LBP with Radicular pain    Referring Provider (PT) Levy Pupa PA  [facility] PT Treatment/Exercise - 09/24/19 0001      Lumbar Exercises: Seated   Other Seated Lumbar Exercises seated hip hinge with dowel - verbala nd tactile cues - still continues to focus on lumbar flexion- improved with PT tactile cues - 6 minutes; anterior/posterior pelvic tilt with 1/2 foam roller seated - x25 5" holds in anterior pelvic tilt - verbal and tactile cues for position    Other Seated Lumbar Exercises hip add - isometric x20 B 5" holds; seated goddess pose - hip abd x10 5" holds       Manual Therapy   Manual Therapy Soft tissue mobilization;Joint mobilization    Manual therapy comments completed separate from all other skilled interventions.    Joint Mobilization apex mobilization to sacrum - not tolerated secondary to tenderness    Soft tissue mobilization tapotement and STM to right  glute med                    PT Short Term Goals - 09/20/19 0907      PT SHORT TERM GOAL #1   Title Patient will report at least 25% improvement in overall symptoms and/or functional ability.    Time 3    Period Weeks    Status On-going    Target Date 10/08/19      PT SHORT TERM GOAL #2   Title Patient will be independent in self management strategies to improve quality of life and functional outcomes.    Time 3    Period Weeks    Status On-going    Target Date 10/08/19      PT SHORT TERM GOAL #3   Title Patietn will be able to perform seated hip hinge with proper form 10x without dowel or verbal cues to demonstrate improved hip mobility in CKC    Time 3    Period Weeks    Status On-going    Target Date 10/08/19             PT Long Term Goals - 09/20/19 0908      PT LONG TERM GOAL #1   Title Patient will report at least 50% improvement in overall symptoms and/or functional ability.    Time 6    Period Weeks    Status On-going      PT LONG TERM GOAL #2   Title Patient will be able to demonstrate pain free lumbar motion in in 4/6 of the motions/directions (flexion/extension/SB R/SB L/ ROT R/ ROT L)    Time 6    Period Weeks    Status On-going      PT LONG TERM GOAL #3   Title Patient will report improved sleeping through the night (secondary to pain) to demonstrate improved sleep quality.    Time 6    Period Weeks    Status On-going                 Plan - 09/24/19 0914    Clinical Impression Statement Focused on lumbopelvic mobility secondary to excessive lumbar mobility with all movements. Educated patient on current plan. Attempted sacral mobilizations at apex but soft tissue musculature was too tender to touch. gentle STM to desensitize area. Decreased pain noted end of session, 3/10. Continued difficulties with anterior pelvic tilt noted. Will add in breath work to perform prior to going to bed to promote muscle relaxation.    Personal  Factors and Comorbidities Comorbidity 1;Comorbidity 2    Comorbidities TIA, chronic low back  pain    Examination-Activity Limitations Squat;Sleep;Sit;Locomotion Level;Transfers;Stand;Stairs    Examination-Participation Restrictions Cleaning;Shop;Community Activity;Driving;Meal Prep;Church    Stability/Clinical Decision Making Stable/Uncomplicated    Rehab Potential Good    PT Frequency 2x / week    PT Duration 6 weeks    PT Treatment/Interventions ADLs/Self Care Home Management;Aquatic Therapy;Cryotherapy;Electrical Stimulation;Iontophoresis 4mg /ml Dexamethasone;Moist Heat;Traction;Balance training;Therapeutic exercise;Therapeutic activities;Functional mobility training;Stair training;Gait training;DME Instruction;Neuromuscular re-education;Patient/family education;Manual techniques;Dry needling;Passive range of motion;Joint Manipulations    PT Next Visit Plan continue with hip hinge (focus on this movement), lumbopelvic rhythm, hip motion in closed chain.    PT Home Exercise Plan 6/7 seated hip hinge with dowel    Consulted and Agree with Plan of Care Patient           Patient will benefit from skilled therapeutic intervention in order to improve the following deficits and impairments:  Decreased activity tolerance, Pain, Decreased strength, Decreased mobility, Decreased range of motion  Visit Diagnosis: Chronic midline low back pain with bilateral sciatica  Muscle weakness (generalized)     Problem List Patient Active Problem List   Diagnosis Date Noted  . S/P right knee arthroscopy, medial menisectomy on 04/27/17 05/30/2018  . Derangement of posterior horn of medial meniscus of right knee   . Special screening for malignant neoplasms, colon   . Screening for colorectal cancer 09/01/2017  . Encounter for gynecological examination with Papanicolaou smear of cervix 09/01/2017  . Lumbar spondylosis 07/13/2017  . Neoplasm of right kidney 05/16/2017  . Superficial fungus infection of  skin 08/31/2016  . Hyperlipidemia LDL goal <100 09/18/2015  . History of stroke 09/18/2015  . Cerebral infarction ([place]) 07/02/2015  . Menopause 08/27/2014  . Meralgia paresthetica of left side 04/05/2014  . Venous stasis of both lower extremities 04/05/2014  . Radicular low back pain 11/07/2013  . Leg weakness, bilateral 11/07/2013  . Lumbago 10/09/2013  . Posterior pain of right hip 07/31/2013  . Esophageal reflux 01/25/2013  . Essential hypertension, benign 01/25/2013  . Rectal bleed 01/28/2012  . Constipation 01/28/2012  . Pain in joint, shoulder region 08/02/2011  . Muscle weakness (generalized) 08/02/2011  . Status post rotator cuff repair 08/02/2011  . FOOT PAIN 12/22/2009  . 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 POPLITEAL CYST, LEFT 06/30/2009  . MEDIAL MENISCUS TEAR, LEFT 06/30/2009   9:15 AM, 09/24/19 Jerene Pitch, DPT Physical Therapy with [facility]  [phone] office   [place] [address] [place], [name], [address] Phone: [phone]   Fax:  [phone]  Name: Jo Hale MRN: [id number] Date of Birth: 07-May-1960

## 2019-09-27 ENCOUNTER — Ambulatory Visit (HOSPITAL_COMMUNITY): Payer: 59 | Admitting: Physical Therapy

## 2019-09-27 ENCOUNTER — Telehealth (HOSPITAL_COMMUNITY): Payer: Self-pay | Admitting: Physical Therapy

## 2019-09-27 NOTE — Telephone Encounter (Signed)
Pt has apptment to have her car repaired in [facility] - she will not be here

## 2019-10-01 ENCOUNTER — Ambulatory Visit (HOSPITAL_COMMUNITY): Payer: 59 | Admitting: Physical Therapy

## 2019-10-01 ENCOUNTER — Encounter (HOSPITAL_COMMUNITY): Payer: Self-pay | Admitting: Physical Therapy

## 2019-10-01 ENCOUNTER — Other Ambulatory Visit: Payer: Self-pay

## 2019-10-01 DIAGNOSIS — M25562 Pain in left knee: Secondary | ICD-10-CM | POA: Diagnosis not present

## 2019-10-01 DIAGNOSIS — M6281 Muscle weakness (generalized): Secondary | ICD-10-CM | POA: Diagnosis not present

## 2019-10-01 DIAGNOSIS — M25561 Pain in right knee: Secondary | ICD-10-CM | POA: Diagnosis not present

## 2019-10-01 DIAGNOSIS — M5441 Lumbago with sciatica, right side: Secondary | ICD-10-CM | POA: Diagnosis not present

## 2019-10-01 DIAGNOSIS — M5442 Lumbago with sciatica, left side: Secondary | ICD-10-CM | POA: Diagnosis not present

## 2019-10-01 DIAGNOSIS — G8929 Other chronic pain: Secondary | ICD-10-CM | POA: Diagnosis not present

## 2019-10-01 NOTE — Therapy (Signed)
[place], [name], [address] Phone: [phone]   Fax:  [phone]  Physical Therapy Treatment  Patient Details  Name: Jo Hale MRN: [id number] Date of Birth: 02/05/1961 Referring Provider (PT): Levy Pupa PA   Encounter Date: 10/01/2019   PT End of Session - 10/01/19 0833    Visit Number 4    Number of Visits 12    Date for PT Re-Evaluation 10/29/19    Authorization Type UHC primary, UHC medicare secondary, no auth, 60 VL combined PT/OT/ST 0 used    Authorization - Visit Number 4    Authorization - Number of Visits 60    Progress Note Due on Visit 10    PT Start Time [phone]    PT Stop Time 0911    PT Time Calculation (min) 38 min    Activity Tolerance Patient tolerated treatment well;Patient limited by pain    Behavior During Therapy [facility] for tasks assessed/performed           Past Medical History:  Diagnosis Date  . Abnormal pap   . Fibroids   . Fibroids, intramural    450-600 gm uterus  . GERD (gastroesophageal reflux disease)   . Hyperlipidemia   . Hypertension   . Menopause 08/27/2014  . Neoplasm of kidney   . PONV (postoperative nausea and vomiting)   . Posterior pain of right hip 07/31/2013  . Renal cancer, right ([place]) 05/2017  . TIA (transient ischemic attack) 2017 or 2018 unsure  . Vaginal Pap smear, abnormal     Past Surgical History:  Procedure Laterality Date  . CESAREAN SECTION    . COLONOSCOPY  02/19/2011   Fields-incomplete exam, otherwise normal   . COLONOSCOPY N/A 03/20/2018   Procedure: COLONOSCOPY;  Surgeon: Danie Binder, MD;  [facility];  Service: Endoscopy;  Laterality: N/A;  9:30  . KNEE ARTHROSCOPY WITH MEDIAL MENISECTOMY Right 04/27/2018   Procedure: RIGHT KNEE ARTHROSCOPY WITH PARTIAL MEDIAL MENISECTOMY;  Surgeon: Carole Civil, MD;  Location: [facility];  Service: Orthopedics;  Laterality: Right;  . LAPAROSCOPIC SUPRACERVICAL HYSTERECTOMY   03/02/2011   Procedure: LAPAROSCOPIC SUPRACERVICAL HYSTERECTOMY;  Surgeon: Jonnie Kind, MD;  Location: [facility];  Service: Gynecology;  Laterality: N/A;  . PARTIAL HYSTERECTOMY    . ROBOT ASSISTED LAPAROSCOPIC NEPHRECTOMY Right 05/16/2017   Procedure: XI ROBOTIC ASSISTED LAPAROSCOPIC PARTIAL NEPHRECTOMY;  Surgeon: Raynelle Bring, MD;  [facility];  Service: Urology;  Laterality: Right;  . ROTATOR CUFF REPAIR     left  . TUBAL LIGATION      There were no vitals filed for this visit.   Subjective Assessment - 10/01/19 0837    Subjective States she has bene really busy and hasn't been able to do her exercises. Current pain is about 4/10. At night it continues to act up. Can't stand for long periods of time.    Currently in Pain? Yes    Pain Score 4     Pain Location Back    Pain Orientation Left    Pain Descriptors / Indicators Aching;Hervey Ard              [facility] PT Assessment - 10/01/19 0001      Assessment   Medical Diagnosis LBP with Radicular pain    Referring Provider (PT) Levy Pupa PA                         [facility]  Adult PT Treatment/Exercise - 10/01/19 0001      Lumbar Exercises: Seated   Other Seated Lumbar Exercises seated self traction x5, 10" holds, supin eover table traction 1x 60" holds - pain in chest.     Other Seated Lumbar Exercises seated on exercise ball - prior demonstration and verbal/tactile cues- pelvic tilts anterior/lateral  1 minute  - 3 sets      Lumbar Exercises: Supine   Other Supine Lumbar Exercises diaphragmatic breathing - practiced for 1 minute intervals -prior demonstration - practiced 5 sets.                   PT Education - 10/01/19 [phone]    Education Details educated patient on active vs passive posture and how she stands (excessive lumbar lordosis at L5/S1) and how this is contributing to her pain. educated patient in intra-abdominal pressure and how diaphragmatic breathing can help with that.    Person(s)  Educated Patient    Methods Explanation    Comprehension Verbalized understanding            PT Short Term Goals - 09/20/19 0907      PT SHORT TERM GOAL #1   Title Patient will report at least 25% improvement in overall symptoms and/or functional ability.    Time 3    Period Weeks    Status On-going    Target Date 10/08/19      PT SHORT TERM GOAL #2   Title Patient will be independent in self management strategies to improve quality of life and functional outcomes.    Time 3    Period Weeks    Status On-going    Target Date 10/08/19      PT SHORT TERM GOAL #3   Title Patietn will be able to perform seated hip hinge with proper form 10x without dowel or verbal cues to demonstrate improved hip mobility in CKC    Time 3    Period Weeks    Status On-going    Target Date 10/08/19             PT Long Term Goals - 09/20/19 0908      PT LONG TERM GOAL #1   Title Patient will report at least 50% improvement in overall symptoms and/or functional ability.    Time 6    Period Weeks    Status On-going      PT LONG TERM GOAL #2   Title Patient will be able to demonstrate pain free lumbar motion in in 4/6 of the motions/directions (flexion/extension/SB R/SB L/ ROT R/ ROT L)    Time 6    Period Weeks    Status On-going      PT LONG TERM GOAL #3   Title Patient will report improved sleeping through the night (secondary to pain) to demonstrate improved sleep quality.    Time 6    Period Weeks    Status On-going                 Plan - 10/01/19 0915    Clinical Impression Statement Focused initially on traction and this helped with low back but also increased pressure/discomfort elsewhere (arms and chest). Educated patient on pelvic mobility/tilts and more active vs passive posture. Practiced and educated patient in diaphragmatic breathing with exhale being most challenging for patient. Decreased pain noted end of session, 1/10. Instructed patient to perform breathing  exercises at least once a day with focus right before bed.  Personal Factors and Comorbidities Comorbidity 1;Comorbidity 2    Comorbidities TIA, chronic low back pain    Examination-Activity Limitations Squat;Sleep;Sit;Locomotion Level;Transfers;Stand;Stairs    Examination-Participation Restrictions Cleaning;Shop;Community Activity;Driving;Meal Prep;Church    Stability/Clinical Decision Making Stable/Uncomplicated    Rehab Potential Good    PT Frequency 2x / week    PT Duration 6 weeks    PT Treatment/Interventions ADLs/Self Care Home Management;Aquatic Therapy;Cryotherapy;Electrical Stimulation;Iontophoresis 4mg /ml Dexamethasone;Moist Heat;Traction;Balance training;Therapeutic exercise;Therapeutic activities;Functional mobility training;Stair training;Gait training;DME Instruction;Neuromuscular re-education;Patient/family education;Manual techniques;Dry needling;Passive range of motion;Joint Manipulations    PT Next Visit Plan anterior/pelvic tilts, TRA activation in standing/supine, more active posture. diaphragmatic breathing - progress, continue with hip hinge (focus on this movement), lumbopelvic rhythm, hip motion in closed chain.    PT Home Exercise Plan 6/7 seated hip hinge with dowel; 6/21 diaphragmatic breathing    Consulted and Agree with Plan of Care Patient           Patient will benefit from skilled therapeutic intervention in order to improve the following deficits and impairments:  Decreased activity tolerance, Pain, Decreased strength, Decreased mobility, Decreased range of motion  Visit Diagnosis: Chronic midline low back pain with bilateral sciatica  Muscle weakness (generalized)  Chronic pain of left knee  Chronic pain of right knee     Problem List Patient Active Problem List   Diagnosis Date Noted  . S/P right knee arthroscopy, medial menisectomy on 04/27/17 05/30/2018  . Derangement of posterior horn of medial meniscus of right knee   . Special screening  for malignant neoplasms, colon   . Screening for colorectal cancer 09/01/2017  . Encounter for gynecological examination with Papanicolaou smear of cervix 09/01/2017  . Lumbar spondylosis 07/13/2017  . Neoplasm of right kidney 05/16/2017  . Superficial fungus infection of skin 08/31/2016  . Hyperlipidemia LDL goal <100 09/18/2015  . History of stroke 09/18/2015  . Cerebral infarction ([place]) 07/02/2015  . Menopause 08/27/2014  . Meralgia paresthetica of left side 04/05/2014  . Venous stasis of both lower extremities 04/05/2014  . Radicular low back pain 11/07/2013  . Leg weakness, bilateral 11/07/2013  . Lumbago 10/09/2013  . Posterior pain of right hip 07/31/2013  . Esophageal reflux 01/25/2013  . Essential hypertension, benign 01/25/2013  . Rectal bleed 01/28/2012  . Constipation 01/28/2012  . Pain in joint, shoulder region 08/02/2011  . Muscle weakness (generalized) 08/02/2011  . Status post rotator cuff repair 08/02/2011  . FOOT PAIN 12/22/2009  . 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 POPLITEAL CYST, LEFT 06/30/2009  . MEDIAL MENISCUS TEAR, LEFT 06/30/2009   9:16 AM, 10/01/19 Jerene Pitch, DPT Physical Therapy with [facility]  [phone] office  [place] [address] [place], [name], [address] Phone: [phone]   Fax:  [phone]  Name: Jo Hale MRN: [id number] Date of Birth: [birth year]

## 2019-10-02 ENCOUNTER — Ambulatory Visit (HOSPITAL_COMMUNITY): Payer: 59 | Admitting: Physical Therapy

## 2019-10-02 ENCOUNTER — Encounter (HOSPITAL_COMMUNITY): Payer: Self-pay | Admitting: Physical Therapy

## 2019-10-02 DIAGNOSIS — M25562 Pain in left knee: Secondary | ICD-10-CM

## 2019-10-02 DIAGNOSIS — M5442 Lumbago with sciatica, left side: Secondary | ICD-10-CM | POA: Diagnosis not present

## 2019-10-02 DIAGNOSIS — M5441 Lumbago with sciatica, right side: Secondary | ICD-10-CM | POA: Diagnosis not present

## 2019-10-02 DIAGNOSIS — M6281 Muscle weakness (generalized): Secondary | ICD-10-CM

## 2019-10-02 DIAGNOSIS — M25561 Pain in right knee: Secondary | ICD-10-CM

## 2019-10-02 DIAGNOSIS — G8929 Other chronic pain: Secondary | ICD-10-CM

## 2019-10-02 NOTE — Therapy (Signed)
[place], [name], [address] Phone: [phone]   Fax:  [phone]  Physical Therapy Treatment  Patient Details  Name: Jo Hale MRN: [id number] Date of Birth: December 26, 1960 Referring Provider (PT): Levy Pupa PA   Encounter Date: 10/02/2019   PT End of Session - 10/02/19 0827    Visit Number 5    Number of Visits 12    Date for PT Re-Evaluation 10/29/19    Authorization Type UHC primary, UHC medicare secondary, no auth, 60 VL combined PT/OT/ST 0 used    Authorization - Visit Number 5    Authorization - Number of Visits 60    Progress Note Due on Visit 10    PT Start Time 0830    PT Stop Time 0908    PT Time Calculation (min) 38 min    Activity Tolerance Patient tolerated treatment well;Patient limited by pain    Behavior During Therapy [facility] for tasks assessed/performed           Past Medical History:  Diagnosis Date  . Abnormal pap   . Fibroids   . Fibroids, intramural    450-600 gm uterus  . GERD (gastroesophageal reflux disease)   . Hyperlipidemia   . Hypertension   . Menopause 08/27/2014  . Neoplasm of kidney   . PONV (postoperative nausea and vomiting)   . Posterior pain of right hip 07/31/2013  . Renal cancer, right ([place]) 05/2017  . TIA (transient ischemic attack) 2017 or 2018 unsure  . Vaginal Pap smear, abnormal     Past Surgical History:  Procedure Laterality Date  . CESAREAN SECTION    . COLONOSCOPY  02/19/2011   Fields-incomplete exam, otherwise normal   . COLONOSCOPY N/A 03/20/2018   Procedure: COLONOSCOPY;  Surgeon: Danie Binder, MD;  [facility];  Service: Endoscopy;  Laterality: N/A;  9:30  . KNEE ARTHROSCOPY WITH MEDIAL MENISECTOMY Right 04/27/2018   Procedure: RIGHT KNEE ARTHROSCOPY WITH PARTIAL MEDIAL MENISECTOMY;  Surgeon: Carole Civil, MD;  Location: [facility];  Service: Orthopedics;  Laterality: Right;  . LAPAROSCOPIC SUPRACERVICAL HYSTERECTOMY   03/02/2011   Procedure: LAPAROSCOPIC SUPRACERVICAL HYSTERECTOMY;  Surgeon: Jonnie Kind, MD;  Location: [facility];  Service: Gynecology;  Laterality: N/A;  . PARTIAL HYSTERECTOMY    . ROBOT ASSISTED LAPAROSCOPIC NEPHRECTOMY Right 05/16/2017   Procedure: XI ROBOTIC ASSISTED LAPAROSCOPIC PARTIAL NEPHRECTOMY;  Surgeon: Raynelle Bring, MD;  Location: [facility];  Service: Urology;  Laterality: Right;  . ROTATOR CUFF REPAIR     left  . TUBAL LIGATION      There were no vitals filed for this visit.   Subjective Assessment - 10/02/19 0840    Subjective States that she continues to have pain that wakes her up at night. States that just laying in one place really bothers her. States she woke up about three times. States she is breathing better and she tried breathing prior to going to bed and that felt good but she still woke up with pain.              [facility] PT Assessment - 10/02/19 0001      Assessment   Medical Diagnosis LBP with Radicular pain    Referring Provider (PT) Levy Pupa PA                         [facility] Adult PT Treatment/Exercise - 10/02/19 0001      Lumbar  Exercises: Seated   Other Seated Lumbar Exercises pelvic tilts posterior -- 5 minutes      Lumbar Exercises: Supine   Ab Set 5 seconds   4 sets - 60" bouts, 5" holds - tactile and verbal cues   Pelvic Tilt 10 reps;10 seconds   posterior - verbal and tactile cues- x3 sets     Lumbar Exercises: Sidelying   Other Sidelying Lumbar Exercises pelvic tilts PT assist - 3 minutes                  PT Education - 10/02/19 0840    Education Details educated apteitn on use of bolster at night, TRA activation. educated patient in pelvic and core mobility and performing this throughout the day to decrease compounded stress on nerve and lumbar spine.    Person(s) Educated Patient    Methods Explanation    Comprehension Verbalized understanding            PT Short Term Goals - 09/20/19 0907      PT  SHORT TERM GOAL #1   Title Patient will report at least 25% improvement in overall symptoms and/or functional ability.    Time 3    Period Weeks    Status On-going    Target Date 10/08/19      PT SHORT TERM GOAL #2   Title Patient will be independent in self management strategies to improve quality of life and functional outcomes.    Time 3    Period Weeks    Status On-going    Target Date 10/08/19      PT SHORT TERM GOAL #3   Title Patietn will be able to perform seated hip hinge with proper form 10x without dowel or verbal cues to demonstrate improved hip mobility in CKC    Time 3    Period Weeks    Status On-going    Target Date 10/08/19             PT Long Term Goals - 09/20/19 0908      PT LONG TERM GOAL #1   Title Patient will report at least 50% improvement in overall symptoms and/or functional ability.    Time 6    Period Weeks    Status On-going      PT LONG TERM GOAL #2   Title Patient will be able to demonstrate pain free lumbar motion in in 4/6 of the motions/directions (flexion/extension/SB R/SB L/ ROT R/ ROT L)    Time 6    Period Weeks    Status On-going      PT LONG TERM GOAL #3   Title Patient will report improved sleeping through the night (secondary to pain) to demonstrate improved sleep quality.    Time 6    Period Weeks    Status On-going                 Plan - 10/02/19 0827    Clinical Impression Statement Patient continues to have discomfort across low back. Symptoms reduce with TRA activation and posterior pelvic tilt. Reduced symptoms end of session. Focused on educating patient on performing this motion throughout the day and prior to bed to help decrease stress on lumbar region and to help improve sleep quality. Will follow up with pain symptoms and TRA activation next session.    Personal Factors and Comorbidities Comorbidity 1;Comorbidity 2    Comorbidities TIA, chronic low back pain    Examination-Activity Limitations  Squat;Sleep;Sit;Locomotion Level;Transfers;Stand;Stairs  Examination-Participation Restrictions Cleaning;Shop;Community Activity;Driving;Meal Prep;Church    Stability/Clinical Decision Making Stable/Uncomplicated    Rehab Potential Good    PT Frequency 2x / week    PT Duration 6 weeks    PT Treatment/Interventions ADLs/Self Care Home Management;Aquatic Therapy;Cryotherapy;Electrical Stimulation;Iontophoresis 4mg /ml Dexamethasone;Moist Heat;Traction;Balance training;Therapeutic exercise;Therapeutic activities;Functional mobility training;Stair training;Gait training;DME Instruction;Neuromuscular re-education;Patient/family education;Manual techniques;Dry needling;Passive range of motion;Joint Manipulations    PT Next Visit Plan posterior pelvic tilts, TRA activation in standing/supine, more active posture. diaphragmatic breathing - progress, continue with hip hinge (focus on this movement), lumbopelvic rhythm, hip motion in closed chain.    PT Home Exercise Plan 6/7 seated hip hinge with dowel; 6/21 diaphragmatic breathing    Consulted and Agree with Plan of Care Patient           Patient will benefit from skilled therapeutic intervention in order to improve the following deficits and impairments:  Decreased activity tolerance, Pain, Decreased strength, Decreased mobility, Decreased range of motion  Visit Diagnosis: Chronic midline low back pain with bilateral sciatica  Muscle weakness (generalized)  Chronic pain of left knee  Chronic pain of right knee     Problem List Patient Active Problem List   Diagnosis Date Noted  . S/P right knee arthroscopy, medial menisectomy on 04/27/17 05/30/2018  . Derangement of posterior horn of medial meniscus of right knee   . Special screening for malignant neoplasms, colon   . Screening for colorectal cancer 09/01/2017  . Encounter for gynecological examination with Papanicolaou smear of cervix 09/01/2017  . Lumbar spondylosis 07/13/2017  .  Neoplasm of right kidney 05/16/2017  . Superficial fungus infection of skin 08/31/2016  . Hyperlipidemia LDL goal <100 09/18/2015  . History of stroke 09/18/2015  . Cerebral infarction ([place]) 07/02/2015  . Menopause 08/27/2014  . Meralgia paresthetica of left side 04/05/2014  . Venous stasis of both lower extremities 04/05/2014  . Radicular low back pain 11/07/2013  . Leg weakness, bilateral 11/07/2013  . Lumbago 10/09/2013  . Posterior pain of right hip 07/31/2013  . Esophageal reflux 01/25/2013  . Essential hypertension, benign 01/25/2013  . Rectal bleed 01/28/2012  . Constipation 01/28/2012  . Pain in joint, shoulder region 08/02/2011  . Muscle weakness (generalized) 08/02/2011  . Status post rotator cuff repair 08/02/2011  . FOOT PAIN 12/22/2009  . 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 POPLITEAL CYST, LEFT 06/30/2009  . MEDIAL MENISCUS TEAR, LEFT 06/30/2009   9:15 AM, 10/02/19 Jerene Pitch, DPT Physical Therapy with [facility]  [phone] office  [place] [address] [place], [name], [address] Phone: [phone]   Fax:  [phone]  Name: Jo Hale MRN: [id number] Date of Birth: 19-Jul-1960

## 2019-10-08 ENCOUNTER — Ambulatory Visit (HOSPITAL_COMMUNITY): Payer: 59 | Admitting: Physical Therapy

## 2019-10-10 ENCOUNTER — Ambulatory Visit (HOSPITAL_COMMUNITY): Payer: 59 | Admitting: Physical Therapy

## 2019-10-10 ENCOUNTER — Other Ambulatory Visit: Payer: Self-pay

## 2019-10-10 DIAGNOSIS — M6281 Muscle weakness (generalized): Secondary | ICD-10-CM

## 2019-10-10 DIAGNOSIS — M25561 Pain in right knee: Secondary | ICD-10-CM | POA: Diagnosis not present

## 2019-10-10 DIAGNOSIS — G8929 Other chronic pain: Secondary | ICD-10-CM | POA: Diagnosis not present

## 2019-10-10 DIAGNOSIS — M25562 Pain in left knee: Secondary | ICD-10-CM | POA: Diagnosis not present

## 2019-10-10 DIAGNOSIS — M5441 Lumbago with sciatica, right side: Secondary | ICD-10-CM | POA: Diagnosis not present

## 2019-10-10 DIAGNOSIS — M5442 Lumbago with sciatica, left side: Secondary | ICD-10-CM | POA: Diagnosis not present

## 2019-10-10 NOTE — Therapy (Signed)
[place], [name], [address] Phone: [phone]   Fax:  [phone]  Physical Therapy Treatment  Patient Details  Name: Jo Hale MRN: [id number] Date of Birth: [birth year] Referring Provider (PT): Levy Pupa PA   Encounter Date: 10/10/2019   PT End of Session - 10/10/19 1607    Visit Number 6    Number of Visits 12    Date for PT Re-Evaluation 10/29/19    Authorization Type UHC primary, UHC medicare secondary, no auth, 60 VL combined PT/OT/ST 0 used    Authorization - Visit Number 6    Authorization - Number of Visits 60    Progress Note Due on Visit 10    PT Start Time 1450    PT Stop Time 1530    PT Time Calculation (min) 40 min    Activity Tolerance Patient tolerated treatment well;Patient limited by pain    Behavior During Therapy [facility] for tasks assessed/performed           Past Medical History:  Diagnosis Date  . Abnormal pap   . Fibroids   . Fibroids, intramural    450-600 gm uterus  . GERD (gastroesophageal reflux disease)   . Hyperlipidemia   . Hypertension   . Menopause 08/27/2014  . Neoplasm of kidney   . PONV (postoperative nausea and vomiting)   . Posterior pain of right hip 07/31/2013  . Renal cancer, right ([place]) 05/2017  . TIA (transient ischemic attack) 2017 or 2018 unsure  . Vaginal Pap smear, abnormal     Past Surgical History:  Procedure Laterality Date  . CESAREAN SECTION    . COLONOSCOPY  02/19/2011   Fields-incomplete exam, otherwise normal   . COLONOSCOPY N/A 03/20/2018   Procedure: COLONOSCOPY;  Surgeon: Danie Binder, MD;  [facility];  Service: Endoscopy;  Laterality: N/A;  9:30  . KNEE ARTHROSCOPY WITH MEDIAL MENISECTOMY Right 04/27/2018   Procedure: RIGHT KNEE ARTHROSCOPY WITH PARTIAL MEDIAL MENISECTOMY;  Surgeon: Carole Civil, MD;  Location: [facility];  Service: Orthopedics;  Laterality: Right;  . LAPAROSCOPIC SUPRACERVICAL HYSTERECTOMY   03/02/2011   Procedure: LAPAROSCOPIC SUPRACERVICAL HYSTERECTOMY;  Surgeon: Jonnie Kind, MD;  Location: [facility];  Service: Gynecology;  Laterality: N/A;  . PARTIAL HYSTERECTOMY    . ROBOT ASSISTED LAPAROSCOPIC NEPHRECTOMY Right 05/16/2017   Procedure: XI ROBOTIC ASSISTED LAPAROSCOPIC PARTIAL NEPHRECTOMY;  Surgeon: Raynelle Bring, MD;  Location: [facility];  Service: Urology;  Laterality: Right;  . ROTATOR CUFF REPAIR     left  . TUBAL LIGATION      There were no vitals filed for this visit.   Subjective Assessment - 10/10/19 1609    Subjective pt reports no significant change.  STates she's been compliant with her exercises.  Currenlty 4-5/10    Currently in Pain? Yes    Pain Location Back    Pain Orientation Left    Pain Descriptors / Indicators Aching;Sharp    Pain Type Chronic pain                             [facility] Adult PT Treatment/Exercise - 10/10/19 0001      Lumbar Exercises: Seated   Other Seated Lumbar Exercises pelvic tilts posterior -- 5 minutes      Lumbar Exercises: Supine   Ab Set 10 reps    Pelvic Tilt 10 reps;10 seconds    Bent Knee Raise 10  reps    Bent Knee Raise Limitations with abdominal iso    Bridge 10 reps    Straight Leg Raise 10 reps    Straight Leg Raises Limitations with ab set      Lumbar Exercises: Sidelying   Clam Both;10 reps;3 seconds    Hip Abduction Both;10 reps                    PT Short Term Goals - 09/20/19 1308      PT SHORT TERM GOAL #1   Title Patient will report at least 25% improvement in overall symptoms and/or functional ability.    Time 3    Period Weeks    Status On-going    Target Date 10/08/19      PT SHORT TERM GOAL #2   Title Patient will be independent in self management strategies to improve quality of life and functional outcomes.    Time 3    Period Weeks    Status On-going    Target Date 10/08/19      PT SHORT TERM GOAL #3   Title Patietn will be able to perform seated hip hinge  with proper form 10x without dowel or verbal cues to demonstrate improved hip mobility in CKC    Time 3    Period Weeks    Status On-going    Target Date 10/08/19             PT Long Term Goals - 09/20/19 0908      PT LONG TERM GOAL #1   Title Patient will report at least 50% improvement in overall symptoms and/or functional ability.    Time 6    Period Weeks    Status On-going      PT LONG TERM GOAL #2   Title Patient will be able to demonstrate pain free lumbar motion in in 4/6 of the motions/directions (flexion/extension/SB R/SB L/ ROT R/ ROT L)    Time 6    Period Weeks    Status On-going      PT LONG TERM GOAL #3   Title Patient will report improved sleeping through the night (secondary to pain) to demonstrate improved sleep quality.    Time 6    Period Weeks    Status On-going                 Plan - 10/10/19 1607    Clinical Impression Statement Continued with focus on improving core stability and pelvic mobility.   Pt requires cues to stabilize abdominal musculature with all therex with noted weakness/early fatigue of mm.    Personal Factors and Comorbidities Comorbidity 1;Comorbidity 2    Comorbidities TIA, chronic low back pain    Examination-Activity Limitations Squat;Sleep;Sit;Locomotion Level;Transfers;Stand;Stairs    Examination-Participation Restrictions Cleaning;Shop;Community Activity;Driving;Meal Prep;Church    Stability/Clinical Decision Making Stable/Uncomplicated    Rehab Potential Good    PT Frequency 2x / week    PT Duration 6 weeks    PT Treatment/Interventions ADLs/Self Care Home Management;Aquatic Therapy;Cryotherapy;Electrical Stimulation;Iontophoresis 4mg /ml Dexamethasone;Moist Heat;Traction;Balance training;Therapeutic exercise;Therapeutic activities;Functional mobility training;Stair training;Gait training;DME Instruction;Neuromuscular re-education;Patient/family education;Manual techniques;Dry needling;Passive range of motion;Joint  Manipulations    PT Next Visit Plan posterior pelvic tilts, TRA activation in standing/supine, more active posture. diaphragmatic breathing - progress, continue with hip hinge (focus on this movement), lumbopelvic rhythm, hip motion in closed chain.    PT Home Exercise Plan 6/7 seated hip hinge with dowel; 6/21 diaphragmatic breathing    Consulted and Agree with  Plan of Care Patient           Patient will benefit from skilled therapeutic intervention in order to improve the following deficits and impairments:  Decreased activity tolerance, Pain, Decreased strength, Decreased mobility, Decreased range of motion  Visit Diagnosis: Chronic midline low back pain with bilateral sciatica  Muscle weakness (generalized)  Chronic pain of left knee     Problem List Patient Active Problem List   Diagnosis Date Noted  . S/P right knee arthroscopy, medial menisectomy on 04/27/17 05/30/2018  . Derangement of posterior horn of medial meniscus of right knee   . Special screening for malignant neoplasms, colon   . Screening for colorectal cancer 09/01/2017  . Encounter for gynecological examination with Papanicolaou smear of cervix 09/01/2017  . Lumbar spondylosis 07/13/2017  . Neoplasm of right kidney 05/16/2017  . Superficial fungus infection of skin 08/31/2016  . Hyperlipidemia LDL goal <100 09/18/2015  . History of stroke 09/18/2015  . Cerebral infarction ([place]) 07/02/2015  . Menopause 08/27/2014  . Meralgia paresthetica of left side 04/05/2014  . Venous stasis of both lower extremities 04/05/2014  . Radicular low back pain 11/07/2013  . Leg weakness, bilateral 11/07/2013  . Lumbago 10/09/2013  . Posterior pain of right hip 07/31/2013  . Esophageal reflux 01/25/2013  . Essential hypertension, benign 01/25/2013  . Rectal bleed 01/28/2012  . Constipation 01/28/2012  . Pain in joint, shoulder region 08/02/2011  . Muscle weakness (generalized) 08/02/2011  . Status post rotator cuff repair  08/02/2011  . FOOT PAIN 12/22/2009  . 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 POPLITEAL CYST, LEFT 06/30/2009  . MEDIAL MENISCUS TEAR, LEFT 06/30/2009   Teena Irani, PTA/CLT [phone]  Teena Irani 10/10/2019, 4:12 PM  [place] [address] [place], [name], [address] Phone: [phone]   Fax:  [phone]  Name: Jo Hale MRN: [id number] Date of Birth: [birth year]

## 2019-10-11 ENCOUNTER — Ambulatory Visit (HOSPITAL_COMMUNITY): Payer: 59 | Attending: Chiropractic Medicine | Admitting: Physical Therapy

## 2019-10-11 DIAGNOSIS — M5442 Lumbago with sciatica, left side: Secondary | ICD-10-CM | POA: Diagnosis not present

## 2019-10-11 DIAGNOSIS — M25562 Pain in left knee: Secondary | ICD-10-CM | POA: Diagnosis not present

## 2019-10-11 DIAGNOSIS — M5441 Lumbago with sciatica, right side: Secondary | ICD-10-CM | POA: Diagnosis not present

## 2019-10-11 DIAGNOSIS — M25561 Pain in right knee: Secondary | ICD-10-CM | POA: Insufficient documentation

## 2019-10-11 DIAGNOSIS — M6281 Muscle weakness (generalized): Secondary | ICD-10-CM | POA: Insufficient documentation

## 2019-10-11 DIAGNOSIS — G8929 Other chronic pain: Secondary | ICD-10-CM | POA: Diagnosis not present

## 2019-10-11 NOTE — Therapy (Signed)
[facility] [place], [name], [address] Phone: [phone]   Fax:  [phone]  Physical Therapy Treatment  Patient Details  Name: Jo Hale MRN: [id number] Date of Birth: 1960-08-01 Referring Provider (PT): Levy Pupa PA   Encounter Date: 10/11/2019   PT End of Session - 10/11/19 0918    Visit Number 7    Number of Visits 12    Date for PT Re-Evaluation 10/29/19    Authorization Type UHC primary, UHC medicare secondary, no auth, 60 VL combined PT/OT/ST 0 used    Authorization - Visit Number 7    Authorization - Number of Visits 60    Progress Note Due on Visit 10    PT Start Time [phone]    PT Stop Time 0915    PT Time Calculation (min) 38 min    Activity Tolerance Patient tolerated treatment well;Patient limited by pain    Behavior During Therapy [facility] for tasks assessed/performed           Past Medical History:  Diagnosis Date  . Abnormal pap   . Fibroids   . Fibroids, intramural    450-600 gm uterus  . GERD (gastroesophageal reflux disease)   . Hyperlipidemia   . Hypertension   . Menopause 08/27/2014  . Neoplasm of kidney   . PONV (postoperative nausea and vomiting)   . Posterior pain of right hip 07/31/2013  . Renal cancer, right ([place]) 05/2017  . TIA (transient ischemic attack) 2017 or 2018 unsure  . Vaginal Pap smear, abnormal     Past Surgical History:  Procedure Laterality Date  . CESAREAN SECTION    . COLONOSCOPY  02/19/2011   Fields-incomplete exam, otherwise normal   . COLONOSCOPY N/A 03/20/2018   Procedure: COLONOSCOPY;  Surgeon: Danie Binder, MD;  [facility];  Service: Endoscopy;  Laterality: N/A;  9:30  . KNEE ARTHROSCOPY WITH MEDIAL MENISECTOMY Right 04/27/2018   Procedure: RIGHT KNEE ARTHROSCOPY WITH PARTIAL MEDIAL MENISECTOMY;  Surgeon: Carole Civil, MD;  Location: [facility];  Service: Orthopedics;  Laterality: Right;  . LAPAROSCOPIC SUPRACERVICAL HYSTERECTOMY   03/02/2011   Procedure: LAPAROSCOPIC SUPRACERVICAL HYSTERECTOMY;  Surgeon: Jonnie Kind, MD;  Location: [facility];  Service: Gynecology;  Laterality: N/A;  . PARTIAL HYSTERECTOMY    . ROBOT ASSISTED LAPAROSCOPIC NEPHRECTOMY Right 05/16/2017   Procedure: XI ROBOTIC ASSISTED LAPAROSCOPIC PARTIAL NEPHRECTOMY;  Surgeon: Raynelle Bring, MD;  Location: [facility];  Service: Urology;  Laterality: Right;  . ROTATOR CUFF REPAIR     left  . TUBAL LIGATION      There were no vitals filed for this visit.   Subjective Assessment - 10/11/19 0840    Subjective Pt states she was sore last night in her lower back following her PT session.  Currently 4/10 in lumbar region.    Currently in Pain? Yes    Pain Score 4     Pain Location Back    Pain Orientation Mid;Lower    Pain Descriptors / Indicators Aching;Sore                             [facility] Adult PT Treatment/Exercise - 10/11/19 0001      Lumbar Exercises: Seated   Sit to Stand 10 reps    Sit to Stand Limitations glute activation, eccentric return    Other Seated Lumbar Exercises scap retraction 2X10    Other Seated Lumbar Exercises pelvic  tilts posterior -- 5 minutes      Lumbar Exercises: Supine   Ab Set 10 reps    Pelvic Tilt 10 reps;10 seconds    Bent Knee Raise 10 reps    Bent Knee Raise Limitations with abdominal iso    Bridge 10 reps    Straight Leg Raise 10 reps    Straight Leg Raises Limitations with ab set      Lumbar Exercises: Sidelying   Clam Both;10 reps;3 seconds    Hip Abduction Both;10 reps      Manual Therapy   Manual Therapy Soft tissue mobilization    Manual therapy comments completed seperately from all other skilled interventions    Soft tissue mobilization in prone to bil lumbar paraspinals                    PT Short Term Goals - 09/20/19 0907      PT SHORT TERM GOAL #1   Title Patient will report at least 25% improvement in overall symptoms and/or functional ability.    Time 3     Period Weeks    Status On-going    Target Date 10/08/19      PT SHORT TERM GOAL #2   Title Patient will be independent in self management strategies to improve quality of life and functional outcomes.    Time 3    Period Weeks    Status On-going    Target Date 10/08/19      PT SHORT TERM GOAL #3   Title Patietn will be able to perform seated hip hinge with proper form 10x without dowel or verbal cues to demonstrate improved hip mobility in CKC    Time 3    Period Weeks    Status On-going    Target Date 10/08/19             PT Long Term Goals - 09/20/19 0908      PT LONG TERM GOAL #1   Title Patient will report at least 50% improvement in overall symptoms and/or functional ability.    Time 6    Period Weeks    Status On-going      PT LONG TERM GOAL #2   Title Patient will be able to demonstrate pain free lumbar motion in in 4/6 of the motions/directions (flexion/extension/SB R/SB L/ ROT R/ ROT L)    Time 6    Period Weeks    Status On-going      PT LONG TERM GOAL #3   Title Patient will report improved sleeping through the night (secondary to pain) to demonstrate improved sleep quality.    Time 6    Period Weeks    Status On-going                 Plan - 10/11/19 6962    Clinical Impression Statement continued focus on improving breathing, core stability, posture and pelvic mobiltiy.  Added scap retraction in seated with cues for keeping head in neutral.   Better abdominal contractions acheived this session with increased lumbar stability with therex.  manual completed at EOS in prone to help reduce spasm and tightness.  lt tighter than Rt today.  Pt reported overall improvement with pain reduction at end of session.    Personal Factors and Comorbidities Comorbidity 1;Comorbidity 2    Comorbidities TIA, chronic low back pain    Examination-Activity Limitations Squat;Sleep;Sit;Locomotion Level;Transfers;Stand;Stairs    Examination-Participation Restrictions  Cleaning;Shop;Community Activity;Driving;Meal Prep;Church  Stability/Clinical Decision Making Stable/Uncomplicated    Rehab Potential Good    PT Frequency 2x / week    PT Duration 6 weeks    PT Treatment/Interventions ADLs/Self Care Home Management;Aquatic Therapy;Cryotherapy;Electrical Stimulation;Iontophoresis 4mg /ml Dexamethasone;Moist Heat;Traction;Balance training;Therapeutic exercise;Therapeutic activities;Functional mobility training;Stair training;Gait training;DME Instruction;Neuromuscular re-education;Patient/family education;Manual techniques;Dry needling;Passive range of motion;Joint Manipulations    PT Next Visit Plan continue to progress hip mobility and lumbar stability.  Continue with manual PRN to reduce pain and tightness.    PT Home Exercise Plan 6/7 seated hip hinge with dowel; 6/21 diaphragmatic breathing    Consulted and Agree with Plan of Care Patient           Patient will benefit from skilled therapeutic intervention in order to improve the following deficits and impairments:  Decreased activity tolerance, Pain, Decreased strength, Decreased mobility, Decreased range of motion  Visit Diagnosis: Chronic midline low back pain with bilateral sciatica  Muscle weakness (generalized)     Problem List Patient Active Problem List   Diagnosis Date Noted  . S/P right knee arthroscopy, medial menisectomy on 04/27/17 05/30/2018  . Derangement of posterior horn of medial meniscus of right knee   . Special screening for malignant neoplasms, colon   . Screening for colorectal cancer 09/01/2017  . Encounter for gynecological examination with Papanicolaou smear of cervix 09/01/2017  . Lumbar spondylosis 07/13/2017  . Neoplasm of right kidney 05/16/2017  . Superficial fungus infection of skin 08/31/2016  . Hyperlipidemia LDL goal <100 09/18/2015  . History of stroke 09/18/2015  . Cerebral infarction ([place]) 07/02/2015  . Menopause 08/27/2014  . Meralgia paresthetica of  left side 04/05/2014  . Venous stasis of both lower extremities 04/05/2014  . Radicular low back pain 11/07/2013  . Leg weakness, bilateral 11/07/2013  . Lumbago 10/09/2013  . Posterior pain of right hip 07/31/2013  . Esophageal reflux 01/25/2013  . Essential hypertension, benign 01/25/2013  . Rectal bleed 01/28/2012  . Constipation 01/28/2012  . Pain in joint, shoulder region 08/02/2011  . Muscle weakness (generalized) 08/02/2011  . Status post rotator cuff repair 08/02/2011  . FOOT PAIN 12/22/2009  . 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 POPLITEAL CYST, LEFT 06/30/2009  . MEDIAL MENISCUS TEAR, LEFT 06/30/2009   Teena Irani, PTA/CLT [phone]  Teena Irani 10/11/2019, 9:32 AM  [facility] [address] [place], [name], [address] Phone: [phone]   Fax:  [phone]  Name: MYRNA VONSEGGERN MRN: [id number] Date of Birth: 1961-01-07

## 2019-10-17 ENCOUNTER — Ambulatory Visit (HOSPITAL_COMMUNITY): Payer: 59 | Admitting: Physical Therapy

## 2019-10-17 ENCOUNTER — Other Ambulatory Visit: Payer: Self-pay

## 2019-10-17 DIAGNOSIS — G8929 Other chronic pain: Secondary | ICD-10-CM | POA: Diagnosis not present

## 2019-10-17 DIAGNOSIS — M5441 Lumbago with sciatica, right side: Secondary | ICD-10-CM | POA: Diagnosis not present

## 2019-10-17 DIAGNOSIS — M6281 Muscle weakness (generalized): Secondary | ICD-10-CM | POA: Diagnosis not present

## 2019-10-17 DIAGNOSIS — M5442 Lumbago with sciatica, left side: Secondary | ICD-10-CM | POA: Diagnosis not present

## 2019-10-17 DIAGNOSIS — M25562 Pain in left knee: Secondary | ICD-10-CM | POA: Diagnosis not present

## 2019-10-17 DIAGNOSIS — M25561 Pain in right knee: Secondary | ICD-10-CM | POA: Diagnosis not present

## 2019-10-17 NOTE — Therapy (Signed)
[facility] [place], [name], [address] Phone: [phone]   Fax:  [phone]  Physical Therapy Treatment  Patient Details  Name: Jo Hale MRN: [id number] Date of Birth: [birth year] Referring Provider (PT): Levy Pupa PA   Encounter Date: 10/17/2019   PT End of Session - 10/17/19 0917    Visit Number 8    Number of Visits 12    Date for PT Re-Evaluation 10/29/19    Authorization Type UHC primary, UHC medicare secondary, no auth, 60 VL combined PT/OT/ST 0 used    Authorization - Visit Number 8    Authorization - Number of Visits 60    Progress Note Due on Visit 10    PT Start Time [phone]    PT Stop Time 0915    PT Time Calculation (min) 40 min    Activity Tolerance Patient tolerated treatment well;Patient limited by pain    Behavior During Therapy [facility] for tasks assessed/performed           Past Medical History:  Diagnosis Date  . Abnormal pap   . Fibroids   . Fibroids, intramural    450-600 gm uterus  . GERD (gastroesophageal reflux disease)   . Hyperlipidemia   . Hypertension   . Menopause 08/27/2014  . Neoplasm of kidney   . PONV (postoperative nausea and vomiting)   . Posterior pain of right hip 07/31/2013  . Renal cancer, right ([place]) 05/2017  . TIA (transient ischemic attack) 2017 or 2018 unsure  . Vaginal Pap smear, abnormal     Past Surgical History:  Procedure Laterality Date  . CESAREAN SECTION    . COLONOSCOPY  02/19/2011   Fields-incomplete exam, otherwise normal   . COLONOSCOPY N/A 03/20/2018   Procedure: COLONOSCOPY;  Surgeon: Danie Binder, MD;  [facility];  Service: Endoscopy;  Laterality: N/A;  9:30  . KNEE ARTHROSCOPY WITH MEDIAL MENISECTOMY Right 04/27/2018   Procedure: RIGHT KNEE ARTHROSCOPY WITH PARTIAL MEDIAL MENISECTOMY;  Surgeon: Carole Civil, MD;  Location: [facility];  Service: Orthopedics;  Laterality: Right;  . LAPAROSCOPIC SUPRACERVICAL HYSTERECTOMY   03/02/2011   Procedure: LAPAROSCOPIC SUPRACERVICAL HYSTERECTOMY;  Surgeon: Jonnie Kind, MD;  Location: [facility];  Service: Gynecology;  Laterality: N/A;  . PARTIAL HYSTERECTOMY    . ROBOT ASSISTED LAPAROSCOPIC NEPHRECTOMY Right 05/16/2017   Procedure: XI ROBOTIC ASSISTED LAPAROSCOPIC PARTIAL NEPHRECTOMY;  Surgeon: Raynelle Bring, MD;  Location[facility];  Service: Urology;  Laterality: Right;  . ROTATOR CUFF REPAIR     left  . TUBAL LIGATION      There were no vitals filed for this visit.   Subjective Assessment - 10/17/19 0842    Subjective pt states she's really not huring today, more pain when she's trying to go to bed at night.  hard to get comfortable when she lays down.    Currently in Pain? No/denies                             [facility] Adult PT Treatment/Exercise - 10/17/19 0001      Lumbar Exercises: Seated   Sit to Stand 10 reps    Sit to Stand Limitations glute activation, eccentric return    Other Seated Lumbar Exercises scap retraction 2X10    Other Seated Lumbar Exercises pelvic tilts posterior 10 reps      Lumbar Exercises: Supine   Ab Set 15 reps  Pelvic Tilt 10 reps;10 seconds    Bent Knee Raise 10 reps    Bridge 10 reps;Limitations    Bridge Limitations 2 sets    Straight Leg Raise 10 reps;Limitations    Straight Leg Raises Limitations 2 sets with ab set      Lumbar Exercises: Sidelying   Clam Both;10 reps;3 seconds    Hip Abduction Both;10 reps      Lumbar Exercises: Prone   Straight Leg Raise 10 reps      Manual Therapy   Manual Therapy Soft tissue mobilization    Manual therapy comments completed seperately from all other skilled interventions    Soft tissue mobilization 3 minutes in prone to bil lumbar paraspinals                    PT Short Term Goals - 09/20/19 0907      PT SHORT TERM GOAL #1   Title Patient will report at least 25% improvement in overall symptoms and/or functional ability.    Time 3    Period  Weeks    Status On-going    Target Date 10/08/19      PT SHORT TERM GOAL #2   Title Patient will be independent in self management strategies to improve quality of life and functional outcomes.    Time 3    Period Weeks    Status On-going    Target Date 10/08/19      PT SHORT TERM GOAL #3   Title Patietn will be able to perform seated hip hinge with proper form 10x without dowel or verbal cues to demonstrate improved hip mobility in CKC    Time 3    Period Weeks    Status On-going    Target Date 10/08/19             PT Long Term Goals - 09/20/19 0908      PT LONG TERM GOAL #1   Title Patient will report at least 50% improvement in overall symptoms and/or functional ability.    Time 6    Period Weeks    Status On-going      PT LONG TERM GOAL #2   Title Patient will be able to demonstrate pain free lumbar motion in in 4/6 of the motions/directions (flexion/extension/SB R/SB L/ ROT R/ ROT L)    Time 6    Period Weeks    Status On-going      PT LONG TERM GOAL #3   Title Patient will report improved sleeping through the night (secondary to pain) to demonstrate improved sleep quality.    Time 6    Period Weeks    Status On-going                 Plan - 10/17/19 3557    Clinical Impression Statement continued with established therex with overall improving form and difficulty.  Less cues with core stability. Manual completed at EOS, however no tightness or restrictions noted (only 2-3 minutes completed).    Personal Factors and Comorbidities Comorbidity 1;Comorbidity 2    Comorbidities TIA, chronic low back pain    Examination-Activity Limitations Squat;Sleep;Sit;Locomotion Level;Transfers;Stand;Stairs    Examination-Participation Restrictions Cleaning;Shop;Community Activity;Driving;Meal Prep;Church    Stability/Clinical Decision Making Stable/Uncomplicated    Rehab Potential Good    PT Frequency 2x / week    PT Duration 6 weeks    PT Treatment/Interventions  ADLs/Self Care Home Management;Aquatic Therapy;Cryotherapy;Electrical Stimulation;Iontophoresis 4mg /ml Dexamethasone;Moist Heat;Traction;Balance training;Therapeutic exercise;Therapeutic activities;Functional mobility training;Stair training;Gait  training;DME Instruction;Neuromuscular re-education;Patient/family education;Manual techniques;Dry needling;Passive range of motion;Joint Manipulations    PT Next Visit Plan continue to progress hip mobility and lumbar stability.  Progress to standing stability therex.  Add postural 3 with theraband.    PT Home Exercise Plan 6/7 seated hip hinge with dowel; 6/21 diaphragmatic breathing    Consulted and Agree with Plan of Care Patient           Patient will benefit from skilled therapeutic intervention in order to improve the following deficits and impairments:  Decreased activity tolerance, Pain, Decreased strength, Decreased mobility, Decreased range of motion  Visit Diagnosis: Chronic pain of left knee  Chronic pain of right knee  Muscle weakness (generalized)  Chronic midline low back pain with bilateral sciatica     Problem List Patient Active Problem List   Diagnosis Date Noted  . S/P right knee arthroscopy, medial menisectomy on 04/27/17 05/30/2018  . Derangement of posterior horn of medial meniscus of right knee   . Special screening for malignant neoplasms, colon   . Screening for colorectal cancer 09/01/2017  . Encounter for gynecological examination with Papanicolaou smear of cervix 09/01/2017  . Lumbar spondylosis 07/13/2017  . Neoplasm of right kidney 05/16/2017  . Superficial fungus infection of skin 08/31/2016  . Hyperlipidemia LDL goal <100 09/18/2015  . History of stroke 09/18/2015  . Cerebral infarction ([place]) 07/02/2015  . Menopause 08/27/2014  . Meralgia paresthetica of left side 04/05/2014  . Venous stasis of both lower extremities 04/05/2014  . Radicular low back pain 11/07/2013  . Leg weakness, bilateral  11/07/2013  . Lumbago 10/09/2013  . Posterior pain of right hip 07/31/2013  . Esophageal reflux 01/25/2013  . Essential hypertension, benign 01/25/2013  . Rectal bleed 01/28/2012  . Constipation 01/28/2012  . Pain in joint, shoulder region 08/02/2011  . Muscle weakness (generalized) 08/02/2011  . Status post rotator cuff repair 08/02/2011  . FOOT PAIN 12/22/2009  . 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 POPLITEAL CYST, LEFT 06/30/2009  . MEDIAL MENISCUS TEAR, LEFT 06/30/2009   Teena Irani, PTA/CLT [phone]  Teena Irani 10/17/2019, 9:21 AM  [facility] [address] [place], [name], [address] Phone: [phone]   Fax:  [phone]  Name: Jo Hale MRN: [id number] Date of Birth: Aug 01, 1960

## 2019-10-19 ENCOUNTER — Ambulatory Visit (HOSPITAL_COMMUNITY): Payer: 59

## 2019-10-19 ENCOUNTER — Other Ambulatory Visit: Payer: Self-pay

## 2019-10-19 ENCOUNTER — Encounter (HOSPITAL_COMMUNITY): Payer: Self-pay

## 2019-10-19 DIAGNOSIS — M6281 Muscle weakness (generalized): Secondary | ICD-10-CM | POA: Diagnosis not present

## 2019-10-19 DIAGNOSIS — M25562 Pain in left knee: Secondary | ICD-10-CM | POA: Diagnosis not present

## 2019-10-19 DIAGNOSIS — G8929 Other chronic pain: Secondary | ICD-10-CM | POA: Diagnosis not present

## 2019-10-19 DIAGNOSIS — M25561 Pain in right knee: Secondary | ICD-10-CM | POA: Diagnosis not present

## 2019-10-19 DIAGNOSIS — M5442 Lumbago with sciatica, left side: Secondary | ICD-10-CM | POA: Diagnosis not present

## 2019-10-19 DIAGNOSIS — M5441 Lumbago with sciatica, right side: Secondary | ICD-10-CM | POA: Diagnosis not present

## 2019-10-19 NOTE — Therapy (Signed)
[facility] [place], [name], [address] Phone: [phone]   Fax:  [phone]  Physical Therapy Treatment  Patient Details  Name: Jo Hale MRN: [id number] Date of Birth: Jun 06, 1960 Referring Provider (PT): Levy Pupa PA   Encounter Date: 10/19/2019   PT End of Session - 10/19/19 1008    Visit Number 9    Number of Visits 12    Date for PT Re-Evaluation 10/29/19    Authorization Type UHC primary, UHC medicare secondary, no auth, 60 VL combined PT/OT/ST 0 used    Authorization - Visit Number 9    Authorization - Number of Visits 60    Progress Note Due on Visit 10    PT Start Time 1003    PT Stop Time 1042    PT Time Calculation (min) 39 min    Activity Tolerance Patient tolerated treatment well;Patient limited by pain;No increased pain    Behavior During Therapy WFL for tasks assessed/performed           Past Medical History:  Diagnosis Date  . Abnormal pap   . Fibroids   . Fibroids, intramural    450-600 gm uterus  . GERD (gastroesophageal reflux disease)   . Hyperlipidemia   . Hypertension   . Menopause 08/27/2014  . Neoplasm of kidney   . PONV (postoperative nausea and vomiting)   . Posterior pain of right hip 07/31/2013  . Renal cancer, right ([place]) 05/2017  . TIA (transient ischemic attack) 2017 or 2018 unsure  . Vaginal Pap smear, abnormal     Past Surgical History:  Procedure Laterality Date  . CESAREAN SECTION    . COLONOSCOPY  02/19/2011   Fields-incomplete exam, otherwise normal   . COLONOSCOPY N/A 03/20/2018   Procedure: COLONOSCOPY;  Surgeon: Danie Binder, MD;  [facility];  Service: Endoscopy;  Laterality: N/A;  9:30  . KNEE ARTHROSCOPY WITH MEDIAL MENISECTOMY Right 04/27/2018   Procedure: RIGHT KNEE ARTHROSCOPY WITH PARTIAL MEDIAL MENISECTOMY;  Surgeon: Carole Civil, MD;  Location: [facility];  Service: Orthopedics;  Laterality: Right;  . LAPAROSCOPIC SUPRACERVICAL  HYSTERECTOMY  03/02/2011   Procedure: LAPAROSCOPIC SUPRACERVICAL HYSTERECTOMY;  Surgeon: Jonnie Kind, MD;  Location: [facility];  Service: Gynecology;  Laterality: N/A;  . PARTIAL HYSTERECTOMY    . ROBOT ASSISTED LAPAROSCOPIC NEPHRECTOMY Right 05/16/2017   Procedure: XI ROBOTIC ASSISTED LAPAROSCOPIC PARTIAL NEPHRECTOMY;  Surgeon: Raynelle Bring, MD;  Location: [facility];  Service: Urology;  Laterality: Right;  . ROTATOR CUFF REPAIR     left  . TUBAL LIGATION      There were no vitals filed for this visit.   Subjective Assessment - 10/19/19 1003    Subjective Pt stated she's feeling pretty good today.  Stated most difficulty getting comfortable at home.    Pertinent History chronic low back pain. R knee scope, TIA    Patient Stated Goals to have less pain    Currently in Pain? Yes    Pain Score 5     Pain Location Back    Pain Orientation Lower;Right;Left    Pain Descriptors / Indicators Tender    Pain Type Chronic pain    Pain Onset More than a month ago    Pain Frequency Intermittent    Aggravating Factors  at night trying to get comfortably, early morning    Pain Relieving Factors change positions, pillow under hips, medication, exercises    Effect of Pain on Daily Activities limits  [facility] Adult PT Treatment/Exercise - 10/19/19 0001      Exercises   Exercises Lumbar      Lumbar Exercises: Standing   Scapular Retraction Both;10 reps;Theraband    Theraband Level (Scapular Retraction) Level 2 (Red)    Row Both;Strengthening;10 reps;Theraband    Theraband Level (Row) Level 2 (Red)    Row Limitations 5"     Shoulder Extension Both;10 reps;Theraband    Theraband Level (Shoulder Extension) Level 2 (Red)    Other Standing Lumbar Exercises hip excursions 10 reps      Lumbar Exercises: Seated   Sit to Stand 10 reps    Sit to Stand Limitations glute activation, eccentric return    Other Seated Lumbar Exercises pelvic tilts posterior 10  reps      Lumbar Exercises: Supine   Pelvic Tilt 10 reps;10 seconds    Pelvic Tilt Limitations posterior    Dead Bug 10 reps;3 seconds    Dead Bug Limitations with ab set    Bridge 10 reps;Limitations    Bridge Limitations 2 sets, paired wiht breathing      Lumbar Exercises: Sidelying   Clam Both;10 reps;3 seconds    Hip Abduction Both;10 reps                    PT Short Term Goals - 09/20/19 0907      PT SHORT TERM GOAL #1   Title Patient will report at least 25% improvement in overall symptoms and/or functional ability.    Time 3    Period Weeks    Status On-going    Target Date 10/08/19      PT SHORT TERM GOAL #2   Title Patient will be independent in self management strategies to improve quality of life and functional outcomes.    Time 3    Period Weeks    Status On-going    Target Date 10/08/19      PT SHORT TERM GOAL #3   Title Patietn will be able to perform seated hip hinge with proper form 10x without dowel or verbal cues to demonstrate improved hip mobility in CKC    Time 3    Period Weeks    Status On-going    Target Date 10/08/19             PT Long Term Goals - 09/20/19 0908      PT LONG TERM GOAL #1   Title Patient will report at least 50% improvement in overall symptoms and/or functional ability.    Time 6    Period Weeks    Status On-going      PT LONG TERM GOAL #2   Title Patient will be able to demonstrate pain free lumbar motion in in 4/6 of the motions/directions (flexion/extension/SB R/SB L/ ROT R/ ROT L)    Time 6    Period Weeks    Status On-going      PT LONG TERM GOAL #3   Title Patient will report improved sleeping through the night (secondary to pain) to demonstrate improved sleep quality.    Time 6    Period Weeks    Status On-going                 Plan - 10/19/19 1029    Clinical Impression Statement Educated importance of good posture for back support, added theraband postureal strengthening with good  form following initial cueing.  Added dead bug, cueing for core activaitn prior movements for stability  with task.    Personal Factors and Comorbidities Comorbidity 1;Comorbidity 2    Comorbidities TIA, chronic low back pain    Examination-Activity Limitations Squat;Sleep;Sit;Locomotion Level;Transfers;Stand;Stairs    Examination-Participation Restrictions Cleaning;Shop;Community Activity;Driving;Meal Prep;Church    Stability/Clinical Decision Making Stable/Uncomplicated    Clinical Decision Making Low    Rehab Potential Good    PT Frequency 2x / week    PT Duration 6 weeks    PT Treatment/Interventions ADLs/Self Care Home Management;Aquatic Therapy;Cryotherapy;Electrical Stimulation;Iontophoresis 4mg /ml Dexamethasone;Moist Heat;Traction;Balance training;Therapeutic exercise;Therapeutic activities;Functional mobility training;Stair training;Gait training;DME Instruction;Neuromuscular re-education;Patient/family education;Manual techniques;Dry needling;Passive range of motion;Joint Manipulations    PT Next Visit Plan 10th visit progress continue to progress hip mobility and lumbar stability.  Progress to standing stability therex.    PT Home Exercise Plan 6/7 seated hip hinge with dowel; 6/21 diaphragmatic breathing           Patient will benefit from skilled therapeutic intervention in order to improve the following deficits and impairments:  Decreased activity tolerance, Pain, Decreased strength, Decreased mobility, Decreased range of motion  Visit Diagnosis: Muscle weakness (generalized)  Chronic midline low back pain with bilateral sciatica     Problem List Patient Active Problem List   Diagnosis Date Noted  . S/P right knee arthroscopy, medial menisectomy on 04/27/17 05/30/2018  . Derangement of posterior horn of medial meniscus of right knee   . Special screening for malignant neoplasms, colon   . Screening for colorectal cancer 09/01/2017  . Encounter for gynecological  examination with Papanicolaou smear of cervix 09/01/2017  . Lumbar spondylosis 07/13/2017  . Neoplasm of right kidney 05/16/2017  . Superficial fungus infection of skin 08/31/2016  . Hyperlipidemia LDL goal <100 09/18/2015  . History of stroke 09/18/2015  . Cerebral infarction ([place]) 07/02/2015  . Menopause 08/27/2014  . Meralgia paresthetica of left side 04/05/2014  . Venous stasis of both lower extremities 04/05/2014  . Radicular low back pain 11/07/2013  . Leg weakness, bilateral 11/07/2013  . Lumbago 10/09/2013  . Posterior pain of right hip 07/31/2013  . Esophageal reflux 01/25/2013  . Essential hypertension, benign 01/25/2013  . Rectal bleed 01/28/2012  . Constipation 01/28/2012  . Pain in joint, shoulder region 08/02/2011  . Muscle weakness (generalized) 08/02/2011  . Status post rotator cuff repair 08/02/2011  . FOOT PAIN 12/22/2009  . 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 POPLITEAL CYST, LEFT 06/30/2009  . MEDIAL MENISCUS TEAR, LEFT 06/30/2009   Ihor Austin, LPTA/CLT; CBIS [phone]  Aldona Lento 10/19/2019, 11:04 AM  [facility] [place], [name], [address] Phone: [phone]   Fax:  [phone]  Name: Jo Hale MRN: [id number] Date of Birth: 29-Jul-1960

## 2019-10-22 ENCOUNTER — Other Ambulatory Visit: Payer: Self-pay

## 2019-10-22 ENCOUNTER — Ambulatory Visit (HOSPITAL_COMMUNITY): Payer: 59 | Admitting: Physical Therapy

## 2019-10-22 ENCOUNTER — Encounter (HOSPITAL_COMMUNITY): Payer: Self-pay | Admitting: Physical Therapy

## 2019-10-22 DIAGNOSIS — M6281 Muscle weakness (generalized): Secondary | ICD-10-CM

## 2019-10-22 DIAGNOSIS — G8929 Other chronic pain: Secondary | ICD-10-CM | POA: Diagnosis not present

## 2019-10-22 DIAGNOSIS — M5441 Lumbago with sciatica, right side: Secondary | ICD-10-CM | POA: Diagnosis not present

## 2019-10-22 DIAGNOSIS — M5442 Lumbago with sciatica, left side: Secondary | ICD-10-CM | POA: Diagnosis not present

## 2019-10-22 DIAGNOSIS — M25561 Pain in right knee: Secondary | ICD-10-CM | POA: Diagnosis not present

## 2019-10-22 DIAGNOSIS — M25562 Pain in left knee: Secondary | ICD-10-CM | POA: Diagnosis not present

## 2019-10-22 NOTE — Therapy (Signed)
[facility] [address] [place], [name], [address] Phone: [phone]   Fax:  [phone]  Physical Therapy Treatment and Progress Note  Patient Details  Name: Jo Hale MRN: [id number] Date of Birth: January 01, 1961 Referring Provider (PT): Levy Pupa PA  Progress Note Reporting Period 09/17/19 to 10/22/19  See note below for Objective Data and Assessment of Progress/Goals.       Encounter Date: 10/22/2019   PT End of Session - 10/22/19 0835    Visit Number 10    Number of Visits 12    Date for PT Re-Evaluation 10/29/19    Authorization Type UHC primary, UHC medicare secondary, no auth, 60 VL combined PT/OT/ST 0 used    Authorization - Visit Number 10    Authorization - Number of Visits 60    Progress Note Due on Visit 20    PT Start Time [phone]    PT Stop Time 0913    PT Time Calculation (min) 38 min    Activity Tolerance Patient tolerated treatment well;Patient limited by pain;No increased pain    Behavior During Therapy WFL for tasks assessed/performed           Past Medical History:  Diagnosis Date  . Abnormal pap   . Fibroids   . Fibroids, intramural    450-600 gm uterus  . GERD (gastroesophageal reflux disease)   . Hyperlipidemia   . Hypertension   . Menopause 08/27/2014  . Neoplasm of kidney   . PONV (postoperative nausea and vomiting)   . Posterior pain of right hip 07/31/2013  . Renal cancer, right ([place]) 05/2017  . TIA (transient ischemic attack) 2017 or 2018 unsure  . Vaginal Pap smear, abnormal     Past Surgical History:  Procedure Laterality Date  . CESAREAN SECTION    . COLONOSCOPY  02/19/2011   Fields-incomplete exam, otherwise normal   . COLONOSCOPY N/A 03/20/2018   Procedure: COLONOSCOPY;  Surgeon: Danie Binder, MD;  [facility];  Service: Endoscopy;  Laterality: N/A;  9:30  . KNEE ARTHROSCOPY WITH MEDIAL MENISECTOMY Right 04/27/2018   Procedure: RIGHT KNEE ARTHROSCOPY WITH  PARTIAL MEDIAL MENISECTOMY;  Surgeon: Carole Civil, MD;  Location: [facility];  Service: Orthopedics;  Laterality: Right;  . LAPAROSCOPIC SUPRACERVICAL HYSTERECTOMY  03/02/2011   Procedure: LAPAROSCOPIC SUPRACERVICAL HYSTERECTOMY;  Surgeon: Jonnie Kind, MD;  Location: [facility];  Service: Gynecology;  Laterality: N/A;  . PARTIAL HYSTERECTOMY    . ROBOT ASSISTED LAPAROSCOPIC NEPHRECTOMY Right 05/16/2017   Procedure: XI ROBOTIC ASSISTED LAPAROSCOPIC PARTIAL NEPHRECTOMY;  Surgeon: Raynelle Bring, MD;  [facility];  Service: Urology;  Laterality: Right;  . ROTATOR CUFF REPAIR     left  . TUBAL LIGATION      There were no vitals filed for this visit.   Subjective Assessment - 10/22/19 0839    Subjective States that her pain is about 2/10, states that her pain is the same at night. States she is still waking up in the middle of the night, not as much but still about 2 times a night. At night pain is bad enough to wake her up, and she does her stretches and that helps a little.  States overall, she feels about 40% better.    Pertinent History chronic low back pain. R knee scope, TIA    Patient Stated Goals to have less pain    Currently in Pain? Yes    Pain Score 2  Pain Location Back    Pain Orientation Lower    Pain Onset More than a month ago              [facility] PT Assessment - 10/22/19 0001      Assessment   Medical Diagnosis LBP with Radicular pain    Referring Provider (PT) Levy Pupa PA      Observation/Other Assessments   Focus on Therapeutic Outcomes (FOTO)  34% limited - goal was 53% function - exceeded   was 56% limited,     AROM   Lumbar Flexion 50% limited   stretching in buttocks    Lumbar Extension 75% limited   pain across low back at L5/1   Lumbar - Right Side Bend 50% limited    pulling across low back    Lumbar - Left Side Bend 50% limited   pulling across low back                         [facility] Adult PT Treatment/Exercise - 10/22/19  0001      Lumbar Exercises: Supine   Other Supine Lumbar Exercises pevlic titls - tactile cues, diaphragmatic breathing and TRA activation - tactile and verbal cues to breath and to engage core- 20 minutes total                   PT Education - 10/22/19 0913    Education Details in FOTO score, in breathing mechanics, in TRA actication and how changing breathing mechanics will help with pain at night.    Person(s) Educated Patient    Methods Explanation    Comprehension Verbalized understanding            PT Short Term Goals - 09/20/19 0907      PT SHORT TERM GOAL #1   Title Patient will report at least 25% improvement in overall symptoms and/or functional ability.    Time 3    Period Weeks    Status On-going    Target Date 10/08/19      PT SHORT TERM GOAL #2   Title Patient will be independent in self management strategies to improve quality of life and functional outcomes.    Time 3    Period Weeks    Status On-going    Target Date 10/08/19      PT SHORT TERM GOAL #3   Title Patietn will be able to perform seated hip hinge with proper form 10x without dowel or verbal cues to demonstrate improved hip mobility in CKC    Time 3    Period Weeks    Status On-going    Target Date 10/08/19             PT Long Term Goals - 09/20/19 0908      PT LONG TERM GOAL #1   Title Patient will report at least 50% improvement in overall symptoms and/or functional ability.    Time 6    Period Weeks    Status On-going      PT LONG TERM GOAL #2   Title Patient will be able to demonstrate pain free lumbar motion in in 4/6 of the motions/directions (flexion/extension/SB R/SB L/ ROT R/ ROT L)    Time 6    Period Weeks    Status On-going      PT LONG TERM GOAL #3   Title Patient will report improved sleeping through the night (secondary to pain) to demonstrate improved  sleep quality.    Time 6    Period Weeks    Status On-going                 Plan - 10/22/19  0835    Clinical Impression Statement Session focused on educating patient on progress and reviewing breathing exercises as patient reports she continues to have poor ability to activate her core and breath.  Focused on TRA (upper with lower front rib approximation) activation with breathing. this was very challenging for patient. Verbal and tactile cues used, may need to revisit ability to engage TRA with focus on upper TRA to expand posterior lower ribs. Overall patient is improving and has met predicted FOTO score. Anticipate discharge at 12th visit pending patient presentation.    Personal Factors and Comorbidities Comorbidity 1;Comorbidity 2    Comorbidities TIA, chronic low back pain    Examination-Activity Limitations Squat;Sleep;Sit;Locomotion Level;Transfers;Stand;Stairs    Examination-Participation Restrictions Cleaning;Shop;Community Activity;Driving;Meal Prep;Church    Stability/Clinical Decision Making Stable/Uncomplicated    Rehab Potential Good    PT Frequency 2x / week    PT Duration 6 weeks    PT Treatment/Interventions ADLs/Self Care Home Management;Aquatic Therapy;Cryotherapy;Electrical Stimulation;Iontophoresis 81m/ml Dexamethasone;Moist Heat;Traction;Balance training;Therapeutic exercise;Therapeutic activities;Functional mobility training;Stair training;Gait training;DME Instruction;Neuromuscular re-education;Patient/family education;Manual techniques;Dry needling;Passive range of motion;Joint Manipulations    PT Next Visit Plan TRA activation - with lower rip approximation and breathing, continue to progress hip mobility and lumbar stability.  Progress to standing stability therex.    PT Home Exercise Plan 6/7 seated hip hinge with dowel; 6/21 diaphragmatic breathing           Patient will benefit from skilled therapeutic intervention in order to improve the following deficits and impairments:  Decreased activity tolerance, Pain, Decreased strength, Decreased mobility,  Decreased range of motion  Visit Diagnosis: Muscle weakness (generalized)  Chronic midline low back pain with bilateral sciatica     Problem List Patient Active Problem List   Diagnosis Date Noted  . S/P right knee arthroscopy, medial menisectomy on 04/27/17 05/30/2018  . Derangement of posterior horn of medial meniscus of right knee   . Special screening for malignant neoplasms, colon   . Screening for colorectal cancer 09/01/2017  . Encounter for gynecological examination with Papanicolaou smear of cervix 09/01/2017  . Lumbar spondylosis 07/13/2017  . Neoplasm of right kidney 05/16/2017  . Superficial fungus infection of skin 08/31/2016  . Hyperlipidemia LDL goal <100 09/18/2015  . History of stroke 09/18/2015  . Cerebral infarction ([place] 07/02/2015  . Menopause 08/27/2014  . Meralgia paresthetica of left side 04/05/2014  . Venous stasis of both lower extremities 04/05/2014  . Radicular low back pain 11/07/2013  . Leg weakness, bilateral 11/07/2013  . Lumbago 10/09/2013  . Posterior pain of right hip 07/31/2013  . Esophageal reflux 01/25/2013  . Essential hypertension, benign 01/25/2013  . Rectal bleed 01/28/2012  . Constipation 01/28/2012  . Pain in joint, shoulder region 08/02/2011  . Muscle weakness (generalized) 08/02/2011  . Status post rotator cuff repair 08/02/2011  . FOOT PAIN 12/22/2009  . 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 POPLITEAL CYST, LEFT 06/30/2009  . MEDIAL MENISCUS TEAR, LEFT 06/30/2009   9:19 AM, 10/22/19 MJerene Pitch DPT [facility] [phone]  [place] [address]Phone: [phone]  Fax:  [phone] Name: Jo VIRELLAMRN: [id number] of Birth: [birth year]

## 2019-10-24 ENCOUNTER — Other Ambulatory Visit: Payer: Self-pay

## 2019-10-24 ENCOUNTER — Ambulatory Visit (HOSPITAL_COMMUNITY): Payer: 59

## 2019-10-24 ENCOUNTER — Encounter (HOSPITAL_COMMUNITY): Payer: Self-pay

## 2019-10-24 DIAGNOSIS — M25562 Pain in left knee: Secondary | ICD-10-CM | POA: Diagnosis not present

## 2019-10-24 DIAGNOSIS — M5441 Lumbago with sciatica, right side: Secondary | ICD-10-CM | POA: Diagnosis not present

## 2019-10-24 DIAGNOSIS — G8929 Other chronic pain: Secondary | ICD-10-CM | POA: Diagnosis not present

## 2019-10-24 DIAGNOSIS — M6281 Muscle weakness (generalized): Secondary | ICD-10-CM | POA: Diagnosis not present

## 2019-10-24 DIAGNOSIS — M5442 Lumbago with sciatica, left side: Secondary | ICD-10-CM | POA: Diagnosis not present

## 2019-10-24 DIAGNOSIS — M25561 Pain in right knee: Secondary | ICD-10-CM | POA: Diagnosis not present

## 2019-10-24 NOTE — Therapy (Addendum)
[facility] [address] [place], [name], [address] Phone: [phone]   Fax:  [phone]  Physical Therapy Treatment and Discharge note  Patient Details  Name: Jo Hale MRN: [id number] Date of Birth: 31-Mar-1961 Referring Provider (PT): Levy Pupa PA   PHYSICAL THERAPY DISCHARGE SUMMARY  Visits from Start of Care: 11  Current functional level related to goals / functional outcomes: Unable to assess due to unplanned discharge   Remaining deficits: Unable to assess due to unplanned discharge  Education / Equipment: See below Plan: Patient agrees to discharge.  Patient goals were not met. Patient is being discharged due to not returning since the last visit.  ?????         Patient canceled last apt and did not return to therapy.   7:46 AM, 01/02/20 Jerene Pitch, DPT Physical Therapy with [facility]  [phone] office   Encounter Date: 10/24/2019   PT End of Session - 10/24/19 0848    Visit Number 11    Number of Visits 12    Date for PT Re-Evaluation 10/29/19    Authorization Type UHC primary, UHC medicare secondary, no auth, 60 VL combined PT/OT/ST 0 used    Authorization - Visit Number 11    Authorization - Number of Visits 60    Progress Note Due on Visit 20    PT Start Time [phone]   pt arrived late   PT Stop Time 0913    PT Time Calculation (min) 30 min    Activity Tolerance Patient tolerated treatment well    Behavior During Therapy [facility] for tasks assessed/performed           Past Medical History:  Diagnosis Date  . Abnormal pap   . Fibroids   . Fibroids, intramural    450-600 gm uterus  . GERD (gastroesophageal reflux disease)   . Hyperlipidemia   . Hypertension   . Menopause 08/27/2014  . Neoplasm of kidney   . PONV (postoperative nausea and vomiting)   . Posterior pain of right hip 07/31/2013  . Renal cancer, right ([facility]) 05/2017  . TIA (transient ischemic  attack) 2017 or 2018 unsure  . Vaginal Pap smear, abnormal     Past Surgical History:  Procedure Laterality Date  . CESAREAN SECTION    . COLONOSCOPY  02/19/2011   Fields-incomplete exam, otherwise normal   . COLONOSCOPY N/A 03/20/2018   Procedure: COLONOSCOPY;  Surgeon: Danie Binder, MD;  [facility];  Service: Endoscopy;  Laterality: N/A;  9:30  . KNEE ARTHROSCOPY WITH MEDIAL MENISECTOMY Right 04/27/2018   Procedure: RIGHT KNEE ARTHROSCOPY WITH PARTIAL MEDIAL MENISECTOMY;  Surgeon: Carole Civil, MD;  Location: [facility] ORS;  Service: Orthopedics;  Laterality: Right;  . LAPAROSCOPIC SUPRACERVICAL HYSTERECTOMY  03/02/2011   Procedure: LAPAROSCOPIC SUPRACERVICAL HYSTERECTOMY;  Surgeon: Jonnie Kind, MD;  Location: [facility] ORS;  Service: Gynecology;  Laterality: N/A;  . PARTIAL HYSTERECTOMY    . ROBOT ASSISTED LAPAROSCOPIC NEPHRECTOMY Right 05/16/2017   Procedure: XI ROBOTIC ASSISTED LAPAROSCOPIC PARTIAL NEPHRECTOMY;  Surgeon: Raynelle Bring, MD;  Location: [facility] ORS;  Service: Urology;  Laterality: Right;  . ROTATOR CUFF REPAIR     left  . TUBAL LIGATION      There were no vitals filed for this visit.   Subjective Assessment - 10/24/19 0847    Subjective Pt stated she is feeling good this morning, no reports of pain currently.    Pertinent History chronic low back pain.  R knee scope, TIA    Patient Stated Goals to have less pain    Currently in Pain? No/denies                             [facility] Adult PT Treatment/Exercise - 10/24/19 0001      Lumbar Exercises: Supine   Pelvic Tilt 3 reps;10 seconds   paired with breathing for rib approximation paired with pelv   Pelvic Tilt Limitations posterior    Other Supine Lumbar Exercises breathing x 10 breaths; TRA for rib approximation with forceful exhalation (verbal and tactile cueing)    Other Supine Lumbar Exercises pevlic titls - tactile cues, diaphragmatic breathing and TRA activation - tactile and verbal  cues to breath and to engage core- 20 minutes total       Lumbar Exercises: Quadruped   Opposite Arm/Leg Raise Right arm/Left leg;Left arm/Right leg;5 reps;3 seconds    Other Quadruped Lumbar Exercises rib expansion    Other Quadruped Lumbar Exercises TRA activation praired with exhalation                    PT Short Term Goals - 09/20/19 6060      PT SHORT TERM GOAL #1   Title Patient will report at least 25% improvement in overall symptoms and/or functional ability.    Time 3    Period Weeks    Status On-going    Target Date 10/08/19      PT SHORT TERM GOAL #2   Title Patient will be independent in self management strategies to improve quality of life and functional outcomes.    Time 3    Period Weeks    Status On-going    Target Date 10/08/19      PT SHORT TERM GOAL #3   Title Patietn will be able to perform seated hip hinge with proper form 10x without dowel or verbal cues to demonstrate improved hip mobility in CKC    Time 3    Period Weeks    Status On-going    Target Date 10/08/19             PT Long Term Goals - 09/20/19 0908      PT LONG TERM GOAL #1   Title Patient will report at least 50% improvement in overall symptoms and/or functional ability.    Time 6    Period Weeks    Status On-going      PT LONG TERM GOAL #2   Title Patient will be able to demonstrate pain free lumbar motion in in 4/6 of the motions/directions (flexion/extension/SB R/SB L/ ROT R/ ROT L)    Time 6    Period Weeks    Status On-going      PT LONG TERM GOAL #3   Title Patient will report improved sleeping through the night (secondary to pain) to demonstrate improved sleep quality.    Time 6    Period Weeks    Status On-going                 Plan - 10/24/19 0850    Clinical Impression Statement Session focus with abdominal activation for lumbar supprt.  Pt required moderate to max verbal and tactile cueing to improved TRA activation with rib approximation and  pelvic tilt.  Was difficulty to activate and demonstrated weakness wiht inability to hold for 5" prior fatigue.  No reports of pain through session.  Personal Factors and Comorbidities Comorbidity 1;Comorbidity 2    Comorbidities TIA, chronic low back pain    Examination-Activity Limitations Squat;Sleep;Sit;Locomotion Level;Transfers;Stand;Stairs    Examination-Participation Restrictions Cleaning;Shop;Community Activity;Driving;Meal Prep;Church    Stability/Clinical Decision Making Stable/Uncomplicated    Clinical Decision Making Low    Rehab Potential Good    PT Frequency 2x / week    PT Duration 6 weeks    PT Treatment/Interventions ADLs/Self Care Home Management;Aquatic Therapy;Cryotherapy;Electrical Stimulation;Iontophoresis 68m/ml Dexamethasone;Moist Heat;Traction;Balance training;Therapeutic exercise;Therapeutic activities;Functional mobility training;Stair training;Gait training;DME Instruction;Neuromuscular re-education;Patient/family education;Manual techniques;Dry needling;Passive range of motion;Joint Manipulations    PT Next Visit Plan DC to HEP next session.  Review goals and assure advanced HEP.  TRA activation - with lower rip approximation and breathing, continue to progress hip mobility and lumbar stability.  Progress to standing stability therex.    PT Home Exercise Plan 6/7 seated hip hinge with dowel; 6/21 diaphragmatic breathing           Patient will benefit from skilled therapeutic intervention in order to improve the following deficits and impairments:  Decreased activity tolerance, Pain, Decreased strength, Decreased mobility, Decreased range of motion  Visit Diagnosis: Muscle weakness (generalized)  Chronic midline low back pain with bilateral sciatica     Problem List Patient Active Problem List   Diagnosis Date Noted  . S/P right knee arthroscopy, medial menisectomy on 04/27/17 05/30/2018  . Derangement of posterior horn of medial meniscus of right knee     . Special screening for malignant neoplasms, colon   . Screening for colorectal cancer 09/01/2017  . Encounter for gynecological examination with Papanicolaou smear of cervix 09/01/2017  . Lumbar spondylosis 07/13/2017  . Neoplasm of right kidney 05/16/2017  . Superficial fungus infection of skin 08/31/2016  . Hyperlipidemia LDL goal <100 09/18/2015  . History of stroke 09/18/2015  . Cerebral infarction (HFisher 07/02/2015  . Menopause 08/27/2014  . Meralgia paresthetica of left side 04/05/2014  . Venous stasis of both lower extremities 04/05/2014  . Radicular low back pain 11/07/2013  . Leg weakness, bilateral 11/07/2013  . Lumbago 10/09/2013  . Posterior pain of right hip 07/31/2013  . Esophageal reflux 01/25/2013  . Essential hypertension, benign 01/25/2013  . Rectal bleed 01/28/2012  . Constipation 01/28/2012  . Pain in joint, shoulder region 08/02/2011  . Muscle weakness (generalized) 08/02/2011  . Status post rotator cuff repair 08/02/2011  . FOOT PAIN 12/22/2009  . 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 POPLITEAL CYST, LEFT 06/30/2009  . MEDIAL MENISCUS TEAR, LEFT 06/30/2009   CIhor Austin LPTA/CLT; CBIS [phone] CAldona Lento7/14/2021, 9:18 AM  [place] [address]Phone: [phone]  Fax:  [phone] Name: Jo HINSCHMRN: [id number] of Birth: 8May 23, 1962

## 2019-10-29 ENCOUNTER — Telehealth (HOSPITAL_COMMUNITY): Payer: Self-pay

## 2019-10-29 ENCOUNTER — Encounter (HOSPITAL_COMMUNITY): Payer: 59

## 2019-10-29 NOTE — Telephone Encounter (Signed)
PT left message appointment this morning at 8:30 was last scheduled appointment. Please call if any further visits needed. Plan for this visit was to review HEP, progress HEP as needed and DC to independent HEP.   Christos Mixson Hartnett-Rands, PT

## 2019-10-31 ENCOUNTER — Encounter (HOSPITAL_COMMUNITY): Payer: 59 | Admitting: Physical Therapy

## 2019-12-20 ENCOUNTER — Telehealth: Payer: Self-pay | Admitting: Family Medicine

## 2019-12-20 DIAGNOSIS — E785 Hyperlipidemia, unspecified: Secondary | ICD-10-CM

## 2019-12-20 DIAGNOSIS — Z1329 Encounter for screening for other suspected endocrine disorder: Secondary | ICD-10-CM

## 2019-12-20 DIAGNOSIS — Z79899 Other long term (current) drug therapy: Secondary | ICD-10-CM

## 2019-12-20 DIAGNOSIS — I10 Essential (primary) hypertension: Secondary | ICD-10-CM

## 2019-12-20 NOTE — Telephone Encounter (Signed)
Patient has physical in October and needing labs done.

## 2019-12-20 NOTE — Telephone Encounter (Signed)
Last labs completed 05/23/19 TSH, BMET, CBC, HEPATIC, LIPID. Please advise. Thank you

## 2019-12-20 NOTE — Telephone Encounter (Signed)
Yes, pls order those lab. Thx. Dr. Darene Lamer

## 2019-12-20 NOTE — Telephone Encounter (Signed)
Lab orders placed and pt is aware. Labs mailed to patient as reminder

## 2019-12-21 ENCOUNTER — Encounter: Payer: Self-pay | Admitting: Emergency Medicine

## 2019-12-21 ENCOUNTER — Other Ambulatory Visit: Payer: Self-pay

## 2019-12-21 ENCOUNTER — Ambulatory Visit
Admission: EM | Admit: 2019-12-21 | Discharge: 2019-12-21 | Disposition: A | Discharge status: Home / Self Care | Payer: Medicare Other | Attending: Emergency Medicine | Admitting: Emergency Medicine

## 2019-12-21 DIAGNOSIS — J069 Acute upper respiratory infection, unspecified: Secondary | ICD-10-CM | POA: Diagnosis not present

## 2019-12-21 DIAGNOSIS — Z1152 Encounter for screening for COVID-19: Secondary | ICD-10-CM | POA: Diagnosis not present

## 2019-12-21 MED ORDER — CETIRIZINE HCL 10 MG PO TABS: 10.0000 mg | ORAL_TABLET | Freq: Every day | ORAL | 0 refills | Status: DC

## 2019-12-21 MED ORDER — BENZONATATE 100 MG PO CAPS
100.0000 mg | ORAL_CAPSULE | Freq: Three times a day (TID) | ORAL | 0 refills | Status: DC
Start: 2019-12-21 — End: 2020-01-29

## 2019-12-21 MED ORDER — FLUTICASONE PROPIONATE 50 MCG/ACT NA SUSP
1.0000 | Freq: Every day | NASAL | 0 refills | Status: DC
Start: 2019-12-21 — End: 2021-11-26

## 2019-12-21 NOTE — Discharge Instructions (Signed)
COVID testing ordered.  It will take between 2-7 days for test results.  Someone will contact you regarding abnormal results.    In the meantime: You should remain isolated in your home for 10 days from symptom onset AND greater than 24 hours after symptoms resolution (absence of fever without the use of fever-reducing medication and improvement in respiratory symptoms), whichever is longer Get plenty of rest and push fluids Tessalon Perles prescribed for cough Zyrtec for nasal congestion, runny nose, and/or sore throat Flonase for nasal congestion and runny nose Use medications daily for symptom relief Use OTC medications like ibuprofen or tylenol as needed fever or pain Call or go to the ED if you have any new or worsening symptoms such as fever, worsening cough, shortness of breath, chest tightness, chest pain, turning blue, changes in mental status, etc...  

## 2019-12-21 NOTE — ED Provider Notes (Signed)
[facility]   [id number] 12/21/19 Arrival Time: 4650   CC: COVID symptoms  SUBJECTIVE: History from: patient.  Jo Hale is a 59 y.o. female who presents to the urgent care with a complaint of sore throat, nasal congestion and dry cough for the past 2 days.  Denies sick exposure to COVID, flu or strep.  Denies recent travel.  Has tried OTC medication with no relief.  Denies aggravating factors.  Denies previous symptoms in the past.   Denies fever, chills, fatigue, sinus pain, rhinorrhea, SOB, wheezing, chest pain, nausea, changes in bowel or bladder habits.     ROS: As per HPI.  All other pertinent ROS negative.     Past Medical History:  Diagnosis Date   Abnormal pap    Fibroids    Fibroids, intramural    450-600 gm uterus   GERD (gastroesophageal reflux disease)    Hyperlipidemia    Hypertension    Menopause 08/27/2014   Neoplasm of kidney    PONV (postoperative nausea and vomiting)    Posterior pain of right hip 07/31/2013   Renal cancer, right ([facility]) 05/2017   TIA (transient ischemic attack) 2017 or 2018 unsure   Vaginal Pap smear, abnormal    Past Surgical History:  Procedure Laterality Date   CESAREAN SECTION     COLONOSCOPY  02/19/2011   Fields-incomplete exam, otherwise normal    COLONOSCOPY N/A 03/20/2018   Procedure: COLONOSCOPY;  Surgeon: Danie Binder, MD;  [facility];  Service: Endoscopy;  Laterality: N/A;  9:30   KNEE ARTHROSCOPY WITH MEDIAL MENISECTOMY Right 04/27/2018   Procedure: RIGHT KNEE ARTHROSCOPY WITH PARTIAL MEDIAL MENISECTOMY;  Surgeon: Carole Civil, MD;  Location: [facility] ORS;  Service: Orthopedics;  Laterality: Right;   LAPAROSCOPIC SUPRACERVICAL HYSTERECTOMY  03/02/2011   Procedure: LAPAROSCOPIC SUPRACERVICAL HYSTERECTOMY;  Surgeon: Jonnie Kind, MD;  Location: [facility]S;  Service: Gynecology;  Laterality: N/A;   PARTIAL HYSTERECTOMY     ROBOT ASSISTED LAPAROSCOPIC NEPHRECTOMY Right 05/16/2017     Procedure: XI ROBOTIC ASSISTED LAPAROSCOPIC PARTIAL NEPHRECTOMY;  Surgeon: Raynelle Bring, MD;  Location: [facility];  Service: Urology;  Laterality: Right;   ROTATOR CUFF REPAIR     left   TUBAL LIGATION     Allergies  Allergen Reactions   Adhesive [Tape] Rash   No current facility-administered medications on file prior to encounter.   Current Outpatient Medications on File Prior to Encounter  Medication Sig Dispense Refill   atorvastatin (LIPITOR) 20 MG tablet Take 1 tablet (20 mg total) by mouth daily. 90 tablet 1   diphenhydrAMINE (BENADRYL) 25 mg capsule Take 25 mg by mouth daily as needed for allergies.      gabapentin (NEURONTIN) 300 MG capsule TAKE (1) CAPSULE BY MOUTH3AT BEDTIME.     HYDROcodone-acetaminophen (NORCO/VICODIN) 5-325 MG tablet Take 1 tablet by mouth 2 (two) times daily.     hydrocortisone 2.5 % cream Apply a thin layer bid for 7 days to affected area 45 g 0   Omega-3 Fatty Acids (OMEGA-3 PO) Take 2 capsules by mouth daily.      pantoprazole (PROTONIX) 40 MG tablet Take one tablet po daily as needed 90 tablet 1   predniSONE (DELTASONE) 10 MG tablet Take 3 tab p.o. x2 days, then 2 tab x 2 days, then 1 tab x2 days.  Take with food. 12 tablet 0   triamterene-hydrochlorothiazide (MAXZIDE-25) 37.5-25 MG tablet Take 1 tablet by mouth daily. 90 tablet 1   Social History  Socioeconomic History   Marital status: Married    Spouse name: Not on file   Number of children: 2   Years of education: Not on file   Highest education level: Not on file  Occupational History   Occupation: forklift Op, Emergency planning/management officer    Employer: [employer]  Tobacco Use   Smoking status: Never Smoker   Smokeless tobacco: Never Used  Scientific laboratory technician Use: Never used  Substance and Sexual Activity   Alcohol use: No   Drug use: No   Sexual activity: Not Currently    Birth control/protection: Surgical    Comment: hyst  Other Topics Concern   Not on file  Social  History Narrative   Not on file   Social Determinants of Health   Financial Resource Strain:    Difficulty of Paying Living Expenses: Not on file  Food Insecurity:    Worried About Charity fundraiser in the Last Year: Not on file   [employer] of Food in the Last Year: Not on file  Transportation Needs:    Lack of Transportation (Medical): Not on file   Lack of Transportation (Non-Medical): Not on file  Physical Activity:    Days of Exercise per Week: Not on file   Minutes of Exercise per Session: Not on file  Stress:    Feeling of Stress : Not on file  Social Connections:    Frequency of Communication with Friends and Family: Not on file   Frequency of Social Gatherings with Friends and Family: Not on file   Attends Religious Services: Not on file   Active Member of Clubs or Organizations: Not on file   Attends Archivist Meetings: Not on file   Marital Status: Not on file  Intimate Partner Violence:    Fear of Current or Ex-Partner: Not on file   Emotionally Abused: Not on file   Physically Abused: Not on file   Sexually Abused: Not on file   Family History  Problem Relation Age of Onset   Heart disease Mother    Dementia Mother    Stroke Mother    Congestive Heart Failure Father    Cancer Father    Mental illness Brother    Early death Sister    Alcohol abuse Sister    Stroke Sister    Mental illness Brother    Mental illness Brother    Colon cancer Neg Hx     OBJECTIVE:  Vitals:   12/21/19 0836 12/21/19 0838 12/21/19 0840  BP:   138/74  Pulse: 67    Resp: 19    Temp: 98.3 F (36.8 C)    TempSrc: Oral    SpO2: 99%    Weight:  235 lb (106.6 kg)   Height:  5\' 2"  (1.575 m)      General appearance: alert; appears fatigued, but nontoxic; speaking in full sentences and tolerating own secretions HEENT: NCAT; Ears: EACs clear, TMs pearly gray; Eyes: PERRL.  EOM grossly intact. Sinuses: nontender; Nose: nares patent  without rhinorrhea, Throat: oropharynx clear, tonsils non erythematous or enlarged, uvula midline  Neck: supple without LAD Lungs: unlabored respirations, symmetrical air entry; cough: mild; no respiratory distress; CTAB Heart: regular rate and rhythm.  Radial pulses 2+ symmetrical bilaterally Skin: warm and dry Psychological: alert and cooperative; normal mood and affect  LABS:  No results found for this or any previous visit (from the past 24 hour(s)).   ASSESSMENT & PLAN:  1. Viral URI  with cough   2. Encounter for screening for COVID-19     Meds ordered this encounter  Medications   fluticasone (FLONASE) 50 MCG/ACT nasal spray    Sig: Place 1 spray into both nostrils daily for 14 days.    Dispense:  16 g    Refill:  0   cetirizine (ZYRTEC ALLERGY) 10 MG tablet    Sig: Take 1 tablet (10 mg total) by mouth daily.    Dispense:  30 tablet    Refill:  0   benzonatate (TESSALON) 100 MG capsule    Sig: Take 1 capsule (100 mg total) by mouth every 8 (eight) hours.    Dispense:  30 capsule    Refill:  0    Discharge Instructions.    COVID testing ordered.  It will take between 2-7 days for test results.  Someone will contact you regarding abnormal results.    In the meantime: You should remain isolated in your home for 10 days from symptom onset AND greater than 24 hours after symptoms resolution (absence of fever without the use of fever-reducing medication and improvement in respiratory symptoms), whichever is longer Get plenty of rest and push fluids Tessalon Perles prescribed for cough Zyrtec for nasal congestion, runny nose, and/or sore throat Flonase for nasal congestion and runny nose Use medications daily for symptom relief Use OTC medications like ibuprofen or tylenol as needed fever or pain Call or go to the ED if you have any new or worsening symptoms such as fever, worsening cough, shortness of breath, chest tightness, chest pain, turning blue, changes in mental  status, etc...   Reviewed expectations re: course of current medical issues. Questions answered. Outlined signs and symptoms indicating need for more acute intervention. Patient verbalized understanding. After Visit Summary given.      Note: This document was prepared using Dragon voice recognition software and may include unintentional dictation errors.    Emerson Monte, FNP 12/21/19 [phone]

## 2019-12-21 NOTE — ED Triage Notes (Signed)
Scratchy throat and dry cough x 2 days

## 2019-12-25 LAB — NOVEL CORONAVIRUS, NAA: SARS-CoV-2, NAA: NOT DETECTED

## 2020-01-01 ENCOUNTER — Other Ambulatory Visit: Payer: Self-pay | Admitting: *Deleted

## 2020-01-01 MED ORDER — TRIAMTERENE-HCTZ 37.5-25 MG PO TABS: 1.0000 | ORAL_TABLET | Freq: Every day | ORAL | 0 refills | Status: DC

## 2020-01-01 MED ORDER — PANTOPRAZOLE SODIUM 40 MG PO TBEC: DELAYED_RELEASE_TABLET | ORAL | 0 refills | Status: DC

## 2020-01-03 ENCOUNTER — Other Ambulatory Visit: Payer: Self-pay | Admitting: *Deleted

## 2020-01-03 MED ORDER — ATORVASTATIN CALCIUM 20 MG PO TABS
20.0000 mg | ORAL_TABLET | Freq: Every day | ORAL | 0 refills | Status: DC
Start: 2020-01-03 — End: 2020-01-29

## 2020-01-14 DIAGNOSIS — E785 Hyperlipidemia, unspecified: Secondary | ICD-10-CM | POA: Diagnosis not present

## 2020-01-14 DIAGNOSIS — I10 Essential (primary) hypertension: Secondary | ICD-10-CM | POA: Diagnosis not present

## 2020-01-14 DIAGNOSIS — Z1329 Encounter for screening for other suspected endocrine disorder: Secondary | ICD-10-CM | POA: Diagnosis not present

## 2020-01-14 DIAGNOSIS — Z79899 Other long term (current) drug therapy: Secondary | ICD-10-CM | POA: Diagnosis not present

## 2020-01-15 LAB — CBC WITH DIFFERENTIAL/PLATELET
Basophils Absolute: 0 10*3/uL (ref 0.0–0.2)
Basos: 1 %
EOS (ABSOLUTE): 0.1 10*3/uL (ref 0.0–0.4)
Eos: 3 %
Hematocrit: 40.7 % (ref 34.0–46.6)
Hemoglobin: 13.2 g/dL (ref 11.1–15.9)
Immature Grans (Abs): 0 10*3/uL (ref 0.0–0.1)
Immature Granulocytes: 0 %
Lymphocytes Absolute: 1.6 10*3/uL (ref 0.7–3.1)
Lymphs: 35 %
MCH: 30.6 pg (ref 26.6–33.0)
MCHC: 32.4 g/dL (ref 31.5–35.7)
MCV: 94 fL (ref 79–97)
Monocytes Absolute: 0.5 10*3/uL (ref 0.1–0.9)
Monocytes: 11 %
Neutrophils Absolute: 2.3 10*3/uL (ref 1.4–7.0)
Neutrophils: 50 %
Platelets: 246 10*3/uL (ref 150–450)
RBC: 4.32 x10E6/uL (ref 3.77–5.28)
RDW: 12.7 % (ref 11.7–15.4)
WBC: 4.6 10*3/uL (ref 3.4–10.8)

## 2020-01-15 LAB — BASIC METABOLIC PANEL
BUN/Creatinine Ratio: 10 (ref 9–23)
BUN: 12 mg/dL (ref 6–24)
CO2: 26 mmol/L (ref 20–29)
Calcium: 9.2 mg/dL (ref 8.7–10.2)
Chloride: 104 mmol/L (ref 96–106)
Creatinine, Ser: 1.23 mg/dL — ABNORMAL HIGH (ref 0.57–1.00)
GFR calc Af Amer: 55 mL/min/{1.73_m2} — ABNORMAL LOW (ref >59)
GFR calc non Af Amer: 48 mL/min/{1.73_m2} — ABNORMAL LOW (ref >59)
Glucose: 98 mg/dL (ref 65–99)
Potassium: 3.8 mmol/L (ref 3.5–5.2)
Sodium: 142 mmol/L (ref 134–144)

## 2020-01-15 LAB — HEPATIC FUNCTION PANEL
ALT: 24 IU/L (ref 0–32)
AST: 21 IU/L (ref 0–40)
Albumin: 4.1 g/dL (ref 3.8–4.9)
Alkaline Phosphatase: 113 IU/L (ref 44–121)
Bilirubin Total: 0.3 mg/dL (ref 0.0–1.2)
Bilirubin, Direct: 0.1 mg/dL (ref 0.00–0.40)
Total Protein: 6.6 g/dL (ref 6.0–8.5)

## 2020-01-15 LAB — LIPID PANEL
Chol/HDL Ratio: 2.4 ratio (ref 0.0–4.4)
Cholesterol, Total: 133 mg/dL (ref 100–199)
HDL: 55 mg/dL (ref >39)
LDL Chol Calc (NIH): 64 mg/dL (ref 0–99)
Triglycerides: 70 mg/dL (ref 0–149)
VLDL Cholesterol Cal: 14 mg/dL (ref 5–40)

## 2020-01-15 LAB — TSH: TSH: 1.31 u[IU]/mL (ref 0.450–4.500)

## 2020-01-29 ENCOUNTER — Encounter: Payer: Self-pay | Admitting: Family Medicine

## 2020-01-29 ENCOUNTER — Other Ambulatory Visit: Payer: Self-pay

## 2020-01-29 ENCOUNTER — Ambulatory Visit (INDEPENDENT_AMBULATORY_CARE_PROVIDER_SITE_OTHER): Payer: Medicare Other | Admitting: Family Medicine

## 2020-01-29 VITALS — BP 132/84 | HR 67 | Temp 94.7°F | Ht 62.0 in | Wt 244.4 lb

## 2020-01-29 DIAGNOSIS — M5441 Lumbago with sciatica, right side: Secondary | ICD-10-CM

## 2020-01-29 DIAGNOSIS — K219 Gastro-esophageal reflux disease without esophagitis: Secondary | ICD-10-CM

## 2020-01-29 DIAGNOSIS — I10 Essential (primary) hypertension: Secondary | ICD-10-CM

## 2020-01-29 DIAGNOSIS — Z8673 Personal history of transient ischemic attack (TIA), and cerebral infarction without residual deficits: Secondary | ICD-10-CM

## 2020-01-29 DIAGNOSIS — Z0000 Encounter for general adult medical examination without abnormal findings: Secondary | ICD-10-CM | POA: Diagnosis not present

## 2020-01-29 DIAGNOSIS — G8929 Other chronic pain: Secondary | ICD-10-CM

## 2020-01-29 DIAGNOSIS — E785 Hyperlipidemia, unspecified: Secondary | ICD-10-CM

## 2020-01-29 MED ORDER — TRIAMTERENE-HCTZ 37.5-25 MG PO TABS: 1.0000 | ORAL_TABLET | Freq: Every day | ORAL | 1 refills | Status: DC

## 2020-01-29 MED ORDER — ATORVASTATIN CALCIUM 20 MG PO TABS: 20.0000 mg | ORAL_TABLET | Freq: Every day | ORAL | 1 refills | Status: DC

## 2020-01-29 NOTE — Progress Notes (Signed)
Patient ID: Jo Hale, female    DOB: 1960-11-17, 59 y.o.   MRN: [id number]   Chief Complaint  Patient presents with  . Annual Exam   Subjective:    HPI  The patient comes in today for a wellness visit.    A review of their health history was completed.  A review of medications was also completed.  Any needed refills; Triamterene 37.5-25 mg  Eating habits: healthy   Falls/  MVA accidents in past few months: none  Regular exercise: physically active   Specialist pt sees on regular basis: sees Jo Hale at [facility]  Preventative health issues were discussed.   Additional concerns: none  Needing refill on bp meds.  - [facility] 3 mo supply.  Used to see nephrologist, cr 1.1-1.3, ranging. Has appt coming up with them for 24mo check. Surgery on rt kidney, h/o neoplasm on rt kidney.  No chemo or radiation.  H/o CVA in 2017, no residual deficits.  Had some speech concerns but has improved.  Pt seen for annual wellness exam. Pt seeing gyn with [facility].  Hasn't been in 2 yrs.  Up to date on mammogram in 6/21.  Pt stating had h/o partial hysterectomy, not due for pap till 2022.  Pt has seen ortho for [place], ortho.  Has appt to see them tomorrow. Has h/o kidney issues, so not on nsaids. Lower back pain radiates to front thigh on left.  And radiates down the buttocks bilaterally. Saw PT in past for this. Last injection last year.  Helped for 1-2 wks and improved.  2016- mri lumbar- L5-S1 moderate to severe rt facet arthritis  Also seen L3-l4 small disc buldge , no impingement. Had h/o surgery on rt knee.  Left knee pain now.  Chronic low back pain- Was on gabapentin 300mg  and norco in past was on this.  gerd- Taking protonix prn.  Not taking daily. Gerd, stable.  Not having many issues currently.  If flares up she takes her protonix.   Medical History Jo Hale has a past medical history of Abnormal pap, Fibroids, Fibroids, intramural,  GERD (gastroesophageal reflux disease), Hyperlipidemia, Hypertension, Menopause (08/27/2014), Neoplasm of kidney, PONV (postoperative nausea and vomiting), Posterior pain of right hip (07/31/2013), Renal cancer, right ([place]) (05/2017), TIA (transient ischemic attack) (2017 or 2018 unsure), and Vaginal Pap smear, abnormal.   Outpatient Encounter Medications as of 01/29/2020  Medication Sig  . atorvastatin (LIPITOR) 20 MG tablet Take 1 tablet (20 mg total) by mouth daily.  . cetirizine (ZYRTEC ALLERGY) 10 MG tablet Take 1 tablet (10 mg total) by mouth daily.  . diphenhydrAMINE (BENADRYL) 25 mg capsule Take 25 mg by mouth daily as needed for allergies.   Marland Kitchen gabapentin (NEURONTIN) 300 MG capsule TAKE (1) CAPSULE BY MOUTH3AT BEDTIME.  Marland Kitchen HYDROcodone-acetaminophen (NORCO/VICODIN) 5-325 MG tablet Take 1 tablet by mouth 2 (two) times daily.  . hydrocortisone 2.5 % cream Apply a thin layer bid for 7 days to affected area  . Omega-3 Fatty Acids (OMEGA-3 PO) Take 2 capsules by mouth daily.   . pantoprazole (PROTONIX) 40 MG tablet Take one tablet po daily as needed  . triamterene-hydrochlorothiazide (MAXZIDE-25) 37.5-25 MG tablet Take 1 tablet by mouth daily.  . [DISCONTINUED] atorvastatin (LIPITOR) 20 MG tablet Take 1 tablet (20 mg total) by mouth daily.  . [DISCONTINUED] triamterene-hydrochlorothiazide (MAXZIDE-25) 37.5-25 MG tablet Take 1 tablet by mouth daily.  . fluticasone (FLONASE) 50 MCG/ACT nasal spray Place 1 spray into both nostrils daily  for 14 days.  . [DISCONTINUED] benzonatate (TESSALON) 100 MG capsule Take 1 capsule (100 mg total) by mouth every 8 (eight) hours.  . [DISCONTINUED] predniSONE (DELTASONE) 10 MG tablet Take 3 tab p.o. x2 days, then 2 tab x 2 days, then 1 tab x2 days.  Take with food.   No facility-administered encounter medications on file as of 01/29/2020.     Review of Systems  Constitutional: Negative for chills and fever.  HENT: Negative for congestion, rhinorrhea and sore  throat.   Respiratory: Negative for cough, shortness of breath and wheezing.   Cardiovascular: Negative for chest pain and leg swelling.  Gastrointestinal: Negative for abdominal pain, diarrhea, nausea and vomiting.  Genitourinary: Negative for dysuria and frequency.  Musculoskeletal: Positive for back pain (chronic). Negative for arthralgias.  Skin: Negative for rash.  Neurological: Negative for dizziness, weakness and headaches.     Vitals BP 132/84   Pulse 67   Temp (!) 94.7 F (34.8 C)   Ht 5\' 2"  (1.575 m)   Wt 244 lb 6.4 oz (110.9 kg)   LMP 02/17/2011 Comment: partial   SpO2 99%   BMI 44.70 kg/m   Objective:   Physical Exam Vitals and nursing note reviewed.  Constitutional:      General: She is not in acute distress.    Appearance: Normal appearance. She is obese. She is not ill-appearing.  HENT:     Head: Normocephalic and atraumatic.     Right Ear: Tympanic membrane and external ear normal.     Left Ear: Tympanic membrane and external ear normal.     Ears:     Comments: +wax in bilatera EAC, non-impacted    Nose: Nose normal. No congestion or rhinorrhea.     Mouth/Throat:     Mouth: Mucous membranes are moist.     Pharynx: Oropharynx is clear. No oropharyngeal exudate or posterior oropharyngeal erythema.  Eyes:     Extraocular Movements: Extraocular movements intact.     Conjunctiva/sclera: Conjunctivae normal.     Pupils: Pupils are equal, round, and reactive to light.  Cardiovascular:     Rate and Rhythm: Normal rate and regular rhythm.     Pulses: Normal pulses.     Heart sounds: Normal heart sounds. No murmur heard.   Pulmonary:     Effort: Pulmonary effort is normal. No respiratory distress.     Breath sounds: Normal breath sounds. No wheezing, rhonchi or rales.  Abdominal:     General: Abdomen is flat. Bowel sounds are normal. There is no distension.     Palpations: Abdomen is soft. There is no mass.     Tenderness: There is no abdominal tenderness.  There is no guarding or rebound.     Hernia: No hernia is present.  Musculoskeletal:        General: Normal range of motion.     Right lower leg: No edema.     Left lower leg: No edema.     Comments: +ttp over SI joint bilaterally.  No spinous process tenderness in T or L spine.  Dec rom with flexion. Neg SLR bilaterally. Normal sensation in lower ext.  Skin:    General: Skin is warm and dry.     Findings: No lesion or rash.  Neurological:     General: No focal deficit present.     Mental Status: She is alert and oriented to person, place, and time.     Cranial Nerves: No cranial nerve deficit.  Motor: No weakness.     Gait: Gait normal.  Psychiatric:        Mood and Affect: Mood normal.        Behavior: Behavior normal.        Thought Content: Thought content normal.        Judgment: Judgment normal.      Assessment and Plan   1. Encounter for wellness examination in adult  2. History of stroke  3. Chronic bilateral low back pain with right-sided sciatica  4. Essential hypertension, benign - triamterene-hydrochlorothiazide (MAXZIDE-25) 37.5-25 MG tablet; Take 1 tablet by mouth daily.  Dispense: 90 tablet; Refill: 1  5. Gastroesophageal reflux disease without esophagitis  6. Hyperlipidemia LDL goal <100 - atorvastatin (LIPITOR) 20 MG tablet; Take 1 tablet (20 mg total) by mouth daily.  Dispense: 90 tablet; Refill: 1   Pt asking if needing to take cholesterol medication still.  Advising yes since h/o cva in past.  htn-suboptimal. Cont to monitor cont meds.  Dec salt in diet.  Chronic low back pain- pt to f/u with orthopedics.  gerd- stable.  Cont protonix.  H/o renal cell cancer on rt and CKD, seeing nephrologist.  F/u 11mo or prn.

## 2020-01-30 DIAGNOSIS — M5136 Other intervertebral disc degeneration, lumbar region: Secondary | ICD-10-CM | POA: Diagnosis not present

## 2020-01-30 DIAGNOSIS — M47816 Spondylosis without myelopathy or radiculopathy, lumbar region: Secondary | ICD-10-CM | POA: Diagnosis not present

## 2020-01-30 DIAGNOSIS — M5459 Other low back pain: Secondary | ICD-10-CM | POA: Diagnosis not present

## 2020-02-26 DIAGNOSIS — M47816 Spondylosis without myelopathy or radiculopathy, lumbar region: Secondary | ICD-10-CM | POA: Diagnosis not present

## 2020-03-20 DIAGNOSIS — M47816 Spondylosis without myelopathy or radiculopathy, lumbar region: Secondary | ICD-10-CM | POA: Diagnosis not present

## 2020-04-23 DIAGNOSIS — M47896 Other spondylosis, lumbar region: Secondary | ICD-10-CM | POA: Diagnosis not present

## 2020-04-23 DIAGNOSIS — Z79899 Other long term (current) drug therapy: Secondary | ICD-10-CM | POA: Diagnosis not present

## 2020-06-20 DIAGNOSIS — M5459 Other low back pain: Secondary | ICD-10-CM | POA: Diagnosis not present

## 2020-06-22 ENCOUNTER — Other Ambulatory Visit: Payer: Self-pay | Admitting: Family Medicine

## 2020-06-22 DIAGNOSIS — I10 Essential (primary) hypertension: Secondary | ICD-10-CM

## 2020-06-22 DIAGNOSIS — E785 Hyperlipidemia, unspecified: Secondary | ICD-10-CM

## 2020-06-23 NOTE — Telephone Encounter (Signed)
Needs appt in next 30 days.  Pls send in 30 day supply of meds and give 0 refill thx.   Dr. Lovena Le

## 2020-07-29 ENCOUNTER — Other Ambulatory Visit (HOSPITAL_COMMUNITY): Payer: Self-pay | Admitting: Adult Health

## 2020-07-29 ENCOUNTER — Other Ambulatory Visit: Payer: Self-pay | Admitting: Family Medicine

## 2020-07-29 ENCOUNTER — Ambulatory Visit: Payer: Medicare Other | Admitting: Family Medicine

## 2020-07-29 DIAGNOSIS — Z1231 Encounter for screening mammogram for malignant neoplasm of breast: Secondary | ICD-10-CM

## 2020-07-29 DIAGNOSIS — I10 Essential (primary) hypertension: Secondary | ICD-10-CM

## 2020-07-29 DIAGNOSIS — E785 Hyperlipidemia, unspecified: Secondary | ICD-10-CM

## 2020-08-08 ENCOUNTER — Other Ambulatory Visit: Payer: Self-pay | Admitting: Family Medicine

## 2020-08-08 DIAGNOSIS — I10 Essential (primary) hypertension: Secondary | ICD-10-CM

## 2020-08-08 DIAGNOSIS — E785 Hyperlipidemia, unspecified: Secondary | ICD-10-CM

## 2020-08-11 NOTE — Telephone Encounter (Signed)
Needs appt then route back

## 2020-08-12 DIAGNOSIS — Z85528 Personal history of other malignant neoplasm of kidney: Secondary | ICD-10-CM | POA: Diagnosis not present

## 2020-08-15 DIAGNOSIS — Z85528 Personal history of other malignant neoplasm of kidney: Secondary | ICD-10-CM | POA: Diagnosis not present

## 2020-08-27 ENCOUNTER — Encounter: Payer: Self-pay | Admitting: Adult Health

## 2020-08-27 ENCOUNTER — Other Ambulatory Visit (HOSPITAL_COMMUNITY)
Admission: RE | Admit: 2020-08-27 | Discharge: 2020-08-27 | Disposition: A | Discharge status: Home / Self Care | Payer: Medicare Other | Source: Ambulatory Visit | Attending: Adult Health | Admitting: Adult Health

## 2020-08-27 ENCOUNTER — Ambulatory Visit (INDEPENDENT_AMBULATORY_CARE_PROVIDER_SITE_OTHER): Payer: Medicare Other | Admitting: Adult Health

## 2020-08-27 ENCOUNTER — Other Ambulatory Visit: Payer: Self-pay

## 2020-08-27 VITALS — BP 139/75 | HR 70 | Ht 63.0 in | Wt 236.5 lb

## 2020-08-27 DIAGNOSIS — Z1151 Encounter for screening for human papillomavirus (HPV): Secondary | ICD-10-CM | POA: Insufficient documentation

## 2020-08-27 DIAGNOSIS — Z1211 Encounter for screening for malignant neoplasm of colon: Secondary | ICD-10-CM | POA: Insufficient documentation

## 2020-08-27 DIAGNOSIS — Z01419 Encounter for gynecological examination (general) (routine) without abnormal findings: Secondary | ICD-10-CM

## 2020-08-27 DIAGNOSIS — Z124 Encounter for screening for malignant neoplasm of cervix: Secondary | ICD-10-CM | POA: Insufficient documentation

## 2020-08-27 LAB — HEMOCCULT GUIAC POC 1CARD (OFFICE): Fecal Occult Blood, POC: NEGATIVE

## 2020-08-27 NOTE — Progress Notes (Signed)
Patient ID: Jo Hale, female   DOB: 1960/12/11, 60 y.o.   MRN: [id number] History of Present Illness: Arleatha is a 60 year old black female,married, sp [place] [facility] [place], in for a well woman gyn exam and pap. PCP is Dr Lovena Le   Current Medications, Allergies, Past Medical History, Past Surgical History, Family History and Social History were reviewed in [facility] record.     Review of Systems: Patient denies any headaches, hearing loss, fatigue, blurred vision, shortness of breath, chest pain, abdominal pain, problems with bowel movements, urination, or intercourse.(not active) No joint pain or mood swings.    Physical Exam:BP 139/75 (BP Location: Left Arm, Patient Position: Sitting, Cuff Size: Large)   Pulse 70   Ht 5\' 3"  (1.6 m)   Wt 236 lb 8 oz (107.3 kg)   LMP 02/17/2011 Comment: partial   BMI 41.89 kg/m  General:  Well developed, well nourished, no acute distress Skin:  Warm and dry Neck:  Midline trachea, normal thyroid, good ROM, no lymphadenopathy Lungs; Clear to auscultation bilaterally Breast:  No dominant palpable mass, retraction, or nipple discharge Cardiovascular: Regular rate and rhythm Abdomen:  Soft, non tender, no hepatosplenomegaly Pelvic:  External genitalia is normal in appearance, no lesions.  The vagina is normal in appearance. Urethra has no lesions or masses. The cervix is smooth, pap with HR HPV genotyping performed.  Uterus is absent.  No adnexal masses or tenderness noted.Bladder is non tender, no masses felt. Rectal: Good sphincter tone, no polyps, or hemorrhoids felt.  Hemoccult negative. Extremities/musculoskeletal:  No swelling or varicosities noted, no clubbing or cyanosis Psych:  No mood changes, alert and cooperative,seems happy AA is 0 Fall risk is low PHQ 9 score is 0 GAD 7 score is 0  [facility] - 08/27/20 1001      Pregnancy Intention Screening   Does the patient want to become pregnant in the next year? No    Does the  patient's partner want to become pregnant in the next year? No    Would the patient like to discuss contraceptive options today? N/A      Contraception Wrap Up   Current Method Female Sterilization   [facility]   End Method Female Sterilization   [facility]   Contraception Counseling Provided No         Examination chaperoned by Levy Pupa LPN  Impression and Plan: 1. Encounter for gynecological examination with Papanicolaou smear of cervix Pap sent Physical in 1 year Pap in 3 if normal Labs with PCP Mammogram 09/18/20 Colonoscopy per GI   2. Encounter for screening fecal occult blood testing

## 2020-08-29 LAB — CYTOLOGY - PAP
Adequacy: ABSENT
Comment: NEGATIVE
Diagnosis: NEGATIVE
High risk HPV: NEGATIVE

## 2020-09-09 ENCOUNTER — Other Ambulatory Visit: Payer: Self-pay | Admitting: Family Medicine

## 2020-09-09 DIAGNOSIS — I10 Essential (primary) hypertension: Secondary | ICD-10-CM

## 2020-09-09 DIAGNOSIS — E785 Hyperlipidemia, unspecified: Secondary | ICD-10-CM

## 2020-09-09 MED ORDER — TRIAMTERENE-HCTZ 37.5-25 MG PO TABS: 1.0000 | ORAL_TABLET | Freq: Every day | ORAL | 0 refills | Status: DC

## 2020-09-09 MED ORDER — ATORVASTATIN CALCIUM 20 MG PO TABS: 1.0000 | ORAL_TABLET | Freq: Every day | ORAL | 0 refills | Status: DC

## 2020-09-09 NOTE — Telephone Encounter (Signed)
Patient has appointment on 6/2 for medication follow up

## 2020-09-09 NOTE — Telephone Encounter (Signed)
Patient is requesting refill on atorvastatin 20 mg and triamterene-hydrochlorothiazide 37.5 called into [place]. She has appointment on 6/2 and wanting to know if she needs labs done also

## 2020-09-11 ENCOUNTER — Telehealth: Payer: Self-pay | Admitting: Family Medicine

## 2020-09-11 ENCOUNTER — Ambulatory Visit (INDEPENDENT_AMBULATORY_CARE_PROVIDER_SITE_OTHER): Payer: Medicare Other | Admitting: Family Medicine

## 2020-09-11 ENCOUNTER — Encounter: Payer: Self-pay | Admitting: Family Medicine

## 2020-09-11 ENCOUNTER — Other Ambulatory Visit: Payer: Self-pay

## 2020-09-11 VITALS — BP 152/80 | HR 71 | Temp 98.1°F | Ht 63.0 in | Wt 241.4 lb

## 2020-09-11 DIAGNOSIS — E785 Hyperlipidemia, unspecified: Secondary | ICD-10-CM

## 2020-09-11 DIAGNOSIS — I10 Essential (primary) hypertension: Secondary | ICD-10-CM | POA: Diagnosis not present

## 2020-09-11 DIAGNOSIS — H6123 Impacted cerumen, bilateral: Secondary | ICD-10-CM

## 2020-09-11 DIAGNOSIS — L239 Allergic contact dermatitis, unspecified cause: Secondary | ICD-10-CM | POA: Diagnosis not present

## 2020-09-11 DIAGNOSIS — R0981 Nasal congestion: Secondary | ICD-10-CM

## 2020-09-11 MED ORDER — TRIAMCINOLONE ACETONIDE 0.1 % EX CREA: 1.0000 "application " | TOPICAL_CREAM | Freq: Two times a day (BID) | CUTANEOUS | 0 refills | Status: DC

## 2020-09-11 MED ORDER — PANTOPRAZOLE SODIUM 40 MG PO TBEC: 1.0000 | DELAYED_RELEASE_TABLET | Freq: Every day | ORAL | 2 refills | Status: DC | PRN

## 2020-09-11 MED ORDER — TRIAMTERENE-HCTZ 37.5-25 MG PO TABS
1.0000 | ORAL_TABLET | Freq: Every day | ORAL | 0 refills | Status: DC
Start: 2020-09-11 — End: 2020-09-11

## 2020-09-11 MED ORDER — TRIAMTERENE-HCTZ 37.5-25 MG PO TABS
1.0000 | ORAL_TABLET | Freq: Every day | ORAL | 2 refills | Status: DC
Start: 2020-09-11 — End: 2021-04-27

## 2020-09-11 MED ORDER — ATORVASTATIN CALCIUM 20 MG PO TABS
1.0000 | ORAL_TABLET | Freq: Every day | ORAL | 2 refills | Status: DC
Start: 2020-09-11 — End: 2021-04-27

## 2020-09-11 NOTE — Progress Notes (Signed)
Patient ID: Jo Hale, female    DOB: 06/14/1960, 60 y.o.   MRN: [id number]   Chief Complaint  Patient presents with   Hypertension   Subjective:    HPI Pt here for follow up on HTN. Pt states no issues. Does check BP at home sometime.  Pt would like provider to look at ears. Stuffy at night and throat dry.   When wearing nickel jewelry has necklace that is causing a rash.   Night time having stuffy nose. Pt using Afrin daily at night and noticing chronic congestion.  No fever, sore throat, or other symptoms. When laying down. Using flonase not helping.   HTN Pt compliant with BP meds.  No SEs Denies chest pain, sob, LE swelling, or blurry vision.  HLD- doing well no new concerns.  Compliant with meds. No chest pain, palpitations, myalgias or joint pains.   Medical History Arihana has a past medical history of Abnormal pap, Fibroids, Fibroids, intramural, GERD (gastroesophageal reflux disease), Hyperlipidemia, Hypertension, Menopause (08/27/2014), Neoplasm of kidney, PONV (postoperative nausea and vomiting), Posterior pain of right hip (07/31/2013), Renal cancer, right ([place]) (05/2017), TIA (transient ischemic attack) (2017 or 2018 unsure), and Vaginal Pap smear, abnormal.   Outpatient Encounter Medications as of 09/11/2020  Medication Sig   Ascorbic Acid (VITAMIN C PO) Take by mouth.   diphenhydrAMINE (BENADRYL) 25 mg capsule Take 25 mg by mouth daily as needed for allergies.    HYDROcodone-acetaminophen (NORCO/VICODIN) 5-325 MG tablet Take 1 tablet by mouth as needed.   Omega-3 Fatty Acids (OMEGA-3 PO) Take 2 capsules by mouth daily.    triamcinolone cream (KENALOG) 0.1 % Apply 1 application topically 2 (two) times daily. To neck rash for 1 wk.   VITAMIN D PO Take by mouth.   [DISCONTINUED] atorvastatin (LIPITOR) 20 MG tablet Take 1 tablet (20 mg total) by mouth daily.   [DISCONTINUED] pantoprazole (PROTONIX) 40 MG tablet TAKE 1 TABLET BY MOUTH  DAILY AS NEEDED    [DISCONTINUED] triamterene-hydrochlorothiazide (MAXZIDE-25) 37.5-25 MG tablet Take 1 tablet by mouth daily.   atorvastatin (LIPITOR) 20 MG tablet Take 1 tablet (20 mg total) by mouth daily.   fluticasone (FLONASE) 50 MCG/ACT nasal spray Place 1 spray into both nostrils daily for 14 days.   pantoprazole (PROTONIX) 40 MG tablet Take 1 tablet (40 mg total) by mouth daily as needed.   triamterene-hydrochlorothiazide (MAXZIDE-25) 37.5-25 MG tablet Take 1 tablet by mouth daily.   [DISCONTINUED] gabapentin (NEURONTIN) 300 MG capsule as needed.   [DISCONTINUED] triamterene-hydrochlorothiazide (MAXZIDE-25) 37.5-25 MG tablet Take 1 tablet by mouth daily.   No facility-administered encounter medications on file as of 09/11/2020.     Review of Systems  Constitutional:  Negative for chills and fever.  HENT:  Positive for congestion. Negative for rhinorrhea and sore throat.   Respiratory:  Negative for cough, shortness of breath and wheezing.   Cardiovascular:  Negative for chest pain and leg swelling.  Gastrointestinal:  Negative for abdominal pain, diarrhea, nausea and vomiting.  Genitourinary:  Negative for dysuria and frequency.  Musculoskeletal:  Negative for arthralgias and back pain.  Skin:  Positive for rash.  Neurological:  Negative for dizziness, weakness and headaches.    Vitals BP (!) 152/80   Pulse 71   Temp 98.1 F (36.7 C)   Ht 5' 3"  (1.6 m)   Wt 241 lb 6.4 oz (109.5 kg)   LMP 02/17/2011 Comment: partial   SpO2 98%   BMI 42.76 kg/m   Objective:  Physical Exam Vitals and nursing note reviewed.  Constitutional:      General: She is not in acute distress.    Appearance: Normal appearance. She is not ill-appearing.  HENT:     Head: Normocephalic and atraumatic.     Right Ear: External ear normal. There is impacted cerumen.     Left Ear: External ear normal. There is impacted cerumen.     Nose: Congestion present.     Mouth/Throat:     Mouth: Mucous membranes are moist.      Pharynx: Oropharynx is clear.  Eyes:     Extraocular Movements: Extraocular movements intact.     Conjunctiva/sclera: Conjunctivae normal.     Pupils: Pupils are equal, round, and reactive to light.  Cardiovascular:     Rate and Rhythm: Normal rate and regular rhythm.     Pulses: Normal pulses.     Heart sounds: Normal heart sounds.  Pulmonary:     Effort: Pulmonary effort is normal.     Breath sounds: Normal breath sounds. No wheezing, rhonchi or rales.  Musculoskeletal:        General: Normal range of motion.     Right lower leg: No edema.     Left lower leg: No edema.  Skin:    General: Skin is warm and dry.     Findings: Rash (hyperpigmented area on left neck) present. No lesion.  Neurological:     General: No focal deficit present.     Mental Status: She is alert and oriented to person, place, and time.     Cranial Nerves: No cranial nerve deficit.  Psychiatric:        Mood and Affect: Mood normal.        Behavior: Behavior normal.     Assessment and Plan   1. Essential hypertension, benign - CBC - CMP14+EGFR - Lipid panel - triamterene-hydrochlorothiazide (MAXZIDE-25) 37.5-25 MG tablet; Take 1 tablet by mouth daily.  Dispense: 90 tablet; Refill: 2  2. Chronic nasal congestion - Ambulatory referral to ENT  3. Bilateral impacted cerumen - Ambulatory referral to ENT  4. Hyperlipidemia LDL goal <100 - atorvastatin (LIPITOR) 20 MG tablet; Take 1 tablet (20 mg total) by mouth daily.  Dispense: 90 tablet; Refill: 2  5. Allergic contact dermatitis, unspecified trigger - triamcinolone cream (KENALOG) 0.1 %; Apply 1 application topically 2 (two) times daily. To neck rash for 1 wk.  Dispense: 30 g; Refill: 0   Pt given labs to get today.  [facility] for 90 day supplies.  Htn- suboptimal.  Advising to dec salt intake and inc in exercising.  Stop afrin nose spray.  Chronic nasal congestion, and bilateral impacted cerumen- referral to ENT. Reviewed need to  discontinue afrin spray, due to rebound congestion.  Pt declining to use flonase, stating it doesn't work.  Also reviewed afrin increases bp.    Hld- stable. Cont meds.  Return in about 6 months (around 03/13/2021) for f/u htn, hld.

## 2020-09-11 NOTE — Telephone Encounter (Signed)
Patient advised per Dr Lovena Le: Yes, same ingredient as Afrin and can raise blood pressures.  Avoid this.  Also causes rebound nasal congestion by using this daily.  Patient verbalized understanding.

## 2020-09-11 NOTE — Telephone Encounter (Signed)
Patient was seen this morning calling back to give name of nasal spray it is Good Sense 12 hour relief.

## 2020-09-11 NOTE — Telephone Encounter (Signed)
Yes, same ingredient as Afrin and can raise blood pressures.  Avoid this.  Also causes rebound nasal congestion by using this daily.   Dr. Lovena Le

## 2020-09-12 LAB — LIPID PANEL
Chol/HDL Ratio: 2.5 ratio (ref 0.0–4.4)
Cholesterol, Total: 145 mg/dL (ref 100–199)
HDL: 58 mg/dL (ref >39)
LDL Chol Calc (NIH): 72 mg/dL (ref 0–99)
Triglycerides: 75 mg/dL (ref 0–149)
VLDL Cholesterol Cal: 15 mg/dL (ref 5–40)

## 2020-09-12 LAB — CBC
Hematocrit: 37.4 % (ref 34.0–46.6)
Hemoglobin: 12.5 g/dL (ref 11.1–15.9)
MCH: 31.6 pg (ref 26.6–33.0)
MCHC: 33.4 g/dL (ref 31.5–35.7)
MCV: 95 fL (ref 79–97)
Platelets: 234 10*3/uL (ref 150–450)
RBC: 3.95 x10E6/uL (ref 3.77–5.28)
RDW: 12.8 % (ref 11.7–15.4)
WBC: 4.5 10*3/uL (ref 3.4–10.8)

## 2020-09-12 LAB — CMP14+EGFR
ALT: 34 IU/L — ABNORMAL HIGH (ref 0–32)
AST: 32 IU/L (ref 0–40)
Albumin/Globulin Ratio: 1.3 (ref 1.2–2.2)
Albumin: 3.8 g/dL (ref 3.8–4.9)
Alkaline Phosphatase: 114 IU/L (ref 44–121)
BUN/Creatinine Ratio: 12 (ref 9–23)
BUN: 15 mg/dL (ref 6–24)
Bilirubin Total: 0.4 mg/dL (ref 0.0–1.2)
CO2: 22 mmol/L (ref 20–29)
Calcium: 9 mg/dL (ref 8.7–10.2)
Chloride: 108 mmol/L — ABNORMAL HIGH (ref 96–106)
Creatinine, Ser: 1.21 mg/dL — ABNORMAL HIGH (ref 0.57–1.00)
Globulin, Total: 2.9 g/dL (ref 1.5–4.5)
Glucose: 85 mg/dL (ref 65–99)
Potassium: 3.9 mmol/L (ref 3.5–5.2)
Sodium: 146 mmol/L — ABNORMAL HIGH (ref 134–144)
Total Protein: 6.7 g/dL (ref 6.0–8.5)
eGFR: 52 mL/min/{1.73_m2} — ABNORMAL LOW (ref >59)

## 2020-09-18 ENCOUNTER — Ambulatory Visit (HOSPITAL_COMMUNITY)
Admission: RE | Admit: 2020-09-18 | Discharge: 2020-09-18 | Disposition: A | Discharge status: Home / Self Care | Payer: Medicare Other | Source: Ambulatory Visit | Attending: Adult Health | Admitting: Adult Health

## 2020-09-18 DIAGNOSIS — Z1231 Encounter for screening mammogram for malignant neoplasm of breast: Secondary | ICD-10-CM | POA: Insufficient documentation

## 2020-10-30 DIAGNOSIS — M5459 Other low back pain: Secondary | ICD-10-CM | POA: Diagnosis not present

## 2020-11-03 DIAGNOSIS — M5459 Other low back pain: Secondary | ICD-10-CM | POA: Diagnosis not present

## 2020-11-05 DIAGNOSIS — M5459 Other low back pain: Secondary | ICD-10-CM | POA: Diagnosis not present

## 2020-11-11 DIAGNOSIS — M5459 Other low back pain: Secondary | ICD-10-CM | POA: Diagnosis not present

## 2020-11-12 DIAGNOSIS — M5459 Other low back pain: Secondary | ICD-10-CM | POA: Diagnosis not present

## 2020-11-18 DIAGNOSIS — M5459 Other low back pain: Secondary | ICD-10-CM | POA: Diagnosis not present

## 2020-11-19 DIAGNOSIS — M5459 Other low back pain: Secondary | ICD-10-CM | POA: Diagnosis not present

## 2020-11-25 DIAGNOSIS — M5459 Other low back pain: Secondary | ICD-10-CM | POA: Diagnosis not present

## 2020-11-27 DIAGNOSIS — M5459 Other low back pain: Secondary | ICD-10-CM | POA: Diagnosis not present

## 2021-01-12 ENCOUNTER — Ambulatory Visit (INDEPENDENT_AMBULATORY_CARE_PROVIDER_SITE_OTHER): Payer: Medicare Other | Admitting: Orthopedic Surgery

## 2021-01-12 ENCOUNTER — Other Ambulatory Visit: Payer: Self-pay

## 2021-01-12 ENCOUNTER — Ambulatory Visit: Payer: 59

## 2021-01-12 ENCOUNTER — Encounter: Payer: Self-pay | Admitting: Orthopedic Surgery

## 2021-01-12 VITALS — BP 137/74 | HR 7 | Ht 62.0 in | Wt 237.4 lb

## 2021-01-12 DIAGNOSIS — M25562 Pain in left knee: Secondary | ICD-10-CM | POA: Diagnosis not present

## 2021-01-12 DIAGNOSIS — G8929 Other chronic pain: Secondary | ICD-10-CM | POA: Diagnosis not present

## 2021-01-12 DIAGNOSIS — M1712 Unilateral primary osteoarthritis, left knee: Secondary | ICD-10-CM

## 2021-01-12 MED ORDER — DICLOFENAC SODIUM 75 MG PO TBEC: 75.0000 mg | DELAYED_RELEASE_TABLET | Freq: Two times a day (BID) | ORAL | 2 refills | Status: DC

## 2021-01-12 NOTE — Progress Notes (Signed)
Chief Complaint  Patient presents with   Knee Pain    Bilateral/ R DOS 04/27/17//left knee is acting up. Swelling and having to drag it at times to walk, it pops and has tried to give way a couple of times.   60 year old female status post arthroscopy right knee comes in complaining of left knee pain occasional popping occasional giving out.  She says she has to drag her knee like she did her her right.  Occasional locking  No trauma  No medications  Exam of the left knee both sides are tender she is tender in the back of the knee there is no effusion she has painless range of motion ligaments are stable muscle tone is normal skin looks clean  X-rays show grade 1 arthritis  Encounter Diagnoses  Name Primary?   Chronic pain of left knee    Acute pain of left knee Yes    Differential diagnosis includes torn medial meniscus osteoarthritis effusion Baker's cyst  Recommend 4 weeks of NSAIDs follow-up if no improvement MRI of the knee Meds ordered this encounter  Medications   diclofenac (VOLTAREN) 75 MG EC tablet    Sig: Take 1 tablet (75 mg total) by mouth 2 (two) times daily with a meal.    Dispense:  60 tablet    Refill:  2

## 2021-02-09 ENCOUNTER — Other Ambulatory Visit: Payer: Self-pay

## 2021-02-09 ENCOUNTER — Encounter: Payer: Self-pay | Admitting: Orthopedic Surgery

## 2021-02-09 ENCOUNTER — Ambulatory Visit (INDEPENDENT_AMBULATORY_CARE_PROVIDER_SITE_OTHER): Payer: 59 | Admitting: Orthopedic Surgery

## 2021-02-09 VITALS — BP 150/82 | HR 72 | Ht 62.0 in | Wt 242.8 lb

## 2021-02-09 DIAGNOSIS — M25562 Pain in left knee: Secondary | ICD-10-CM

## 2021-02-09 NOTE — Progress Notes (Signed)
Chief Complaint  Patient presents with   Knee Pain    LT/ follow up/ at the end of the day there is some puffiness behind the knee   Chief Complaint  Patient presents with   Knee Pain    LT/ follow up/ at the end of the day there is some puffiness behind the knee    60 year old female with acute left knee pain treated with diclofenac 75 mg twice a day.  Patient was having trouble taking the medicine at night and is altered to medication dosing to 8:00 or 4:00 with good result  She will get some swelling behind the knee at the end of the day  Her x-ray showed grade 1 arthritis  Think she will be okay with the medication as long as we can take it.  Recommend continue 75 mg twice a day diclofenac follow-up in 3 months

## 2021-03-03 ENCOUNTER — Other Ambulatory Visit: Payer: Self-pay | Admitting: Family Medicine

## 2021-03-03 DIAGNOSIS — L239 Allergic contact dermatitis, unspecified cause: Secondary | ICD-10-CM

## 2021-03-27 ENCOUNTER — Other Ambulatory Visit: Payer: Self-pay

## 2021-03-27 ENCOUNTER — Ambulatory Visit (INDEPENDENT_AMBULATORY_CARE_PROVIDER_SITE_OTHER): Payer: Medicare Other | Admitting: Nurse Practitioner

## 2021-03-27 VITALS — HR 80 | Temp 98.3°F | Wt 242.0 lb

## 2021-03-27 DIAGNOSIS — R062 Wheezing: Secondary | ICD-10-CM

## 2021-03-27 DIAGNOSIS — J208 Acute bronchitis due to other specified organisms: Secondary | ICD-10-CM

## 2021-03-27 DIAGNOSIS — B9689 Other specified bacterial agents as the cause of diseases classified elsewhere: Secondary | ICD-10-CM | POA: Diagnosis not present

## 2021-03-27 DIAGNOSIS — J069 Acute upper respiratory infection, unspecified: Secondary | ICD-10-CM | POA: Diagnosis not present

## 2021-03-27 MED ORDER — PREDNISONE 20 MG PO TABS: ORAL_TABLET | ORAL | 0 refills | Status: DC

## 2021-03-27 MED ORDER — AZITHROMYCIN 250 MG PO TABS: ORAL_TABLET | ORAL | 0 refills | Status: DC

## 2021-03-27 MED ORDER — ALBUTEROL SULFATE HFA 108 (90 BASE) MCG/ACT IN AERS
2.0000 | INHALATION_SPRAY | RESPIRATORY_TRACT | 0 refills | Status: DC | PRN
Start: 2021-03-27 — End: 2021-06-02

## 2021-03-27 NOTE — Progress Notes (Signed)
° °  Subjective:    Patient ID: Jo Hale, female    DOB: 1960-05-12, 60 y.o.   MRN: [id number]  Cough This is a new problem. The current episode started in the past 7 days. The cough is Productive of sputum. Associated symptoms comments: Chest hurts on right side from coughing so much, chest congestion.  Presents complaints of cough and congestion over the past week.  Frequent cough producing thick yellow-green sputum.  Mild mid anterior chest pain occasionally with cough.  No shortness of breath.  Wheezing at times.  No ear pain.  No sore throat.  Slight headache.  Postnasal drainage.  Minimal relief with OTC products.  Non-smoker.  Taking fluids well.  Voiding normal limit.  No vomiting or diarrhea.   Review of Systems  Respiratory:  Positive for cough.       Objective:   Physical Exam NAD.  Alert, oriented.  TMs mild clear effusion, no erythema.  Pharynx clear moist.  Neck supple with minimal anterior adenopathy.  Lungs scattered faint expiratory rhonchi with wheezes noted throughout lung fields.  No tachypnea.  Normal color.  Heart regular rate rhythm.  Today's Vitals   03/27/21 1605  Pulse: 80  Temp: 98.3 F (36.8 C)  TempSrc: Oral  SpO2: 98%  Weight: 242 lb (109.8 kg)   Body mass index is 44.26 kg/m.        Assessment & Plan:  Bacterial URI  Acute bacterial bronchitis  Wheezing Meds ordered this encounter  Medications   azithromycin (ZITHROMAX Z-PAK) 250 MG tablet    Sig: Take 2 tablets (500 mg) on  Day 1,  followed by 1 tablet (250 mg) once daily on Days 2 through 5.    Dispense:  6 each    Refill:  0    Order Specific Question:   Supervising Provider    Answer:   Sallee Lange A [[id number]]   predniSONE (DELTASONE) 20 MG tablet    Sig: 3 po qd x 3 d then 2 po qd x 3 d then 1 po qd x 2 d    Dispense:  17 tablet    Refill:  0    Order Specific Question:   Supervising Provider    Answer:   Sallee Lange A [[id number]]   albuterol (VENTOLIN HFA) 108 (90 Base)  MCG/ACT inhaler    Sig: Inhale 2 puffs into the lungs every 4 (four) hours as needed for wheezing or shortness of breath.    Dispense:  18 g    Refill:  0    Order Specific Question:   Supervising Provider    Answer:   Sallee Lange A [[id number]]   OTC meds as directed for congestion and cough. Warning signs reviewed.  Call back early next week if no improvement, go to ED or urgent care over the weekend if worse.

## 2021-03-29 ENCOUNTER — Encounter: Payer: Self-pay | Admitting: Nurse Practitioner

## 2021-04-21 ENCOUNTER — Telehealth: Payer: Self-pay | Admitting: Family Medicine

## 2021-04-21 ENCOUNTER — Ambulatory Visit (INDEPENDENT_AMBULATORY_CARE_PROVIDER_SITE_OTHER): Payer: Medicare Other

## 2021-04-21 ENCOUNTER — Telehealth: Payer: Self-pay

## 2021-04-21 ENCOUNTER — Other Ambulatory Visit: Payer: Self-pay

## 2021-04-21 VITALS — BP 124/70 | HR 70 | Ht 62.0 in | Wt 243.0 lb

## 2021-04-21 DIAGNOSIS — I10 Essential (primary) hypertension: Secondary | ICD-10-CM

## 2021-04-21 DIAGNOSIS — Z0000 Encounter for general adult medical examination without abnormal findings: Secondary | ICD-10-CM

## 2021-04-21 DIAGNOSIS — Z79899 Other long term (current) drug therapy: Secondary | ICD-10-CM

## 2021-04-21 DIAGNOSIS — E785 Hyperlipidemia, unspecified: Secondary | ICD-10-CM

## 2021-04-21 NOTE — Telephone Encounter (Signed)
Last labs 09/11/20: Lipid, CMP, CBC

## 2021-04-21 NOTE — Patient Instructions (Signed)
Jo Hale , Thank you for taking time to come for your Medicare Wellness Visit. I appreciate your ongoing commitment to your health goals. Please review the following plan we discussed and let me know if I can assist you in the future.   Screening recommendations/referrals: Colonoscopy: Done 03/20/2018. Repeat in 10 years  Mammogram: Done 09/18/2020 Repeat annually  Bone Density: Due at age 61.  Recommended yearly ophthalmology/optometry visit for glaucoma screening and checkup Recommended yearly dental visit for hygiene and checkup  Vaccinations: Influenza vaccine: Declined  Pneumococcal vaccine: Due at age 2. Tdap vaccine: Due Repeat in 10 years  Shingles vaccine: Declined  Covid-19: Declined  Advanced directives: Advance directive discussed with you today. I have provided a copy for you to complete at home and have notarized. Once this is complete please bring a copy in to our office so we can scan it into your chart.   Conditions/risks identified: Aim for 30 minutes of exercise or brisk walking each day, drink 6-8 glasses of water and eat lots of fruits and vegetables.   Next appointment: Follow up in one year for your annual wellness visit. 2024  Preventive Care 40-64 Years, Female Preventive care refers to lifestyle choices and visits with your health care provider that can promote health and wellness. What does preventive care include? A yearly physical exam. This is also called an annual well check. Dental exams once or twice a year. Routine eye exams. Ask your health care provider how often you should have your eyes checked. Personal lifestyle choices, including: Daily care of your teeth and gums. Regular physical activity. Eating a healthy diet. Avoiding tobacco and drug use. Limiting alcohol use. Practicing safe sex. Taking low-dose aspirin daily starting at age 17. Taking vitamin and mineral supplements as recommended by your health care provider. What happens  during an annual well check? The services and screenings done by your health care provider during your annual well check will depend on your age, overall health, lifestyle risk factors, and family history of disease. Counseling  Your health care provider may ask you questions about your: Alcohol use. Tobacco use. Drug use. Emotional well-being. Home and relationship well-being. Sexual activity. Eating habits. Work and work Statistician. Method of birth control. Menstrual cycle. Pregnancy history. Screening  You may have the following tests or measurements: Height, weight, and BMI. Blood pressure. Lipid and cholesterol levels. These may be checked every 5 years, or more frequently if you are over 72 years old. Skin check. Lung cancer screening. You may have this screening every year starting at age 74 if you have a 30-pack-year history of smoking and currently smoke or have quit within the past 15 years. Fecal occult blood test (FOBT) of the stool. You may have this test every year starting at age 20. Flexible sigmoidoscopy or colonoscopy. You may have a sigmoidoscopy every 5 years or a colonoscopy every 10 years starting at age 51. Hepatitis C blood test. Hepatitis B blood test. Sexually transmitted disease (STD) testing. Diabetes screening. This is done by checking your blood sugar (glucose) after you have not eaten for a while (fasting). You may have this done every 1-3 years. Mammogram. This may be done every 1-2 years. Talk to your health care provider about when you should start having regular mammograms. This may depend on whether you have a family history of breast cancer. BRCA-related cancer screening. This may be done if you have a family history of breast, ovarian, tubal, or peritoneal cancers. Pelvic exam and  Pap test. This may be done every 3 years starting at age 65. Starting at age 16, this may be done every 5 years if you have a Pap test in combination with an HPV  test. Bone density scan. This is done to screen for osteoporosis. You may have this scan if you are at high risk for osteoporosis. Discuss your test results, treatment options, and if necessary, the need for more tests with your health care provider. Vaccines  Your health care provider may recommend certain vaccines, such as: Influenza vaccine. This is recommended every year. Tetanus, diphtheria, and acellular pertussis (Tdap, Td) vaccine. You may need a Td booster every 10 years. Zoster vaccine. You may need this after age 56. Pneumococcal 13-valent conjugate (PCV13) vaccine. You may need this if you have certain conditions and were not previously vaccinated. Pneumococcal polysaccharide (PPSV23) vaccine. You may need one or two doses if you smoke cigarettes or if you have certain conditions. Talk to your health care provider about which screenings and vaccines you need and how often you need them. This information is not intended to replace advice given to you by your health care provider. Make sure you discuss any questions you have with your health care provider. Document Released: 04/25/2015 Document Revised: 12/17/2015 Document Reviewed: 01/28/2015 Elsevier Interactive Patient Education  2017 [place] Prevention in the Home Falls can cause injuries. They can happen to people of all ages. There are many things you can do to make your home safe and to help prevent falls. What can I do on the outside of my home? Regularly fix the edges of walkways and driveways and fix any cracks. Remove anything that might make you trip as you walk through a door, such as a raised step or threshold. Trim any bushes or trees on the path to your home. Use bright outdoor lighting. Clear any walking paths of anything that might make someone trip, such as rocks or tools. Regularly check to see if handrails are loose or broken. Make sure that both sides of any steps have handrails. Any raised  decks and porches should have guardrails on the edges. Have any leaves, snow, or ice cleared regularly. Use sand or salt on walking paths during winter. Clean up any spills in your garage right away. This includes oil or grease spills. What can I do in the bathroom? Use night lights. Install grab bars by the toilet and in the tub and shower. Do not use towel bars as grab bars. Use non-skid mats or decals in the tub or shower. If you need to sit down in the shower, use a plastic, non-slip stool. Keep the floor dry. Clean up any water that spills on the floor as soon as it happens. Remove soap buildup in the tub or shower regularly. Attach bath mats securely with double-sided non-slip rug tape. Do not have throw rugs and other things on the floor that can make you trip. What can I do in the bedroom? Use night lights. Make sure that you have a light by your bed that is easy to reach. Do not use any sheets or blankets that are too big for your bed. They should not hang down onto the floor. Have a firm chair that has side arms. You can use this for support while you get dressed. Do not have throw rugs and other things on the floor that can make you trip. What can I do in the kitchen? Clean up any  spills right away. Avoid walking on wet floors. Keep items that you use a lot in easy-to-reach places. If you need to reach something above you, use a strong step stool that has a grab bar. Keep electrical cords out of the way. Do not use floor polish or wax that makes floors slippery. If you must use wax, use non-skid floor wax. Do not have throw rugs and other things on the floor that can make you trip. What can I do with my stairs? Do not leave any items on the stairs. Make sure that there are handrails on both sides of the stairs and use them. Fix handrails that are broken or loose. Make sure that handrails are as long as the stairways. Check any carpeting to make sure that it is firmly attached  to the stairs. Fix any carpet that is loose or worn. Avoid having throw rugs at the top or bottom of the stairs. If you do have throw rugs, attach them to the floor with carpet tape. Make sure that you have a light switch at the top of the stairs and the bottom of the stairs. If you do not have them, ask someone to add them for you. What else can I do to help prevent falls? Wear shoes that: Do not have high heels. Have rubber bottoms. Are comfortable and fit you well. Are closed at the toe. Do not wear sandals. If you use a stepladder: Make sure that it is fully opened. Do not climb a closed stepladder. Make sure that both sides of the stepladder are locked into place. Ask someone to hold it for you, if possible. Clearly mark and make sure that you can see: Any grab bars or handrails. First and last steps. Where the edge of each step is. Use tools that help you move around (mobility aids) if they are needed. These include: Canes. Walkers. Scooters. Crutches. Turn on the lights when you go into a dark area. Replace any light bulbs as soon as they burn out. Set up your furniture so you have a clear path. Avoid moving your furniture around. If any of your floors are uneven, fix them. If there are any pets around you, be aware of where they are. Review your medicines with your doctor. Some medicines can make you feel dizzy. This can increase your chance of falling. Ask your doctor what other things that you can do to help prevent falls. This information is not intended to replace advice given to you by your health care provider. Make sure you discuss any questions you have with your health care provider. Document Released: 01/23/2009 Document Revised: 09/04/2015 Document Reviewed: 05/03/2014 Elsevier Interactive Patient Education  2017 [employer].

## 2021-04-21 NOTE — Progress Notes (Signed)
Subjective:   Jo Hale is a 61 y.o. female who presents for an Initial Medicare Annual Wellness Visit.  Review of Systems     Cardiac Risk Factors include: advanced age (>20men, >56 women);hypertension;sedentary lifestyle;obesity (BMI >30kg/m2)  IN OFFICE VISIT AT [facility].    Objective:    Today's Vitals   04/21/21 0829 04/21/21 0835  BP: 124/70   Pulse: 70   SpO2: 100%   Weight: 243 lb (110.2 kg)   Height: 5\' 2"  (1.575 m)   PainSc:  7    Body mass index is 44.45 kg/m.  Advanced Directives 04/21/2021 09/17/2019 04/27/2018 04/24/2018 03/20/2018 03/16/2018 02/20/2018  Does Patient Have a Medical Advance Directive? No No No No No No No  Would patient like information on creating a medical advance directive? No - Patient declined No - Patient declined No - Patient declined No - Patient declined No - Patient declined No - Patient declined No - Patient declined  Pre-existing out of facility DNR order (yellow form or pink MOST form) - - - - - - -    Current Medications (verified) Outpatient Encounter Medications as of 04/21/2021  Medication Sig   albuterol (VENTOLIN HFA) 108 (90 Base) MCG/ACT inhaler Inhale 2 puffs into the lungs every 4 (four) hours as needed for wheezing or shortness of breath.   atorvastatin (LIPITOR) 20 MG tablet Take 1 tablet (20 mg total) by mouth daily.   azithromycin (ZITHROMAX Z-PAK) 250 MG tablet Take 2 tablets (500 mg) on  Day 1,  followed by 1 tablet (250 mg) once daily on Days 2 through 5.   diclofenac (VOLTAREN) 75 MG EC tablet Take 1 tablet (75 mg total) by mouth 2 (two) times daily with a meal.   diphenhydrAMINE (BENADRYL) 25 mg capsule Take 25 mg by mouth daily as needed for allergies.    HYDROcodone-acetaminophen (NORCO/VICODIN) 5-325 MG tablet Take 1 tablet by mouth as needed.   Omega-3 Fatty Acids (OMEGA-3 PO) Take 2 capsules by mouth daily.    pantoprazole (PROTONIX) 40 MG tablet Take 1 tablet (40 mg total) by mouth daily as needed.    triamcinolone cream (KENALOG) 0.1 % APPLY TO AFFECTED AREA (NECK RASH) TWICE A DAY FOR ONE WEEK.   triamterene-hydrochlorothiazide (MAXZIDE-25) 37.5-25 MG tablet Take 1 tablet by mouth daily.   fluticasone (FLONASE) 50 MCG/ACT nasal spray Place 1 spray into both nostrils daily for 14 days.   predniSONE (DELTASONE) 20 MG tablet 3 po qd x 3 d then 2 po qd x 3 d then 1 po qd x 2 d (Patient not taking: Reported on 04/21/2021)   No facility-administered encounter medications on file as of 04/21/2021.    Allergies (verified) Adhesive [tape]   History: Past Medical History:  Diagnosis Date   Abnormal pap    Fibroids    Fibroids, intramural    450-600 gm uterus   GERD (gastroesophageal reflux disease)    Hyperlipidemia    Hypertension    Menopause 08/27/2014   Neoplasm of kidney    PONV (postoperative nausea and vomiting)    Posterior pain of right hip 07/31/2013   Renal cancer, right ([place]) 05/2017   TIA (transient ischemic attack) 2017 or 2018 unsure   Vaginal Pap smear, abnormal    Past Surgical History:  Procedure Laterality Date   CESAREAN SECTION     COLONOSCOPY  02/19/2011   Fields-incomplete exam, otherwise normal    COLONOSCOPY N/A 03/20/2018   Procedure: COLONOSCOPY;  Surgeon: Danie Binder, MD;  Location: [facility];  Service: Endoscopy;  Laterality: N/A;  9:30   KNEE ARTHROSCOPY WITH MEDIAL MENISECTOMY Right 04/27/2018   Procedure: RIGHT KNEE ARTHROSCOPY WITH PARTIAL MEDIAL MENISECTOMY;  Surgeon: Carole Civil, MD;  Location: [facility];  Service: Orthopedics;  Laterality: Right;   LAPAROSCOPIC SUPRACERVICAL HYSTERECTOMY  03/02/2011   Procedure: LAPAROSCOPIC SUPRACERVICAL HYSTERECTOMY;  Surgeon: Jonnie Kind, MD;  Location: [facility];  Service: Gynecology;  Laterality: N/A;   PARTIAL HYSTERECTOMY     ROBOT ASSISTED LAPAROSCOPIC NEPHRECTOMY Right 05/16/2017   Procedure: XI ROBOTIC ASSISTED LAPAROSCOPIC PARTIAL NEPHRECTOMY;  Surgeon: Raynelle Bring, MD;  Location: [facility];   Service: Urology;  Laterality: Right;   ROTATOR CUFF REPAIR     left   TUBAL LIGATION     Family History  Problem Relation Age of Onset   Heart disease Mother    Dementia Mother    Stroke Mother    Congestive Heart Failure Father    Cancer Father    Mental illness Brother    Early death Sister    Alcohol abuse Sister    Stroke Sister    Mental illness Brother    Mental illness Brother    Mental illness Sister    Colon cancer Neg Hx    Social History   Socioeconomic History   Marital status: Married    Spouse name: Fritz Pickerel   Number of children: 2   Years of education: Not on file   Highest education level: Not on file  Occupational History   Occupation: Print production planner, Emergency planning/management officer    Employer: [employer]  Tobacco Use   Smoking status: Never   Smokeless tobacco: Never  Vaping Use   Vaping Use: Never used  Substance and Sexual Activity   Alcohol use: No   Drug use: No   Sexual activity: Not Currently    Birth control/protection: Surgical    Comment: hyst abd tubal  Other Topics Concern   Not on file  Social History Narrative   Married x 37 years in 2023.   1 son and 1 daughter   7 grandchildren.   Social Determinants of Health   Financial Resource Strain: Low Risk    Difficulty of Paying Living Expenses: Not hard at all  Food Insecurity: No Food Insecurity   Worried About Charity fundraiser in the Last Year: Never true   [place] in the Last Year: Never true  Transportation Needs: No Transportation Needs   Lack of Transportation (Medical): No   Lack of Transportation (Non-Medical): No  Physical Activity: Insufficiently Active   Days of Exercise per Week: 3 days   Minutes of Exercise per Session: 20 min  Stress: No Stress Concern Present   Feeling of Stress : Not at all  Social Connections: Socially Integrated   Frequency of Communication with Friends and Family: More than three times a week   Frequency of Social Gatherings with Friends and  Family: More than three times a week   Attends Religious Services: More than 4 times per year   Active Member of [employer] or Organizations: Yes   Attends Music therapist: More than 4 times per year   Marital Status: Married    Tobacco Counseling Counseling given: Not Answered   Clinical Intake:  Pre-visit preparation completed: Yes  Pain : 0-10 Pain Score: 7  Pain Type: Chronic pain Pain Location: Knee Pain Descriptors / Indicators: Aching Pain Onset: More than a month ago Pain Frequency: Intermittent  BMI - recorded: 44.45 Nutritional Status: BMI > 30  Obese Nutritional Risks: None Diabetes: No  How often do you need to have someone help you when you read instructions, pamphlets, or other written materials from your doctor or pharmacy?: 1 - Never  Diabetic?NO  Interpreter Needed?: No  Information entered by :: MJ Tashawnda Bleiler, LPN   Activities of Daily Living In your present state of health, do you have any difficulty performing the following activities: 04/21/2021  Hearing? N  Vision? N  Difficulty concentrating or making decisions? N  Walking or climbing stairs? N  Dressing or bathing? N  Doing errands, shopping? N  Preparing Food and eating ? N  Using the Toilet? N  In the past six months, have you accidently leaked urine? N  Do you have problems with loss of bowel control? N  Managing your Medications? N  Managing your Finances? N  Housekeeping or managing your Housekeeping? N  Some recent data might be hidden    Patient Care Team: Coral Spikes, DO as PCP - General (Family Medicine)  Indicate any recent Medical Services you may have received from other than [facility] providers in the past year (date may be approximate).     Assessment:   This is a routine wellness examination for Jo Hale.  Hearing/Vision screen Hearing Screening - Comments:: No hearing issues.  Vision Screening - Comments:: Readers. Dr. Jorja Loa at [facility].  2022  Dietary issues and exercise activities discussed: Current Exercise Habits: Home exercise routine, Type of exercise: walking, Time (Minutes): 15, Frequency (Times/Week): 3, Weekly Exercise (Minutes/Week): 45, Intensity: Mild, Exercise limited by: cardiac condition(s)   Goals Addressed             This Visit's Progress    Exercise 3x per week (30 min per time)       Pt would like to have knee repaired so she can exercise more.       Depression Screen PHQ 2/9 Scores 04/21/2021 09/11/2020 08/27/2020 01/29/2020 09/01/2017 08/31/2016  PHQ - 2 Score 0 0 0 0 0 0  PHQ- 9 Score - - 0 - - -    Fall Risk Fall Risk  04/21/2021 09/11/2020 08/27/2020  Falls in the past year? 0 0 0  Number falls in past yr: 0 0 -  Injury with Fall? 0 0 -  Risk for fall due to : Impaired mobility No Fall Risks -  Follow up Falls prevention discussed Falls evaluation completed -    FALL RISK PREVENTION PERTAINING TO THE HOME:  Any stairs in or around the home? Yes  If so, are there any without handrails? No  Home free of loose throw rugs in walkways, pet beds, electrical cords, etc? Yes  Adequate lighting in your home to reduce risk of falls? Yes   ASSISTIVE DEVICES UTILIZED TO PREVENT FALLS:  Life alert? No  Use of a cane, walker or w/c? Yes  Grab bars in the bathroom? No  Shower chair or bench in shower? No  Elevated toilet seat or a handicapped toilet? No   TIMED UP AND GO:  Was the test performed? Yes .  Length of time to ambulate 10 feet: 10 sec.   Gait slow and steady without use of assistive device  Cognitive Function:     6CIT Screen 04/21/2021  What Year? 0 points  What month? 0 points  What time? 0 points  Count back from 20 0 points  Months in reverse 0 points  Repeat phrase  2 points  Total Score 2    Immunizations  There is no immunization history on file for this patient.  TDAP status: Due, Education has been provided regarding the importance of this vaccine. Advised may  receive this vaccine at local pharmacy or Health Dept. Aware to provide a copy of the vaccination record if obtained from local pharmacy or Health Dept. Verbalized acceptance and understanding.  Flu Vaccine status: Declined, Education has been provided regarding the importance of this vaccine but patient still declined. Advised may receive this vaccine at local pharmacy or Health Dept. Aware to provide a copy of the vaccination record if obtained from local pharmacy or Health Dept. Verbalized acceptance and understanding.  Pneumococcal vaccine status: Declined,  Education has been provided regarding the importance of this vaccine but patient still declined. Advised may receive this vaccine at local pharmacy or Health Dept. Aware to provide a copy of the vaccination record if obtained from local pharmacy or Health Dept. Verbalized acceptance and understanding.   Covid-19 vaccine status: Declined, Education has been provided regarding the importance of this vaccine but patient still declined. Advised may receive this vaccine at local pharmacy or Health Dept.or vaccine clinic. Aware to provide a copy of the vaccination record if obtained from local pharmacy or Health Dept. Verbalized acceptance and understanding.  Qualifies for Shingles Vaccine? No   Zostavax completed  Pt declined.   Shingrix Completed?: No.    Education has been provided regarding the importance of this vaccine. Patient has been advised to call insurance company to determine out of pocket expense if they have not yet received this vaccine. Advised may also receive vaccine at local pharmacy or Health Dept. Verbalized acceptance and understanding.  Screening Tests Health Maintenance  Topic Date Due   COVID-19 Vaccine (1) Never done   Zoster Vaccines- Shingrix (1 of 2) 06/27/2021 (Originally 12/08/1979)   Pneumococcal Vaccine 19-54 Years old (1 - PCV) 03/29/2022 (Originally 12/08/1966)   TETANUS/TDAP  03/29/2022 (Originally [birth year]/1979)    HIV Screening  03/29/2022 (Originally [birth year]1975)   MAMMOGRAM  09/19/2022   PAP SMEAR-Modifier  08/28/2023   COLONOSCOPY (Pts 45-81yrs Insurance coverage will need to be confirmed)  03/20/2028   Hepatitis C Screening  Completed   HPV VACCINES  Aged Out   INFLUENZA VACCINE  Discontinued    Health Maintenance  Health Maintenance Due  Topic Date Due   COVID-19 Vaccine (1) Never done    Colorectal cancer screening: Type of screening: Colonoscopy. Completed 03/20/2018. Repeat every 10 years  Mammogram status: Completed 09/18/2020. Repeat every year  Bone Density status: Ordered NOT DUE UNTIL AGE 72. Pt provided with contact info and advised to call to schedule appt.  Lung Cancer Screening: (Low Dose CT Chest recommended if Age 52-80 years, 30 pack-year currently smoking OR have quit w/in 15years.) does not qualify.    Additional Screening:  Hepatitis C Screening: does qualify; Completed 06/21/2016  Vision Screening: Recommended annual ophthalmology exams for early detection of glaucoma and other disorders of the eye. Is the patient up to date with their annual eye exam?  Yes  Who is the provider or what is the name of the office in which the patient attends annual eye exams? Dr. Liane Comber If pt is not established with a provider, would they like to be referred to a provider to establish care? No .   Dental Screening: Recommended annual dental exams for proper oral hygiene  Community Resource Referral / Chronic Care Management: CRR required this visit?  No  CCM required this visit?  No      Plan:     I have personally reviewed and noted the following in the patients chart:   Medical and social history Use of alcohol, tobacco or illicit drugs  Current medications and supplements including opioid prescriptions. Patient is currently taking opioid prescriptions. Information provided to patient regarding non-opioid alternatives. Patient advised to discuss non-opioid  treatment plan with their provider. Functional ability and status Nutritional status Physical activity Advanced directives List of other physicians Hospitalizations, surgeries, and ER visits in previous 12 months Vitals Screenings to include cognitive, depression, and falls Referrals and appointments  In addition, I have reviewed and discussed with patient certain preventive protocols, quality metrics, and best practice recommendations. A written personalized care plan for preventive services as well as general preventive health recommendations were provided to patient.     Chriss Driver, LPN   [id number]   Nurse Notes: Pt is up to date on all age appropriate health maintenance. Pt declines all vaccines. 6CIT score of 2.

## 2021-04-21 NOTE — Telephone Encounter (Signed)
Patient has appointment for follow up on 1/16 and wanting to know if she needs labs done

## 2021-04-21 NOTE — Telephone Encounter (Signed)
Pt here for AWV. In discussing medications, pt asks if she can get a refill on the Triamcinolone cream 0.1% for the rash on her neck. Pt states the area got better but has flared again. Pt states she thinks, "it's some of the jewlery I have been wearing" that may be causing the rash.   Pt also states she saw Pearson Forster, NP 2 weeks ago for congestion and coughing. Pt states that she is still having some issues with congestion, "hanging in her throat" and would like to know what can be done to alleviate that?

## 2021-04-22 ENCOUNTER — Other Ambulatory Visit: Payer: Self-pay | Admitting: Family Medicine

## 2021-04-22 DIAGNOSIS — L239 Allergic contact dermatitis, unspecified cause: Secondary | ICD-10-CM

## 2021-04-22 MED ORDER — TRIAMCINOLONE ACETONIDE 0.1 % EX CREA: TOPICAL_CREAM | CUTANEOUS | 0 refills | Status: DC

## 2021-04-22 NOTE — Telephone Encounter (Signed)
Thersa Salt G, DO    I refilled the triamcinolone.  Recommend use of Flonase and an over-the-counter antihistamine for the congestion and "hanging in the throat".

## 2021-04-22 NOTE — Addendum Note (Signed)
Addended by: Dairl Ponder on: 04/22/2021 04:48 PM   Modules accepted: Orders

## 2021-04-22 NOTE — Telephone Encounter (Signed)
Patient advised per Dr Lacinda Axon: Dr Lacinda Axon refilled the triamcinolone and recommends use of Flonase and an over-the-counter antihistamine for the congestion and "hanging in the throat"  Patient verbalized understanding. Patient wonders if she needs labs before her appt. Her last lab work 09/2020

## 2021-04-22 NOTE — Telephone Encounter (Signed)
Blood work ordered in Epic. Left message to return call to notify patient. 

## 2021-04-22 NOTE — Telephone Encounter (Signed)
Left message to return call 

## 2021-04-22 NOTE — Telephone Encounter (Signed)
Thersa Salt G, DO    Please order CBC, CMP w/ gfr and lipid panel.

## 2021-04-24 ENCOUNTER — Other Ambulatory Visit: Payer: Self-pay | Admitting: Family Medicine

## 2021-04-24 DIAGNOSIS — I10 Essential (primary) hypertension: Secondary | ICD-10-CM | POA: Diagnosis not present

## 2021-04-24 DIAGNOSIS — E785 Hyperlipidemia, unspecified: Secondary | ICD-10-CM | POA: Diagnosis not present

## 2021-04-24 DIAGNOSIS — Z79899 Other long term (current) drug therapy: Secondary | ICD-10-CM | POA: Diagnosis not present

## 2021-04-25 LAB — COMPREHENSIVE METABOLIC PANEL
ALT: 45 IU/L — ABNORMAL HIGH (ref 0–32)
AST: 41 IU/L — ABNORMAL HIGH (ref 0–40)
Albumin/Globulin Ratio: 1.7 (ref 1.2–2.2)
Albumin: 4 g/dL (ref 3.8–4.9)
Alkaline Phosphatase: 113 IU/L (ref 44–121)
BUN/Creatinine Ratio: 16 (ref 12–28)
BUN: 19 mg/dL (ref 8–27)
Bilirubin Total: 0.3 mg/dL (ref 0.0–1.2)
CO2: 27 mmol/L (ref 20–29)
Calcium: 9.1 mg/dL (ref 8.7–10.3)
Chloride: 107 mmol/L — ABNORMAL HIGH (ref 96–106)
Creatinine, Ser: 1.18 mg/dL — ABNORMAL HIGH (ref 0.57–1.00)
Globulin, Total: 2.3 g/dL (ref 1.5–4.5)
Glucose: 96 mg/dL (ref 70–99)
Potassium: 3.9 mmol/L (ref 3.5–5.2)
Sodium: 144 mmol/L (ref 134–144)
Total Protein: 6.3 g/dL (ref 6.0–8.5)
eGFR: 53 mL/min/{1.73_m2} — ABNORMAL LOW (ref >59)

## 2021-04-25 LAB — CBC WITH DIFFERENTIAL/PLATELET
Basophils Absolute: 0 10*3/uL (ref 0.0–0.2)
Basos: 1 %
EOS (ABSOLUTE): 0.2 10*3/uL (ref 0.0–0.4)
Eos: 5 %
Hematocrit: 38.7 % (ref 34.0–46.6)
Hemoglobin: 12.9 g/dL (ref 11.1–15.9)
Immature Grans (Abs): 0 10*3/uL (ref 0.0–0.1)
Immature Granulocytes: 0 %
Lymphocytes Absolute: 1.5 10*3/uL (ref 0.7–3.1)
Lymphs: 39 %
MCH: 31.2 pg (ref 26.6–33.0)
MCHC: 33.3 g/dL (ref 31.5–35.7)
MCV: 94 fL (ref 79–97)
Monocytes Absolute: 0.3 10*3/uL (ref 0.1–0.9)
Monocytes: 8 %
Neutrophils Absolute: 1.8 10*3/uL (ref 1.4–7.0)
Neutrophils: 47 %
Platelets: 244 10*3/uL (ref 150–450)
RBC: 4.14 x10E6/uL (ref 3.77–5.28)
RDW: 12.7 % (ref 11.7–15.4)
WBC: 3.9 10*3/uL (ref 3.4–10.8)

## 2021-04-25 LAB — LIPID PANEL
Chol/HDL Ratio: 2.5 ratio (ref 0.0–4.4)
Cholesterol, Total: 142 mg/dL (ref 100–199)
HDL: 57 mg/dL (ref >39)
LDL Chol Calc (NIH): 71 mg/dL (ref 0–99)
Triglycerides: 69 mg/dL (ref 0–149)
VLDL Cholesterol Cal: 14 mg/dL (ref 5–40)

## 2021-04-27 ENCOUNTER — Other Ambulatory Visit: Payer: Self-pay

## 2021-04-27 ENCOUNTER — Ambulatory Visit (INDEPENDENT_AMBULATORY_CARE_PROVIDER_SITE_OTHER): Payer: Medicare Other | Admitting: Family Medicine

## 2021-04-27 VITALS — BP 143/72 | HR 76 | Temp 98.9°F | Ht 62.0 in | Wt 242.0 lb

## 2021-04-27 DIAGNOSIS — E785 Hyperlipidemia, unspecified: Secondary | ICD-10-CM | POA: Insufficient documentation

## 2021-04-27 DIAGNOSIS — G8929 Other chronic pain: Secondary | ICD-10-CM | POA: Diagnosis not present

## 2021-04-27 DIAGNOSIS — M179 Osteoarthritis of knee, unspecified: Secondary | ICD-10-CM | POA: Insufficient documentation

## 2021-04-27 DIAGNOSIS — M170 Bilateral primary osteoarthritis of knee: Secondary | ICD-10-CM | POA: Diagnosis not present

## 2021-04-27 DIAGNOSIS — N183 Chronic kidney disease, stage 3 unspecified: Secondary | ICD-10-CM | POA: Insufficient documentation

## 2021-04-27 DIAGNOSIS — I10 Essential (primary) hypertension: Secondary | ICD-10-CM | POA: Diagnosis not present

## 2021-04-27 DIAGNOSIS — M545 Low back pain, unspecified: Secondary | ICD-10-CM

## 2021-04-27 DIAGNOSIS — N1831 Chronic kidney disease, stage 3a: Secondary | ICD-10-CM | POA: Diagnosis not present

## 2021-04-27 MED ORDER — TRAMADOL HCL 50 MG PO TABS: 50.0000 mg | ORAL_TABLET | Freq: Three times a day (TID) | ORAL | 0 refills | Status: DC | PRN

## 2021-04-27 NOTE — Assessment & Plan Note (Signed)
Stable.  We will continue to monitor closely.  I advised her that she should use NSAIDs sparingly and for short period of time.

## 2021-04-27 NOTE — Assessment & Plan Note (Signed)
Stable. Continue Lipitor. 

## 2021-04-27 NOTE — Assessment & Plan Note (Signed)
Starting tramadol.

## 2021-04-27 NOTE — Progress Notes (Signed)
Subjective:  Patient ID: Jo Hale, female    DOB: Mar 03, 1961  Age: 61 y.o. MRN: [id number]  CC: Chief Complaint  Patient presents with   Establish Care    Both in both knees, left worse then right Lower back pain    HPI:  61 year old female with hypertension, CKD, history of renal cell carcinoma status post right partial nephrectomy, osteoarthritis of the knees, chronic low back pain presents for follow-up.  Patient reports continued ongoing back pain.  Has done physical therapy in the past.  Followed by orthopedics.  She uses hydrocodone as needed.  She does not like the way this medication makes her feel.  She would like to discuss other options regarding her pain today.  Patient also reports continued knee pain.  Follows with orthopedics.  Is currently on Voltaren which she takes once a day.  Hypertension is stable.  Fairly well controlled.  Patient is on triamterene HCTZ and doing well.  Hyperlipidemia is well controlled on Lipitor.  Recent labs revealed elevated liver enzymes. Will discuss with the patient today.  Patient Active Problem List   Diagnosis Date Noted   CKD (chronic kidney disease) stage 3, GFR 30-59 ml/min (HCC) 04/27/2021   Hyperlipidemia 04/27/2021   Chronic low back pain 04/27/2021   Knee osteoarthritis 04/27/2021   Neoplasm of right kidney 05/16/2017   Essential hypertension, benign 01/25/2013    Social Hx   Social History   Socioeconomic History   Marital status: Married    Spouse name: Fritz Pickerel   Number of children: 2   Years of education: Not on file   Highest education level: Not on file  Occupational History   Occupation: Print production planner, Emergency planning/management officer    Employer: [employer]  Tobacco Use   Smoking status: Never   Smokeless tobacco: Never  Vaping Use   Vaping Use: Never used  Substance and Sexual Activity   Alcohol use: No   Drug use: No   Sexual activity: Not Currently    Birth control/protection: Surgical    Comment: hyst  abd tubal  Other Topics Concern   Not on file  Social History Narrative   Married x 37 years in 2023.   1 son and 1 daughter   7 grandchildren.   Social Determinants of Health   Financial Resource Strain: Low Risk    Difficulty of Paying Living Expenses: Not hard at all  Food Insecurity: No Food Insecurity   Worried About Charity fundraiser in the Last Year: Never true   [place] in the Last Year: Never true  Transportation Needs: No Transportation Needs   Lack of Transportation (Medical): No   Lack of Transportation (Non-Medical): No  Physical Activity: Insufficiently Active   Days of Exercise per Week: 3 days   Minutes of Exercise per Session: 20 min  Stress: No Stress Concern Present   Feeling of Stress : Not at all  Social Connections: Socially Integrated   Frequency of Communication with Friends and Family: More than three times a week   Frequency of Social Gatherings with Friends and Family: More than three times a week   Attends Religious Services: More than 4 times per year   Active Member of [employer] or Organizations: Yes   Attends Music therapist: More than 4 times per year   Marital Status: Married    Review of Systems Per HPI  Objective:  BP (!) 143/72    Pulse 76    Temp 98.9  F (37.2 C) (Oral)    Ht 5\' 2"  (1.575 m)    Wt 242 lb (109.8 kg)    LMP 02/17/2011 Comment: partial    SpO2 97%    BMI 44.26 kg/m   BP/Weight 04/27/2021 04/21/2021 17/61/6073  Systolic BP 710 626 -  Diastolic BP 72 70 -  Wt. (Lbs) 242 243 242  BMI 44.26 44.45 44.26    Physical Exam Vitals and nursing note reviewed.  Constitutional:      General: She is not in acute distress.    Appearance: Normal appearance. She is obese. She is not ill-appearing.  HENT:     Head: Normocephalic and atraumatic.  Eyes:     General:        Right eye: No discharge.        Left eye: No discharge.     Conjunctiva/sclera: Conjunctivae normal.  Cardiovascular:     Rate and Rhythm:  Normal rate and regular rhythm.  Pulmonary:     Effort: Pulmonary effort is normal.     Breath sounds: Normal breath sounds. No wheezing, rhonchi or rales.  Neurological:     Mental Status: She is alert.  Psychiatric:        Mood and Affect: Mood normal.        Behavior: Behavior normal.    Lab Results  Component Value Date   WBC 3.9 04/24/2021   HGB 12.9 04/24/2021   HCT 38.7 04/24/2021   PLT 244 04/24/2021   GLUCOSE 96 04/24/2021   CHOL 142 04/24/2021   TRIG 69 04/24/2021   HDL 57 04/24/2021   LDLCALC 71 04/24/2021   ALT 45 (H) 04/24/2021   AST 41 (H) 04/24/2021   NA 144 04/24/2021   K 3.9 04/24/2021   CL 107 (H) 04/24/2021   CREATININE 1.18 (H) 04/24/2021   BUN 19 04/24/2021   CO2 27 04/24/2021   TSH 1.310 01/14/2020   INR 1.07 07/02/2015   HGBA1C 5.6 09/29/2018     Assessment & Plan:   Problem List Items Addressed This Visit       Cardiovascular and Mediastinum   Essential hypertension, benign - Primary    Stable.  Continue triamterene/HCTZ.        Musculoskeletal and Integument   Knee osteoarthritis    Advised brief use of Voltaren.  Follow-up with orthopedics.      Relevant Medications   traMADol (ULTRAM) 50 MG tablet     Genitourinary   CKD (chronic kidney disease) stage 3, GFR 30-59 ml/min (HCC)    Stable.  We will continue to monitor closely.  I advised her that she should use NSAIDs sparingly and for short period of time.        Other   Hyperlipidemia    Stable.  Continue Lipitor.      Chronic low back pain    Starting tramadol.      Relevant Medications   traMADol (ULTRAM) 50 MG tablet    Meds ordered this encounter  Medications   traMADol (ULTRAM) 50 MG tablet    Sig: Take 1-2 tablets (50-100 mg total) by mouth every 8 (eight) hours as needed.    Dispense:  20 tablet    Refill:  0    Follow-up:  Return in about 6 months (around 10/25/2021).  [place]

## 2021-04-27 NOTE — Assessment & Plan Note (Signed)
Advised brief use of Voltaren.  Follow-up with orthopedics.

## 2021-04-27 NOTE — Assessment & Plan Note (Signed)
Stable.  Continue triamterene/HCTZ. 

## 2021-04-27 NOTE — Telephone Encounter (Signed)
Patient completed lab work 04/24/21 and had appt with Dr Lacinda Axon 04/27/21

## 2021-04-27 NOTE — Patient Instructions (Signed)
Tramadol as directed.  Use the diclofenac sparingly.  Be sure to follow up with Dr. Aline Brochure.  Follow up in 6 months.  Take care  Dr. Lacinda Axon

## 2021-05-11 ENCOUNTER — Ambulatory Visit (INDEPENDENT_AMBULATORY_CARE_PROVIDER_SITE_OTHER): Payer: 59 | Admitting: Orthopedic Surgery

## 2021-05-11 ENCOUNTER — Other Ambulatory Visit: Payer: Self-pay

## 2021-05-11 ENCOUNTER — Encounter: Payer: Self-pay | Admitting: Orthopedic Surgery

## 2021-05-11 VITALS — Ht 62.0 in | Wt 240.0 lb

## 2021-05-11 DIAGNOSIS — M25562 Pain in left knee: Secondary | ICD-10-CM

## 2021-05-11 DIAGNOSIS — G8929 Other chronic pain: Secondary | ICD-10-CM

## 2021-05-11 NOTE — Patient Instructions (Signed)
While we are working on your approval for MRI please go ahead and call to schedule your appointment with [facility] within at least one (1) week.   Central Scheduling [phone]  

## 2021-05-11 NOTE — Progress Notes (Signed)
Chief Complaint  Patient presents with   Knee Pain    LT knee Pain comes and goes   61 year old female with chronic pain left knee complains of pain behind the knee and swelling intermittently she is on Voltaren and Ultram she says her primary care doctor took her off the diclofenac although she was not having any trouble and it was working pretty well  She is concerned that the symptoms continue to occur.  She does have some catching sensations in the knee and occasional giving way  Her BMI is 43.9 so she is not a good candidate for arthroplasty  Her x-rays show mild narrowing of the joint  She is concerned about a tear so we decided to proceed with an MRI of the left knee for possible medial meniscal tear  Examination findings include patient has obesity she is awake and alert she is oriented x3 mood is pleasant affect is normal  Left knee skin is normal she has an effusion  She has pain with extension of the knee and a block to extension of 5 degrees she can flex it 105 degrees and some of this motion may be limited by the size of her leg the knee feels stable to meniscal signs are positive such as McMurray's and terminal extension test  Recommend MRI left knee  Return for results

## 2021-05-22 ENCOUNTER — Other Ambulatory Visit: Payer: Self-pay

## 2021-05-22 ENCOUNTER — Ambulatory Visit (HOSPITAL_COMMUNITY)
Admission: RE | Admit: 2021-05-22 | Discharge: 2021-05-22 | Disposition: A | Discharge status: Home / Self Care | Payer: 59 | Source: Ambulatory Visit | Attending: Orthopedic Surgery | Admitting: Orthopedic Surgery

## 2021-05-22 DIAGNOSIS — M25562 Pain in left knee: Secondary | ICD-10-CM | POA: Diagnosis not present

## 2021-05-22 DIAGNOSIS — G8929 Other chronic pain: Secondary | ICD-10-CM | POA: Diagnosis not present

## 2021-06-01 ENCOUNTER — Ambulatory Visit (INDEPENDENT_AMBULATORY_CARE_PROVIDER_SITE_OTHER): Payer: 59 | Admitting: Orthopedic Surgery

## 2021-06-01 ENCOUNTER — Encounter: Payer: Self-pay | Admitting: Orthopedic Surgery

## 2021-06-01 ENCOUNTER — Telehealth: Payer: Self-pay

## 2021-06-01 ENCOUNTER — Other Ambulatory Visit: Payer: Self-pay

## 2021-06-01 DIAGNOSIS — G8929 Other chronic pain: Secondary | ICD-10-CM

## 2021-06-01 DIAGNOSIS — M25562 Pain in left knee: Secondary | ICD-10-CM

## 2021-06-01 DIAGNOSIS — M1712 Unilateral primary osteoarthritis, left knee: Secondary | ICD-10-CM

## 2021-06-01 NOTE — Progress Notes (Signed)
Chief Complaint  Patient presents with   Results    MRI review left knee    Encounter Diagnoses  Name Primary?   Primary localized osteoarthritis of left knee Yes   Chronic pain of left knee     Jo Hale had her MRI.  She still having left knee pain as we described in her last visit on January 30.  She has pain and intermittent swelling behind the knee.  She has been treated with Voltaren and tramadol but her primary care physician took her off of her NSAIDs because of her kidney function which I checked with him and she is having some slowing of the kidneys in terms of perfusion  I read her MRI she primarily has 3 compartment disease with full-thickness and partial-thickness cartilage loss her meniscal extrusion and free edge meniscal tear is inconsequential  She also has some reactive bone edema  We discussed this at length  She is amenable to ordering hyaluronic acid injections,  Taking glucosamine chondroitin sulfate supplements  We talked about exercise on a stationary bike and weight loss to include diet changes including avoiding sweetened drinks, avoiding [place] and avoiding bread  Goal weight is 215 pounds.

## 2021-06-01 NOTE — Telephone Encounter (Signed)
Pt called in stating that she is still experiencing some wheezing and having a dry cough. Pt would like to have a call back about this. Pt would like to know if she should schedule another ov or can she have something called into pharmacy. Please advise.  Cb#: [phone]

## 2021-06-01 NOTE — Patient Instructions (Signed)
Goal weight is 215  Avoid pastas, breads and sweetened drinks  Exercise stationary bike  Supplements glucosamine/chondroitin sulfate

## 2021-06-01 NOTE — Telephone Encounter (Signed)
Cook, Jayce G, DO  You 1 hour ago (12:03 PM)   It is up to her if she would like to be seen. Send in Tessalon perles 200 mg (1 three times daily as needed for cough); # 30 no refills.

## 2021-06-01 NOTE — Telephone Encounter (Signed)
Patient notified and stated she can hear her chest rattle and the mucus is thick and discolored. Patient scheduled office visit tomorrow at     10:40 am with Dr Lacinda Axon for evaluation

## 2021-06-02 ENCOUNTER — Encounter: Payer: Self-pay | Admitting: Family Medicine

## 2021-06-02 ENCOUNTER — Ambulatory Visit (INDEPENDENT_AMBULATORY_CARE_PROVIDER_SITE_OTHER): Payer: Medicare Other | Admitting: Family Medicine

## 2021-06-02 VITALS — BP 132/70 | HR 73 | Temp 98.3°F | Ht 62.0 in | Wt 246.0 lb

## 2021-06-02 DIAGNOSIS — J209 Acute bronchitis, unspecified: Secondary | ICD-10-CM | POA: Diagnosis not present

## 2021-06-02 MED ORDER — PREDNISONE 50 MG PO TABS: ORAL_TABLET | ORAL | 0 refills | Status: DC

## 2021-06-02 MED ORDER — DOXYCYCLINE HYCLATE 100 MG PO TABS: 100.0000 mg | ORAL_TABLET | Freq: Two times a day (BID) | ORAL | 0 refills | Status: DC

## 2021-06-02 MED ORDER — ALBUTEROL SULFATE HFA 108 (90 BASE) MCG/ACT IN AERS: 2.0000 | INHALATION_SPRAY | RESPIRATORY_TRACT | 3 refills | Status: DC | PRN

## 2021-06-02 NOTE — Assessment & Plan Note (Signed)
Treating with prednisone, doxycycline, albuterol.

## 2021-06-02 NOTE — Progress Notes (Signed)
Subjective:  Patient ID: Jo Hale, female    DOB: September 26, 1960  Age: 61 y.o. MRN: [id number]  CC: Chief Complaint  Patient presents with   Cough    Dry, x 1 week- chest feels congested and has green mucous  No covid testing     HPI:  61 year old presents for  Patient reports ongoing cough over the past week.  Associated wheezing and "rattling" in the chest.  Seems to be worse at night.  Husband has recently been sick.  No fever.  No shortness of breath.  No COVID testing.  Patient states that she does not have her inhaler.  She states that she is out of this medication.  Patient reports that her cough is productive of discolored sputum.  She reports some nasal congestion but this improves with nasal spray.  Patient Active Problem List   Diagnosis Date Noted   Acute bronchitis 06/02/2021   CKD (chronic kidney disease) stage 3, GFR 30-59 ml/min (HCC) 04/27/2021   Hyperlipidemia 04/27/2021   Chronic low back pain 04/27/2021   Knee osteoarthritis 04/27/2021   Neoplasm of right kidney 05/16/2017   Essential hypertension, benign 01/25/2013    Social Hx   Social History   Socioeconomic History   Marital status: Married    Spouse name: Fritz Pickerel   Number of children: 2   Years of education: Not on file   Highest education level: Not on file  Occupational History   Occupation: Print production planner, Emergency planning/management officer    Employer: [employer]  Tobacco Use   Smoking status: Never   Smokeless tobacco: Never  Vaping Use   Vaping Use: Never used  Substance and Sexual Activity   Alcohol use: No   Drug use: No   Sexual activity: Not Currently    Birth control/protection: Surgical    Comment: hyst abd tubal  Other Topics Concern   Not on file  Social History Narrative   Married x 37 years in 2023.   1 son and 1 daughter   7 grandchildren.   Social Determinants of Health   Financial Resource Strain: Low Risk    Difficulty of Paying Living Expenses: Not hard at all  Food  Insecurity: No Food Insecurity   Worried About Charity fundraiser in the Last Year: Never true   [place] in the Last Year: Never true  Transportation Needs: No Transportation Needs   Lack of Transportation (Medical): No   Lack of Transportation (Non-Medical): No  Physical Activity: Insufficiently Active   Days of Exercise per Week: 3 days   Minutes of Exercise per Session: 20 min  Stress: No Stress Concern Present   Feeling of Stress : Not at all  Social Connections: Socially Integrated   Frequency of Communication with Friends and Family: More than three times a week   Frequency of Social Gatherings with Friends and Family: More than three times a week   Attends Religious Services: More than 4 times per year   Active Member of [employer] or Organizations: Yes   Attends Music therapist: More than 4 times per year   Marital Status: Married    Review of Systems Per HPI  Objective:  BP 132/70    Pulse 73    Temp 98.3 F (36.8 C)    Ht 5\' 2"  (1.575 m)    Wt 246 lb (111.6 kg)    LMP 02/17/2011 Comment: partial    SpO2 99%    BMI 44.99 kg/m  BP/Weight 06/02/2021 05/11/2021 3/83/2919  Systolic BP 166 - 060  Diastolic BP 70 - 72  Wt. (Lbs) 246 240 242  BMI 44.99 43.9 44.26    Physical Exam Vitals and nursing note reviewed.  Constitutional:      General: She is not in acute distress.    Appearance: Normal appearance. She is not ill-appearing.  HENT:     Mouth/Throat:     Pharynx: Oropharynx is clear.  Eyes:     General:        Right eye: No discharge.        Left eye: No discharge.     Conjunctiva/sclera: Conjunctivae normal.  Cardiovascular:     Rate and Rhythm: Normal rate and regular rhythm.  Pulmonary:     Effort: Pulmonary effort is normal.     Breath sounds: Wheezing present. No rales.  Neurological:     Mental Status: She is alert.  Psychiatric:        Mood and Affect: Mood normal.        Behavior: Behavior normal.    Lab Results  Component  Value Date   WBC 3.9 04/24/2021   HGB 12.9 04/24/2021   HCT 38.7 04/24/2021   PLT 244 04/24/2021   GLUCOSE 96 04/24/2021   CHOL 142 04/24/2021   TRIG 69 04/24/2021   HDL 57 04/24/2021   LDLCALC 71 04/24/2021   ALT 45 (H) 04/24/2021   AST 41 (H) 04/24/2021   NA 144 04/24/2021   K 3.9 04/24/2021   CL 107 (H) 04/24/2021   CREATININE 1.18 (H) 04/24/2021   BUN 19 04/24/2021   CO2 27 04/24/2021   TSH 1.310 01/14/2020   INR 1.07 07/02/2015   HGBA1C 5.6 09/29/2018     Assessment & Plan:   Problem List Items Addressed This Visit       Respiratory   Acute bronchitis - Primary    Treating with prednisone, doxycycline, albuterol.       Meds ordered this encounter  Medications   albuterol (VENTOLIN HFA) 108 (90 Base) MCG/ACT inhaler    Sig: Inhale 2 puffs into the lungs every 4 (four) hours as needed for wheezing or shortness of breath.    Dispense:  18 g    Refill:  3   predniSONE (DELTASONE) 50 MG tablet    Sig: 1 tablet daily x 5 days    Dispense:  5 tablet    Refill:  0   doxycycline (VIBRA-TABS) 100 MG tablet    Sig: Take 1 tablet (100 mg total) by mouth 2 (two) times daily.    Dispense:  14 tablet    Refill:  [place]

## 2021-06-18 ENCOUNTER — Telehealth: Payer: Self-pay | Admitting: Radiology

## 2021-06-18 NOTE — Telephone Encounter (Signed)
I called patient to discuss Synvisc injections.  She wants to know if the puffiness in her posterior left knee (she thinks you told her it was a Baker's cyst) will improve after these injections?  Or what can be done to help that?  It is worse by end of day and less swelling by next morning after rest/overnight.  Please advise.  I will call her back first of week to discuss.  In the meantime she will look online at Synvisc and let me know when I call if she wants to schedule appointments for injections.  ?

## 2021-06-19 NOTE — Telephone Encounter (Signed)
I called patient and advised, she will call back to discuss how she wants to proceed on an US guided aspiration and on the Synvisc.   ?

## 2021-07-07 ENCOUNTER — Telehealth: Payer: Self-pay | Admitting: Adult Health

## 2021-07-07 NOTE — Telephone Encounter (Signed)
Pt aware she had labs in Jan. JAG states she can repeat labs but she expects her PCP will do that. Pt voiced understanding. JSY ?

## 2021-07-07 NOTE — Telephone Encounter (Signed)
Patient is coming in on 09/01/21 for her pap/physical and was wanting to see if she needed to do any blood work before then.  ?

## 2021-08-21 DIAGNOSIS — Z85528 Personal history of other malignant neoplasm of kidney: Secondary | ICD-10-CM | POA: Diagnosis not present

## 2021-08-24 DIAGNOSIS — Z85528 Personal history of other malignant neoplasm of kidney: Secondary | ICD-10-CM | POA: Diagnosis not present

## 2021-08-26 DIAGNOSIS — Z85528 Personal history of other malignant neoplasm of kidney: Secondary | ICD-10-CM | POA: Diagnosis not present

## 2021-09-01 ENCOUNTER — Other Ambulatory Visit: Payer: 59 | Admitting: Adult Health

## 2021-09-10 ENCOUNTER — Encounter: Payer: Self-pay | Admitting: Adult Health

## 2021-09-10 ENCOUNTER — Ambulatory Visit (INDEPENDENT_AMBULATORY_CARE_PROVIDER_SITE_OTHER): Payer: 59 | Admitting: Adult Health

## 2021-09-10 VITALS — BP 116/75 | HR 71 | Ht 63.0 in | Wt 237.2 lb

## 2021-09-10 DIAGNOSIS — Z90711 Acquired absence of uterus with remaining cervical stump: Secondary | ICD-10-CM

## 2021-09-10 DIAGNOSIS — Z01419 Encounter for gynecological examination (general) (routine) without abnormal findings: Secondary | ICD-10-CM | POA: Insufficient documentation

## 2021-09-10 DIAGNOSIS — Z1211 Encounter for screening for malignant neoplasm of colon: Secondary | ICD-10-CM

## 2021-09-10 LAB — HEMOCCULT GUIAC POC 1CARD (OFFICE): Fecal Occult Blood, POC: NEGATIVE

## 2021-09-10 NOTE — Progress Notes (Signed)
Patient ID: Jo Hale, female   DOB: November 15, 1960, 61 y.o.   MRN: [id number] History of Present Illness: Jo Hale is a 61 year old black female,married, sp [facility], in for well woman gyn exam. She says her kidney doctor is watching spot in her lung.  Lab Results  Component Value Date   DIAGPAP  08/27/2020    - Negative for intraepithelial lesion or malignancy (NILM)   HPV NOT DETECTED 09/01/2017   [place] Negative 08/27/2020   PCP is Dr Lacinda Axon   Current Medications, Allergies, Past Medical History, Past Surgical History, Family History and Social History were reviewed in [facility] record.     Review of Systems: Patient denies any headaches, hearing loss, fatigue, blurred vision, shortness of breath, chest pain, abdominal pain, problems with bowel movements, urination,(occasional UI) or intercourse(not active). No mood swings.  Has had pain left knee has seen Dr Aline Brochure and now pain right heel    Physical Exam:BP 116/75 (BP Location: Left Arm, Patient Position: Sitting, Cuff Size: Large)   Pulse 71   Ht '5\' 3"'$  (1.6 m)   Wt 237 lb 3.2 oz (107.6 kg)   LMP 02/17/2011 Comment: partial   BMI 42.02 kg/m   General:  Well developed, well nourished, no acute distress Skin:  Warm and dry Neck:  Midline trachea, normal thyroid, good ROM, no lymphadenopathy,no carotid bruits heard Lungs; Clear to auscultation bilaterally Breast:  No dominant palpable mass, retraction, or nipple discharge Cardiovascular: Regular rate and rhythm Abdomen:  Soft, non tender, no hepatosplenomegaly Pelvic:  External genitalia is normal in appearance, no lesions.  The vagina is pale with loss of rugae. Urethra has no lesions or masses. The cervix is smooth.  Uterus is absent.  No adnexal masses or tenderness noted.Bladder is non tender, no masses felt. Rectal: Good sphincter tone, no polyps, or hemorrhoids felt.  Hemoccult negative. Extremities/musculoskeletal:  No swelling or varicosities  noted, no clubbing or cyanosis Psych:  No mood changes, alert and cooperative,seems happy AA is 0 Fall risk is low    09/10/2021    2:18 PM 04/21/2021    8:40 AM 09/11/2020    8:45 AM  Depression screen PHQ 2/9  Decreased Interest 0 0 0  Down, Depressed, Hopeless 0 0 0  PHQ - 2 Score 0 0 0  Altered sleeping 0    Tired, decreased energy 0    Change in appetite 0    Feeling bad or failure about yourself  0    Trouble concentrating 0    Moving slowly or fidgety/restless 0    Suicidal thoughts 0    PHQ-9 Score 0         09/10/2021    2:19 PM 08/27/2020    9:43 AM  GAD 7 : Generalized Anxiety Score  Nervous, Anxious, on Edge 0 0  Control/stop worrying 0 0  Worry too much - different things 0 0  Trouble relaxing 0 0  Restless 0 0  Easily annoyed or irritable 0 0  Afraid - awful might happen 0 0  Total GAD 7 Score 0 0      [place] - 09/10/21 1419       Pregnancy Intention Screening   Does the patient want to become pregnant in the next year? No    Does the patient's partner want to become pregnant in the next year? No    Would the patient like to discuss contraceptive options today? No      Contraception Wrap Up  Current Method Female Sterilization   hyst   End Method Female Sterilization   hyst   Contraception Counseling Provided No             Examination chaperoned by Celene Squibb LPN  Impression and Plan: 1. Encounter for well woman exam with routine gynecological exam Pap in 2025  Physical in 1 year Mammogram was normal 09/18/20 Colonoscopy per GI Labs with PCP Follow up with Dr Aline Brochure on knee   2. Encounter for screening fecal occult blood testing Hemoccult negative   3. S/P abdominal supracervical subtotal hysterectomy

## 2021-10-15 ENCOUNTER — Telehealth: Payer: Self-pay

## 2021-10-15 NOTE — Telephone Encounter (Signed)
Caller name:Jo Hale   On DPR? :Yes  Call back number:[phone]  Provider they see: Lacinda Axon   Reason for call:Pt dropped off [place] to be completed

## 2021-10-21 ENCOUNTER — Telehealth: Payer: Self-pay

## 2021-10-21 ENCOUNTER — Other Ambulatory Visit: Payer: Self-pay | Admitting: *Deleted

## 2021-10-21 DIAGNOSIS — I10 Essential (primary) hypertension: Secondary | ICD-10-CM

## 2021-10-21 DIAGNOSIS — E785 Hyperlipidemia, unspecified: Secondary | ICD-10-CM

## 2021-10-21 DIAGNOSIS — N1831 Chronic kidney disease, stage 3a: Secondary | ICD-10-CM

## 2021-10-21 NOTE — Telephone Encounter (Signed)
LM on VM (with patient identifying herself) that labs have been ordered, if any questions to give [place] a call back.

## 2021-10-21 NOTE — Telephone Encounter (Signed)
Lipid, liver, metabolic 7, urine ACR Hyperlipidemia hypertension Please alert the patient that there is a urine test for protein with her lab work thank you

## 2021-10-21 NOTE — Telephone Encounter (Signed)
Last labs completed 04/24/21-CBC w/Diff/Platelet; CMP and Lipid. Please advise. Thank you

## 2021-10-21 NOTE — Telephone Encounter (Signed)
Caller name:Ezell Adah Perl   On DPR? :Yes  Call back number:[phone]  Provider they see: Lacinda Axon   Reason for call: Pt has appt on Tuesday the 18th will she need blood work ordered before her appt ?

## 2021-10-22 DIAGNOSIS — N1831 Chronic kidney disease, stage 3a: Secondary | ICD-10-CM | POA: Diagnosis not present

## 2021-10-22 DIAGNOSIS — I10 Essential (primary) hypertension: Secondary | ICD-10-CM | POA: Diagnosis not present

## 2021-10-22 DIAGNOSIS — E785 Hyperlipidemia, unspecified: Secondary | ICD-10-CM | POA: Diagnosis not present

## 2021-10-23 ENCOUNTER — Other Ambulatory Visit (HOSPITAL_COMMUNITY): Payer: Self-pay | Admitting: Family Medicine

## 2021-10-23 DIAGNOSIS — Z1231 Encounter for screening mammogram for malignant neoplasm of breast: Secondary | ICD-10-CM

## 2021-10-23 LAB — LIPID PANEL
Chol/HDL Ratio: 2.5 ratio (ref 0.0–4.4)
Cholesterol, Total: 143 mg/dL (ref 100–199)
HDL: 57 mg/dL (ref >39)
LDL Chol Calc (NIH): 73 mg/dL (ref 0–99)
Triglycerides: 64 mg/dL (ref 0–149)
VLDL Cholesterol Cal: 13 mg/dL (ref 5–40)

## 2021-10-23 LAB — MICROALBUMIN / CREATININE URINE RATIO
Creatinine, Urine: 192.3 mg/dL
Microalb/Creat Ratio: 2 mg/g creat (ref 0–29)
Microalbumin, Urine: 3.5 ug/mL

## 2021-10-23 LAB — BASIC METABOLIC PANEL
BUN/Creatinine Ratio: 13 (ref 12–28)
BUN: 15 mg/dL (ref 8–27)
CO2: 25 mmol/L (ref 20–29)
Calcium: 9.2 mg/dL (ref 8.7–10.3)
Chloride: 106 mmol/L (ref 96–106)
Creatinine, Ser: 1.14 mg/dL — ABNORMAL HIGH (ref 0.57–1.00)
Glucose: 104 mg/dL — ABNORMAL HIGH (ref 70–99)
Potassium: 3.6 mmol/L (ref 3.5–5.2)
Sodium: 143 mmol/L (ref 134–144)
eGFR: 55 mL/min/{1.73_m2} — ABNORMAL LOW (ref >59)

## 2021-10-23 LAB — HEPATIC FUNCTION PANEL
ALT: 23 IU/L (ref 0–32)
AST: 21 IU/L (ref 0–40)
Albumin: 4.3 g/dL (ref 3.8–4.9)
Alkaline Phosphatase: 104 IU/L (ref 44–121)
Bilirubin Total: 0.4 mg/dL (ref 0.0–1.2)
Bilirubin, Direct: 0.13 mg/dL (ref 0.00–0.40)
Total Protein: 6.6 g/dL (ref 6.0–8.5)

## 2021-10-26 ENCOUNTER — Ambulatory Visit (HOSPITAL_COMMUNITY)
Admission: RE | Admit: 2021-10-26 | Discharge: 2021-10-26 | Disposition: A | Discharge status: Home / Self Care | Payer: 59 | Source: Ambulatory Visit | Attending: Family Medicine | Admitting: Family Medicine

## 2021-10-26 DIAGNOSIS — Z1231 Encounter for screening mammogram for malignant neoplasm of breast: Secondary | ICD-10-CM | POA: Diagnosis not present

## 2021-10-27 ENCOUNTER — Ambulatory Visit (INDEPENDENT_AMBULATORY_CARE_PROVIDER_SITE_OTHER): Payer: 59 | Admitting: Family Medicine

## 2021-10-27 DIAGNOSIS — I10 Essential (primary) hypertension: Secondary | ICD-10-CM

## 2021-10-27 DIAGNOSIS — N1831 Chronic kidney disease, stage 3a: Secondary | ICD-10-CM

## 2021-10-27 DIAGNOSIS — E785 Hyperlipidemia, unspecified: Secondary | ICD-10-CM

## 2021-10-27 NOTE — Progress Notes (Signed)
Subjective:  Patient ID: Jo Hale, female    DOB: Jan 13, 1961  Age: 61 y.o. MRN: [id number]  CC: Chief Complaint  Patient presents with   Hypertension    Blood pressure doing ok. Pt states she is needing a paper filled out.     HPI:  61 year old female with hypertension, chronic kidney disease, hyperlipidemia, history of stroke, chronic low back pain, chronic knee pain presents for follow-up.  Patient requesting a form be filled out regarding insurance benefits from a prior accident she had in 2015.  We will fill the form out with the patient today.   Patient follows with orthopedics regarding knee pain and other musculoskeletal pain.  I advised her to use NSAIDs sparingly given chronic kidney disease.  I would prefer that she not use them at all but an occasional use is okay to help with her pain.  Hypertension is stable on triamterene/HCTZ.  Lipids have been fairly well controlled.  Last LDL was 73.  She is tolerating statin without difficulty.  Patient Active Problem List   Diagnosis Date Noted   S/P abdominal supracervical subtotal hysterectomy 09/10/2021   CKD (chronic kidney disease) stage 3, GFR 30-59 ml/min (HCC) 04/27/2021   Hyperlipidemia 04/27/2021   Chronic low back pain 04/27/2021   Knee osteoarthritis 04/27/2021   Neoplasm of right kidney 05/16/2017   History of stroke 09/18/2015   Essential hypertension, benign 01/25/2013    Social Hx   Social History   Socioeconomic History   Marital status: Married    Spouse name: Fritz Pickerel   Number of children: 2   Years of education: Not on file   Highest education level: Not on file  Occupational History   Occupation: Print production planner, Emergency planning/management officer    Employer: [employer]  Tobacco Use   Smoking status: Never   Smokeless tobacco: Never  Vaping Use   Vaping Use: Never used  Substance and Sexual Activity   Alcohol use: No   Drug use: No   Sexual activity: Not Currently    Birth control/protection: Surgical     Comment: hyst abd tubal  Other Topics Concern   Not on file  Social History Narrative   Married x 37 years in 2023.   1 son and 1 daughter   7 grandchildren.   Social Determinants of Health   Financial Resource Strain: Low Risk  (09/10/2021)   Overall Financial Resource Strain (CARDIA)    Difficulty of Paying Living Expenses: Not hard at all  Food Insecurity: No Food Insecurity (09/10/2021)   Hunger Vital Sign    Worried About Running Out of Food in the Last Year: Never true    Ran Out of Food in the Last Year: Never true  Transportation Needs: No Transportation Needs (09/10/2021)   PRAPARE - Hydrologist (Medical): No    Lack of Transportation (Non-Medical): No  Physical Activity: Insufficiently Active (09/10/2021)   Exercise Vital Sign    Days of Exercise per Week: 1 day    Minutes of Exercise per Session: 30 min  Stress: No Stress Concern Present (09/10/2021)   [place]    Feeling of Stress : Not at all  Social Connections: [place] (09/10/2021)   Social Connection and Isolation Panel [NHANES]    Frequency of Communication with Friends and Family: More than three times a week    Frequency of Social Gatherings with Friends and Family: Three times a week  Attends Religious Services: More than 4 times per year    Active Member of Clubs or Organizations: Yes    Attends Archivist Meetings: More than 4 times per year    Marital Status: Married    Review of Systems  Constitutional: Negative.   Musculoskeletal:  Positive for arthralgias.   Objective:  BP 132/74   Pulse 72   Temp 97.7 F (36.5 C)   Wt 236 lb 6.4 oz (107.2 kg)   LMP 02/17/2011 Comment: partial   SpO2 98%   BMI 41.88 kg/m      10/27/2021    8:38 AM 09/10/2021    2:22 PM 06/02/2021   10:43 AM  BP/Weight  Systolic BP 161 096 045  Diastolic BP 74 75 70  Wt. (Lbs) 236.4 237.2 246  BMI 41.88  kg/m2 42.02 kg/m2 44.99 kg/m2    Physical Exam Vitals and nursing note reviewed.  Constitutional:      General: She is not in acute distress.    Appearance: Normal appearance. She is obese.  HENT:     Head: Normocephalic and atraumatic.  Eyes:     General:        Right eye: No discharge.        Left eye: No discharge.     Conjunctiva/sclera: Conjunctivae normal.  Cardiovascular:     Rate and Rhythm: Normal rate and regular rhythm.  Pulmonary:     Effort: Pulmonary effort is normal.     Breath sounds: Normal breath sounds. No wheezing, rhonchi or rales.  Neurological:     Mental Status: She is alert.  Psychiatric:        Mood and Affect: Mood normal.        Behavior: Behavior normal.     Lab Results  Component Value Date   WBC 3.9 04/24/2021   HGB 12.9 04/24/2021   HCT 38.7 04/24/2021   PLT 244 04/24/2021   GLUCOSE 104 (H) 10/22/2021   CHOL 143 10/22/2021   TRIG 64 10/22/2021   HDL 57 10/22/2021   LDLCALC 73 10/22/2021   ALT 23 10/22/2021   AST 21 10/22/2021   NA 143 10/22/2021   K 3.6 10/22/2021   CL 106 10/22/2021   CREATININE 1.14 (H) 10/22/2021   BUN 15 10/22/2021   CO2 25 10/22/2021   TSH 1.310 01/14/2020   INR 1.07 07/02/2015   HGBA1C 5.6 09/29/2018     Assessment & Plan:   Problem List Items Addressed This Visit       Cardiovascular and Mediastinum   Essential hypertension, benign    Stable.  Continue triamterene/HCTZ.        Genitourinary   CKD (chronic kidney disease) stage 3, GFR 30-59 ml/min (HCC)    Stable.  We will continue to monitor closely.  Advised to use NSAIDs sparingly if at all.        Other   Hyperlipidemia    Stable.  Continue Lipitor.      Follow-up:  Return in about 6 months (around 04/29/2022).  [place]

## 2021-10-27 NOTE — Assessment & Plan Note (Signed)
Stable.  We will continue to monitor closely.  Advised to use NSAIDs sparingly if at all.

## 2021-10-27 NOTE — Assessment & Plan Note (Signed)
Stable.  Continue triamterene/HCTZ.

## 2021-10-27 NOTE — Assessment & Plan Note (Signed)
Stable. Continue Lipitor. 

## 2021-10-27 NOTE — Patient Instructions (Signed)
Be sure you take a baby aspirin daily.  Continue your current medications. Use NSAID's sparingly.  Follow up in 6 months.  Take care  Dr. Lacinda Axon

## 2021-10-29 ENCOUNTER — Ambulatory Visit: Payer: 59 | Admitting: Orthopedic Surgery

## 2021-11-16 ENCOUNTER — Ambulatory Visit: Payer: 59 | Admitting: Orthopedic Surgery

## 2021-11-26 ENCOUNTER — Ambulatory Visit (INDEPENDENT_AMBULATORY_CARE_PROVIDER_SITE_OTHER): Payer: 59 | Admitting: Orthopedic Surgery

## 2021-11-26 ENCOUNTER — Encounter: Payer: Self-pay | Admitting: Orthopedic Surgery

## 2021-11-26 ENCOUNTER — Ambulatory Visit (INDEPENDENT_AMBULATORY_CARE_PROVIDER_SITE_OTHER): Payer: 59

## 2021-11-26 VITALS — BP 118/71 | HR 72 | Ht 63.0 in | Wt 239.0 lb

## 2021-11-26 DIAGNOSIS — M2141 Flat foot [pes planus] (acquired), right foot: Secondary | ICD-10-CM | POA: Diagnosis not present

## 2021-11-26 DIAGNOSIS — M79671 Pain in right foot: Secondary | ICD-10-CM

## 2021-11-26 DIAGNOSIS — M722 Plantar fascial fibromatosis: Secondary | ICD-10-CM | POA: Diagnosis not present

## 2021-11-26 DIAGNOSIS — M7731 Calcaneal spur, right foot: Secondary | ICD-10-CM | POA: Diagnosis not present

## 2021-11-26 NOTE — Progress Notes (Signed)
Chief Complaint  Patient presents with   Foot Pain    Rt heel pain for 1 mo, NKI.    61 year old female presents for evaluation of right heel pain x1 month.  Pain is located on the bottom of the heel on the plantar aspect.  It is worse at night.  She appears to have start up pain and restart pain.  Exam shows a pes planus which is flexible nontender Achilles skin intact tenderness on the plantar aspect of the heel would rated as mild, ankle range of motion normal subtalar motion normal no atrophy noted in the plantar aspect of the heel or the foot  Other factors.  Patient does have some kidney disease, she is on Protonix as well.  She is no longer taking the anti-inflammatories.  Xrays in house : X-ray shows plantar spur flatfoot no other major deformity  Diagnosis is  Encounter Diagnoses  Name Primary?   Pain of right heel    Plantar fasciitis Yes    Routine course from [place] fasciitis Ice Heel cup Supportive shoes Because of the kidney disease limit the NSAIDs but Tylenol okay  Return as needed

## 2021-11-26 NOTE — Patient Instructions (Signed)
Go on [place] and get TULI heel cups  Ice the bottom of the heel by rolling a frozen water bottle on the bottom of the foot for 20 min 2 x a day   Do the exercises   You have some kidney disease so I d limit advil or or aleve but 500 mg of tylenol 4 x a day is ok   Wear supportive shoes

## 2022-01-12 ENCOUNTER — Ambulatory Visit (INDEPENDENT_AMBULATORY_CARE_PROVIDER_SITE_OTHER): Payer: 59 | Admitting: Family Medicine

## 2022-01-12 ENCOUNTER — Encounter: Payer: Self-pay | Admitting: Family Medicine

## 2022-01-12 DIAGNOSIS — J019 Acute sinusitis, unspecified: Secondary | ICD-10-CM | POA: Diagnosis not present

## 2022-01-12 MED ORDER — CETIRIZINE HCL 10 MG PO TABS: 10.0000 mg | ORAL_TABLET | Freq: Every day | ORAL | 0 refills | Status: DC

## 2022-01-12 MED ORDER — BENZONATATE 200 MG PO CAPS: 200.0000 mg | ORAL_CAPSULE | Freq: Three times a day (TID) | ORAL | 0 refills | Status: DC | PRN

## 2022-01-12 MED ORDER — AMOXICILLIN-POT CLAVULANATE 875-125 MG PO TABS: 1.0000 | ORAL_TABLET | Freq: Two times a day (BID) | ORAL | 0 refills | Status: DC

## 2022-01-12 NOTE — Assessment & Plan Note (Signed)
Treating with Augmentin. Zyrtec as directed.  Tessalon perles for cough.

## 2022-01-12 NOTE — Progress Notes (Signed)
Subjective:  Patient ID: Jo Hale, female    DOB: 1960/11/13  Age: 61 y.o. MRN: [id number]  CC: Chief Complaint  Patient presents with   Cough    Pt arrives due to sinus drainage, cough, ear itching and night. Pt states she can feel the drainage going down throat. Sleeps with fan at night. Mucus is cloudy/thick and today noticed a green tint.     HPI:  61 year old female presents for evaluation the above.  Patient reports ongoing congestion, postnasal drip, and cough at night.  She has had some times where her mucus was discolored.  She has been battling symptoms for 3 to 4 weeks.  No fever.  No shortness of breath.  No relief with over-the-counter treatment.  No other complaints or concerns at this time.  Patient Active Problem List   Diagnosis Date Noted   Subacute sinusitis 01/12/2022   S/P abdominal supracervical subtotal hysterectomy 09/10/2021   CKD (chronic kidney disease) stage 3, GFR 30-59 ml/min (HCC) 04/27/2021   Hyperlipidemia 04/27/2021   Chronic low back pain 04/27/2021   Knee osteoarthritis 04/27/2021   Neoplasm of right kidney 05/16/2017   History of stroke 09/18/2015   Essential hypertension, benign 01/25/2013    Social Hx   Social History   Socioeconomic History   Marital status: Married    Spouse name: Fritz Pickerel   Number of children: 2   Years of education: Not on file   Highest education level: Not on file  Occupational History   Occupation: Print production planner, Emergency planning/management officer    Employer: [employer]  Tobacco Use   Smoking status: Never   Smokeless tobacco: Never  Vaping Use   Vaping Use: Never used  Substance and Sexual Activity   Alcohol use: No   Drug use: No   Sexual activity: Not Currently    Birth control/protection: Surgical    Comment: hyst abd tubal  Other Topics Concern   Not on file  Social History Narrative   Married x 37 years in 2023.   1 son and 1 daughter   7 grandchildren.   Social Determinants of Health   Financial  Resource Strain: Low Risk  (09/10/2021)   Overall Financial Resource Strain (CARDIA)    Difficulty of Paying Living Expenses: Not hard at all  Food Insecurity: No Food Insecurity (09/10/2021)   Hunger Vital Sign    Worried About Running Out of Food in the Last Year: Never true    Ran Out of Food in the Last Year: Never true  Transportation Needs: No Transportation Needs (09/10/2021)   PRAPARE - Hydrologist (Medical): No    Lack of Transportation (Non-Medical): No  Physical Activity: Insufficiently Active (09/10/2021)   Exercise Vital Sign    Days of Exercise per Week: 1 day    Minutes of Exercise per Session: 30 min  Stress: No Stress Concern Present (09/10/2021)   [place]    Feeling of Stress : Not at all  Social Connections: [place] (09/10/2021)   Social Connection and Isolation Panel [NHANES]    Frequency of Communication with Friends and Family: More than three times a week    Frequency of Social Gatherings with Friends and Family: Three times a week    Attends Religious Services: More than 4 times per year    Active Member of Clubs or Organizations: Yes    Attends Archivist Meetings: More than 4 times per  year    Marital Status: Married    Review of Systems Per HPI  Objective:  BP 136/84   Pulse 78   Temp 98.1 F (36.7 C)   Wt 236 lb 12.8 oz (107.4 kg)   LMP 02/17/2011 Comment: partial   SpO2 98%   BMI 41.95 kg/m      01/12/2022    2:56 PM 11/26/2021   10:43 AM 10/27/2021    8:38 AM  BP/Weight  Systolic BP 622 297 989  Diastolic BP 84 71 74  Wt. (Lbs) 236.8 239 236.4  BMI 41.95 kg/m2 42.34 kg/m2 41.88 kg/m2    Physical Exam Vitals and nursing note reviewed.  Constitutional:      General: She is not in acute distress.    Appearance: Normal appearance. She is not ill-appearing.  HENT:     Head: Normocephalic and atraumatic.     Right Ear: Tympanic  membrane normal.     Left Ear: Tympanic membrane normal.     Nose: Congestion present.  Eyes:     General:        Right eye: No discharge.        Left eye: No discharge.     Conjunctiva/sclera: Conjunctivae normal.  Cardiovascular:     Rate and Rhythm: Normal rate and regular rhythm.  Pulmonary:     Effort: Pulmonary effort is normal.     Breath sounds: Normal breath sounds. No wheezing, rhonchi or rales.  Neurological:     Mental Status: She is alert.  Psychiatric:        Mood and Affect: Mood normal.        Behavior: Behavior normal.    Lab Results  Component Value Date   WBC 3.9 04/24/2021   HGB 12.9 04/24/2021   HCT 38.7 04/24/2021   PLT 244 04/24/2021   GLUCOSE 104 (H) 10/22/2021   CHOL 143 10/22/2021   TRIG 64 10/22/2021   HDL 57 10/22/2021   LDLCALC 73 10/22/2021   ALT 23 10/22/2021   AST 21 10/22/2021   NA 143 10/22/2021   K 3.6 10/22/2021   CL 106 10/22/2021   CREATININE 1.14 (H) 10/22/2021   BUN 15 10/22/2021   CO2 25 10/22/2021   TSH 1.310 01/14/2020   INR 1.07 07/02/2015   HGBA1C 5.6 09/29/2018     Assessment & Plan:   Problem List Items Addressed This Visit       Respiratory   Subacute sinusitis    Treating with Augmentin. Zyrtec as directed.  Tessalon perles for cough.       Relevant Medications   amoxicillin-clavulanate (AUGMENTIN) 875-125 MG tablet   cetirizine (ZYRTEC ALLERGY) 10 MG tablet   benzonatate (TESSALON) 200 MG capsule    Meds ordered this encounter  Medications   amoxicillin-clavulanate (AUGMENTIN) 875-125 MG tablet    Sig: Take 1 tablet by mouth 2 (two) times daily.    Dispense:  20 tablet    Refill:  0   cetirizine (ZYRTEC ALLERGY) 10 MG tablet    Sig: Take 1 tablet (10 mg total) by mouth daily.    Dispense:  30 tablet    Refill:  0   benzonatate (TESSALON) 200 MG capsule    Sig: Take 1 capsule (200 mg total) by mouth 3 (three) times daily as needed for cough.    Dispense:  30 capsule    Refill:  0     Follow-up:  As previously recommended.   [place]

## 2022-01-19 ENCOUNTER — Other Ambulatory Visit: Payer: Self-pay | Admitting: Family Medicine

## 2022-01-19 DIAGNOSIS — I10 Essential (primary) hypertension: Secondary | ICD-10-CM

## 2022-01-19 DIAGNOSIS — E785 Hyperlipidemia, unspecified: Secondary | ICD-10-CM

## 2022-02-17 ENCOUNTER — Telehealth: Payer: Self-pay | Admitting: Radiology

## 2022-02-17 NOTE — Telephone Encounter (Signed)
Patient came by the office and wants to know if Dr Aline Brochure can fill out some insurance premium waiver forms for her.  She is on disability for her knee.  He last saw her 11/2021 for another problem.  Has not seen her for knee in a while.  Please call patient and advise if we are able to fill out forms for her, and then she can bring back up here for Abigail Butts to fill out.  Thanks.

## 2022-02-18 NOTE — Telephone Encounter (Signed)
Leave in my box

## 2022-02-23 DIAGNOSIS — M47896 Other spondylosis, lumbar region: Secondary | ICD-10-CM | POA: Diagnosis not present

## 2022-03-02 DIAGNOSIS — Z85528 Personal history of other malignant neoplasm of kidney: Secondary | ICD-10-CM | POA: Diagnosis not present

## 2022-03-12 DIAGNOSIS — Z85528 Personal history of other malignant neoplasm of kidney: Secondary | ICD-10-CM | POA: Diagnosis not present

## 2022-04-29 ENCOUNTER — Ambulatory Visit (INDEPENDENT_AMBULATORY_CARE_PROVIDER_SITE_OTHER): Payer: 59 | Admitting: Family Medicine

## 2022-04-29 VITALS — BP 122/84 | HR 64 | Temp 97.3°F | Ht 63.0 in | Wt 243.0 lb

## 2022-04-29 DIAGNOSIS — I10 Essential (primary) hypertension: Secondary | ICD-10-CM | POA: Diagnosis not present

## 2022-04-29 DIAGNOSIS — M170 Bilateral primary osteoarthritis of knee: Secondary | ICD-10-CM

## 2022-04-29 DIAGNOSIS — M545 Low back pain, unspecified: Secondary | ICD-10-CM | POA: Diagnosis not present

## 2022-04-29 DIAGNOSIS — E785 Hyperlipidemia, unspecified: Secondary | ICD-10-CM

## 2022-04-29 DIAGNOSIS — G8929 Other chronic pain: Secondary | ICD-10-CM

## 2022-04-29 NOTE — Assessment & Plan Note (Signed)
Orthopedics had discussion regarding injections.  Advised patient to reconsider.

## 2022-04-29 NOTE — Progress Notes (Signed)
Subjective:   Jo Hale is a 62 y.o. female who presents for Medicare Annual (Subsequent) preventive examination.  I connected with  [name] on 04/30/22 by a audio enabled telemedicine application and verified that I am speaking with the correct person using two identifiers.  Patient Location: Home  Provider Location: Office/Clinic  I discussed the limitations of evaluation and management by telemedicine. The patient expressed understanding and agreed to proceed.  Review of Systems     Cardiac Risk Factors include: advanced age (>79mn, >>59women);dyslipidemia;hypertension;sedentary lifestyle     Objective:    Today's Vitals   04/30/22 0830  Weight: 243 lb (110.2 kg)  Height: '5\' 3"'$  (1.6 m)   Body mass index is 43.05 kg/m.     04/30/2022    8:31 AM 04/21/2021    8:44 AM 09/17/2019    9:31 AM 04/27/2018    7:29 AM 04/24/2018    2:49 PM 03/20/2018    8:39 AM 03/16/2018    9:08 AM  Advanced Directives  Does Patient Have a Medical Advance Directive? No No No No No No No  Would patient like information on creating a medical advance directive? Yes (MAU/Ambulatory/Procedural Areas - Information given) No - Patient declined No - Patient declined No - Patient declined No - Patient declined No - Patient declined No - Patient declined    Current Medications (verified) Outpatient Encounter Medications as of 04/30/2022  Medication Sig   albuterol (VENTOLIN HFA) 108 (90 Base) MCG/ACT inhaler Inhale 2 puffs into the lungs every 4 (four) hours as needed for wheezing or shortness of breath.   atorvastatin (LIPITOR) 20 MG tablet TAKE 1 TABLET BY MOUTH ONCE  DAILY   cetirizine (ZYRTEC ALLERGY) 10 MG tablet Take 1 tablet (10 mg total) by mouth daily.   diphenhydrAMINE (BENADRYL) 25 mg capsule Take 25 mg by mouth daily as needed for allergies.   Omega-3 Fatty Acids (OMEGA-3 PO) Take 2 capsules by mouth daily.    pantoprazole (PROTONIX) 40 MG tablet TAKE 1 TABLET BY MOUTH  DAILY AS  NEEDED   triamterene-hydrochlorothiazide (MAXZIDE-25) 37.5-25 MG tablet TAKE 1 TABLET BY MOUTH DAILY   No facility-administered encounter medications on file as of 04/30/2022.    Allergies (verified) Adhesive [tape]   History: Past Medical History:  Diagnosis Date   Abnormal pap    Fibroids    Fibroids, intramural    450-600 gm uterus   GERD (gastroesophageal reflux disease)    Hyperlipidemia    Hypertension    Menopause 08/27/2014   Neoplasm of kidney    PONV (postoperative nausea and vomiting)    Posterior pain of right hip 07/31/2013   Renal cancer, right ([facility] 05/2017   TIA (transient ischemic attack) 2017 or 2018 unsure   Vaginal Pap smear, abnormal    Past Surgical History:  Procedure Laterality Date   CESAREAN SECTION     COLONOSCOPY  02/19/2011   Fields-incomplete exam, otherwise normal    COLONOSCOPY N/A 03/20/2018   Procedure: COLONOSCOPY;  Surgeon: FDanie Binder MD;  Location: [facility];  Service: Endoscopy;  Laterality: N/A;  9:30   KNEE ARTHROSCOPY WITH MEDIAL MENISECTOMY Right 04/27/2018   Procedure: RIGHT KNEE ARTHROSCOPY WITH PARTIAL MEDIAL MENISECTOMY;  Surgeon: HCarole Civil MD;  Location: [facility];  Service: Orthopedics;  Laterality: Right;   LAPAROSCOPIC SUPRACERVICAL HYSTERECTOMY  03/02/2011   Procedure: LAPAROSCOPIC SUPRACERVICAL HYSTERECTOMY;  Surgeon: JJonnie Kind MD;  Location: [facility];  Service: Gynecology;  Laterality: N/A;  PARTIAL HYSTERECTOMY     ROBOT ASSISTED LAPAROSCOPIC NEPHRECTOMY Right 05/16/2017   Procedure: XI ROBOTIC ASSISTED LAPAROSCOPIC PARTIAL NEPHRECTOMY;  Surgeon: Raynelle Bring, MD;  Location: [facility];  Service: Urology;  Laterality: Right;   ROTATOR CUFF REPAIR     left   TUBAL LIGATION     Family History  Problem Relation Age of Onset   Heart disease Mother    Dementia Mother    Stroke Mother    Congestive Heart Failure Father    Cancer Father    Mental illness Brother    Early death Sister     Alcohol abuse Sister    Stroke Sister    Mental illness Brother    Mental illness Brother    Mental illness Sister    Colon cancer Neg Hx    Social History   Socioeconomic History   Marital status: Married    Spouse name: Fritz Pickerel   Number of children: 2   Years of education: Not on file   Highest education level: Not on file  Occupational History   Occupation: Print production planner, Emergency planning/management officer    Employer: [employer]  Tobacco Use   Smoking status: Never   Smokeless tobacco: Never  Vaping Use   Vaping Use: Never used  Substance and Sexual Activity   Alcohol use: No   Drug use: No   Sexual activity: Not Currently    Birth control/protection: Surgical    Comment: hyst abd tubal  Other Topics Concern   Not on file  Social History Narrative   Married x 37 years in 2023.   1 son and 1 daughter   7 grandchildren.   Social Determinants of Health   Financial Resource Strain: Low Risk  (04/30/2022)   Overall Financial Resource Strain (CARDIA)    Difficulty of Paying Living Expenses: Not hard at all  Food Insecurity: No Food Insecurity (04/30/2022)   Hunger Vital Sign    Worried About Running Out of Food in the Last Year: Never true    Ran Out of Food in the Last Year: Never true  Transportation Needs: No Transportation Needs (04/30/2022)   PRAPARE - Hydrologist (Medical): No    Lack of Transportation (Non-Medical): No  Physical Activity: Insufficiently Active (04/30/2022)   Exercise Vital Sign    Days of Exercise per Week: 1 day    Minutes of Exercise per Session: 30 min  Stress: No Stress Concern Present (04/30/2022)   [place]    Feeling of Stress : Not at all  Social Connections: [place] (04/30/2022)   Social Connection and Isolation Panel [NHANES]    Frequency of Communication with Friends and Family: More than three times a week    Frequency of Social Gatherings with  Friends and Family: Three times a week    Attends Religious Services: More than 4 times per year    Active Member of Clubs or Organizations: Yes    Attends Music therapist: More than 4 times per year    Marital Status: Married    Tobacco Counseling Counseling given: Not Answered   Clinical Intake:  Pre-visit preparation completed: Yes  Pain : No/denies pain     Diabetes: No  How often do you need to have someone help you when you read instructions, pamphlets, or other written materials from your doctor or pharmacy?: 1 - Never  Diabetic?No   Interpreter Needed?: No  Information entered by ::  Denman George LPN   Activities of Daily Living    04/30/2022    8:31 AM  In your present state of health, do you have any difficulty performing the following activities:  Hearing? 0  Vision? 0  Difficulty concentrating or making decisions? 0  Walking or climbing stairs? 0  Dressing or bathing? 0  Doing errands, shopping? 0  Preparing Food and eating ? N  Using the Toilet? N  In the past six months, have you accidently leaked urine? N  Do you have problems with loss of bowel control? N  Managing your Medications? N  Managing your Finances? N  Housekeeping or managing your Housekeeping? N    Patient Care Team: Coral Spikes, DO as PCP - General (Family Medicine) Carole Civil, MD as Consulting Physician (Orthopedic Surgery) Raynelle Bring, MD as Consulting Physician (Urology) Madelin Headings, DO (Optometry)  Indicate any recent Medical Services you may have received from other than [facility] providers in the past year (date may be approximate).     Assessment:   This is a routine wellness examination for Julieanne.  Hearing/Vision screen Hearing Screening - Comments:: Denies hearing difficulties   Vision Screening - Comments::  up to date with routine eye exams with [facility] Lin[facility] Hoff    Dietary issues and exercise activities discussed: Current  Exercise Habits: The patient does not participate in regular exercise at present   Goals Addressed   None    Depression Screen    04/30/2022    8:32 AM 04/29/2022    9:17 AM 09/10/2021    2:18 PM 04/21/2021    8:40 AM 09/11/2020    8:45 AM 08/27/2020    9:43 AM 01/29/2020   10:14 AM  PHQ 2/9 Scores  PHQ - 2 Score 0 0 0 0 0 0 0  PHQ- 9 Score  0 0   0     Fall Risk    04/30/2022    8:31 AM 04/29/2022    9:17 AM 09/10/2021    2:19 PM 04/21/2021    8:45 AM 09/11/2020    8:44 AM  [place] in the past year? 0 1 0 0 0  Number falls in past yr: 0 0 0 0 0  Injury with Fall? 0 0 0 0 0  Risk for fall due to :  History of fall(s) No Fall Risks Impaired mobility No Fall Risks  Follow up Falls prevention discussed;Education provided;Falls evaluation completed Falls evaluation completed Falls evaluation completed Falls prevention discussed Falls evaluation completed    FALL RISK PREVENTION PERTAINING TO THE HOME:  Any stairs in or around the home? Yes  If so, are there any without handrails? No  Home free of loose throw rugs in walkways, pet beds, electrical cords, etc? Yes  Adequate lighting in your home to reduce risk of falls? Yes   ASSISTIVE DEVICES UTILIZED TO PREVENT FALLS:  Life alert? No  Use of a cane, walker or w/c? No  Grab bars in the bathroom? No  Shower chair or bench in shower? No  Elevated toilet seat or a handicapped toilet? Yes   TIMED UP AND GO:  Was the test performed? No . Telephonic visit   Cognitive Function:        04/30/2022    8:31 AM 04/21/2021    8:48 AM  6CIT Screen  What Year? 0 points 0 points  What month? 0 points 0 points  What time? 0 points 0 points  Count  back from 20 0 points 0 points  Months in reverse 0 points 0 points  Repeat phrase 0 points 2 points  Total Score 0 points 2 points    Immunizations  There is no immunization history on file for this patient.  TDAP status: Due, Education has been provided regarding the  importance of this vaccine. Advised may receive this vaccine at local pharmacy or Health Dept. Aware to provide a copy of the vaccination record if obtained from local pharmacy or Health Dept. Verbalized acceptance and understanding.  Flu Vaccine status: Declined, Education has been provided regarding the importance of this vaccine but patient still declined. Advised may receive this vaccine at local pharmacy or Health Dept. Aware to provide a copy of the vaccination record if obtained from local pharmacy or Health Dept. Verbalized acceptance and understanding.  Pneumococcal vaccine status: Declined,  Education has been provided regarding the importance of this vaccine but patient still declined. Advised may receive this vaccine at local pharmacy or Health Dept. Aware to provide a copy of the vaccination record if obtained from local pharmacy or Health Dept. Verbalized acceptance and understanding.   Covid-19 vaccine status: Declined, Education has been provided regarding the importance of this vaccine but patient still declined. Advised may receive this vaccine at local pharmacy or Health Dept.or vaccine clinic. Aware to provide a copy of the vaccination record if obtained from local pharmacy or Health Dept. Verbalized acceptance and understanding.  Qualifies for Shingles Vaccine? Yes   Zostavax completed No   Shingrix Completed?: No.    Education has been provided regarding the importance of this vaccine. Patient has been advised to call insurance company to determine out of pocket expense if they have not yet received this vaccine. Advised may also receive vaccine at local pharmacy or Health Dept. Verbalized acceptance and understanding.  Screening Tests Health Maintenance  Topic Date Due   HIV Screening  Never done   Zoster Vaccines- Shingrix (1 of 2) 07/29/2022 (Originally 12/08/1979)   MAMMOGRAM  10/27/2022   Medicare Annual Wellness (AWV)  05/01/2023   PAP SMEAR-Modifier  08/28/2023    COLONOSCOPY (Pts 45-36yr Insurance coverage will need to be confirmed)  03/20/2028   Hepatitis C Screening  Completed   HPV VACCINES  Aged Out   DTaP/Tdap/Td  Discontinued   INFLUENZA VACCINE  Discontinued   COVID-19 Vaccine  Discontinued    Health Maintenance  Health Maintenance Due  Topic Date Due   HIV Screening  Never done    Colorectal cancer screening: Type of screening: Colonoscopy. Completed 03/20/18. Repeat every 10 years  Mammogram status: Completed 10/26/21. Repeat every year  Lung Cancer Screening: (Low Dose CT Chest recommended if Age 62-80years, 30 pack-year currently smoking OR have quit w/in 15years.) does not qualify.   Lung Cancer Screening Referral: n/a    Additional Screening:  Hepatitis C Screening: does qualify; Completed 06/21/16  Vision Screening: Recommended annual ophthalmology exams for early detection of glaucoma and other disorders of the eye. Is the patient up to date with their annual eye exam?  Yes  Who is the provider or what is the name of the office in which the patient attends annual eye exams? [facility] R[facility] Hoff If pt is not established with a provider, would they like to be referred to a provider to establish care? No .   Dental Screening: Recommended annual dental exams for proper oral hygiene  Community Resource Referral / Chronic Care Management: CRR required this visit?  No  CCM required this visit?  No      Plan:     I have personally reviewed and noted the following in the patient's chart:   Medical and social history Use of alcohol, tobacco or illicit drugs  Current medications and supplements including opioid prescriptions. Patient is not currently taking opioid prescriptions. Functional ability and status Nutritional status Physical activity Advanced directives List of other physicians Hospitalizations, surgeries, and ER visits in previous 12 months Vitals Screenings to include cognitive, depression, and  falls Referrals and appointments  In addition, I have reviewed and discussed with patient certain preventive protocols, quality metrics, and best practice recommendations. A written personalized care plan for preventive services as well as general preventive health recommendations were provided to patient.     Vanetta Mulders, [place]   1/00/7121   Due to this being a virtual visit, the after visit summary with patients personalized plan was offered to patient via mail or my-chart.  per request, patient was mailed a copy of AVS.  Nurse Notes: No concerns

## 2022-04-29 NOTE — Assessment & Plan Note (Signed)
Needs weight loss.  Tylenol as needed.  Follow-up with Ortho.

## 2022-04-29 NOTE — Progress Notes (Signed)
Subjective:  Patient ID: Jo Hale, female    DOB: 06/25/1960  Age: 62 y.o. MRN: [id number]  CC: Chief Complaint  Patient presents with   Hypertension   Back Pain    Notices also leg swelling left leg and left hip pain, Swelling behind left knee    HPI:  62 year old female with hypertension, knee osteoarthritis, chronic low back pain, history of renal cell carcinoma status post partial nephrectomy, CKD, hyperlipidemia, history of CVA presents for follow-up.  Hypertension is well-controlled on triamterene/HCTZ.  Patient follows with nephrology regarding history of renal cell carcinoma.  Lipids have been fairly well-controlled on Lipitor.  Last LDL 73.  Patient continues to have ongoing issues with low back pain as well as knee pain.  Follows with orthopedics.  No current NSAIDs due to elevated creatinine.  Using Tylenol at this time.   Patient Active Problem List   Diagnosis Date Noted   S/P abdominal supracervical subtotal hysterectomy 09/10/2021   CKD (chronic kidney disease) stage 3, GFR 30-59 ml/min (HCC) 04/27/2021   Hyperlipidemia 04/27/2021   Chronic low back pain 04/27/2021   Knee osteoarthritis 04/27/2021   Neoplasm of right kidney 05/16/2017   History of stroke 09/18/2015   Essential hypertension, benign 01/25/2013    Social Hx   Social History   Socioeconomic History   Marital status: Married    Spouse name: Fritz Pickerel   Number of children: 2   Years of education: Not on file   Highest education level: Not on file  Occupational History   Occupation: Print production planner, Emergency planning/management officer    Employer: [employer]  Tobacco Use   Smoking status: Never   Smokeless tobacco: Never  Vaping Use   Vaping Use: Never used  Substance and Sexual Activity   Alcohol use: No   Drug use: No   Sexual activity: Not Currently    Birth control/protection: Surgical    Comment: hyst abd tubal  Other Topics Concern   Not on file  Social History Narrative   Married x 37  years in 2023.   1 son and 1 daughter   7 grandchildren.   Social Determinants of Health   Financial Resource Strain: Low Risk  (09/10/2021)   Overall Financial Resource Strain (CARDIA)    Difficulty of Paying Living Expenses: Not hard at all  Food Insecurity: No Food Insecurity (09/10/2021)   Hunger Vital Sign    Worried About Running Out of Food in the Last Year: Never true    Ran Out of Food in the Last Year: Never true  Transportation Needs: No Transportation Needs (09/10/2021)   PRAPARE - Hydrologist (Medical): No    Lack of Transportation (Non-Medical): No  Physical Activity: Insufficiently Active (09/10/2021)   Exercise Vital Sign    Days of Exercise per Week: 1 day    Minutes of Exercise per Session: 30 min  Stress: No Stress Concern Present (09/10/2021)   [place]    Feeling of Stress : Not at all  Social Connections: [place] (09/10/2021)   Social Connection and Isolation Panel [NHANES]    Frequency of Communication with Friends and Family: More than three times a week    Frequency of Social Gatherings with Friends and Family: Three times a week    Attends Religious Services: More than 4 times per year    Active Member of Clubs or Organizations: Yes    Attends Archivist Meetings:  More than 4 times per year    Marital Status: Married    Review of Systems  Constitutional: Negative.   Musculoskeletal:  Positive for arthralgias and back pain.    Objective:  BP 122/84   Pulse 64   Temp (!) 97.3 F (36.3 C)   Ht '5\' 3"'$  (1.6 m)   Wt 243 lb (110.2 kg)   LMP 02/17/2011 Comment: partial   SpO2 99%   BMI 43.05 kg/m      04/29/2022    9:13 AM 01/12/2022    2:56 PM 11/26/2021   10:43 AM  BP/Weight  Systolic BP 264 158 309  Diastolic BP 84 84 71  Wt. (Lbs) 243 236.8 239  BMI 43.05 kg/m2 41.95 kg/m2 42.34 kg/m2    Physical Exam Vitals and nursing note  reviewed.  Constitutional:      General: She is not in acute distress.    Appearance: Normal appearance. She is obese.  HENT:     Head: Normocephalic and atraumatic.  Eyes:     General:        Right eye: No discharge.        Left eye: No discharge.     Conjunctiva/sclera: Conjunctivae normal.  Cardiovascular:     Rate and Rhythm: Normal rate and regular rhythm.  Pulmonary:     Effort: Pulmonary effort is normal.     Breath sounds: Normal breath sounds. No wheezing, rhonchi or rales.  Neurological:     Mental Status: She is alert.  Psychiatric:        Mood and Affect: Mood normal.        Behavior: Behavior normal.     Lab Results  Component Value Date   WBC 3.9 04/24/2021   HGB 12.9 04/24/2021   HCT 38.7 04/24/2021   PLT 244 04/24/2021   GLUCOSE 104 (H) 10/22/2021   CHOL 143 10/22/2021   TRIG 64 10/22/2021   HDL 57 10/22/2021   LDLCALC 73 10/22/2021   ALT 23 10/22/2021   AST 21 10/22/2021   NA 143 10/22/2021   K 3.6 10/22/2021   CL 106 10/22/2021   CREATININE 1.14 (H) 10/22/2021   BUN 15 10/22/2021   CO2 25 10/22/2021   TSH 1.310 01/14/2020   INR 1.07 07/02/2015   HGBA1C 5.6 09/29/2018     Assessment & Plan:   Problem List Items Addressed This Visit       Cardiovascular and Mediastinum   Essential hypertension, benign - Primary    Well-controlled.  Continue triamterene/HCTZ.        Musculoskeletal and Integument   Knee osteoarthritis    Orthopedics had discussion regarding injections.  Advised patient to reconsider.        Other   Chronic low back pain    Needs weight loss.  Tylenol as needed.  Follow-up with Ortho.      Hyperlipidemia    Stable.  Continue statin.      Follow-up:  Return in about 6 months (around 10/28/2022).  [place]

## 2022-04-29 NOTE — Assessment & Plan Note (Signed)
Well-controlled.  Continue triamterene/HCTZ.

## 2022-04-29 NOTE — Assessment & Plan Note (Signed)
Stable.  Continue statin. 

## 2022-04-29 NOTE — Patient Instructions (Signed)
Continue your medications.  Try Tumeric.   Follow up in 6 months.   Follow up closely with Ortho.

## 2022-04-29 NOTE — Patient Instructions (Signed)
Ms. Dressel , Thank you for taking time to come for your Medicare Wellness Visit. I appreciate your ongoing commitment to your health goals. Please review the following plan we discussed and let me know if I can assist you in the future.   These are the goals we discussed:  Goals      Exercise 3x per week (30 min per time)     Pt would like to have knee repaired so she can exercise more.         This is a list of the screening recommended for you and due dates:  Health Maintenance  Topic Date Due   HIV Screening  Never done   DTaP/Tdap/Td vaccine (1 - Tdap) Never done   Medicare Annual Wellness Visit  04/21/2022   Zoster (Shingles) Vaccine (1 of 2) 07/29/2022*   Pap Smear  08/28/2023   Mammogram  10/27/2023   Colon Cancer Screening  03/20/2028   Hepatitis C Screening: USPSTF Recommendation to screen - Ages 18-79 yo.  Completed   HPV Vaccine  Aged Out   Flu Shot  Discontinued   COVID-19 Vaccine  Discontinued  *Topic was postponed. The date shown is not the original due date.    Advanced directives: Advance directive discussed with you today. I have provided a copy for you to complete at home and have notarized. Once this is complete please bring a copy in to our office so we can scan it into your chart.   Conditions/risks identified: Aim for 30 minutes of exercise or brisk walking, 6-8 glasses of water, and 5 servings of fruits and vegetables each day.   Next appointment: Follow up in one year for your annual wellness visit.   Preventive Care 40-64 Years, Female Preventive care refers to lifestyle choices and visits with your health care provider that can promote health and wellness. What does preventive care include? A yearly physical exam. This is also called an annual well check. Dental exams once or twice a year. Routine eye exams. Ask your health care provider how often you should have your eyes checked. Personal lifestyle choices, including: Daily care of your teeth  and gums. Regular physical activity. Eating a healthy diet. Avoiding tobacco and drug use. Limiting alcohol use. Practicing safe sex. Taking low-dose aspirin daily starting at age 31. Taking vitamin and mineral supplements as recommended by your health care provider. What happens during an annual well check? The services and screenings done by your health care provider during your annual well check will depend on your age, overall health, lifestyle risk factors, and family history of disease. Counseling  Your health care provider may ask you questions about your: Alcohol use. Tobacco use. Drug use. Emotional well-being. Home and relationship well-being. Sexual activity. Eating habits. Work and work Statistician. Method of birth control. Menstrual cycle. Pregnancy history. Screening  You may have the following tests or measurements: Height, weight, and BMI. Blood pressure. Lipid and cholesterol levels. These may be checked every 5 years, or more frequently if you are over 64 years old. Skin check. Lung cancer screening. You may have this screening every year starting at age 20 if you have a 30-pack-year history of smoking and currently smoke or have quit within the past 15 years. Fecal occult blood test (FOBT) of the stool. You may have this test every year starting at age 86. Flexible sigmoidoscopy or colonoscopy. You may have a sigmoidoscopy every 5 years or a colonoscopy every 10 years starting at age 77. Hepatitis  C blood test. Hepatitis B blood test. Sexually transmitted disease (STD) testing. Diabetes screening. This is done by checking your blood sugar (glucose) after you have not eaten for a while (fasting). You may have this done every 1-3 years. Mammogram. This may be done every 1-2 years. Talk to your health care provider about when you should start having regular mammograms. This may depend on whether you have a family history of breast cancer. BRCA-related cancer  screening. This may be done if you have a family history of breast, ovarian, tubal, or peritoneal cancers. Pelvic exam and Pap test. This may be done every 3 years starting at age 74. Starting at age 63, this may be done every 5 years if you have a Pap test in combination with an HPV test. Bone density scan. This is done to screen for osteoporosis. You may have this scan if you are at high risk for osteoporosis. Discuss your test results, treatment options, and if necessary, the need for more tests with your health care provider. Vaccines  Your health care provider may recommend certain vaccines, such as: Influenza vaccine. This is recommended every year. Tetanus, diphtheria, and acellular pertussis (Tdap, Td) vaccine. You may need a Td booster every 10 years. Zoster vaccine. You may need this after age 40. Pneumococcal 13-valent conjugate (PCV13) vaccine. You may need this if you have certain conditions and were not previously vaccinated. Pneumococcal polysaccharide (PPSV23) vaccine. You may need one or two doses if you smoke cigarettes or if you have certain conditions. Talk to your health care provider about which screenings and vaccines you need and how often you need them. This information is not intended to replace advice given to you by your health care provider. Make sure you discuss any questions you have with your health care provider. Document Released: 04/25/2015 Document Revised: 12/17/2015 Document Reviewed: 01/28/2015 Elsevier Interactive Patient Education  2017 [place] Prevention in the Home Falls can cause injuries. They can happen to people of all ages. There are many things you can do to make your home safe and to help prevent falls. What can I do on the outside of my home? Regularly fix the edges of walkways and driveways and fix any cracks. Remove anything that might make you trip as you walk through a door, such as a raised step or threshold. Trim any  bushes or trees on the path to your home. Use bright outdoor lighting. Clear any walking paths of anything that might make someone trip, such as rocks or tools. Regularly check to see if handrails are loose or broken. Make sure that both sides of any steps have handrails. Any raised decks and porches should have guardrails on the edges. Have any leaves, snow, or ice cleared regularly. Use sand or salt on walking paths during winter. Clean up any spills in your garage right away. This includes oil or grease spills. What can I do in the bathroom? Use night lights. Install grab bars by the toilet and in the tub and shower. Do not use towel bars as grab bars. Use non-skid mats or decals in the tub or shower. If you need to sit down in the shower, use a plastic, non-slip stool. Keep the floor dry. Clean up any water that spills on the floor as soon as it happens. Remove soap buildup in the tub or shower regularly. Attach bath mats securely with double-sided non-slip rug tape. Do not have throw rugs and other things on  the floor that can make you trip. What can I do in the bedroom? Use night lights. Make sure that you have a light by your bed that is easy to reach. Do not use any sheets or blankets that are too big for your bed. They should not hang down onto the floor. Have a firm chair that has side arms. You can use this for support while you get dressed. Do not have throw rugs and other things on the floor that can make you trip. What can I do in the kitchen? Clean up any spills right away. Avoid walking on wet floors. Keep items that you use a lot in easy-to-reach places. If you need to reach something above you, use a strong step stool that has a grab bar. Keep electrical cords out of the way. Do not use floor polish or wax that makes floors slippery. If you must use wax, use non-skid floor wax. Do not have throw rugs and other things on the floor that can make you trip. What can I do  with my stairs? Do not leave any items on the stairs. Make sure that there are handrails on both sides of the stairs and use them. Fix handrails that are broken or loose. Make sure that handrails are as long as the stairways. Check any carpeting to make sure that it is firmly attached to the stairs. Fix any carpet that is loose or worn. Avoid having throw rugs at the top or bottom of the stairs. If you do have throw rugs, attach them to the floor with carpet tape. Make sure that you have a light switch at the top of the stairs and the bottom of the stairs. If you do not have them, ask someone to add them for you. What else can I do to help prevent falls? Wear shoes that: Do not have high heels. Have rubber bottoms. Are comfortable and fit you well. Are closed at the toe. Do not wear sandals. If you use a stepladder: Make sure that it is fully opened. Do not climb a closed stepladder. Make sure that both sides of the stepladder are locked into place. Ask someone to hold it for you, if possible. Clearly mark and make sure that you can see: Any grab bars or handrails. First and last steps. Where the edge of each step is. Use tools that help you move around (mobility aids) if they are needed. These include: Canes. Walkers. Scooters. Crutches. Turn on the lights when you go into a dark area. Replace any light bulbs as soon as they burn out. Set up your furniture so you have a clear path. Avoid moving your furniture around. If any of your floors are uneven, fix them. If there are any pets around you, be aware of where they are. Review your medicines with your doctor. Some medicines can make you feel dizzy. This can increase your chance of falling. Ask your doctor what other things that you can do to help prevent falls. This information is not intended to replace advice given to you by your health care provider. Make sure you discuss any questions you have with your health care  provider. Document Released: 01/23/2009 Document Revised: 09/04/2015 Document Reviewed: 05/03/2014 Elsevier Interactive Patient Education  2017 [employer].

## 2022-04-30 ENCOUNTER — Ambulatory Visit (INDEPENDENT_AMBULATORY_CARE_PROVIDER_SITE_OTHER): Payer: 59

## 2022-04-30 VITALS — Ht 63.0 in | Wt 243.0 lb

## 2022-04-30 DIAGNOSIS — Z0000 Encounter for general adult medical examination without abnormal findings: Secondary | ICD-10-CM | POA: Diagnosis not present

## 2022-10-28 ENCOUNTER — Ambulatory Visit (HOSPITAL_COMMUNITY)
Admission: RE | Admit: 2022-10-28 | Discharge: 2022-10-28 | Disposition: A | Discharge status: Home / Self Care | Payer: 59 | Source: Ambulatory Visit | Attending: Family Medicine | Admitting: Family Medicine

## 2022-10-28 ENCOUNTER — Encounter: Payer: Self-pay | Admitting: Family Medicine

## 2022-10-28 ENCOUNTER — Ambulatory Visit (INDEPENDENT_AMBULATORY_CARE_PROVIDER_SITE_OTHER): Payer: 59 | Admitting: Family Medicine

## 2022-10-28 ENCOUNTER — Other Ambulatory Visit (HOSPITAL_COMMUNITY): Payer: Self-pay | Admitting: Family Medicine

## 2022-10-28 VITALS — BP 134/84 | HR 67 | Temp 97.2°F | Ht 63.0 in | Wt 247.0 lb

## 2022-10-28 DIAGNOSIS — I10 Essential (primary) hypertension: Secondary | ICD-10-CM

## 2022-10-28 DIAGNOSIS — M545 Low back pain, unspecified: Secondary | ICD-10-CM

## 2022-10-28 DIAGNOSIS — M47816 Spondylosis without myelopathy or radiculopathy, lumbar region: Secondary | ICD-10-CM | POA: Diagnosis not present

## 2022-10-28 DIAGNOSIS — R1032 Left lower quadrant pain: Secondary | ICD-10-CM

## 2022-10-28 DIAGNOSIS — N1831 Chronic kidney disease, stage 3a: Secondary | ICD-10-CM

## 2022-10-28 DIAGNOSIS — G8929 Other chronic pain: Secondary | ICD-10-CM | POA: Diagnosis not present

## 2022-10-28 DIAGNOSIS — M25552 Pain in left hip: Secondary | ICD-10-CM | POA: Diagnosis not present

## 2022-10-28 DIAGNOSIS — R7309 Other abnormal glucose: Secondary | ICD-10-CM

## 2022-10-28 DIAGNOSIS — E785 Hyperlipidemia, unspecified: Secondary | ICD-10-CM | POA: Diagnosis not present

## 2022-10-28 DIAGNOSIS — M5136 Other intervertebral disc degeneration, lumbar region: Secondary | ICD-10-CM | POA: Diagnosis not present

## 2022-10-28 DIAGNOSIS — Z1231 Encounter for screening mammogram for malignant neoplasm of breast: Secondary | ICD-10-CM

## 2022-10-28 MED ORDER — TRIAMTERENE-HCTZ 37.5-25 MG PO TABS: 1.0000 | ORAL_TABLET | Freq: Every day | ORAL | 2 refills | Status: DC

## 2022-10-28 NOTE — Assessment & Plan Note (Signed)
Has been stable. Labs today.  

## 2022-10-28 NOTE — Progress Notes (Addendum)
Subjective:  Patient ID: Jo Hale, female    DOB: [birth year]  Age: 62 y.o. MRN: [id number]  CC: Chief Complaint  Patient presents with  . Hypertension    Refill medication  . Back Pain    Wants xrays , pain in left side/ wraps to back  Left groin pulled     HPI:  62 year old female with the below mentioned medical problems presents for follow-up.  Patient's hypertension is stable on triamterene/HCTZ.  She needs a refill on this medication.  Kidney disease has been stable.  Needs labs.  She has not had labs since last year.  Patient has had prior renal cell carcinoma with partial nephrectomy.  Patient has longstanding history of chronic low back pain.  She reports seems to be worsening.  Located diffusely across the low back.  She has had some radicular symptoms to the left leg.  She has been using natural herbs and supplements without significant improvement.  Patient that she was previously seen by neurosurgery and was recommended to have a pain stimulator.  She did not want to proceed with this.  She is requesting x-rays today.  Additionally, patient reports that she has had some pain in the left inguinal region.  No recent fall, trauma, injury.    Patient Active Problem List   Diagnosis Date Noted  . S/P abdominal supracervical subtotal hysterectomy 09/10/2021  . CKD (chronic kidney disease) stage 3, GFR 30-59 ml/min (HCC) 04/27/2021  . Hyperlipidemia 04/27/2021  . Chronic low back pain 04/27/2021  . Knee osteoarthritis 04/27/2021  . Neoplasm of right kidney 05/16/2017  . History of stroke 09/18/2015  . Essential hypertension, benign 01/25/2013    Social Hx   Social History   Socioeconomic History  . Marital status: Married    Spouse name: Jo Hale  . Number of children: 2  . Years of education: Not on file  . Highest education level: Not on file  Occupational History  . Occupation: Estate agent, Theatre stage manager    Employer: [employer]  Tobacco Use  .  Smoking status: Never  . Smokeless tobacco: Never  Vaping Use  . Vaping status: Never Used  Substance and Sexual Activity  . Alcohol use: No  . Drug use: No  . Sexual activity: Not Currently    Birth control/protection: Surgical    Comment: hyst abd tubal  Other Topics Concern  . Not on file  Social History Narrative   Married x 37 years in 2023.   1 son and 1 daughter   7 grandchildren.   Social Determinants of Health   Financial Resource Strain: Low Risk  (04/30/2022)   Overall Financial Resource Strain (CARDIA)   . Difficulty of Paying Living Expenses: Not hard at all  Food Insecurity: No Food Insecurity (04/30/2022)   Hunger Vital Sign   . Worried About Programme researcher, broadcasting/film/video in the Last Year: Never true   . Ran Out of Food in the Last Year: Never true  Transportation Needs: No Transportation Needs (04/30/2022)   PRAPARE - Transportation   . Lack of Transportation (Medical): No   . Lack of Transportation (Non-Medical): No  Physical Activity: Insufficiently Active (04/30/2022)   Exercise Vital Sign   . Days of Exercise per Week: 1 day   . Minutes of Exercise per Session: 30 min  Stress: No Stress Concern Present (04/30/2022)   [employer] of Occupational Health - Occupational Stress Questionnaire   . Feeling of Stress : Not at all  Social Connections:  Socially Integrated (04/30/2022)   Social Connection and Isolation Panel [NHANES]   . Frequency of Communication with Friends and Family: More than three times a week   . Frequency of Social Gatherings with Friends and Family: Three times a week   . Attends Religious Services: More than 4 times per year   . Active Member of Clubs or Organizations: Yes   . Attends Banker Meetings: More than 4 times per year   . Marital Status: Married    Review of Systems Per HPI  Objective:  BP 134/84   Pulse 67   Temp (!) 97.2 F (36.2 C)   Ht 5\' 3"  (1.6 m)   Wt 247 lb (112 kg)   LMP 02/17/2011 Comment: partial    SpO2 100%   BMI 43.75 kg/m      10/28/2022    9:32 AM 04/30/2022    8:30 AM 04/29/2022    9:13 AM  BP/Weight  Systolic BP 134 -- 122  Diastolic BP 84 -- 84  Wt. (Lbs) 247 243 243  BMI 43.75 kg/m2 43.05 kg/m2 43.05 kg/m2    Physical Exam Vitals and nursing note reviewed.  Constitutional:      General: She is not in acute distress.    Appearance: Normal appearance.  HENT:     Head: Normocephalic and atraumatic.  Eyes:     General:        Right eye: No discharge.        Left eye: No discharge.     Conjunctiva/sclera: Conjunctivae normal.  Cardiovascular:     Rate and Rhythm: Normal rate and regular rhythm.  Pulmonary:     Effort: Pulmonary effort is normal.     Breath sounds: Normal breath sounds. No wheezing, rhonchi or rales.  Neurological:     Mental Status: She is alert.  Psychiatric:        Mood and Affect: Mood normal.        Behavior: Behavior normal.   Lab Results  Component Value Date   WBC 3.9 04/24/2021   HGB 12.9 04/24/2021   HCT 38.7 04/24/2021   PLT 244 04/24/2021   GLUCOSE 104 (H) 10/22/2021   CHOL 143 10/22/2021   TRIG 64 10/22/2021   HDL 57 10/22/2021   LDLCALC 73 10/22/2021   ALT 23 10/22/2021   AST 21 10/22/2021   NA 143 10/22/2021   K 3.6 10/22/2021   CL 106 10/22/2021   CREATININE 1.14 (H) 10/22/2021   BUN 15 10/22/2021   CO2 25 10/22/2021   TSH 1.310 01/14/2020   INR 1.07 07/02/2015   HGBA1C 5.6 09/29/2018     Assessment & Plan:   Problem List Items Addressed This Visit       Cardiovascular and Mediastinum   Essential hypertension, benign - Primary    Stable.  Continue triamterene/HCTZ.  Refilled today.  Labs ordered.      Relevant Medications   triamterene-hydrochlorothiazide (MAXZIDE-25) 37.5-25 MG tablet     Genitourinary   CKD (chronic kidney disease) stage 3, GFR 30-59 ml/min (HCC)    Has been stable.  Labs today.      Relevant Orders   CBC   CMP14+EGFR   Microalbumin / creatinine urine ratio     Other    Hyperlipidemia   Relevant Medications   triamterene-hydrochlorothiazide (MAXZIDE-25) 37.5-25 MG tablet   Other Relevant Orders   Lipid panel   Chronic low back pain    X-ray was obtained today for further evaluation.  X-rays revealed degenerative changes.  Recommending referral back to neurosurgery.      Relevant Orders   DG Lumbar Spine Complete (Completed)   Other Visit Diagnoses     Elevated glucose       Relevant Orders   Hemoglobin A1c   Left inguinal pain       Relevant Orders   DG Hip Unilat W OR W/O Pelvis 2-3 Views Left (Completed)       Meds ordered this encounter  Medications  . triamterene-hydrochlorothiazide (MAXZIDE-25) 37.5-25 MG tablet    Sig: Take 1 tablet by mouth daily.    Dispense:  100 tablet    Refill:  2    Please send a replace/new response with 100-Day Supply if appropriate to maximize member benefit. Requesting 1 year supply.    Everlene Other DO [facility]

## 2022-10-28 NOTE — Assessment & Plan Note (Signed)
Stable.  Continue triamterene/HCTZ.  Refilled today.  Labs ordered.

## 2022-10-28 NOTE — Assessment & Plan Note (Signed)
X-ray was obtained today for further evaluation.  X-rays revealed degenerative changes.  Recommending referral back to neurosurgery.

## 2022-10-28 NOTE — Patient Instructions (Signed)
Labs today.  Xrays at the hospital.  Continue your medication.  Follow up to be determined after xray results.

## 2022-10-29 LAB — CBC
Hematocrit: 37.9 % (ref 34.0–46.6)
Hemoglobin: 12.6 g/dL (ref 11.1–15.9)
MCH: 30.9 pg (ref 26.6–33.0)
MCHC: 33.2 g/dL (ref 31.5–35.7)
MCV: 93 fL (ref 79–97)
Platelets: 238 10*3/uL (ref 150–450)
RBC: 4.08 x10E6/uL (ref 3.77–5.28)
RDW: 12.6 % (ref 11.7–15.4)
WBC: 4.9 10*3/uL (ref 3.4–10.8)

## 2022-10-29 LAB — LIPID PANEL
Chol/HDL Ratio: 3.3 ratio (ref 0.0–4.4)
Cholesterol, Total: 197 mg/dL (ref 100–199)
HDL: 59 mg/dL (ref >39)
LDL Chol Calc (NIH): 123 mg/dL — ABNORMAL HIGH (ref 0–99)
Triglycerides: 82 mg/dL (ref 0–149)
VLDL Cholesterol Cal: 15 mg/dL (ref 5–40)

## 2022-10-29 LAB — CMP14+EGFR
ALT: 20 IU/L (ref 0–32)
AST: 23 IU/L (ref 0–40)
Albumin: 4 g/dL (ref 3.9–4.9)
Alkaline Phosphatase: 90 IU/L (ref 44–121)
BUN/Creatinine Ratio: 15 (ref 12–28)
BUN: 16 mg/dL (ref 8–27)
Bilirubin Total: 0.2 mg/dL (ref 0.0–1.2)
CO2: 26 mmol/L (ref 20–29)
Calcium: 9.1 mg/dL (ref 8.7–10.3)
Chloride: 103 mmol/L (ref 96–106)
Creatinine, Ser: 1.1 mg/dL — ABNORMAL HIGH (ref 0.57–1.00)
Globulin, Total: 2.5 g/dL (ref 1.5–4.5)
Glucose: 95 mg/dL (ref 70–99)
Potassium: 4 mmol/L (ref 3.5–5.2)
Sodium: 141 mmol/L (ref 134–144)
Total Protein: 6.5 g/dL (ref 6.0–8.5)
eGFR: 57 mL/min/{1.73_m2} — ABNORMAL LOW (ref >59)

## 2022-10-29 LAB — MICROALBUMIN / CREATININE URINE RATIO
Creatinine, Urine: 154.7 mg/dL
Microalb/Creat Ratio: <2 mg/g creat (ref 0–29)
Microalbumin, Urine: <3 ug/mL

## 2022-10-29 LAB — HEMOGLOBIN A1C
Est. average glucose Bld gHb Est-mCnc: 123 mg/dL
Hgb A1c MFr Bld: 5.9 % — ABNORMAL HIGH (ref 4.8–5.6)

## 2022-11-01 ENCOUNTER — Other Ambulatory Visit: Payer: Self-pay

## 2022-11-01 DIAGNOSIS — E785 Hyperlipidemia, unspecified: Secondary | ICD-10-CM

## 2022-11-01 MED ORDER — ATORVASTATIN CALCIUM 20 MG PO TABS
40.0000 mg | ORAL_TABLET | Freq: Every day | ORAL | 3 refills | Status: DC
Start: 2022-11-01 — End: 2023-03-14

## 2022-11-04 ENCOUNTER — Ambulatory Visit (HOSPITAL_COMMUNITY)
Admission: RE | Admit: 2022-11-04 | Discharge: 2022-11-04 | Disposition: A | Discharge status: Home / Self Care | Payer: 59 | Source: Ambulatory Visit | Attending: Family Medicine | Admitting: Family Medicine

## 2022-11-04 ENCOUNTER — Encounter (HOSPITAL_COMMUNITY): Payer: Self-pay

## 2022-11-04 DIAGNOSIS — Z1231 Encounter for screening mammogram for malignant neoplasm of breast: Secondary | ICD-10-CM | POA: Diagnosis not present

## 2023-01-17 ENCOUNTER — Encounter: Payer: Self-pay | Admitting: Orthopedic Surgery

## 2023-01-17 ENCOUNTER — Ambulatory Visit (INDEPENDENT_AMBULATORY_CARE_PROVIDER_SITE_OTHER): Payer: 59 | Admitting: Orthopedic Surgery

## 2023-01-17 ENCOUNTER — Other Ambulatory Visit (INDEPENDENT_AMBULATORY_CARE_PROVIDER_SITE_OTHER): Payer: 59

## 2023-01-17 ENCOUNTER — Telehealth: Payer: Self-pay | Admitting: Orthopedic Surgery

## 2023-01-17 VITALS — BP 142/83 | HR 76 | Ht 62.0 in | Wt 246.0 lb

## 2023-01-17 DIAGNOSIS — M25512 Pain in left shoulder: Secondary | ICD-10-CM

## 2023-01-17 DIAGNOSIS — M25462 Effusion, left knee: Secondary | ICD-10-CM

## 2023-01-17 DIAGNOSIS — G8929 Other chronic pain: Secondary | ICD-10-CM

## 2023-01-17 DIAGNOSIS — M25812 Other specified joint disorders, left shoulder: Secondary | ICD-10-CM

## 2023-01-17 MED ORDER — METHYLPREDNISOLONE ACETATE 40 MG/ML IJ SUSP
40.0000 mg | Freq: Once | INTRAMUSCULAR | Status: AC
Start: 2023-01-17 — End: 2023-01-17
  Administered 2023-01-17: 40 mg via INTRA_ARTICULAR

## 2023-01-17 NOTE — Progress Notes (Signed)
New problem last seen August 2023 with plantar fasciitis   Chief Complaint  Patient presents with   Shoulder Pain    Left for about 2 weeks no injury history of surgery years ago     62 year old female presents with 2-week history of atraumatic onset of left shoulder pain.  She has a history of left shoulder surgery many years ago by a doctor in [place].  She has an incision over the left shoulder and a posterior portal site for arthroscopy surgery.  The pain is in the posterior aspect of the joint posterior aspect of the scapula there is minimal pain in the front of the shoulder on the side of the arm.  The pain increases with overhead activity  Examination of the left shoulder she has full range of motion and good strength she has pain at forward elevation and a positive impingement sign negative apprehension sign.  Imaging shows no evidence of arthritis  Impression impingement syndrome  Encounter Diagnoses  Name Primary?   Acute pain of left shoulder Yes   Shoulder impingement, left     Recommend subacromial injection left shoulder  Procedure note the subacromial injection shoulder left   Verbal consent was obtained to inject the  Left   Shoulder  Timeout was completed to confirm the injection site is a subacromial space of the  left  shoulder  Medication used Depo-Medrol 40 mg and lidocaine 1% 3 cc  Anesthesia was provided by ethyl chloride  The injection was performed in the left  posterior subacromial space. After pinning the skin with alcohol and anesthetized the skin with ethyl chloride the subacromial space was injected using a 20-gauge needle. There were no complications  Sterile dressing was applied.

## 2023-01-17 NOTE — Patient Instructions (Signed)

## 2023-01-17 NOTE — Telephone Encounter (Signed)
As patient was leaving she said she gets a "bubble behind her left knee" and wants to know if you can do anything for that  I didn t  know if you discussed that already since I wasn't in room or if I just need to tell her to make appointment

## 2023-01-18 ENCOUNTER — Other Ambulatory Visit: Payer: Self-pay | Admitting: Orthopedic Surgery

## 2023-01-18 NOTE — Telephone Encounter (Signed)
Ultrasound with aspiration if bakers cyst at hospital

## 2023-01-18 NOTE — Telephone Encounter (Signed)
I called her to see if she wants to go for the US aspiration of bakers cyst. She states its bilateral. I told her will be at [facility] imaging to expect a call, she voiced understanding.

## 2023-01-18 NOTE — Telephone Encounter (Signed)
Sent orders and Email to [facility] imaging

## 2023-01-25 ENCOUNTER — Encounter: Payer: Self-pay | Admitting: Adult Health

## 2023-01-25 ENCOUNTER — Other Ambulatory Visit (HOSPITAL_COMMUNITY)
Admission: RE | Admit: 2023-01-25 | Discharge: 2023-01-25 | Disposition: A | Discharge status: Home / Self Care | Payer: 59 | Source: Ambulatory Visit | Attending: Adult Health | Admitting: Adult Health

## 2023-01-25 ENCOUNTER — Ambulatory Visit: Payer: 59 | Admitting: Adult Health

## 2023-01-25 VITALS — BP 126/75 | HR 65 | Ht 62.0 in | Wt 243.5 lb

## 2023-01-25 DIAGNOSIS — R3915 Urgency of urination: Secondary | ICD-10-CM | POA: Diagnosis not present

## 2023-01-25 DIAGNOSIS — Z1211 Encounter for screening for malignant neoplasm of colon: Secondary | ICD-10-CM | POA: Insufficient documentation

## 2023-01-25 DIAGNOSIS — Z01419 Encounter for gynecological examination (general) (routine) without abnormal findings: Secondary | ICD-10-CM | POA: Insufficient documentation

## 2023-01-25 DIAGNOSIS — Z90711 Acquired absence of uterus with remaining cervical stump: Secondary | ICD-10-CM | POA: Diagnosis not present

## 2023-01-25 DIAGNOSIS — Z1331 Encounter for screening for depression: Secondary | ICD-10-CM | POA: Diagnosis not present

## 2023-01-25 LAB — HEMOCCULT GUIAC POC 1CARD (OFFICE): Fecal Occult Blood, POC: NEGATIVE

## 2023-01-25 LAB — POCT URINALYSIS DIPSTICK
Blood, UA: NEGATIVE
Glucose, UA: NEGATIVE
Leukocytes, UA: NEGATIVE
Protein, UA: NEGATIVE

## 2023-01-25 NOTE — Progress Notes (Signed)
Patient ID: Jo Hale, female   DOB: 27-Jun-1960, 62 y.o.   MRN: [id number] History of Present Illness: Jo Hale is a 62 year old black female, married sp [facility] in for a well woman gyn exam and pap. She has noticed urinary urgency and may lose urine at times. Has some night sweats and used OTC cream called Menopause, does have red clover extract in it, but does not have other ingredients. Has discomfort left groin area, has had back and hip x-rayed.   PCP is Dr Adriana Simas   Current Medications, Allergies, Past Medical History, Past Surgical History, Family History and Social History were reviewed in [facility] electronic medical record.     Review of Systems: Patient denies any headaches, hearing loss, fatigue, blurred vision, shortness of breath, chest pain, abdominal pain, problems with bowel movements, or intercourse(husband has ED has seen urologist). No joint pain or mood swings.  See HPI for positives    Physical Exam:BP 126/75 (BP Location: Left Arm, Patient Position: Sitting, Cuff Size: Large)   Pulse 65   Ht 5\' 2"  (1.575 m)   Wt 243 lb 8 oz (110.5 kg)   LMP 02/17/2011 Comment: partial   BMI 44.54 kg/m  urine dipstick was negative  General:  Well developed, well nourished, no acute distress Skin:  Warm and dry Neck:  Midline trachea, normal thyroid, good ROM, no lymphadenopathy, no carotid bruits heard Lungs; Clear to auscultation bilaterally Breast:  No dominant palpable mass, retraction, or nipple discharge Cardiovascular: Regular rate and rhythm Abdomen:  Soft, non tender, no hepatosplenomegaly Pelvic:  External genitalia is normal in appearance, no lesions.  The vagina is normal in appearance. Urethra has no lesions or masses. The cervix is bulbous.  Uterus is felt to be normal size, shape, and contour.  No adnexal masses or tenderness noted.Bladder is non tender, no masses felt. Rectal: Good sphincter tone, no polyps, or hemorrhoids felt.  Hemoccult  negative. Extremities/musculoskeletal:  No swelling or varicosities noted, no clubbing or cyanosis Psych:  No mood changes, alert and cooperative,seems happy AA is 0 Fall risk is low     01/25/2023   11:39 AM 10/28/2022   10:16 AM 04/30/2022    8:32 AM  Depression screen PHQ 2/9  Decreased Interest 0 0 0  Down, Depressed, Hopeless 0 0 0  PHQ - 2 Score 0 0 0  Altered sleeping 1 0   Tired, decreased energy 0 0   Change in appetite 0 0   Feeling bad or failure about yourself  0 0   Trouble concentrating 0 0   Moving slowly or fidgety/restless 0 0   Suicidal thoughts 0 0   PHQ-9 Score 1 0   Difficult doing work/chores  Not difficult at all        01/25/2023   11:39 AM 10/28/2022   10:17 AM 04/29/2022    9:18 AM 09/10/2021    2:19 PM  GAD 7 : Generalized Anxiety Score  Nervous, Anxious, on Edge 0 0 0 0  Control/stop worrying 0 0 0 0  Worry too much - different things 0 0 0 0  Trouble relaxing 0 0 0 0  Restless 0 0 0 0  Easily annoyed or irritable 0 0 0 0  Afraid - awful might happen 0 0 0 0  Total GAD 7 Score 0 0 0 0  Anxiety Difficulty  Not difficult at all Not difficult at all     Upstream - 01/25/23 1146  Pregnancy Intention Screening   Does the patient want to become pregnant in the next year? N/A    Does the patient's partner want to become pregnant in the next year? N/A    Would the patient like to discuss contraceptive options today? N/A      Contraception Wrap Up   Current Method Female Sterilization   [facility]   End Method Female Sterilization   [facility]   Contraception Counseling Provided No            Examination chaperoned by [place] Scearce NP student     Impression and Plan: 1. Encounter for gynecological examination with Papanicolaou smear of cervix Pap sent Pap in 3 years if normal Physical in 1 year Colonoscopy 2029 Mammogram was negative 11/04/22 Labs with PCP - Cytology - PAP( [place]) I would not use the menopause cream without seeing  ingredients   2. Encounter for screening fecal occult blood testing Hemoccult was negative  - POCT occult blood stool  3. S/P abdominal supracervical subtotal hysterectomy Pap sent - Cytology - PAP( [place])  4. Urinary urgency Decrease caffeine and go when urge hits Urine dipstick was negative  - POCT urinalysis dipstick

## 2023-01-27 LAB — CYTOLOGY - PAP
Comment: NEGATIVE
Diagnosis: NEGATIVE
High risk HPV: NEGATIVE

## 2023-01-31 ENCOUNTER — Telehealth: Payer: Self-pay | Admitting: *Deleted

## 2023-01-31 NOTE — Telephone Encounter (Signed)
Left message @ 1:35 pm, letting pt know her pap was negative for HPV and malignancy. JSY

## 2023-01-31 NOTE — Telephone Encounter (Signed)
-----   Message from [place] sent at 01/31/2023  1:23 PM EDT ----- Let her know pap was negative for HPV and malignancy THX

## 2023-03-14 ENCOUNTER — Encounter: Payer: Self-pay | Admitting: Family Medicine

## 2023-03-14 ENCOUNTER — Ambulatory Visit (INDEPENDENT_AMBULATORY_CARE_PROVIDER_SITE_OTHER): Payer: 59 | Admitting: Family Medicine

## 2023-03-14 DIAGNOSIS — G4733 Obstructive sleep apnea (adult) (pediatric): Secondary | ICD-10-CM | POA: Diagnosis not present

## 2023-03-14 DIAGNOSIS — R0683 Snoring: Secondary | ICD-10-CM

## 2023-03-14 MED ORDER — PROMETHAZINE-DM 6.25-15 MG/5ML PO SYRP: 5.0000 mL | ORAL_SOLUTION | Freq: Four times a day (QID) | ORAL | 0 refills | Status: DC | PRN

## 2023-03-14 NOTE — Progress Notes (Signed)
Subjective:  Patient ID: Jo Hale, female    DOB: [birth year]  Age: 62 y.o. MRN: [id number]  CC:   Chief Complaint  Patient presents with   right ear drainage    X1 week   scrachy throat    HPI:  62 year old female presents for evaluation of the above.  Patient reports that she has had some right ear drainage and a dry cough.  She is also had a scratchy throat when she breathes in.  Patient also states that she is most troubled by the fact that she has had dry mouth particularly in the morning.  She states that her tongue seems to stick to the bottom of her mouth.  She states that the symptoms have been going on for about a week.  Patient also reports that she has been snoring more heavily than she normally does.  No fever.  No other complaints or concerns at this time.   Patient Active Problem List   Diagnosis Date Noted   Urinary urgency 01/25/2023   S/P abdominal supracervical subtotal hysterectomy 09/10/2021   CKD (chronic kidney disease) stage 3, GFR 30-59 ml/min (HCC) 04/27/2021   Hyperlipidemia 04/27/2021   Chronic low back pain 04/27/2021   Knee osteoarthritis 04/27/2021   Neoplasm of right kidney 05/16/2017   History of stroke 09/18/2015   Essential hypertension, benign 01/25/2013    Social Hx   Social History   Socioeconomic History   Marital status: Married    Spouse name: Peyton Najjar   Number of children: 2   Years of education: Not on file   Highest education level: Not on file  Occupational History   Occupation: Estate agent, Theatre stage manager    Employer: [employer]  Tobacco Use   Smoking status: Never   Smokeless tobacco: Never  Vaping Use   Vaping status: Never Used  Substance and Sexual Activity   Alcohol use: No   Drug use: No   Sexual activity: Not Currently    Birth control/protection: Surgical    Comment: [facility]  Other Topics Concern   Not on file  Social History Narrative   Married x 37 years in 2023.   1 son and 1 daughter   7  grandchildren.   Social Determinants of Health   Financial Resource Strain: Low Risk  (01/25/2023)   Overall Financial Resource Strain (CARDIA)    Difficulty of Paying Living Expenses: Not hard at all  Food Insecurity: No Food Insecurity (01/25/2023)   Hunger Vital Sign    Worried About Running Out of Food in the Last Year: Never true    Ran Out of Food in the Last Year: Never true  Transportation Needs: No Transportation Needs (01/25/2023)   PRAPARE - Administrator, Civil Service (Medical): No    Lack of Transportation (Non-Medical): No  Physical Activity: Insufficiently Active (01/25/2023)   Exercise Vital Sign    Days of Exercise per Week: 3 days    Minutes of Exercise per Session: 30 min  Stress: No Stress Concern Present (01/25/2023)   [employer] of Occupational Health - Occupational Stress Questionnaire    Feeling of Stress : Not at all  Social Connections: Socially Integrated (01/25/2023)   Social Connection and Isolation Panel [NHANES]    Frequency of Communication with Friends and Family: More than three times a week    Frequency of Social Gatherings with Friends and Family: More than three times a week    Attends Religious Services: More than 4 times  per year    Active Member of Clubs or Organizations: Yes    Attends Banker Meetings: More than 4 times per year    Marital Status: Married    Review of Systems Per HPI  Objective:  BP 136/77   Pulse 71   Temp (!) 97.3 F (36.3 C)   Ht 5\' 2"  (1.575 m)   Wt 250 lb (113.4 kg)   LMP 02/17/2011 Comment: partial   SpO2 98%   BMI 45.73 kg/m      03/14/2023   10:03 AM 01/25/2023   11:40 AM 01/17/2023    4:11 PM  BP/Weight  Systolic BP 136 126 142  Diastolic BP 77 75 83  Wt. (Lbs) 250 243.5 246  BMI 45.73 kg/m2 44.54 kg/m2 44.99 kg/m2    Physical Exam Vitals and nursing note reviewed.  Constitutional:      Appearance: Normal appearance. She is obese.  HENT:     Head:  Normocephalic and atraumatic.     Right Ear: Tympanic membrane normal.     Left Ear: Tympanic membrane normal.     Mouth/Throat:     Mouth: Mucous membranes are moist.     Pharynx: Oropharynx is clear.  Cardiovascular:     Rate and Rhythm: Normal rate and regular rhythm.  Pulmonary:     Effort: Pulmonary effort is normal.     Breath sounds: Normal breath sounds. No wheezing or rales.  Neurological:     Mental Status: She is alert.     Lab Results  Component Value Date   WBC 4.9 10/28/2022   HGB 12.6 10/28/2022   HCT 37.9 10/28/2022   PLT 238 10/28/2022   GLUCOSE 95 10/28/2022   CHOL 197 10/28/2022   TRIG 82 10/28/2022   HDL 59 10/28/2022   LDLCALC 123 (H) 10/28/2022   ALT 20 10/28/2022   AST 23 10/28/2022   NA 141 10/28/2022   K 4.0 10/28/2022   CL 103 10/28/2022   CREATININE 1.10 (H) 10/28/2022   BUN 16 10/28/2022   CO2 26 10/28/2022   TSH 1.310 01/14/2020   INR 1.07 07/02/2015   HGBA1C 5.9 (H) 10/28/2022     Assessment & Plan:   62 year old female presents with a myriad of symptoms today. Her exam is unremarkable.  My main concern is that she is having issues with obstructive sleep apnea.  I believe that she is breathing through her mouth at night which is causing the dry mouth and throat irritation.  She has had significant snoring.  Placing referral for sleep study.  Advised to stay hydrated.  Promethazine DM for cough.  Meds ordered this encounter  Medications   promethazine-dextromethorphan (PROMETHAZINE-DM) 6.25-15 MG/5ML syrup    Sig: Take 5 mLs by mouth 4 (four) times daily as needed for cough.    Dispense:  118 mL    Refill:  0    Follow-up:  Return if symptoms worsen or fail to improve.  Everlene Other DO [facility]

## 2023-03-14 NOTE — Patient Instructions (Addendum)
Stay hydrated.  Referring for sleep study.  Medication as directed.  Take care  Dr. Adriana Simas

## 2023-03-22 DIAGNOSIS — Z85528 Personal history of other malignant neoplasm of kidney: Secondary | ICD-10-CM | POA: Diagnosis not present

## 2023-03-25 DIAGNOSIS — Z85528 Personal history of other malignant neoplasm of kidney: Secondary | ICD-10-CM | POA: Diagnosis not present

## 2023-04-11 ENCOUNTER — Ambulatory Visit: Payer: Self-pay | Admitting: Family Medicine

## 2023-04-11 NOTE — Telephone Encounter (Signed)
Copied from CRM [id number]. Topic: Clinical - Red Word Triage >> Apr 11, 2023  2:47 PM Gildardo Pounds wrote: Red Word that prompted transfer to Nurse Triage:  Patient had an appt 2 weeks ago, still coughing up greenish/yellowish mucus. wheezing at night. office visit on 03/14/2023. unsure of diagnosis. Patient is on prescribed cough medicine, promethazine-dextromethorphan (PROMETHAZINE-DM) 6.25-15 MG/5ML syrup. Patient wants something stronger called in for symptoms.  Chief Complaint  Patient had an appt 2 weeks ago, still coughing up greenish/yellowish mucus. wheezing at night. office visit on 03/14/2023. unsure of diagnosis. Patient is on prescribed cough medicine, promethazine-dextromethorphan (PROMETHAZINE-DM) 6.25-15 MG/5ML syrup. Patient wants something  called in for symptoms. What she has is not working. Symptoms:  Continuous Cough, Wheezing only at night. Feeling Fatigued Frequency:  constantly Disposition: [] ED /[] Urgent Care (no appt availability in office) / [] Appointment(In office/virtual)/ [x]  [facility]/ [] Home Care/ [] Refused Recommended Disposition /[] [place] [facility]/ []  Follow-up with PCP Additional Notes:  Patient would like to be  notified of any cancellation appointments. Reason for Disposition  [1] Continuous (nonstop) coughing interferes with work or school AND [2] no improvement using cough treatment per Care Advice  Answer Assessment - Initial Assessment Questions 1. ONSET: "When did the cough begin?"       2 weeks  ago 2. SEVERITY: "How bad is the cough today?"       Severe it makes her want to sleep and stay away from around people. She is unsure if she is contagious. Runs a homeless Shelter 3. SPUTUM: "Describe the color of your sputum" (none, dry cough; clear, white, yellow, green)      Cough is yellowish and very thick . Patient states she can't get it up because it is very thick. 4. HEMOPTYSIS: "Are you coughing up any blood?" If so ask: "How much?"  (flecks, streaks, tablespoons, etc.)     Denies. 5. DIFFICULTY BREATHING: "Are you having difficulty breathing?" If Yes, ask: "How bad is it?" (e.g., mild, moderate, severe)     Moderate   - MODERATE: SOB at rest, SOB with minimal exertion and prefers to sit, cannot lie down flat, speaks in phrases, mild retractions, audible wheezing, pulse 100-120.  6. FEVER: "Do you have a fever?" If Yes, ask: "What is your temperature, how was it measured, and when did it start?"     Denis any fever . 7. CARDIAC HISTORY: "Do you have any history of heart disease?" (e.g., heart attack, congestive heart failure)     Denies 8. LUNG HISTORY: "Do you have any history of lung disease?"  (e.g., pulmonary embolus, asthma, emphysema)      Denies. 9. PE RISK FACTORS: "Do you have a history of blood clots?" (or: recent major surgery, recent prolonged travel, bedridden)      Denies. 10. OTHER SYMPTOMS: "Do you have any other symptoms?" (e.g., runny nose, wheezing, chest pain)        Denies 11. PREGNANCY: "Is there any chance you are pregnant?" "When was your last menstrual period?"       Denies  Protocols used: Cough - Acute Productive-A-AH

## 2023-04-12 ENCOUNTER — Telehealth: Payer: Medicare Other | Admitting: Physician Assistant

## 2023-04-12 DIAGNOSIS — J208 Acute bronchitis due to other specified organisms: Secondary | ICD-10-CM

## 2023-04-12 DIAGNOSIS — B9689 Other specified bacterial agents as the cause of diseases classified elsewhere: Secondary | ICD-10-CM | POA: Diagnosis not present

## 2023-04-12 MED ORDER — ALBUTEROL SULFATE HFA 108 (90 BASE) MCG/ACT IN AERS: 1.0000 | INHALATION_SPRAY | Freq: Four times a day (QID) | RESPIRATORY_TRACT | 0 refills | Status: DC | PRN

## 2023-04-12 MED ORDER — BENZONATATE 100 MG PO CAPS: 100.0000 mg | ORAL_CAPSULE | Freq: Three times a day (TID) | ORAL | 0 refills | Status: DC | PRN

## 2023-04-12 MED ORDER — AZITHROMYCIN 250 MG PO TABS
ORAL_TABLET | ORAL | 0 refills | Status: AC
Start: 2023-04-12 — End: 2023-04-17

## 2023-04-12 MED ORDER — PROMETHAZINE-DM 6.25-15 MG/5ML PO SYRP: 5.0000 mL | ORAL_SOLUTION | Freq: Four times a day (QID) | ORAL | 0 refills | Status: DC | PRN

## 2023-04-12 MED ORDER — PREDNISONE 20 MG PO TABS
40.0000 mg | ORAL_TABLET | Freq: Every day | ORAL | 0 refills | Status: DC
Start: 2023-04-12 — End: 2023-04-29

## 2023-04-12 NOTE — Progress Notes (Signed)
 Virtual Visit Consent   Shizuko G Kabat, you are scheduled for a virtual visit with a [facility] provider today. Just as with appointments in the office, your consent must be obtained to participate. Your consent will be active for this visit and any virtual visit you may have with one of our providers in the next 365 days. If you have a MyChart account, a copy of this consent can be sent to you electronically.  As this is a virtual visit, video technology does not allow for your provider to perform a traditional examination. This may limit your provider's ability to fully assess your condition. If your provider identifies any concerns that need to be evaluated in person or the need to arrange testing (such as labs, EKG, etc.), we will make arrangements to do so. Although advances in technology are sophisticated, we cannot ensure that it will always work on either your end or our end. If the connection with a video visit is poor, the visit may have to be switched to a telephone visit. With either a video or telephone visit, we are not always able to ensure that we have a secure connection.  By engaging in this virtual visit, you consent to the provision of healthcare and authorize for your insurance to be billed (if applicable) for the services provided during this visit. Depending on your insurance coverage, you may receive a charge related to this service.  I need to obtain your verbal consent now. Are you willing to proceed with your visit today? Matthew G Creary has provided verbal consent on 04/12/2023 for a virtual visit (video or telephone). Delon CHRISTELLA Dickinson, PA-C  Date: 04/12/2023 1:00 PM  Virtual Visit via Video Note   I, Delon CHRISTELLA Dickinson, connected with  Jo Hale  ([id number], [birth year]) on 04/12/23 at 12:45 PM EST by a video-enabled telemedicine application and verified that I am speaking with the correct person using two identifiers.  Location: Patient: Virtual  Visit Location Patient: Home Provider: Virtual Visit Location Provider: Home Office   I discussed the limitations of evaluation and management by telemedicine and the availability of in person appointments. The patient expressed understanding and agreed to proceed.    History of Present Illness: Jo Hale is a 62 y.o. who identifies as a female who was assigned female at birth, and is being seen today for cough and wheezing. Seen 12/02//24 by PCP and prescribed Promethazine  DM for cough. Had felt was secondary to worsening sleep apnea. Symptoms have not improved and progressed to a more productive cough now.  HPI: Cough This is a new problem. The current episode started 1 to 4 weeks ago. The problem has been gradually worsening. The problem occurs hourly. The cough is Productive of sputum and productive of purulent sputum (thick, green and cloudy). Associated symptoms include headaches (with coughing), postnasal drip (initially, but improving), a sore throat (from coughing) and wheezing. Pertinent negatives include no chest pain, chills, ear congestion, ear pain, fever, myalgias, nasal congestion or rhinorrhea. The symptoms are aggravated by lying down. She has tried prescription cough suppressant (Promethazine  DM, Bromfed DM, Mucinex, claritin) for the symptoms. The treatment provided no relief. Her past medical history is significant for asthma (as a child). There is no history of bronchitis or pneumonia.    Problems:  Patient Active Problem List   Diagnosis Date Noted   Urinary urgency 01/25/2023   S/P abdominal supracervical subtotal hysterectomy 09/10/2021   CKD (chronic kidney disease) stage 3, GFR 30-59  ml/min (HCC) 04/27/2021   Hyperlipidemia 04/27/2021   Chronic low back pain 04/27/2021   Knee osteoarthritis 04/27/2021   Neoplasm of right kidney 05/16/2017   History of stroke 09/18/2015   Essential hypertension, benign 01/25/2013    Allergies:  Allergies  Allergen  Reactions   Adhesive [Tape] Rash   Medications:  Current Outpatient Medications:    albuterol  (VENTOLIN  HFA) 108 (90 Base) MCG/ACT inhaler, Inhale 1-2 puffs into the lungs every 6 (six) hours as needed., Disp: 8 g, Rfl: 0   azithromycin  (ZITHROMAX ) 250 MG tablet, Take 2 tablets on day 1, then 1 tablet daily on days 2 through 5, Disp: 6 tablet, Rfl: 0   benzonatate  (TESSALON ) 100 MG capsule, Take 1 capsule (100 mg total) by mouth 3 (three) times daily as needed., Disp: 30 capsule, Rfl: 0   predniSONE  (DELTASONE ) 20 MG tablet, Take 2 tablets (40 mg total) by mouth daily with breakfast., Disp: 10 tablet, Rfl: 0   promethazine -dextromethorphan (PROMETHAZINE -DM) 6.25-15 MG/5ML syrup, Take 5 mLs by mouth 4 (four) times daily as needed., Disp: 118 mL, Rfl: 0   Acetaminophen  (TYLENOL  PO), Take by mouth., Disp: , Rfl:    GARLIC PO, Take by mouth., Disp: , Rfl:    Omega-3 Fatty Acids (OMEGA-3 PO), Take 2 capsules by mouth daily. , Disp: , Rfl:    triamterene -hydrochlorothiazide  (MAXZIDE -25) 37.5-25 MG tablet, Take 1 tablet by mouth daily., Disp: 100 tablet, Rfl: 2  Observations/Objective: Patient is well-developed, well-nourished in no acute distress.  Resting comfortably at home.  Head is normocephalic, atraumatic.  No labored breathing.  Speech is clear and coherent with logical content.  Patient is alert and oriented at baseline.    Assessment and Plan: 1. Acute bacterial bronchitis (Primary) - azithromycin  (ZITHROMAX ) 250 MG tablet; Take 2 tablets on day 1, then 1 tablet daily on days 2 through 5  Dispense: 6 tablet; Refill: 0 - predniSONE  (DELTASONE ) 20 MG tablet; Take 2 tablets (40 mg total) by mouth daily with breakfast.  Dispense: 10 tablet; Refill: 0 - albuterol  (VENTOLIN  HFA) 108 (90 Base) MCG/ACT inhaler; Inhale 1-2 puffs into the lungs every 6 (six) hours as needed.  Dispense: 8 g; Refill: 0 - benzonatate  (TESSALON ) 100 MG capsule; Take 1 capsule (100 mg total) by mouth 3 (three) times  daily as needed.  Dispense: 30 capsule; Refill: 0 - promethazine -dextromethorphan (PROMETHAZINE -DM) 6.25-15 MG/5ML syrup; Take 5 mLs by mouth 4 (four) times daily as needed.  Dispense: 118 mL; Refill: 0  - Worsening over a week despite OTC medications - Will treat with Z-pack, Prednisone , Albuterol , Promethazine  DM, and tessalon  perles - Can continue Mucinex  - Push fluids.  - Rest.  - Steam and humidifier can help - Seek in person evaluation if worsening or symptoms fail to improve    Follow Up Instructions: I discussed the assessment and treatment plan with the patient. The patient was provided an opportunity to ask questions and all were answered. The patient agreed with the plan and demonstrated an understanding of the instructions.  A copy of instructions were sent to the patient via MyChart unless otherwise noted below.    The patient was advised to call back or seek an in-person evaluation if the symptoms worsen or if the condition fails to improve as anticipated.    Delon CHRISTELLA Dickinson, PA-C

## 2023-04-12 NOTE — Patient Instructions (Signed)
 Jo Hale, thank you for joining Delon CHRISTELLA Dickinson, PA-C for today's virtual visit.  While this provider is not your primary care provider (PCP), if your PCP is located in our provider database this encounter information will be shared with them immediately following your visit.   A [facility] MyChart account gives you access to today's visit and all your visits, tests, and labs performed at [facility]  click here if you don't have a [facility] MyChart account or go to [email or web address]  Consent: (Patient) Jo Hale provided verbal consent for this virtual visit at the beginning of the encounter.  Current Medications:  Current Outpatient Medications:    albuterol  (VENTOLIN  HFA) 108 (90 Base) MCG/ACT inhaler, Inhale 1-2 puffs into the lungs every 6 (six) hours as needed., Disp: 8 g, Rfl: 0   azithromycin  (ZITHROMAX ) 250 MG tablet, Take 2 tablets on day 1, then 1 tablet daily on days 2 through 5, Disp: 6 tablet, Rfl: 0   benzonatate  (TESSALON ) 100 MG capsule, Take 1 capsule (100 mg total) by mouth 3 (three) times daily as needed., Disp: 30 capsule, Rfl: 0   predniSONE  (DELTASONE ) 20 MG tablet, Take 2 tablets (40 mg total) by mouth daily with breakfast., Disp: 10 tablet, Rfl: 0   promethazine -dextromethorphan (PROMETHAZINE -DM) 6.25-15 MG/5ML syrup, Take 5 mLs by mouth 4 (four) times daily as needed., Disp: 118 mL, Rfl: 0   Acetaminophen  (TYLENOL  PO), Take by mouth., Disp: , Rfl:    GARLIC PO, Take by mouth., Disp: , Rfl:    Omega-3 Fatty Acids (OMEGA-3 PO), Take 2 capsules by mouth daily. , Disp: , Rfl:    triamterene -hydrochlorothiazide  (MAXZIDE -25) 37.5-25 MG tablet, Take 1 tablet by mouth daily., Disp: 100 tablet, Rfl: 2   Medications ordered in this encounter:  Meds ordered this encounter  Medications   azithromycin  (ZITHROMAX ) 250 MG tablet    Sig: Take 2 tablets on day 1, then 1 tablet daily on days 2 through 5    Dispense:  6 tablet     Refill:  0    Supervising Provider:   LAMPTEY, PHILIP O [[id number]]   predniSONE  (DELTASONE ) 20 MG tablet    Sig: Take 2 tablets (40 mg total) by mouth daily with breakfast.    Dispense:  10 tablet    Refill:  0    Supervising Provider:   LAMPTEY, PHILIP O [[id number]]   albuterol  (VENTOLIN  HFA) 108 (90 Base) MCG/ACT inhaler    Sig: Inhale 1-2 puffs into the lungs every 6 (six) hours as needed.    Dispense:  8 g    Refill:  0    Supervising Provider:   BLAISE ALEENE KIDD [[id number]]   benzonatate  (TESSALON ) 100 MG capsule    Sig: Take 1 capsule (100 mg total) by mouth 3 (three) times daily as needed.    Dispense:  30 capsule    Refill:  0    Supervising Provider:   LAMPTEY, PHILIP O [[id number]]   promethazine -dextromethorphan (PROMETHAZINE -DM) 6.25-15 MG/5ML syrup    Sig: Take 5 mLs by mouth 4 (four) times daily as needed.    Dispense:  118 mL    Refill:  0    Supervising Provider:   BLAISE ALEENE KIDD [[id number]]     *If you need refills on other medications prior to your next appointment, please contact your pharmacy*  Follow-Up: Call back or seek an in-person evaluation if the symptoms worsen or if the condition fails to improve as anticipated.  [facility] [phone]  Other Instructions Acute Bronchitis, Adult  Acute bronchitis is sudden inflammation of the main airways (bronchi) that come off the windpipe (trachea) in the lungs. The swelling causes the airways to get smaller and make more mucus than normal. This can make it hard to breathe and can cause coughing or noisy breathing (wheezing). Acute bronchitis may last several weeks. The cough may last longer. Allergies, asthma, and exposure to smoke may make the condition worse. What are the causes? This condition can be caused by germs and by substances that irritate the lungs, including: Cold and flu viruses. The most common cause of this condition is the virus that causes the common cold. Bacteria. This is less  common. Breathing in substances that irritate the lungs, including: Smoke from cigarettes and other forms of tobacco. Dust and pollen. Fumes from household cleaning products, gases, or burned fuel. Indoor or outdoor air pollution. What increases the risk? The following factors may make you more likely to develop this condition: A weak body's defense system, also called the immune system. A condition that affects your lungs and breathing, such as asthma. What are the signs or symptoms? Common symptoms of this condition include: Coughing. This may bring up clear, yellow, or green mucus from your lungs (sputum). Wheezing. Runny or stuffy nose. Having too much mucus in your lungs (chest congestion). Shortness of breath. Aches and pains, including sore throat or chest. How is this diagnosed? This condition is usually diagnosed based on: Your symptoms and medical history. A physical exam. You may also have other tests, including tests to rule out other conditions, such as pneumonia. These tests include: A test of lung function. Test of a mucus sample to look for the presence of bacteria. Tests to check the oxygen  level in your blood. Blood tests. Chest X-ray. How is this treated? Most cases of acute bronchitis clear up over time without treatment. Your health care provider may recommend: Drinking more fluids to help thin your mucus so it is easier to cough up. Taking inhaled medicine (inhaler) to improve air flow in and out of your lungs. Using a vaporizer or a humidifier. These are machines that add water  to the air to help you breathe better. Taking a medicine that thins mucus and clears congestion (expectorant). Taking a medicine that prevents or stops coughing (cough suppressant). It is not common to take an antibiotic medicine for this condition. Follow these instructions at home:  Take over-the-counter and prescription medicines only as told by your health care provider. Use an  inhaler, vaporizer, or humidifier as told by your health care provider. Take two teaspoons (10 mL) of honey at bedtime to lessen coughing at night. Drink enough fluid to keep your urine pale yellow. Do not use any products that contain nicotine or tobacco. These products include cigarettes, chewing tobacco, and vaping devices, such as e-cigarettes. If you need help quitting, ask your health care provider. Get plenty of rest. Return to your normal activities as told by your health care provider. Ask your health care provider what activities are safe for you. Keep all follow-up visits. This is important. How is this prevented? To lower your risk of getting this condition again: Wash your hands often with soap and water  for at least 20 seconds. If soap and water  are not available, use hand sanitizer. Avoid contact with people who have cold symptoms. Try not to touch your mouth, nose, or eyes with your hands. Avoid breathing in smoke or chemical  fumes. Breathing smoke or chemical fumes will make your condition worse. Get the flu shot every year. Contact a health care provider if: Your symptoms do not improve after 2 weeks. You have trouble coughing up the mucus. Your cough keeps you awake at night. You have a fever. Get help right away if you: Cough up blood. Feel pain in your chest. Have severe shortness of breath. Faint or keep feeling like you are going to faint. Have a severe headache. Have a fever or chills that get worse. These symptoms may represent a serious problem that is an emergency. Do not wait to see if the symptoms will go away. Get medical help right away. Call your local emergency services (911 in the [place] Do not drive yourself to the hospital. Summary Acute bronchitis is inflammation of the main airways (bronchi) that come off the windpipe (trachea) in the lungs. The swelling causes the airways to get smaller and make more mucus than normal. Drinking more fluids can help  thin your mucus so it is easier to cough up. Take over-the-counter and prescription medicines only as told by your health care provider. Do not use any products that contain nicotine or tobacco. These products include cigarettes, chewing tobacco, and vaping devices, such as e-cigarettes. If you need help quitting, ask your health care provider. Contact a health care provider if your symptoms do not improve after 2 weeks. This information is not intended to replace advice given to you by your health care provider. Make sure you discuss any questions you have with your health care provider. Document Revised: 07/09/2021 Document Reviewed: 07/30/2020 Elsevier Patient Education  2024 Elsevier Inc.    If you have been instructed to have an in-person evaluation today at a local Urgent Care facility, please use the link below. It will take you to a list of all of our available [facility], including address, phone number and hours of operation. Please do not delay care.  [facility]  If you or a family member do not have a primary care provider, use the link below to schedule a visit and establish care. When you choose a [facility] primary care physician or advanced practice provider, you gain a long-term partner in health. Find a Primary Care Provider  Learn more about [facility]'s in-office and virtual care options: [facility] - Get Care Now

## 2023-04-21 ENCOUNTER — Telehealth: Payer: Self-pay | Admitting: Orthopedic Surgery

## 2023-04-21 NOTE — Telephone Encounter (Signed)
 Dr. Areatha pt - spoke w/the patient, she stated that she is having rt knee pain and feels like it has fluid/pockets on the back of her knee.  She stated that she discussed with Dr. Margrette at there 01/2023 visit and that he was going to refer her to [facility], but she never heard anything.  She wants to know what Dr. Margrette would like for her to do, schedule here or refer her to [facility].  [phone]

## 2023-04-21 NOTE — Telephone Encounter (Signed)
 Returned the pt's call, lvm.  She is wanting an appointment for her rt knee, she thinks fluid is on it and she can't hardly bend it.

## 2023-04-27 NOTE — Progress Notes (Signed)
  Injection knee Right

## 2023-04-29 ENCOUNTER — Other Ambulatory Visit (INDEPENDENT_AMBULATORY_CARE_PROVIDER_SITE_OTHER): Payer: 59

## 2023-04-29 ENCOUNTER — Encounter: Payer: Self-pay | Admitting: Orthopedic Surgery

## 2023-04-29 ENCOUNTER — Ambulatory Visit (INDEPENDENT_AMBULATORY_CARE_PROVIDER_SITE_OTHER): Payer: Medicare Other | Admitting: Orthopedic Surgery

## 2023-04-29 VITALS — BP 163/106 | HR 83 | Ht 62.0 in | Wt 240.0 lb

## 2023-04-29 DIAGNOSIS — M25561 Pain in right knee: Secondary | ICD-10-CM

## 2023-04-29 DIAGNOSIS — G8929 Other chronic pain: Secondary | ICD-10-CM

## 2023-04-29 MED ORDER — MELOXICAM 7.5 MG PO TABS: 7.5000 mg | ORAL_TABLET | Freq: Every day | ORAL | 5 refills | Status: DC

## 2023-04-29 MED ORDER — METHYLPREDNISOLONE ACETATE 40 MG/ML IJ SUSP: 40.0000 mg | Freq: Once | INTRAMUSCULAR | Status: DC

## 2023-04-29 MED ORDER — METHYLPREDNISOLONE ACETATE 40 MG/ML IJ SUSP
40.0000 mg | Freq: Once | INTRAMUSCULAR | Status: AC
  Administered 2023-04-29: 40 mg via INTRA_ARTICULAR

## 2023-04-29 NOTE — Progress Notes (Signed)
  Subjective:     Patient ID: Jo Hale, female   DOB: 1960-08-05, 63 y.o.   MRN: [id number]  This is a 63 year old female status post arthroscopy right knee in 2020 comes in complaining of right knee pain swelling and giving way.  She also has some left knee swelling especially in the popliteal fossa.  We had discussed in prior visits possible aspiration with ultrasound guidance  She is not having any pain in the left knee the swelling is intermittent  She also reports some history of lumbar disc disease     Review of Systems  Constitutional:  Negative for fever.  Respiratory:  Negative for shortness of breath.   Cardiovascular:  Negative for chest pain.  Skin: Negative.        Objective:   Physical Exam Musculoskeletal:     Comments: Right knee full extension flexion arc is 125 degrees good manual muscle testing against resistance with the extensor mechanism and no tenderness in the quadriceps tendon or patella tendon.  Mild medial joint line tenderness no effusion.  Meniscal signs negative.          Assessment:     DG Knee AP/LAT W/Sunrise Right Result Date: 04/29/2023 Right knee images. Right knee pain  left knee included on the AP.  X-ray shows the knee is still in valgus alignment there is mild degenerative changes with normal patellofemoral alignment some edge loading on the lateral side Mild OA right knee normal alignment   63 year old female probably has similar findings in the right knee as we found on the left knee imaging including MRI her x-ray does not show a significant amount of arthritis and some of her giving way may be from her lumbar problems  Recommend that she start some anti-inflammatories and get an injection in the right knee    Plan:     Return in 3 months  Meds ordered this encounter  Medications   methylPREDNISolone acetate (DEPO-MEDROL) injection 40 mg   methylPREDNISolone acetate (DEPO-MEDROL) injection 40 mg   meloxicam (MOBIC) 7.5  MG tablet    Sig: Take 1 tablet (7.5 mg total) by mouth daily.    Dispense:  30 tablet    Refill:  5   Procedure note right knee injection   verbal consent was obtained to inject right knee joint  Timeout was completed to confirm the site of injection  The medications used were depomedrol 40 mg and 1% lidocaine 3 cc Anesthesia was provided by ethyl chloride and the skin was prepped with alcohol.  After cleaning the skin with alcohol a 20-gauge needle was used to inject the right knee joint. There were no complications. A sterile bandage was applied.

## 2023-04-29 NOTE — Patient Instructions (Signed)
You have received an injection of steroids into the joint. 15% of patients will have increased pain within the 24 hours postinjection.   This is transient and will go away.   We recommend that you use ice packs on the injection site for 20 minutes every 2 hours and extra strength Tylenol 2 tablets every 8 as needed until the pain resolves.  If you continue to have pain after taking the Tylenol and using the ice please call the office for further instructions.  

## 2023-05-17 ENCOUNTER — Ambulatory Visit (INDEPENDENT_AMBULATORY_CARE_PROVIDER_SITE_OTHER): Payer: 59 | Admitting: Physician Assistant

## 2023-05-17 ENCOUNTER — Encounter: Payer: Self-pay | Admitting: Physician Assistant

## 2023-05-17 VITALS — BP 142/85 | HR 77 | Temp 97.0°F | Ht 62.0 in | Wt 251.0 lb

## 2023-05-17 DIAGNOSIS — J208 Acute bronchitis due to other specified organisms: Secondary | ICD-10-CM

## 2023-05-17 MED ORDER — PREDNISONE 20 MG PO TABS: 40.0000 mg | ORAL_TABLET | Freq: Every day | ORAL | 0 refills | Status: AC

## 2023-05-17 MED ORDER — ALBUTEROL SULFATE HFA 108 (90 BASE) MCG/ACT IN AERS: 1.0000 | INHALATION_SPRAY | Freq: Four times a day (QID) | RESPIRATORY_TRACT | 0 refills | Status: DC | PRN

## 2023-05-17 MED ORDER — PROMETHAZINE-DM 6.25-15 MG/5ML PO SYRP: 5.0000 mL | ORAL_SOLUTION | Freq: Four times a day (QID) | ORAL | 0 refills | Status: DC | PRN

## 2023-05-17 NOTE — Progress Notes (Addendum)
 Acute Office Visit  Subjective:     Patient ID: Jo Hale, female    DOB: 01/06/1961, 63 y.o.   MRN: [id number]   HPI Patient is in today for cough: Patient complains of productive cough.  Symptoms began 2 months ago.  The cough is productive of green/yellow sputum, with wheezing, waxing and waning over time and is aggravated by nothing Associated symptoms include:wheezing. Patient do not have new pets. Patient do not have a history of asthma. Patient does not have a history of environmental allergens. Patient does not have recent travel. Patient does not have a history of smoking. Patient  does not have previous Chest X-ray.   Patient with recent virtual visit for similar symptoms in 03/2023.   Review of Systems  Constitutional:  Negative for chills and fever.  HENT:  Negative for congestion, ear pain and sore throat.   Respiratory:  Positive for cough. Negative for shortness of breath.   Cardiovascular:  Negative for chest pain and palpitations.        Objective:     BP (!) 142/85   Pulse 77   Temp (!) 97 F (36.1 C)   Ht 5' 2 (1.575 m)   Wt 251 lb (113.9 kg)   LMP 02/17/2011 Comment: partial   BMI 45.91 kg/m   Physical Exam Vitals reviewed.  Constitutional:      General: She is not in acute distress.    Appearance: Normal appearance.  HENT:     Nose: Nose normal.     Mouth/Throat:     Mouth: Mucous membranes are moist.     Pharynx: Oropharynx is clear.  Eyes:     Extraocular Movements: Extraocular movements intact.     Conjunctiva/sclera: Conjunctivae normal.  Cardiovascular:     Rate and Rhythm: Normal rate and regular rhythm.     Heart sounds: No murmur heard.    No friction rub. No gallop.  Pulmonary:     Effort: Pulmonary effort is normal.     Breath sounds: Normal breath sounds. No stridor. No wheezing, rhonchi or rales.  Musculoskeletal:        General: Normal range of motion.  Skin:    General: Skin is warm and dry.     Capillary  Refill: Capillary refill takes less than 2 seconds.  Neurological:     General: No focal deficit present.     Mental Status: She is alert and oriented to person, place, and time.  Psychiatric:        Mood and Affect: Mood normal.        Behavior: Behavior normal.     No results found for any visits on 05/17/23.      Assessment & Plan:  Viral bronchitis -     Albuterol  Sulfate HFA; Inhale 1-2 puffs into the lungs every 6 (six) hours as needed.  Dispense: 8 g; Refill: 0 -     Promethazine -DM; Take 5 mLs by mouth 4 (four) times daily as needed.  Dispense: 118 mL; Refill: 0 -     predniSONE ; Take 2 tablets (40 mg total) by mouth daily for 5 days.  Dispense: 10 tablet; Refill: 0   Patient appears stable today. Benign exam, lungs clear to auscultation bilaterally, no indication for chest XR at this time. Likely self-resolving viral infection. Supportive care reviewed with patient. Discussed with patient that there are no indications for antibiotics at this time, and viral respiratory illness can be persistent in duration. Prednisone  x 5 days,  promethazine  DM, and albuterol  inhaler for cough. Tylenol  or ibuprofen  for pain as needed. May continue with OTC cold medications, Mucinex samples given today. Patient instructed to return to clinic if worsening shortness of breath, chest pain, hypoxia, or other concerns. Patient agreeable to plan.    Return if symptoms worsen or fail to improve.  Charmaine Alexsandria Kivett, PA-C

## 2023-06-09 ENCOUNTER — Ambulatory Visit (HOSPITAL_COMMUNITY)
Admission: RE | Admit: 2023-06-09 | Discharge: 2023-06-09 | Disposition: A | Discharge status: Home / Self Care | Payer: 59 | Source: Ambulatory Visit | Attending: Family Medicine | Admitting: Family Medicine

## 2023-06-09 ENCOUNTER — Other Ambulatory Visit: Payer: Self-pay | Admitting: Family Medicine

## 2023-06-09 ENCOUNTER — Telehealth: Payer: Self-pay | Admitting: Family Medicine

## 2023-06-09 ENCOUNTER — Encounter: Payer: Self-pay | Admitting: Family Medicine

## 2023-06-09 ENCOUNTER — Ambulatory Visit (INDEPENDENT_AMBULATORY_CARE_PROVIDER_SITE_OTHER): Payer: 59 | Admitting: Family Medicine

## 2023-06-09 VITALS — BP 134/75 | HR 83 | Temp 99.0°F | Ht 62.0 in | Wt 248.0 lb

## 2023-06-09 DIAGNOSIS — R052 Subacute cough: Secondary | ICD-10-CM | POA: Diagnosis not present

## 2023-06-09 DIAGNOSIS — J209 Acute bronchitis, unspecified: Secondary | ICD-10-CM | POA: Insufficient documentation

## 2023-06-09 DIAGNOSIS — R053 Chronic cough: Secondary | ICD-10-CM | POA: Diagnosis not present

## 2023-06-09 MED ORDER — AMOXICILLIN-POT CLAVULANATE 875-125 MG PO TABS: 1.0000 | ORAL_TABLET | Freq: Two times a day (BID) | ORAL | 0 refills | Status: DC

## 2023-06-09 MED ORDER — HYDROCODONE BIT-HOMATROP MBR 5-1.5 MG/5ML PO SOLN
5.0000 mL | Freq: Three times a day (TID) | ORAL | 0 refills | Status: DC | PRN
Start: 2023-06-09 — End: 2023-06-09

## 2023-06-09 NOTE — Telephone Encounter (Signed)
 Copied from CRM [id number]. Topic: Clinical - Prescription Issue >> Jun 09, 2023 10:45 AM Thomes Dinning wrote: Reason for CRM: Melony from [facility] called to advise the following medication: HYDROcodone bit-homatropine (HYCODAN) 5-1.5 MG/5ML syrup is on back order and they are unsure when they will be back in stock. Pharmacy is asking that an alternate medication be sent in.

## 2023-06-09 NOTE — Assessment & Plan Note (Signed)
 X-ray today for further evaluation.  Hycodan for cough.  Placing on Augmentin given lack of improvement.

## 2023-06-09 NOTE — Progress Notes (Signed)
 Subjective:  Patient ID: Jo Hale, female    DOB: 03/24/1961  Age: 63 y.o. MRN: [id number]  CC:   Chief Complaint  Patient presents with   Cough    Dry cough about 3 weeks , chest is sore had chills and was shaky     HPI:  62 year old female presents for evaluation of the above.  3-week history of symptoms.  Reports persistent dry cough.  Has had some chills.  No documented fever.  Has been seen recently on 2/4 and has had no significant improvement.  Continues to feel poorly.  Associated chest discomfort from all of the cough.  Patient concerned about bronchitis.  Patient Active Problem List   Diagnosis Date Noted   Acute bronchitis 06/09/2023   S/P abdominal supracervical subtotal hysterectomy 09/10/2021   CKD (chronic kidney disease) stage 3, GFR 30-59 ml/min (HCC) 04/27/2021   Hyperlipidemia 04/27/2021   Chronic low back pain 04/27/2021   Knee osteoarthritis 04/27/2021   Neoplasm of right kidney 05/16/2017   History of stroke 09/18/2015   Essential hypertension, benign 01/25/2013    Social Hx   Social History   Socioeconomic History   Marital status: Married    Spouse name: Peyton Najjar   Number of children: 2   Years of education: Not on file   Highest education level: Not on file  Occupational History   Occupation: Estate agent, Theatre stage manager    Employer: [employer]  Tobacco Use   Smoking status: Never   Smokeless tobacco: Never  Vaping Use   Vaping status: Never Used  Substance and Sexual Activity   Alcohol use: No   Drug use: No   Sexual activity: Not Currently    Birth control/protection: Surgical    Comment: [facility]  Other Topics Concern   Not on file  Social History Narrative   Married x 37 years in 2023.   1 son and 1 daughter   7 grandchildren.   Social Drivers of Corporate investment banker Strain: Low Risk  (01/25/2023)   Overall Financial Resource Strain (CARDIA)    Difficulty of Paying Living Expenses: Not hard at all  Food  Insecurity: No Food Insecurity (01/25/2023)   Hunger Vital Sign    Worried About Running Out of Food in the Last Year: Never true    Ran Out of Food in the Last Year: Never true  Transportation Needs: No Transportation Needs (01/25/2023)   PRAPARE - Administrator, Civil Service (Medical): No    Lack of Transportation (Non-Medical): No  Physical Activity: Insufficiently Active (01/25/2023)   Exercise Vital Sign    Days of Exercise per Week: 3 days    Minutes of Exercise per Session: 30 min  Stress: No Stress Concern Present (01/25/2023)   [employer] of Occupational Health - Occupational Stress Questionnaire    Feeling of Stress : Not at all  Social Connections: Socially Integrated (01/25/2023)   Social Connection and Isolation Panel [NHANES]    Frequency of Communication with Friends and Family: More than three times a week    Frequency of Social Gatherings with Friends and Family: More than three times a week    Attends Religious Services: More than 4 times per year    Active Member of [employer] or Organizations: Yes    Attends Engineer, structural: More than 4 times per year    Marital Status: Married    Review of Systems Per HPI  Objective:  BP 134/75   Pulse  83   Temp 99 F (37.2 C) (Oral)   Ht 5\' 2"  (1.575 m)   Wt 248 lb (112.5 kg)   LMP 02/17/2011 Comment: partial   SpO2 97%   BMI 45.36 kg/m      06/09/2023   10:23 AM 05/17/2023   11:20 AM 04/29/2023   11:25 AM  BP/Weight  Systolic BP 134 142 163  Diastolic BP 75 85 106  Wt. (Lbs) 248 251 240  BMI 45.36 kg/m2 45.91 kg/m2 43.9 kg/m2    Physical Exam Vitals and nursing note reviewed.  Constitutional:      General: She is not in acute distress.    Appearance: Normal appearance. She is obese.  HENT:     Head: Normocephalic and atraumatic.  Cardiovascular:     Rate and Rhythm: Normal rate and regular rhythm.  Pulmonary:     Effort: Pulmonary effort is normal.     Breath sounds: Normal  breath sounds. No wheezing or rales.  Neurological:     Mental Status: She is alert.  Psychiatric:        Mood and Affect: Mood normal.        Behavior: Behavior normal.     Lab Results  Component Value Date   WBC 4.9 10/28/2022   HGB 12.6 10/28/2022   HCT 37.9 10/28/2022   PLT 238 10/28/2022   GLUCOSE 95 10/28/2022   CHOL 197 10/28/2022   TRIG 82 10/28/2022   HDL 59 10/28/2022   LDLCALC 123 (H) 10/28/2022   ALT 20 10/28/2022   AST 23 10/28/2022   NA 141 10/28/2022   K 4.0 10/28/2022   CL 103 10/28/2022   CREATININE 1.10 (H) 10/28/2022   BUN 16 10/28/2022   CO2 26 10/28/2022   TSH 1.310 01/14/2020   INR 1.07 07/02/2015   HGBA1C 5.9 (H) 10/28/2022     Assessment & Plan:  Acute bronchitis, unspecified organism Assessment & Plan: X-ray today for further evaluation.  Hycodan for cough.  Placing on Augmentin given lack of improvement.   Subacute cough -     DG Chest 2 View  Other orders -     HYDROcodone Bit-Homatrop MBr; Take 5 mLs by mouth every 8 (eight) hours as needed for cough.  Dispense: 120 mL; Refill: 0 -     Amoxicillin-Pot Clavulanate; Take 1 tablet by mouth 2 (two) times daily.  Dispense: 14 tablet; Refill: 0    Follow-up:  Return if symptoms worsen or fail to improve.  Everlene Other DO [facility]

## 2023-06-10 ENCOUNTER — Other Ambulatory Visit: Payer: Self-pay | Admitting: Nurse Practitioner

## 2023-06-10 ENCOUNTER — Telehealth: Payer: Self-pay | Admitting: Family Medicine

## 2023-06-10 MED ORDER — PROMETHAZINE-DM 6.25-15 MG/5ML PO SYRP: 5.0000 mL | ORAL_SOLUTION | Freq: Four times a day (QID) | ORAL | 0 refills | Status: DC | PRN

## 2023-06-10 NOTE — Telephone Encounter (Signed)
 Copied from CRM [id number]. Topic: Clinical - Prescription Issue >> Jun 10, 2023  2:57 PM Gery Pray wrote: Reason for CRM: Patient called in stating that the pharmacy is currently out of the [facility] would like to know if there is an alternative or if the prescription can be sent to another pharmacy such as [facility]. Patient would also like to speak with someone results from Chest Xray Scan. Patient can be contacted at [phone] Prescription can be sent to  [facility] [address], [place], [place] [address]

## 2023-07-08 ENCOUNTER — Other Ambulatory Visit: Payer: Self-pay | Admitting: Family Medicine

## 2023-07-08 MED ORDER — BENZONATATE 200 MG PO CAPS: 200.0000 mg | ORAL_CAPSULE | Freq: Three times a day (TID) | ORAL | 0 refills | Status: DC | PRN

## 2023-07-08 NOTE — Telephone Encounter (Unsigned)
 Copied from CRM [id number]. Topic: Clinical - Medication Question >> Jul 08, 2023 10:31 AM Gildardo Pounds wrote: Reason for CRM: Patient is still coughing even with the medication she was prescribed. Patient wants something else prescribed because she still has the cough. Callback number is [phone]

## 2023-07-08 NOTE — Telephone Encounter (Signed)
 Left message to inform pt

## 2023-07-20 ENCOUNTER — Ambulatory Visit (INDEPENDENT_AMBULATORY_CARE_PROVIDER_SITE_OTHER): Admitting: Family Medicine

## 2023-07-20 ENCOUNTER — Encounter: Payer: Self-pay | Admitting: Family Medicine

## 2023-07-20 VITALS — BP 152/84 | HR 66 | Temp 98.0°F | Ht 62.0 in | Wt 252.0 lb

## 2023-07-20 DIAGNOSIS — R053 Chronic cough: Secondary | ICD-10-CM

## 2023-07-20 MED ORDER — PREDNISONE 10 MG PO TABS: ORAL_TABLET | ORAL | 0 refills | Status: DC

## 2023-07-20 MED ORDER — OMEPRAZOLE 40 MG PO CPDR: 40.0000 mg | DELAYED_RELEASE_CAPSULE | Freq: Every day | ORAL | 3 refills | Status: DC

## 2023-07-20 MED ORDER — CHLORPHENIRAMINE MALEATE 4 MG PO TABS: 4.0000 mg | ORAL_TABLET | Freq: Every day | ORAL | 0 refills | Status: DC

## 2023-07-20 NOTE — Patient Instructions (Signed)
 Medications as prescribed.  If cough continues to persist, recommend referral to Pulmonology.

## 2023-07-21 DIAGNOSIS — R053 Chronic cough: Secondary | ICD-10-CM | POA: Insufficient documentation

## 2023-07-21 NOTE — Progress Notes (Signed)
 Subjective:  Patient ID: Jo Hale, female    DOB: 22-Dec-1960  Age: 63 y.o. MRN: [id number]  CC:   Chief Complaint  Patient presents with   Cough    Cough, congestion and wheezing at night for months.    Emesis    From coughing so much     HPI:  63 year old female presents with persistent cough.  This has been an ongoing issue since December.  Has been treated with antibiotic therapy and has had several medications for cough.  Continues to have cough which is dry.  Worse at night.  She reports some associated wheezing.  She has had posttussive emesis as well.  She states that it is improved but is still problematic.  Patient Active Problem List   Diagnosis Date Noted   Chronic cough 07/21/2023   S/P abdominal supracervical subtotal hysterectomy 09/10/2021   CKD (chronic kidney disease) stage 3, GFR 30-59 ml/min (HCC) 04/27/2021   Hyperlipidemia 04/27/2021   Chronic low back pain 04/27/2021   Knee osteoarthritis 04/27/2021   Neoplasm of right kidney 05/16/2017   History of stroke 09/18/2015   Essential hypertension, benign 01/25/2013    Social Hx   Social History   Socioeconomic History   Marital status: Married    Spouse name: Peyton Najjar   Number of children: 2   Years of education: Not on file   Highest education level: Not on file  Occupational History   Occupation: Estate agent, Theatre stage manager    Employer: [employer]  Tobacco Use   Smoking status: Never   Smokeless tobacco: Never  Vaping Use   Vaping status: Never Used  Substance and Sexual Activity   Alcohol use: No   Drug use: No   Sexual activity: Not Currently    Birth control/protection: Surgical    Comment: [facility]  Other Topics Concern   Not on file  Social History Narrative   Married x 37 years in 2023.   1 son and 1 daughter   7 grandchildren.   Social Drivers of Corporate investment banker Strain: Low Risk  (01/25/2023)   Overall Financial Resource Strain (CARDIA)    Difficulty of Paying  Living Expenses: Not hard at all  Food Insecurity: No Food Insecurity (01/25/2023)   Hunger Vital Sign    Worried About Running Out of Food in the Last Year: Never true    Ran Out of Food in the Last Year: Never true  Transportation Needs: No Transportation Needs (01/25/2023)   PRAPARE - Administrator, Civil Service (Medical): No    Lack of Transportation (Non-Medical): No  Physical Activity: Insufficiently Active (01/25/2023)   Exercise Vital Sign    Days of Exercise per Week: 3 days    Minutes of Exercise per Session: 30 min  Stress: No Stress Concern Present (01/25/2023)   [employer] of Occupational Health - Occupational Stress Questionnaire    Feeling of Stress : Not at all  Social Connections: Socially Integrated (01/25/2023)   Social Connection and Isolation Panel [NHANES]    Frequency of Communication with Friends and Family: More than three times a week    Frequency of Social Gatherings with Friends and Family: More than three times a week    Attends Religious Services: More than 4 times per year    Active Member of [employer] or Organizations: Yes    Attends Engineer, structural: More than 4 times per year    Marital Status: Married    Review  of Systems Per HPI  Objective:  BP (!) 152/84   Pulse 66   Temp 98 F (36.7 C)   Ht 5\' 2"  (1.575 m)   Wt 252 lb (114.3 kg)   LMP 02/17/2011 Comment: partial   SpO2 99%   BMI 46.09 kg/m      07/20/2023    3:03 PM 06/09/2023   10:23 AM 05/17/2023   11:20 AM  BP/Weight  Systolic BP 152 134 142  Diastolic BP 84 75 85  Wt. (Lbs) 252 248 251  BMI 46.09 kg/m2 45.36 kg/m2 45.91 kg/m2    Physical Exam Vitals and nursing note reviewed.  Constitutional:      Appearance: Normal appearance. She is obese.  HENT:     Head: Normocephalic and atraumatic.  Cardiovascular:     Rate and Rhythm: Normal rate and regular rhythm.  Pulmonary:     Effort: Pulmonary effort is normal.     Breath sounds: Normal breath  sounds. No wheezing, rhonchi or rales.  Neurological:     Mental Status: She is alert.     Lab Results  Component Value Date   WBC 4.9 10/28/2022   HGB 12.6 10/28/2022   HCT 37.9 10/28/2022   PLT 238 10/28/2022   GLUCOSE 95 10/28/2022   CHOL 197 10/28/2022   TRIG 82 10/28/2022   HDL 59 10/28/2022   LDLCALC 123 (H) 10/28/2022   ALT 20 10/28/2022   AST 23 10/28/2022   NA 141 10/28/2022   K 4.0 10/28/2022   CL 103 10/28/2022   CREATININE 1.10 (H) 10/28/2022   BUN 16 10/28/2022   CO2 26 10/28/2022   TSH 1.310 01/14/2020   INR 1.07 07/02/2015   HGBA1C 5.9 (H) 10/28/2022     Assessment & Plan:  Chronic cough Assessment & Plan: Likely postinfectious.  Placing on prednisone.  Placing on chlorpheniramine for possible component of postnasal drip.  Also covering for GERD with omeprazole.  If continues to persist will refer to pulmonology.  Orders: -     predniSONE; 50 mg daily x 2 days, then 40 mg daily x 2 days, then 30 mg daily x 2 days, then 20 mg daily x 2 days, then 10 mg daily x 2 days.  Dispense: 30 tablet; Refill: 0 -     Omeprazole; Take 1 capsule (40 mg total) by mouth daily.  Dispense: 30 capsule; Refill: 3 -     Chlorpheniramine Maleate; Take 1 tablet (4 mg total) by mouth at bedtime.  Dispense: 14 tablet; Refill: 0    Follow-up:  Return if symptoms worsen or fail to improve.  Everlene Other DO [facility]

## 2023-07-21 NOTE — Assessment & Plan Note (Signed)
 Likely postinfectious.  Placing on prednisone.  Placing on chlorpheniramine for possible component of postnasal drip.  Also covering for GERD with omeprazole.  If continues to persist will refer to pulmonology.

## 2023-07-28 ENCOUNTER — Ambulatory Visit: Payer: 59 | Admitting: Orthopedic Surgery

## 2023-09-01 ENCOUNTER — Ambulatory Visit: Admitting: Family Medicine

## 2023-09-06 ENCOUNTER — Ambulatory Visit (INDEPENDENT_AMBULATORY_CARE_PROVIDER_SITE_OTHER): Admitting: Family Medicine

## 2023-09-06 ENCOUNTER — Ambulatory Visit: Admitting: Family Medicine

## 2023-09-06 VITALS — BP 142/80 | HR 69 | Temp 97.3°F | Wt 253.0 lb

## 2023-09-06 DIAGNOSIS — R053 Chronic cough: Secondary | ICD-10-CM | POA: Diagnosis not present

## 2023-09-06 DIAGNOSIS — R062 Wheezing: Secondary | ICD-10-CM | POA: Diagnosis not present

## 2023-09-06 MED ORDER — ALBUTEROL SULFATE HFA 108 (90 BASE) MCG/ACT IN AERS: 2.0000 | INHALATION_SPRAY | Freq: Four times a day (QID) | RESPIRATORY_TRACT | 0 refills | Status: DC | PRN

## 2023-09-06 NOTE — Assessment & Plan Note (Signed)
 Persistent. Albuterol  as directed. Referring to pulmonology.

## 2023-09-06 NOTE — Patient Instructions (Signed)
Medication as directed.  Referral placed. 

## 2023-09-06 NOTE — Progress Notes (Signed)
 Subjective:  Patient ID: Jo Hale, female    DOB: Apr 07, 1961  Age: 63 y.o. MRN: [id number]  CC:   Chief Complaint  Patient presents with   persistent wheezing     Mostly at night     HPI:  63 year old female presents with persistent wheezing.  Worse at night. Chronic. Has been treated previously for bronchitis with Albuterol , cough suppressants, corticosteroids, and antibiotics. No significant SOB. No PND or orthopnea. No fever. Has had ongoing issues with cough as well.  Patient Active Problem List   Diagnosis Date Noted   Wheezing 09/06/2023   Chronic cough 07/21/2023   S/P abdominal supracervical subtotal hysterectomy 09/10/2021   CKD (chronic kidney disease) stage 3, GFR 30-59 ml/min (HCC) 04/27/2021   Hyperlipidemia 04/27/2021   Chronic low back pain 04/27/2021   Knee osteoarthritis 04/27/2021   Neoplasm of right kidney 05/16/2017   History of stroke 09/18/2015   Essential hypertension, benign 01/25/2013    Social Hx   Social History   Socioeconomic History   Marital status: Married    Spouse name: Hewitt Lou   Number of children: 2   Years of education: Not on file   Highest education level: Not on file  Occupational History   Occupation: Estate agent, Theatre stage manager    Employer: [employer]  Tobacco Use   Smoking status: Never   Smokeless tobacco: Never  Vaping Use   Vaping status: Never Used  Substance and Sexual Activity   Alcohol use: No   Drug use: No   Sexual activity: Not Currently    Birth control/protection: Surgical    Comment: [facility]  Other Topics Concern   Not on file  Social History Narrative   Married x 37 years in 2023.   1 son and 1 daughter   7 grandchildren.   Social Drivers of Corporate investment banker Strain: Low Risk  (01/25/2023)   Overall Financial Resource Strain (CARDIA)    Difficulty of Paying Living Expenses: Not hard at all  Food Insecurity: No Food Insecurity (01/25/2023)   Hunger Vital Sign    Worried About  Running Out of Food in the Last Year: Never true    Ran Out of Food in the Last Year: Never true  Transportation Needs: No Transportation Needs (01/25/2023)   PRAPARE - Administrator, Civil Service (Medical): No    Lack of Transportation (Non-Medical): No  Physical Activity: Insufficiently Active (01/25/2023)   Exercise Vital Sign    Days of Exercise per Week: 3 days    Minutes of Exercise per Session: 30 min  Stress: No Stress Concern Present (01/25/2023)   [employer] of Occupational Health - Occupational Stress Questionnaire    Feeling of Stress : Not at all  Social Connections: Socially Integrated (01/25/2023)   Social Connection and Isolation Panel [NHANES]    Frequency of Communication with Friends and Family: More than three times a week    Frequency of Social Gatherings with Friends and Family: More than three times a week    Attends Religious Services: More than 4 times per year    Active Member of [employer] or Organizations: Yes    Attends Engineer, structural: More than 4 times per year    Marital Status: Married    Review of Systems Per HPI  Objective:  BP (!) 142/80   Pulse 69   Temp (!) 97.3 F (36.3 C)   Wt 253 lb (114.8 kg)   LMP 02/17/2011  Comment: partial   SpO2 99%   BMI 46.27 kg/m      09/06/2023    4:01 PM 07/20/2023    3:03 PM 06/09/2023   10:23 AM  BP/Weight  Systolic BP 142 152 134  Diastolic BP 80 84 75  Wt. (Lbs) 253 252 248  BMI 46.27 kg/m2 46.09 kg/m2 45.36 kg/m2    Physical Exam Vitals and nursing note reviewed.  Constitutional:      General: She is not in acute distress.    Appearance: Normal appearance. She is obese.  HENT:     Head: Normocephalic and atraumatic.  Cardiovascular:     Rate and Rhythm: Normal rate and regular rhythm.  Pulmonary:     Effort: Pulmonary effort is normal.     Breath sounds: Normal breath sounds. No wheezing or rales.  Neurological:     Mental Status: She is alert.     Lab  Results  Component Value Date   WBC 4.9 10/28/2022   HGB 12.6 10/28/2022   HCT 37.9 10/28/2022   PLT 238 10/28/2022   GLUCOSE 95 10/28/2022   CHOL 197 10/28/2022   TRIG 82 10/28/2022   HDL 59 10/28/2022   LDLCALC 123 (H) 10/28/2022   ALT 20 10/28/2022   AST 23 10/28/2022   NA 141 10/28/2022   K 4.0 10/28/2022   CL 103 10/28/2022   CREATININE 1.10 (H) 10/28/2022   BUN 16 10/28/2022   CO2 26 10/28/2022   TSH 1.310 01/14/2020   INR 1.07 07/02/2015   HGBA1C 5.9 (H) 10/28/2022     Assessment & Plan:  Wheezing Assessment & Plan: Persistent. Albuterol  as directed. Referring to pulmonology.  Orders: -     Pulmonary Visit -     Albuterol  Sulfate HFA; Inhale 2 puffs into the lungs every 6 (six) hours as needed for wheezing or shortness of breath.  Dispense: 8 g; Refill: 0  Chronic cough -     Pulmonary Visit    Kathleen Papa DO [facility]

## 2023-09-26 ENCOUNTER — Telehealth: Payer: Self-pay | Admitting: *Deleted

## 2023-09-26 NOTE — Telephone Encounter (Unsigned)
 Copied from CRM [id number]. Topic: Clinical - Request for Lab/Test Order >> Sep 26, 2023 12:52 PM Jo Hale wrote: Reason for CRM: Pt scheduled physical for 10/27/23, pt inquiring if she should complete lab work before physical or if she has to wait until appt.   Please advise  Best call back number: [phone]

## 2023-09-27 NOTE — Telephone Encounter (Signed)
 Cook, Jayce G, DO      09/26/23  8:43 PM Please order CBC, CMP, Lipid, A1c, Urine Microalbumin.

## 2023-09-28 ENCOUNTER — Other Ambulatory Visit: Payer: Self-pay

## 2023-09-28 DIAGNOSIS — R7309 Other abnormal glucose: Secondary | ICD-10-CM

## 2023-09-28 DIAGNOSIS — E785 Hyperlipidemia, unspecified: Secondary | ICD-10-CM

## 2023-09-28 DIAGNOSIS — I10 Essential (primary) hypertension: Secondary | ICD-10-CM

## 2023-09-28 DIAGNOSIS — Z79899 Other long term (current) drug therapy: Secondary | ICD-10-CM

## 2023-09-28 NOTE — Telephone Encounter (Signed)
 Called pt to inform labs have been ordered to have those done at convenience sometime before physical appt. Please fast for labs. No answer

## 2023-10-05 DIAGNOSIS — I10 Essential (primary) hypertension: Secondary | ICD-10-CM | POA: Diagnosis not present

## 2023-10-05 DIAGNOSIS — E785 Hyperlipidemia, unspecified: Secondary | ICD-10-CM | POA: Diagnosis not present

## 2023-10-05 DIAGNOSIS — Z79899 Other long term (current) drug therapy: Secondary | ICD-10-CM | POA: Diagnosis not present

## 2023-10-05 DIAGNOSIS — R7309 Other abnormal glucose: Secondary | ICD-10-CM | POA: Diagnosis not present

## 2023-10-06 LAB — CMP14+EGFR
ALT: 30 IU/L (ref 0–32)
AST: 26 IU/L (ref 0–40)
Albumin: 4 g/dL (ref 3.9–4.9)
Alkaline Phosphatase: 100 IU/L (ref 44–121)
BUN/Creatinine Ratio: 11 — ABNORMAL LOW (ref 12–28)
BUN: 13 mg/dL (ref 8–27)
Bilirubin Total: 0.3 mg/dL (ref 0.0–1.2)
CO2: 22 mmol/L (ref 20–29)
Calcium: 8.9 mg/dL (ref 8.7–10.3)
Chloride: 104 mmol/L (ref 96–106)
Creatinine, Ser: 1.18 mg/dL — ABNORMAL HIGH (ref 0.57–1.00)
Globulin, Total: 2.3 g/dL (ref 1.5–4.5)
Glucose: 107 mg/dL — ABNORMAL HIGH (ref 70–99)
Potassium: 3.7 mmol/L (ref 3.5–5.2)
Sodium: 141 mmol/L (ref 134–144)
Total Protein: 6.3 g/dL (ref 6.0–8.5)
eGFR: 52 mL/min/{1.73_m2} — ABNORMAL LOW (ref >59)

## 2023-10-06 LAB — CBC WITH DIFFERENTIAL/PLATELET
Basophils Absolute: 0 10*3/uL (ref 0.0–0.2)
Basos: 1 %
EOS (ABSOLUTE): 0.2 10*3/uL (ref 0.0–0.4)
Eos: 5 %
Hematocrit: 39.6 % (ref 34.0–46.6)
Hemoglobin: 12.9 g/dL (ref 11.1–15.9)
Immature Grans (Abs): 0 10*3/uL (ref 0.0–0.1)
Immature Granulocytes: 0 %
Lymphocytes Absolute: 1.7 10*3/uL (ref 0.7–3.1)
Lymphs: 38 %
MCH: 31.5 pg (ref 26.6–33.0)
MCHC: 32.6 g/dL (ref 31.5–35.7)
MCV: 97 fL (ref 79–97)
Monocytes Absolute: 0.5 10*3/uL (ref 0.1–0.9)
Monocytes: 11 %
Neutrophils Absolute: 2 10*3/uL (ref 1.4–7.0)
Neutrophils: 45 %
Platelets: 238 10*3/uL (ref 150–450)
RBC: 4.1 x10E6/uL (ref 3.77–5.28)
RDW: 12.6 % (ref 11.7–15.4)
WBC: 4.4 10*3/uL (ref 3.4–10.8)

## 2023-10-06 LAB — HEMOGLOBIN A1C
Est. average glucose Bld gHb Est-mCnc: 117 mg/dL
Hgb A1c MFr Bld: 5.7 % — ABNORMAL HIGH (ref 4.8–5.6)

## 2023-10-06 LAB — LIPID PANEL
Chol/HDL Ratio: 4.1 ratio (ref 0.0–4.4)
Cholesterol, Total: 197 mg/dL (ref 100–199)
HDL: 48 mg/dL (ref >39)
LDL Chol Calc (NIH): 126 mg/dL — ABNORMAL HIGH (ref 0–99)
Triglycerides: 131 mg/dL (ref 0–149)
VLDL Cholesterol Cal: 23 mg/dL (ref 5–40)

## 2023-10-06 LAB — MICROALBUMIN / CREATININE URINE RATIO
Creatinine, Urine: 197.3 mg/dL
Microalb/Creat Ratio: 2 mg/g{creat} (ref 0–29)
Microalbumin, Urine: 4.6 ug/mL

## 2023-10-09 ENCOUNTER — Ambulatory Visit: Payer: Self-pay | Admitting: Family Medicine

## 2023-10-20 ENCOUNTER — Other Ambulatory Visit: Payer: Self-pay | Admitting: Family Medicine

## 2023-10-20 DIAGNOSIS — I10 Essential (primary) hypertension: Secondary | ICD-10-CM

## 2023-10-27 ENCOUNTER — Ambulatory Visit: Admitting: Family Medicine

## 2023-10-27 ENCOUNTER — Encounter: Payer: Self-pay | Admitting: Family Medicine

## 2023-10-27 VITALS — BP 121/78 | HR 72 | Temp 98.0°F | Ht 62.0 in | Wt 247.0 lb

## 2023-10-27 DIAGNOSIS — Z0001 Encounter for general adult medical examination with abnormal findings: Secondary | ICD-10-CM | POA: Diagnosis not present

## 2023-10-27 DIAGNOSIS — E785 Hyperlipidemia, unspecified: Secondary | ICD-10-CM | POA: Diagnosis not present

## 2023-10-27 DIAGNOSIS — Z0000 Encounter for general adult medical examination without abnormal findings: Secondary | ICD-10-CM | POA: Insufficient documentation

## 2023-10-27 DIAGNOSIS — Z8673 Personal history of transient ischemic attack (TIA), and cerebral infarction without residual deficits: Secondary | ICD-10-CM | POA: Diagnosis not present

## 2023-10-27 MED ORDER — ASPIRIN 81 MG PO TBEC: 81.0000 mg | DELAYED_RELEASE_TABLET | Freq: Every day | ORAL | 3 refills | Status: AC

## 2023-10-27 MED ORDER — PRAVASTATIN SODIUM 20 MG PO TABS: 20.0000 mg | ORAL_TABLET | Freq: Every day | ORAL | 3 refills | Status: DC

## 2023-10-27 NOTE — Patient Instructions (Signed)
 Follow up in 6 months.  Medication sent in.  Take care  Dr. Bluford

## 2023-10-27 NOTE — Assessment & Plan Note (Signed)
Aspirin sent in

## 2023-10-27 NOTE — Assessment & Plan Note (Signed)
 Not at goal. Discussed treatment options today - trying another statin, nexletol, repatha. Patient elected to try another statin. Sending in Pravastatin .

## 2023-10-27 NOTE — Progress Notes (Addendum)
 Subjective:  Patient ID: Jo Hale, female    DOB: 07-02-1960  Age: 63 y.o. MRN: [id number]  CC:   Chief Complaint  Patient presents with   Annual Exam    HPI:  63 year old female with the below mentioned medical problems presents for an annual exam.  Preventative Healthcare Pap smear: Up to date. Last done on 01/25/23. Mammogram: Done 10/2022. Patient to schedule mammogram. Colonoscopy: Up to date. Immunizations Tetanus - Advised that she can get this at her pharmacy. Pneumococcal - Due; she declines.  Flu - Not yet available this year. Shingles - Advised that she can get this at her pharmacy. Hepatitis C screening - Completed.  Labs: Recent labs done and reviewed.  Alcohol use: No. Smoking/tobacco use: No. STD/HIV testing: Discussed HIV screening. Regular dental exams: Yes.   BP stable. Is not on daily aspirin . Given prior stroke, I recommend this. Lipids not at goal. Intolerant of prior statin due to cramps.Will discuss treatment options today.  Patient Active Problem List   Diagnosis Date Noted   Annual physical exam 10/27/2023   Chronic cough 07/21/2023   S/P abdominal supracervical subtotal hysterectomy 09/10/2021   CKD (chronic kidney disease) stage 3, GFR 30-59 ml/min (HCC) 04/27/2021   Hyperlipidemia 04/27/2021   Chronic low back pain 04/27/2021   Knee osteoarthritis 04/27/2021   Neoplasm of right kidney 05/16/2017   History of stroke 09/18/2015   Essential hypertension, benign 01/25/2013    Social Hx   Social History   Socioeconomic History   Marital status: Married    Spouse name: Ubaldo   Number of children: 2   Years of education: Not on file   Highest education level: Not on file  Occupational History   Occupation: Estate agent, Theatre stage manager    Employer: [employer]  Tobacco Use   Smoking status: Never   Smokeless tobacco: Never  Vaping Use   Vaping status: Never Used  Substance and Sexual Activity   Alcohol use: No   Drug use:  No   Sexual activity: Not Currently    Birth control/protection: Surgical    Comment: [facility]  Other Topics Concern   Not on file  Social History Narrative   Married x 37 years in 2023.   1 son and 1 daughter   7 grandchildren.   Social Drivers of Corporate investment banker Strain: Low Risk  (01/25/2023)   Overall Financial Resource Strain (CARDIA)    Difficulty of Paying Living Expenses: Not hard at all  Food Insecurity: No Food Insecurity (01/25/2023)   Hunger Vital Sign    Worried About Running Out of Food in the Last Year: Never true    Ran Out of Food in the Last Year: Never true  Transportation Needs: No Transportation Needs (01/25/2023)   PRAPARE - Administrator, Civil Service (Medical): No    Lack of Transportation (Non-Medical): No  Physical Activity: Insufficiently Active (01/25/2023)   Exercise Vital Sign    Days of Exercise per Week: 3 days    Minutes of Exercise per Session: 30 min  Stress: No Stress Concern Present (01/25/2023)   [employer] of Occupational Health - Occupational Stress Questionnaire    Feeling of Stress : Not at all  Social Connections: Socially Integrated (01/25/2023)   Social Connection and Isolation Panel    Frequency of Communication with Friends and Family: More than three times a week    Frequency of Social Gatherings with Friends and Family: More than three  times a week    Attends Religious Services: More than 4 times per year    Active Member of Clubs or Organizations: Yes    Attends Banker Meetings: More than 4 times per year    Marital Status: Married    Review of Systems Per HPI  Objective:  BP 121/78   Pulse 72   Temp 98 F (36.7 C)   Ht 5' 2 (1.575 m)   Wt 247 lb (112 kg)   LMP 02/17/2011 Comment: partial   SpO2 98%   BMI 45.18 kg/m      10/27/2023    9:11 AM 09/06/2023    4:01 PM 07/20/2023    3:03 PM  BP/Weight  Systolic BP 121 142 152  Diastolic BP 78 80 84  Wt. (Lbs) 247 253 252   BMI 45.18 kg/m2 46.27 kg/m2 46.09 kg/m2    Physical Exam Vitals and nursing note reviewed.  Constitutional:      General: She is not in acute distress.    Appearance: Normal appearance. She is obese.  HENT:     Head: Normocephalic and atraumatic.  Eyes:     General:        Right eye: No discharge.        Left eye: No discharge.     Conjunctiva/sclera: Conjunctivae normal.  Cardiovascular:     Rate and Rhythm: Normal rate and regular rhythm.  Pulmonary:     Effort: Pulmonary effort is normal.     Breath sounds: Normal breath sounds. No wheezing, rhonchi or rales.  Neurological:     Mental Status: She is alert.  Psychiatric:        Mood and Affect: Mood normal.        Behavior: Behavior normal.     Lab Results  Component Value Date   WBC 4.4 10/05/2023   HGB 12.9 10/05/2023   HCT 39.6 10/05/2023   PLT 238 10/05/2023   GLUCOSE 107 (H) 10/05/2023   CHOL 197 10/05/2023   TRIG 131 10/05/2023   HDL 48 10/05/2023   LDLCALC 126 (H) 10/05/2023   ALT 30 10/05/2023   AST 26 10/05/2023   NA 141 10/05/2023   K 3.7 10/05/2023   CL 104 10/05/2023   CREATININE 1.18 (H) 10/05/2023   BUN 13 10/05/2023   CO2 22 10/05/2023   TSH 1.310 01/14/2020   INR 1.07 07/02/2015   HGBA1C 5.7 (H) 10/05/2023     Assessment & Plan:  Annual physical exam Assessment & Plan: Aspirin  sent in. Trying Pravastatin . Preventative health care discussed and updated in the EMR.   History of stroke Assessment & Plan: Aspirin  sent in.  Orders: -     Aspirin ; Take 1 tablet (81 mg total) by mouth daily. Swallow whole.  Dispense: 90 tablet; Refill: 3  Hyperlipidemia, unspecified hyperlipidemia type Assessment & Plan: Not at goal. Discussed treatment options today - trying another statin, nexletol, repatha. Patient elected to try another statin. Sending in Pravastatin .  Orders: -     Pravastatin  Sodium; Take 1 tablet (20 mg total) by mouth daily.  Dispense: 90 tablet; Refill: 3    Follow-up:   6 months  Jarmon Javid Bluford DO [facility]

## 2023-10-27 NOTE — Assessment & Plan Note (Signed)
 Aspirin  sent in. Trying Pravastatin . Preventative health care discussed and updated in the EMR.

## 2023-10-31 ENCOUNTER — Other Ambulatory Visit (HOSPITAL_COMMUNITY): Payer: Self-pay | Admitting: Family Medicine

## 2023-10-31 DIAGNOSIS — Z1231 Encounter for screening mammogram for malignant neoplasm of breast: Secondary | ICD-10-CM

## 2023-11-03 NOTE — Progress Notes (Unsigned)
 Jo Hale, female    DOB: January 31, 1961    MRN: [id number]   Brief patient profile:  39  yobf   never smoker was told asthma as child but does not remember using inhalers anything but nasal sprays   referred to pulmonary clinic in [place]  11/04/2023 by Dr Bluford  for wheezing  since winter 2025   Pt not previously seen by [facility] service.    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 to date/ no chemo or RT  Referred to ENT 09/2020 per EPIC but no ENT notes they wanted to operate   History of Present Illness  11/04/2023  Pulmonary/ 1st office eval/ Ericberto Padget / [facility] just on albuterol  prn but not much better (note very poor baseline hfa)  Chief Complaint  Patient presents with   Establish Care  Dyspnea:  Not limited by breathing from desired activities   Cough: more of a throat clearing than cough and non-productive   Sleep: on side helps / two pillows still wheezes most nights  SABA use: only once a week  02: none  Does have overt HB as well     No other obvious day to day or daytime patterns/variability or assoc excess/ purulent sputum or mucus plugs or hemoptysis or cp or chest tightness,    Also denies any obvious fluctuation of symptoms with weather or environmental changes or other aggravating or alleviating factors except as outlined above   No unusual exposure hx or h/o childhood pna or knowledge of premature birth.  Current Allergies, Complete Past Medical History, Past Surgical History, Family History, and Social History were reviewed in [employer] record.  ROS  The following are not active complaints unless bolded Hoarseness, sore throat/ mild globus sensation, dysphagia, dental problems, itching, sneezing,  nasal congestion or discharge of excess mucus or purulent secretions, ear ache,   fever, chills, sweats, unintended wt loss or wt gain, classically pleuritic or exertional cp,  orthopnea pnd or arm/hand swelling  or leg swelling,  presyncope, palpitations, abdominal pain, anorexia, nausea, vomiting, diarrhea  or change in bowel habits or change in bladder habits, change in stools or change in urine, dysuria, hematuria,  rash, arthralgias/back pain , visual complaints, headache, numbness, weakness or ataxia or problems with walking or coordination,  change in mood or  memory.            Outpatient Medications Prior to Visit  Medication Sig Dispense Refill   Acetaminophen  (TYLENOL  PO) Take by mouth.     albuterol  (VENTOLIN  HFA) 108 (90 Base) MCG/ACT inhaler Inhale 2 puffs into the lungs every 6 (six) hours as needed for wheezing or shortness of breath. 8 g 0   aspirin  EC 81 MG tablet Take 1 tablet (81 mg total) by mouth daily. Swallow whole. 90 tablet 3   chlorpheniramine  (CHLORPHEN) 4 MG tablet Take 1 tablet (4 mg total) by mouth at bedtime. 14 tablet 0   GARLIC PO Take by mouth.     Omega-3 Fatty Acids (OMEGA-3 PO) Take 2 capsules by mouth daily.      pravastatin  (PRAVACHOL ) 20 MG tablet Take 1 tablet (20 mg total) by mouth daily. 90 tablet 3   triamterene -hydrochlorothiazide  (MAXZIDE -25) 37.5-25 MG tablet TAKE ONE TABLET BY MOUTH ONCE DAILY. 30 tablet 3   No facility-administered medications prior to visit.    Past Medical History:  Diagnosis Date   Abnormal pap    Arthritis    Fibroids    Fibroids, intramural  450-600 gm uterus   GERD (gastroesophageal reflux disease)    Hyperlipidemia    Hypertension    Menopause 08/27/2014   Neoplasm of kidney    PONV (postoperative nausea and vomiting)    Posterior pain of right hip 07/31/2013   Renal cancer, right (HCC) 05/2017   TIA (transient ischemic attack) 2017 or 2018 unsure   Vaginal Pap smear, abnormal       Objective:     BP (!) 144/82   Pulse 73   Ht 5' 2 (1.575 m)   Wt 247 lb 12.8 oz (112.4 kg)   LMP 02/17/2011 Comment: partial   SpO2 99% Comment: ra  BMI 45.32 kg/m   SpO2: 99 % (ra) MO (by bmi) amb fm nad/ occ throat clearing    HEENT  : Oropharynx  clear     Nasal turbinates  mod TE s polyps or cyanosis    NECK :  without  apparent JVD/ palpable Nodes/TM    LUNGS: no acc muscle use,  Nl contour chest which is clear to A and P bilaterally without cough on insp or exp maneuvers   CV:  RRR  no s3 or murmur or increase in P2, and no edema   ABD:  soft and nontender   MS:  Gait nl   ext warm without deformities Or obvious joint restrictions  calf tenderness, cyanosis or clubbing    SKIN: warm and dry without lesions    NEURO:  alert, approp, nl sensorium with  no motor or cerebellar deficits apparent.    I personally reviewed images and agree with radiology impression as follows:  CXR:   pa and lateral  2/227/25  Wnl     Assessment   Wheezing Onset winter 2025 on a backgrond of chronic rhinitis since childhood  - 11/04/2023  After extensive coaching inhaler device,  effectiveness =    25% (short Ti) with hfa > try symbicort 80 2 q pm/ gerd rx - Allergy screen 11/04/2023 >  Eos 0. /  IgE  pending   Absence with exertion but present q noct s awakening her is strongly suggestive of UACS over asthma  Upper airway cough syndrome (previously labeled PNDS),  is so named because it's frequently impossible to sort out how much is  CR/sinusitis with freq throat clearing (which can be related to primary GERD)   vs  causing  secondary ( extra esophageal)  GERD from wide swings in gastric pressure that occur with throat clearing, often  promoting self use of mint and menthol lozenges that reduce the lower esophageal sphincter tone and exacerbate the problem further in a cyclical fashion.   These are the same pts (now being labeled as having irritable larynx syndrome by some cough centers) who not infrequently have a history of having failed to tolerate ace inhibitors,  dry powder inhalers(or any high dose of ICS)  or biphosphonates or report having atypical/extraesophageal reflux symptoms(LPR)  that don't respond to standard  doses of PPI  and are easily confused as having aecopd or asthma flares by even experienced allergists/ pulmonologists (myself included).   11/04/2023  After extensive coaching inhaler device,  effectiveness =    50% from a baseline near 0 so rec very low dose ICS in pm and gerd rx while eval for allergic component with allergy screen above and f/u in 4 weeks  with all meds in hand using a trust but verify approach to confirm accurate Medication  Reconciliation The principal here is  that until we are certain that the  patients are doing what we've asked, it makes no sense to ask them to do more.    Discussed in detail all the  indications, usual  risks and alternatives  relative to the benefits with patient who agrees to proceed with Rx as outlined.             Each maintenance medication was reviewed in detail including emphasizing most importantly the difference between maintenance and prns and under what circumstances the prns are to be triggered using an action plan format where appropriate.  Total time for H and P, chart review, counseling, reviewing hfa  device(s) and generating customized AVS unique to this office visit / same day charting = 60 min new pt eval   for  refractory respiratory  symptoms of uncertain etiology             Ozell America, MD 11/04/2023

## 2023-11-04 ENCOUNTER — Ambulatory Visit (INDEPENDENT_AMBULATORY_CARE_PROVIDER_SITE_OTHER): Admitting: Internal Medicine

## 2023-11-04 ENCOUNTER — Encounter: Payer: Self-pay | Admitting: Internal Medicine

## 2023-11-04 VITALS — BP 144/82 | HR 73 | Ht 62.0 in | Wt 247.8 lb

## 2023-11-04 DIAGNOSIS — R053 Chronic cough: Secondary | ICD-10-CM

## 2023-11-04 DIAGNOSIS — R062 Wheezing: Secondary | ICD-10-CM | POA: Diagnosis not present

## 2023-11-04 MED ORDER — BUDESONIDE-FORMOTEROL FUMARATE 80-4.5 MCG/ACT IN AERO: INHALATION_SPRAY | RESPIRATORY_TRACT | 12 refills | Status: DC

## 2023-11-04 MED ORDER — BREZTRI AEROSPHERE 160-9-4.8 MCG/ACT IN AERO: 2.0000 | INHALATION_SPRAY | Freq: Two times a day (BID) | RESPIRATORY_TRACT | Status: AC

## 2023-11-04 NOTE — Assessment & Plan Note (Addendum)
 Onset winter 2025 on a backgrond of chronic rhinitis since childhood  - 11/04/2023  After extensive coaching inhaler device,  effectiveness =    25% (short Ti) with hfa > try symbicort 80 2 q pm/ gerd rx - Allergy screen 11/04/2023 >  Eos 0. /  IgE  pending   Absence with exertion but present q noct s awakening her is strongly suggestive of UACS over asthma  Upper airway cough syndrome (previously labeled PNDS),  is so named because it's frequently impossible to sort out how much is  CR/sinusitis with freq throat clearing (which can be related to primary GERD)   vs  causing  secondary ( extra esophageal)  GERD from wide swings in gastric pressure that occur with throat clearing, often  promoting self use of mint and menthol lozenges that reduce the lower esophageal sphincter tone and exacerbate the problem further in a cyclical fashion.   These are the same pts (now being labeled as having irritable larynx syndrome by some cough centers) who not infrequently have a history of having failed to tolerate ace inhibitors,  dry powder inhalers(or any high dose of ICS)  or biphosphonates or report having atypical/extraesophageal reflux symptoms(LPR)  that don't respond to standard doses of PPI  and are easily confused as having aecopd or asthma flares by even experienced allergists/ pulmonologists (myself included).   11/04/2023  After extensive coaching inhaler device,  effectiveness =    50% from a baseline near 0 so rec very low dose ICS in pm and gerd rx while eval for allergic component with allergy screen above and f/u in 4 weeks  with all meds in hand using a trust but verify approach to confirm accurate Medication  Reconciliation The principal here is that until we are certain that the  patients are doing what we've asked, it makes no sense to ask them to do more.    Discussed in detail all the  indications, usual  risks and alternatives  relative to the benefits with patient who agrees to proceed with  Rx as outlined.             Each maintenance medication was reviewed in detail including emphasizing most importantly the difference between maintenance and prns and under what circumstances the prns are to be triggered using an action plan format where appropriate.  Total time for H and P, chart review, counseling, reviewing hfa  device(s) and generating customized AVS unique to this office visit / same day charting = 60 min new pt eval   for  refractory respiratory  symptoms of uncertain etiology

## 2023-11-04 NOTE — Patient Instructions (Addendum)
 Plan A = Automatic = Always=    Symbicort 80 2 puff after supper   Work on inhaler technique:  relax and gently blow all the way out then take a nice smooth full deep breath back in, triggering the inhaler at same time you start breathing in.  Hold breath in for at least  5 seconds if you can. Blow out Symbicort  thru nose. Rinse and gargle with water  when done.  If mouth or throat bother you at all,  try brushing teeth/gums/tongue with arm and hammer toothpaste/ make a slurry and gargle and spit out.    Plan B = Backup (to supplement plan A, not to replace it) Use your albuterol  inhaler as a rescue medication to be used if you can't catch your breath by resting or slowing your pace  or doing a relaxed purse lip breathing pattern.  - The less you use it, the better it will work when you need it. - Ok to use the inhaler up to 2 puffs  every 4 hours if you must but call for appointment if use goes up over your usual need - Don't leave home without it !!  (think of it like the spare tire or starter fluid for your car)   Take acid medications automatically (not as needed) take 30-60 min before fist meal   GERD (REFLUX)  is an extremely common cause of respiratory symptoms just like yours , many times with no obvious heartburn at all.    It can be treated with medication, but also with lifestyle changes including elevation of the head of your bed (ideally with 6 -8inch blocks under the headboard of your bed),  Smoking cessation, avoidance of late meals, excessive alcohol, and avoid fatty foods, chocolate, peppermint, colas, red wine, and acidic juices such as orange juice.  NO MINT OR MENTHOL PRODUCTS SO NO COUGH DROPS  USE SUGARLESS CANDY INSTEAD (Jolley ranchers or Stover's or Life Savers) or even ice chips will also do - the key is to swallow to prevent all throat clearing. NO OIL BASED VITAMINS - use powdered substitutes.  Avoid fish oil when coughing.   Please remember to go to the lab department    for your tests - we will call you with the results when they are available.     Please schedule a follow up office visit in 4 weeks, sooner if needed  with all medications /inhalers/ solutions in hand so we can verify exactly what you are taking. This includes all medications from all doctors and over the counters       Each maintenance medication was reviewed in detail including emphasizing most importantly the difference between maintenance and prns and under what circumstances the prns are to be triggered using an action plan format where appropriate.  Total time for H and P, chart review, counseling, reviewing hfa device(s) and generating customized AVS unique to this office visit / same day charting = 60 min

## 2023-11-07 ENCOUNTER — Ambulatory Visit: Payer: Self-pay | Admitting: Internal Medicine

## 2023-11-07 ENCOUNTER — Ambulatory Visit (HOSPITAL_COMMUNITY)
Admission: RE | Admit: 2023-11-07 | Discharge: 2023-11-07 | Disposition: A | Discharge status: Home / Self Care | Source: Ambulatory Visit | Attending: Family Medicine | Admitting: Family Medicine

## 2023-11-07 DIAGNOSIS — Z1231 Encounter for screening mammogram for malignant neoplasm of breast: Secondary | ICD-10-CM | POA: Diagnosis present

## 2023-11-07 LAB — CBC WITH DIFFERENTIAL/PLATELET
Basophils Absolute: 0 x10E3/uL (ref 0.0–0.2)
Basos: 1 %
EOS (ABSOLUTE): 0.2 x10E3/uL (ref 0.0–0.4)
Eos: 4 %
Hematocrit: 40.5 % (ref 34.0–46.6)
Hemoglobin: 13 g/dL (ref 11.1–15.9)
Immature Grans (Abs): 0 x10E3/uL (ref 0.0–0.1)
Immature Granulocytes: 0 %
Lymphocytes Absolute: 1.8 x10E3/uL (ref 0.7–3.1)
Lymphs: 46 %
MCH: 31 pg (ref 26.6–33.0)
MCHC: 32.1 g/dL (ref 31.5–35.7)
MCV: 96 fL (ref 79–97)
Monocytes Absolute: 0.4 x10E3/uL (ref 0.1–0.9)
Monocytes: 9 %
Neutrophils Absolute: 1.6 x10E3/uL (ref 1.4–7.0)
Neutrophils: 40 %
Platelets: 239 x10E3/uL (ref 150–450)
RBC: 4.2 x10E6/uL (ref 3.77–5.28)
RDW: 12.6 % (ref 11.7–15.4)
WBC: 4.1 x10E3/uL (ref 3.4–10.8)

## 2023-11-07 LAB — IGE: IgE (Immunoglobulin E), Serum: 14 [IU]/mL (ref 6–495)

## 2023-11-08 NOTE — Progress Notes (Signed)
 ATC x1. Lmtcb

## 2023-11-11 NOTE — Progress Notes (Signed)
 Spoke with pt regarding her labs, she confirmed her understanding NFN

## 2023-12-02 ENCOUNTER — Ambulatory Visit (INDEPENDENT_AMBULATORY_CARE_PROVIDER_SITE_OTHER): Admitting: Internal Medicine

## 2023-12-02 ENCOUNTER — Encounter: Payer: Self-pay | Admitting: Internal Medicine

## 2023-12-02 VITALS — BP 148/86 | HR 67 | Ht 62.0 in | Wt 247.0 lb

## 2023-12-02 DIAGNOSIS — R062 Wheezing: Secondary | ICD-10-CM

## 2023-12-02 DIAGNOSIS — R053 Chronic cough: Secondary | ICD-10-CM | POA: Diagnosis not present

## 2023-12-02 DIAGNOSIS — Z6841 Body Mass Index (BMI) 40.0 and over, adult: Secondary | ICD-10-CM

## 2023-12-02 DIAGNOSIS — J310 Chronic rhinitis: Secondary | ICD-10-CM | POA: Insufficient documentation

## 2023-12-02 NOTE — Patient Instructions (Addendum)
 I emphasized that nasal steroids (Flonase  / nasacort  AQ) have no immediate benefit in terms of improving symptoms.  To help them reached the target tissue, the patient should use Afrin two puffs every 12 hours applied one min before using the nasal steroids.  Afrin should be stopped after no more than 5 days.  If the symptoms worsen, Afrin can be restarted after 5 days off of therapy to prevent rebound congestion from overuse of Afrin.  I also emphasized that in no way are nasal steroids a concern in terms of addiction.   No change on symbicort  80 unless you find it makes you cramp up in which case you can just leave if off for now    If nasal symptoms don't improve I will refer you to [facility] in [place] - call me for referral    Please schedule a follow up visit in 3 months but call sooner if needed

## 2023-12-02 NOTE — Assessment & Plan Note (Addendum)
 Body mass index is 45.18 kg/m.   Lab Results  Component Value Date   TSH 1.310 01/14/2020      Contributing to doe and risk of GERD >>>   reviewed the need and the process to achieve and maintain neg calorie balance > defer f/u primary care including intermittently monitoring thyroid  status     F/u here in 3 months           Each maintenance medication was reviewed in detail including emphasizing most importantly the difference between maintenance and prns and under what circumstances the prns are to be triggered using an action plan format where appropriate.  Total time for H and P, chart review, counseling, reviewing hfa  device(s) and generating customized AVS unique to this office visit / same day charting = 33 min

## 2023-12-02 NOTE — Progress Notes (Signed)
 Jo Hale, female    DOB: 1960-11-20    MRN: [id number]   Brief patient profile:  22  yobf   never smoker was told asthma as child but does not remember using inhalers or  anything but nasal sprays   referred to pulmonary clinic in [place]  11/04/2023 by Dr Bluford  for wheezing  since winter 2025   Pt not previously seen by [facility] service.    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 to date/ no chemo or RT  Referred to ENT 09/2020 per EPIC but no ENT notes they wanted to operate   History of Present Illness  11/04/2023  Pulmonary/ 1st Hale eval/ Jo Hale / Jo Hale just on albuterol  prn but not much better (note very poor baseline hfa)  Chief Complaint  Patient presents with   Establish Care  Dyspnea:  Not limited by breathing from desired activities   Cough: more of a throat clearing than cough and non-productive   Sleep: on side helps / two pillows still wheezes most nights  SABA use: only once a week  02: none  Does have overt HB as well  Rec Plan A = Automatic = Always=    Symbicort  80 2 puff after supper  Work on inhaler technique:  Plan B = Backup (to supplement plan A, not to replace it) Use your albuterol  inhaler as a rescue medication  Take acid medications automatically (not as needed) take 30-60 min before fist meal  GERD diet reviewed, bed blocks rec  Please schedule a follow up Hale visit in 4 weeks, sooner if needed  with all medications /inhalers/ solutions in hand      12/02/2023  f/u ov/Folcroft Hale/Jo Hale re: wheezing  maint on symbicort  80 2 puff after a supper   did  bring meds /has flonase  not  using because doesn't work.  Chief Complaint  Patient presents with   Cough    4 wk f/u coughing/wheezing is better    Dyspnea:  not aware of limit due to doe shopping  Cough: none but lots of nasal congestion x a decade esp at hs/ no longer wheezing  Sleeping: bed is flat/ 3 pillows on side s    resp cc  SABA use: never  02: none     No obvious day to day or daytime variability or assoc excess/ purulent sputum or mucus plugs or hemoptysis or cp or chest tightness, subjective wheeze or overt  hb symptoms.    Also denies any obvious fluctuation of symptoms with weather or environmental changes or other aggravating or alleviating factors except as outlined above   No unusual exposure hx or h/o childhood pna  or knowledge of premature birth.  Current Allergies, Complete Past Medical History, Past Surgical History, Family History, and Social History were reviewed in [employer] record.  ROS  The following are not active complaints unless bolded Hoarseness, sore throat, dysphagia, dental problems, itching, sneezing,  nasal congestion or discharge of excess mucus or purulent secretions, ear ache,   fever, chills, sweats, unintended wt loss or wt gain, classically pleuritic or exertional cp,  orthopnea pnd or arm/hand swelling  or leg swelling, presyncope, palpitations, abdominal pain, anorexia, nausea, vomiting, diarrhea  or change in bowel habits or change in bladder habits, change in stools or change in urine, dysuria, hematuria,  rash, arthralgias, visual complaints, headache, numbness, weakness or ataxia or problems with walking or coordination,  change in mood or  memory.  Current Meds  Medication Sig   Acetaminophen  (TYLENOL  PO) Take by mouth.   albuterol  (VENTOLIN  HFA) 108 (90 Base) MCG/ACT inhaler Inhale 2 puffs into the lungs every 6 (six) hours as needed for wheezing or shortness of breath.   aspirin  EC 81 MG tablet Take 1 tablet (81 mg total) by mouth daily. Swallow whole.   budesonide -formoterol  (SYMBICORT ) 80-4.5 MCG/ACT inhaler 2 puffs after supper   chlorpheniramine  (CHLORPHEN) 4 MG tablet Take 1 tablet (4 mg total) by mouth at bedtime.   GARLIC PO Take by mouth.   Omega-3 Fatty Acids (OMEGA-3 PO) Take 2 capsules by mouth daily.    pravastatin  (PRAVACHOL ) 20 MG tablet Take 1 tablet  (20 mg total) by mouth daily.   triamterene -hydrochlorothiazide  (MAXZIDE -25) 37.5-25 MG tablet TAKE ONE TABLET BY MOUTH ONCE DAILY.             Past Medical History:  Diagnosis Date   Abnormal pap    Arthritis    Fibroids    Fibroids, intramural    450-600 gm uterus   GERD (gastroesophageal reflux disease)    Hyperlipidemia    Hypertension    Menopause 08/27/2014   Neoplasm of kidney    PONV (postoperative nausea and vomiting)    Posterior pain of right hip 07/31/2013   Renal cancer, right (HCC) 05/2017   TIA (transient ischemic attack) 2017 or 2018 unsure   Vaginal Pap smear, abnormal       Objective:       Wt Readings from Last 3 Encounters:  12/02/23 247 lb (112 kg)  11/04/23 247 lb 12.8 oz (112.4 kg)  10/27/23 247 lb (112 kg)      Vital signs reviewed  12/02/2023  - Note at rest 02 sats  96% on RA   General appearance:    amb morbidly obese (by BMI )  bf nad         HEENT : Oropharynx  clear      Nasal turbinates mod/ severe edema s pallor or polyps   NECK :  without  apparent JVD/ palpable Nodes/TM    LUNGS: no acc muscle use,  Nl contour chest which is clear to A and P bilaterally without cough on insp or exp maneuvers   CV:  RRR  no s3 or murmur or increase in P2, and no edema   ABD:  soft and nontender   MS:  Gait nl   ext warm without deformities Or obvious joint restrictions  calf tenderness, cyanosis or clubbing    SKIN: warm and dry without lesions    NEURO:  alert, approp, nl sensorium with  no motor or cerebellar deficits apparent.       Assessment     Assessment & Plan Chronic cough  Wheezing Onset winter 2025 on a backgrond of chronic rhinitis since childhood  - 11/04/2023   > try symbicort  80 2 q pm/ gerd rx - Allergy screen 11/04/2023 >  Eos 0. 2/  IgE  14 -12/02/2023  After extensive coaching inhaler device,  effectiveness =    75% smi > continue symbicort  80 up to 2 bid as long as not cramping on it and if so replace with  asmanex  100 2bid  Noct symptoms are better on symbiocrt 80 except for nasal obst x decades  > see chronic rhinitis  F/u in 3 m, soon ef needed  Rhinitis, chronic Onset around 2015 esp qhs -  Allergy screen 11/04/2023 >  Eos 0. 2/  IgE  14 - 12/02/2023 rec afrin x  5 d only, and only prior to using otc Nasal ICS > ent re-eval next   She has already seen ent but no record in EPIC and the rec was for nasal surgery so will refer to [facility] if the above rx is not effective for her noct nasal obst symptoms    Morbid obesity due to excess calories (HCC) Body mass index is 45.18 kg/m.   Lab Results  Component Value Date   TSH 1.310 01/14/2020      Contributing to doe and risk of GERD >>>   reviewed the need and the process to achieve and maintain neg calorie balance > defer f/u primary care including intermittently monitoring thyroid  status     F/u here in 3 months           Each maintenance medication was reviewed in detail including emphasizing most importantly the difference between maintenance and prns and under what circumstances the prns are to be triggered using an action plan format where appropriate.  Total time for H and P, chart review, counseling, reviewing hfa  device(s) and generating customized AVS unique to this Hale visit / same day charting = 33 min          Patient Instructions  I emphasized that nasal steroids (Flonase  / nasacort  AQ) have no immediate benefit in terms of improving symptoms.  To help them reached the target tissue, the patient should use Afrin two puffs every 12 hours applied one min before using the nasal steroids.  Afrin should be stopped after no more than 5 days.  If the symptoms worsen, Afrin can be restarted after 5 days off of therapy to prevent rebound congestion from overuse of Afrin.  I also emphasized that in no way are nasal steroids a concern in terms of addiction.   No change on symbicort  80 unless you find it makes you cramp up  in which case you can just leave if off for now    If nasal symptoms don't improve I will refer you to [facility] in [place] - call me for referral    Please schedule a follow up visit in 3 months but call sooner if needed    Ozell America, MD 12/02/2023

## 2023-12-02 NOTE — Assessment & Plan Note (Addendum)
 Onset winter 2025 on a backgrond of chronic rhinitis since childhood  - 11/04/2023   > try symbicort  80 2 q pm/ gerd rx - Allergy screen 11/04/2023 >  Eos 0. 2/  IgE  14 -12/02/2023  After extensive coaching inhaler device,  effectiveness =    75% smi > continue symbicort  80 up to 2 bid as long as not cramping on it and if so replace with asmanex  100 2bid  Noct symptoms are better on symbiocrt 80 except for nasal obst x decades  > see chronic rhinitis  F/u in 3 m, soon ef needed

## 2023-12-02 NOTE — Assessment & Plan Note (Addendum)
 Onset around 2015 esp qhs -  Allergy screen 11/04/2023 >  Eos 0. 2/  IgE  14 - 12/02/2023 rec afrin x  5 d only, and only prior to using otc Nasal ICS > ent re-eval next   She has already seen ent but no record in EPIC and the rec was for nasal surgery so will refer to [facility] if the above rx is not effective for her noct nasal obst symptoms

## 2024-01-06 ENCOUNTER — Ambulatory Visit

## 2024-01-10 ENCOUNTER — Telehealth: Payer: Self-pay | Admitting: Internal Medicine

## 2024-01-10 NOTE — Telephone Encounter (Signed)
 Spoke with patient regarding the  Friday 03/02/24 schedule change for Dr. Cy rescheduled to Wednesday 02/1924 at 2:30 pm---will mail information to patient and she voiced her understanding

## 2024-01-13 ENCOUNTER — Other Ambulatory Visit: Payer: Self-pay | Admitting: Orthopedic Surgery

## 2024-01-13 DIAGNOSIS — G8929 Other chronic pain: Secondary | ICD-10-CM

## 2024-01-13 DIAGNOSIS — M25461 Effusion, right knee: Secondary | ICD-10-CM

## 2024-02-29 ENCOUNTER — Ambulatory Visit: Admitting: Internal Medicine

## 2024-02-29 ENCOUNTER — Encounter: Payer: Self-pay | Admitting: Internal Medicine

## 2024-02-29 VITALS — BP 136/65 | HR 75 | Ht 62.0 in | Wt 245.0 lb

## 2024-02-29 DIAGNOSIS — Z6841 Body Mass Index (BMI) 40.0 and over, adult: Secondary | ICD-10-CM

## 2024-02-29 DIAGNOSIS — R053 Chronic cough: Secondary | ICD-10-CM | POA: Diagnosis not present

## 2024-02-29 DIAGNOSIS — J310 Chronic rhinitis: Secondary | ICD-10-CM | POA: Diagnosis not present

## 2024-02-29 DIAGNOSIS — R062 Wheezing: Secondary | ICD-10-CM

## 2024-02-29 NOTE — Patient Instructions (Signed)
 I emphasized that nasal steroids have no immediate benefit in terms of improving symptoms.  To help them reached the target tissue, the patient should use Afrin two puffs every 12 hours applied one min before using the nasal steroids.  Afrin should be stopped after no more than 5 days.  If the symptoms worsen, Afrin can be restarted after 5 days off of therapy to prevent rebound congestion from overuse of Afrin.  I also emphasized that in no way are nasal steroids a concern in terms of addiction.    Follow up with ENT if needed    Pulmonary follow up is also as needed

## 2024-02-29 NOTE — Assessment & Plan Note (Addendum)
 Body mass index is 44.81 kg/m.   Lab Results  Component Value Date   TSH 1.310 01/14/2020   Contributing to doe and risk of GERD/dvt/ PE  >>>   reviewed the need and the process to achieve and maintain neg calorie balance > defer f/u primary care including intermittently monitoring thyroid  status            Each maintenance medication was reviewed in detail including emphasizing most importantly the difference between maintenance and prns and under what circumstances the prns are to be triggered using an action plan format where appropriate.  Total time for H and P, chart review, counseling, reviewing hfa/nasal device(s) and generating customized AVS unique to this office visit / same day charting = 25 min

## 2024-02-29 NOTE — Progress Notes (Signed)
 Jo Hale, female    DOB: 06-19-1960    MRN: [id number]   Brief patient profile:  73  yobf   never smoker was told asthma as child but does not remember using inhalers or  anything but nasal sprays   referred to pulmonary clinic in [place]  11/04/2023 by Dr Bluford  for wheezing  since winter 2025   Pt not previously seen by [facility] service.    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 to date/ no chemo or RT  Referred to ENT 09/2020 per EPIC but no ENT notes they wanted to operate   History of Present Illness  11/04/2023  Pulmonary/ 1st Hale eval/ Jo Hale / Tinnie Hale just on albuterol  prn but not much better (note very poor baseline hfa)  Chief Complaint  Patient presents with   Establish Care  Dyspnea:  Not limited by breathing from desired activities   Cough: more of a throat clearing than cough and non-productive   Sleep: on side helps / two pillows still wheezes most nights  SABA use: only once a week  02: none  Does have overt HB as well  Rec Plan A = Automatic = Always=    Symbicort  80 2 puff after supper  Work on inhaler technique:  Plan B = Backup (to supplement plan A, not to replace it) Use your albuterol  inhaler as a rescue medication  Take acid medications automatically (not as needed) take 30-60 min before fist meal  GERD diet reviewed, bed blocks rec  Please schedule a follow up Hale visit in 4 weeks, sooner if needed  with all medications /inhalers/ solutions in hand      12/02/2023  f/u ov/Jo Hale/Jo Hale re: wheezing  maint on symbicort  80 2 puff after a supper   did  bring meds /has flonase  not  using because doesn't work.  Chief Complaint  Patient presents with   Cough    4 wk f/u coughing/wheezing is better    Dyspnea:  not aware of limit due to doe/ shopping  Cough: none but lots of nasal congestion x a decade esp at hs/ no longer wheezing  Sleeping: bed is flat/ 3 pillows on side s    resp cc  SABA use: never  02: none   Patient Instructions  I emphasized that nasal steroids (Flonase  / nasacort  AQ) have no immediate benefit No change on symbicort  80 unless you find it makes you cramp up in which case you can just leave if off for now If nasal symptoms don't improve I will refer you to [facility] in [place] - call me for referral       02/29/2024 3 m  f/u ov/Jo Hale/Jo Hale rhinitis since childhood/  wheezing x winter 2025   symbicort  80  prn  Chief Complaint  Patient presents with   Cough    F/u     Dyspnea:  very active with homeless shelter  Cough: none  Sleeping: flat with 3 pillows  s  resp cc  SABA use: saba  02: none       No obvious day to day or daytime variability or assoc excess/ purulent sputum or mucus plugs or hemoptysis or cp or chest tightness, subjective wheeze or overt sinus or hb symptoms.    Also denies any obvious fluctuation of symptoms with weather or environmental changes or other aggravating or alleviating factors except as outlined above   No unusual exposure hx or h/o childhood pna or knowledge of premature  birth.  Current Allergies, Complete Past Medical History, Past Surgical History, Family History, and Social History were reviewed in [employer] record.  ROS  The following are not active complaints unless bolded Hoarseness, sore throat, dysphagia, dental problems, itching, sneezing,  nasal congestion or discharge of excess mucus or purulent secretions, ear ache,   fever, chills, sweats, unintended wt loss or wt gain, classically pleuritic or exertional cp,  orthopnea pnd or arm/hand swelling  or leg swelling, presyncope, palpitations, abdominal pain, anorexia, nausea, vomiting, diarrhea  or change in bowel habits or change in bladder habits, change in stools or change in urine, dysuria, hematuria,  rash, arthralgias, visual complaints, headache, numbness, weakness or ataxia or problems with walking or coordination,  change in mood or   memory.        Current Meds  Medication Sig   Acetaminophen  (TYLENOL  PO) Take by mouth.   aspirin  EC 81 MG tablet Take 1 tablet (81 mg total) by mouth daily. Swallow whole.   chlorpheniramine  (CHLORPHEN) 4 MG tablet Take 1 tablet (4 mg total) by mouth at bedtime.   GARLIC PO Take by mouth.   Omega-3 Fatty Acids (OMEGA-3 PO) Take 2 capsules by mouth daily.    triamterene -hydrochlorothiazide  (MAXZIDE -25) 37.5-25 MG tablet TAKE ONE TABLET BY MOUTH ONCE DAILY.               Past Medical History:  Diagnosis Date   Abnormal pap    Arthritis    Fibroids    Fibroids, intramural    450-600 gm uterus   GERD (gastroesophageal reflux disease)    Hyperlipidemia    Hypertension    Menopause 08/27/2014   Neoplasm of kidney    PONV (postoperative nausea and vomiting)    Posterior pain of right hip 07/31/2013   Renal cancer, right (HCC) 05/2017   TIA (transient ischemic attack) 2017 or 2018 unsure   Vaginal Pap smear, abnormal       Objective:    wts   02/29/2024     245   12/02/23 247 lb (112 kg)  11/04/23 247 lb 12.8 oz (112.4 kg)  10/27/23 247 lb (112 kg)    Vital signs reviewed  02/29/2024  - Note at rest 02 sats  99% on RA   General appearance:    MO (by bmi) amb bf nad      HEENT : Oropharynx  clear      Nasal turbinates mod severe turbinate edema s polyps    NECK :  without  apparent JVD/ palpable Nodes/TM    LUNGS: no acc muscle use,  Nl contour chest which is clear to A and P bilaterally without cough on insp or exp maneuvers   CV:  RRR  no s3 or murmur or increase in P2, and no edema   ABD:  soft and nontender   MS:  Gait bk   ext warm without deformities Or obvious joint restrictions  calf tenderness, cyanosis or clubbing    SKIN: warm and dry without lesions    NEURO:  alert, approp, nl sensorium with  no motor or cerebellar deficits apparent.         Assessment      Assessment & Plan Chronic cough  Rhinitis, chronic Onset around 2015 esp  qhs -  Allergy screen 11/04/2023 >  Eos 0. 2/  IgE  14 - 12/02/2023 rec afrin x  5 d only, and only prior to using otc Nasal ICS > ent re-eval next  >  repeated same recs 02/29/2024   Pulmonary f/u is prn  Wheezing Onset winter 2025 on a backgrond of chronic rhinitis since childhood  - 11/04/2023   > try symbicort  80 2 q pm/ gerd rx - Allergy screen 11/04/2023 >  Eos 0. 2/  IgE  14 -12/02/2023  After extensive coaching inhaler device,  effectiveness =    75% smi > continue symbicort  80 up to 2 bid as long as not cramping on it and if so replace with asmanex  100 2bid   All goals of chronic asthma control met including optimal function and elimination of symptoms with minimal need for rescue therapy  on symbicort  80 up to 2 bid   Contingencies discussed in full including contacting this Hale immediately if not controlling the symptoms using the rule of two's.    Morbid obesity due to excess calories (HCC) Body mass index is 44.81 kg/m.   Lab Results  Component Value Date   TSH 1.310 01/14/2020   Contributing to doe and risk of GERD/dvt/ PE  >>>   reviewed the need and the process to achieve and maintain neg calorie balance > defer f/u primary care including intermittently monitoring thyroid  status            Each maintenance medication was reviewed in detail including emphasizing most importantly the difference between maintenance and prns and under what circumstances the prns are to be triggered using an action plan format where appropriate.  Total time for H and P, chart review, counseling, reviewing hfa/nasal device(s) and generating customized AVS unique to this Hale visit / same day charting = 25 min           AVS  Patient Instructions  I emphasized that nasal steroids have no immediate benefit in terms of improving symptoms.  To help them reached the target tissue, the patient should use Afrin two puffs every 12 hours applied one min before using the nasal steroids.  Afrin  should be stopped after no more than 5 days.  If the symptoms worsen, Afrin can be restarted after 5 days off of therapy to prevent rebound congestion from overuse of Afrin.  I also emphasized that in no way are nasal steroids a concern in terms of addiction.    Follow up with ENT if needed    Pulmonary follow up is also as needed    Ozell America, MD 02/29/2024

## 2024-02-29 NOTE — Assessment & Plan Note (Addendum)
 Onset winter 2025 on a backgrond of chronic rhinitis since childhood  - 11/04/2023   > try symbicort  80 2 q pm/ gerd rx - Allergy screen 11/04/2023 >  Eos 0. 2/  IgE  14 -12/02/2023  After extensive coaching inhaler device,  effectiveness =    75% smi > continue symbicort  80 up to 2 bid as long as not cramping on it and if so replace with asmanex  100 2bid   All goals of chronic asthma control met including optimal function and elimination of symptoms with minimal need for rescue therapy  on symbicort  80 up to 2 bid   Contingencies discussed in full including contacting this office immediately if not controlling the symptoms using the rule of two's.

## 2024-02-29 NOTE — Assessment & Plan Note (Addendum)
 Onset around 2015 esp qhs -  Allergy screen 11/04/2023 >  Eos 0. 2/  IgE  14 - 12/02/2023 rec afrin x  5 d only, and only prior to using otc Nasal ICS > ent re-eval next  > repeated same recs 02/29/2024   Pulmonary f/u is prn

## 2024-03-01 ENCOUNTER — Encounter: Payer: Self-pay | Admitting: Adult Health

## 2024-03-01 ENCOUNTER — Other Ambulatory Visit (HOSPITAL_COMMUNITY)
Admission: RE | Admit: 2024-03-01 | Discharge: 2024-03-01 | Disposition: A | Source: Ambulatory Visit | Attending: Adult Health | Admitting: Adult Health

## 2024-03-01 ENCOUNTER — Ambulatory Visit (INDEPENDENT_AMBULATORY_CARE_PROVIDER_SITE_OTHER): Admitting: Adult Health

## 2024-03-01 VITALS — BP 119/69 | HR 65 | Ht 62.0 in | Wt 250.0 lb

## 2024-03-01 DIAGNOSIS — Z01419 Encounter for gynecological examination (general) (routine) without abnormal findings: Secondary | ICD-10-CM | POA: Insufficient documentation

## 2024-03-01 DIAGNOSIS — I10 Essential (primary) hypertension: Secondary | ICD-10-CM

## 2024-03-01 DIAGNOSIS — Z90711 Acquired absence of uterus with remaining cervical stump: Secondary | ICD-10-CM

## 2024-03-01 DIAGNOSIS — R319 Hematuria, unspecified: Secondary | ICD-10-CM | POA: Diagnosis not present

## 2024-03-01 LAB — POCT URINALYSIS DIPSTICK
Glucose, UA: NEGATIVE
Ketones, UA: NEGATIVE
Leukocytes, UA: NEGATIVE
Nitrite, UA: NEGATIVE
Protein, UA: NEGATIVE

## 2024-03-01 NOTE — Progress Notes (Signed)
 Patient ID: Jo Hale, female   DOB: October 06, 1960, 63 y.o.   MRN: [id number] History of Present Illness: Jo Hale is a 63 year old black female, married, sp [facility], in for a well woman gyn exam and she wants a pap.     Component Value Date/Time   DIAGPAP  01/25/2023 1225    - Negative for intraepithelial lesion or malignancy (NILM)   DIAGPAP  08/27/2020 0957    - Negative for intraepithelial lesion or malignancy (NILM)   DIAGPAP  09/01/2017 0000    NEGATIVE FOR INTRAEPITHELIAL LESIONS OR MALIGNANCY.   HPVHIGH Negative 01/25/2023 1225   HPVHIGH Negative 08/27/2020 0957   ADEQPAP  01/25/2023 1225    Satisfactory for evaluation; transformation zone component PRESENT.   ADEQPAP  08/27/2020 0957    Satisfactory for evaluation; transformation zone component ABSENT.   ADEQPAP  09/01/2017 0000    Satisfactory for evaluation  endocervical/transformation zone component ABSENT.   PCP is Dr Bluford   Current Medications, Allergies, Past Medical History, Past Surgical History, Family History and Social History were reviewed in [employer] electronic medical record.     Review of Systems: Patient denies any headaches, hearing loss, fatigue, blurred vision, shortness of breath, chest pain, abdominal pain, problems with bowel movements, urination, or intercourse.(Not active) No mood swings.  Has pain in left thigh area, burns, has had low back issues and has seen Dr Margrette to follow up with him. Knees hurt too   Physical Exam:BP 119/69 (BP Location: Left Arm, Patient Position: Sitting, Cuff Size: Large)   Pulse 65   Ht 5' 2 (1.575 m)   Wt 250 lb (113.4 kg)   LMP 02/17/2011 Comment: partial   BMI 45.73 kg/m   urine dipstick was trace  General:  Well developed, well nourished, no acute distress Skin:  Warm and dry Neck:  Midline trachea, normal thyroid , good ROM, no lymphadenopathy, no carotid bruits heard  Lungs; Clear to auscultation bilaterally Breast:  No dominant palpable mass,  retraction, or nipple discharge Cardiovascular: Regular rate and rhythm Abdomen:  Soft, non tender, no hepatosplenomegaly Pelvic:  External genitalia is normal in appearance, no lesions.  The vagina is pale. Urethra has no lesions or masses. The cervix is smooth.  Uterus is absent.  No adnexal masses or tenderness noted.Bladder is non tender, no masses felt. Rectal: Deferred  Extremities/musculoskeletal:  No swelling or varicosities noted, no clubbing or cyanosis Psych:  No mood changes, alert and cooperative,seems happy AA is 0 Fall risk is moderate     03/01/2024   11:31 AM 10/27/2023    9:26 AM 07/20/2023    3:09 PM  Depression screen PHQ 2/9  Decreased Interest 0 0 0  Down, Depressed, Hopeless 0 0 0  PHQ - 2 Score 0 0 0  Altered sleeping 0 0 0  Tired, decreased energy 0 0 0  Change in appetite 0 0 0  Feeling bad or failure about yourself  0 0 0  Trouble concentrating 0 0 0  Moving slowly or fidgety/restless 0 0 0  Suicidal thoughts 0 0 0  PHQ-9 Score 0 0  0   Difficult doing work/chores  Not difficult at all Not difficult at all     Data saved with a previous flowsheet row definition       03/01/2024   11:31 AM 10/27/2023    9:26 AM 07/20/2023    3:09 PM 05/17/2023   11:33 AM  GAD 7 : Generalized Anxiety Score  Nervous, Anxious, on  Edge 0 0 0 0  Control/stop worrying 0 0 0 0  Worry too much - different things 0 0 0 0  Trouble relaxing 0 0 0 0  Restless 0 0 0 0  Easily annoyed or irritable 0 0 0 0  Afraid - awful might happen 0 0 0 0  Total GAD 7 Score 0 0 0 0  Anxiety Difficulty  Not difficult at all Not difficult at all Not difficult at all    Upstream - 03/01/24 1144       Pregnancy Intention Screening   Does the patient want to become pregnant in the next year? N/A    Does the patient's partner want to become pregnant in the next year? N/A    Would the patient like to discuss contraceptive options today? N/A      Contraception Wrap Up   Current Method  Abstinence;Hysterectomy   [name]   End Method Abstinence;Hysterectomy   [facility]   Contraception Counseling Provided No         Examination chaperoned by [name] LPN    Impression and plan: 1. Encounter for gynecological examination with Papanicolaou smear of cervix (Primary) Pap sent Physical in 1 year Labs with PCP, had in June 2025 Mammogram was negative 11/07/23 Colonoscopy 2029 See Dr Margrette about burning in left leg  - Cytology - PAP( [facility])  2. Hematuria, unspecified type UA C&S sent  - POCT Urinalysis Dipstick  3. S/P abdominal supracervical subtotal hysterectomy  4. Essential hypertension, benign Continue meds and follow up with PCP

## 2024-03-02 ENCOUNTER — Ambulatory Visit: Admitting: Internal Medicine

## 2024-03-02 LAB — URINALYSIS, ROUTINE W REFLEX MICROSCOPIC
Bilirubin, UA: NEGATIVE
Glucose, UA: NEGATIVE
Ketones, UA: NEGATIVE
Leukocytes,UA: NEGATIVE
Nitrite, UA: NEGATIVE
Protein,UA: NEGATIVE
RBC, UA: NEGATIVE
Specific Gravity, UA: 1.015 (ref 1.005–1.030)
Urobilinogen, Ur: 0.2 mg/dL (ref 0.2–1.0)
pH, UA: 7.5 (ref 5.0–7.5)

## 2024-03-03 LAB — URINE CULTURE

## 2024-03-05 ENCOUNTER — Ambulatory Visit: Payer: Self-pay | Admitting: Adult Health

## 2024-03-05 LAB — CYTOLOGY - PAP
Comment: NEGATIVE
Diagnosis: NEGATIVE
High risk HPV: NEGATIVE

## 2024-03-06 ENCOUNTER — Other Ambulatory Visit: Payer: Self-pay | Admitting: Family Medicine

## 2024-03-06 DIAGNOSIS — I10 Essential (primary) hypertension: Secondary | ICD-10-CM

## 2024-03-12 ENCOUNTER — Telehealth: Payer: Self-pay | Admitting: Adult Health

## 2024-03-12 NOTE — Telephone Encounter (Signed)
 Pt is requesting lab results.

## 2024-03-12 NOTE — Telephone Encounter (Signed)
 Pt aware of normal pap and urine culture. Pt has woke up with rash on bottom. Pt states it don't itch. Pt wants JAG to take a look. JAG will see pt tomorrow at 3:50 pm. JSY

## 2024-03-13 ENCOUNTER — Ambulatory Visit: Admitting: Adult Health

## 2024-03-13 ENCOUNTER — Encounter: Payer: Self-pay | Admitting: Adult Health

## 2024-03-13 VITALS — BP 132/61 | HR 78 | Ht 62.0 in | Wt 248.5 lb

## 2024-03-13 DIAGNOSIS — R238 Other skin changes: Secondary | ICD-10-CM | POA: Insufficient documentation

## 2024-03-13 MED ORDER — VALACYCLOVIR HCL 1 G PO TABS: 1000.0000 mg | ORAL_TABLET | Freq: Two times a day (BID) | ORAL | 1 refills | Status: DC

## 2024-03-13 NOTE — Progress Notes (Signed)
  Subjective:     Patient ID: Jo Hale, female   DOB: 09-Nov-1960, 63 y.o.   MRN: [id number]  HPI Jo Hale is a 63 year old black female,married, sp [facility] in complaining of rash, and blister like bump left buttock, noticed yesterday.     Component Value Date/Time   DIAGPAP  03/01/2024 1145    - Negative for intraepithelial lesion or malignancy (NILM)   DIAGPAP  01/25/2023 1225    - Negative for intraepithelial lesion or malignancy (NILM)   DIAGPAP  08/27/2020 0957    - Negative for intraepithelial lesion or malignancy (NILM)   HPVHIGH Negative 03/01/2024 1145   HPVHIGH Negative 01/25/2023 1225   HPVHIGH Negative 08/27/2020 0957   ADEQPAP Satisfactory for evaluation. 03/01/2024 1145   ADEQPAP  01/25/2023 1225    Satisfactory for evaluation; transformation zone component PRESENT.   ADEQPAP  08/27/2020 0957    Satisfactory for evaluation; transformation zone component ABSENT.    PCP is Dr Bluford   Review of Systems Noticed rash, and blister like bump left buttock yesterday Gets hemorrhoids first of the month, try preparation H instead of vaseline  Reviewed past medical,surgical, social and family history. Reviewed medications and allergies.     Objective:   Physical Exam BP 132/61 (BP Location: Left Arm, Patient Position: Sitting, Cuff Size: Large)   Pulse 78   Ht 5' 2 (1.575 m)   Wt 248 lb 8 oz (112.7 kg)   LMP 02/17/2011 Comment: partial   BMI 45.45 kg/m     Skin warm and dry, has 2-3 vesicles, left buttock, herpes culture obtained.  Upstream - 03/13/24 1609       Pregnancy Intention Screening   Does the patient want to become pregnant in the next year? N/A    Does the patient's partner want to become pregnant in the next year? N/A    Would the patient like to discuss contraceptive options today? N/A      Contraception Wrap Up   Current Method Hysterectomy   [facility]   End Method Hysterectomy   [facility]   Contraception Counseling Provided No          Assessment:      1. Vesicle of skin (Primary) Has vesicles left buttock Discussed looks like herpes virus  HSV culture obtained Will rx valtrex Meds ordered this encounter  Medications   valACYclovir (VALTREX) 1000 MG tablet    Sig: Take 1 tablet (1,000 mg total) by mouth 2 (two) times daily.    Dispense:  20 tablet    Refill:  1    Supervising Provider:   JAYNE MINDER H [[id number]]    - Herpes simplex virus culture     Plan:     Follow up prn

## 2024-03-16 ENCOUNTER — Ambulatory Visit

## 2024-03-16 ENCOUNTER — Ambulatory Visit: Payer: Self-pay | Admitting: Adult Health

## 2024-03-16 LAB — HERPES SIMPLEX VIRUS CULTURE

## 2024-03-26 ENCOUNTER — Other Ambulatory Visit: Payer: Self-pay | Admitting: Adult Health

## 2024-04-26 ENCOUNTER — Ambulatory Visit

## 2024-04-26 VITALS — Ht 62.0 in | Wt 235.0 lb

## 2024-04-26 DIAGNOSIS — Z0000 Encounter for general adult medical examination without abnormal findings: Secondary | ICD-10-CM

## 2024-04-26 DIAGNOSIS — Z2821 Immunization not carried out because of patient refusal: Secondary | ICD-10-CM | POA: Diagnosis not present

## 2024-04-26 NOTE — Patient Instructions (Signed)
 Jo Hale,  Thank you for taking the time for your Medicare Wellness Visit. I appreciate your continued commitment to your health goals. Please review the care plan we discussed, and feel free to reach out if I can assist you further.  Please note that Annual Wellness Visits do not include a physical exam. Some assessments may be limited, especially if the visit was conducted virtually. If needed, we may recommend an in-person follow-up with your provider.  Ongoing Care Seeing your primary care provider every 3 to 6 months helps us  monitor your health and provide consistent, personalized care.   1 year follow up for Medicare well visit: May 02, 2025 at 10:40 am with medicare wellness nurse in office  Recommended Screenings:  Health Maintenance  Topic Date Due   Zoster (Shingles) Vaccine (1 of 2) Never done   Medicare Annual Wellness Visit  05/01/2023   Flu Shot  Never done   HIV Screening  10/26/2024*   Pneumococcal Vaccine for age over 40 (1 of 2 - PCV) 12/01/2024*   Breast Cancer Screening  11/06/2024   Colon Cancer Screening  03/20/2028   Pap with HPV screening  03/01/2029   HPV Vaccine (No Doses Required) Completed   Hepatitis C Screening  Completed   Hepatitis B Vaccine  Aged Out   Meningitis B Vaccine  Aged Out   DTaP/Tdap/Td vaccine  Discontinued   COVID-19 Vaccine  Discontinued  *Topic was postponed. The date shown is not the original due date.       04/30/2022    8:31 AM  Advanced Directives  Does Patient Have a Medical Advance Directive? No  Would patient like information on creating a medical advance directive? Yes (MAU/Ambulatory/Procedural Areas - Information given)    Vision: Annual vision screenings are recommended for early detection of glaucoma, cataracts, and diabetic retinopathy. These exams can also reveal signs of chronic conditions such as diabetes and high blood pressure.  There are several Eye Doctors in your area. Here are a few that usually  accept all insurance types: Please let us  know if you require a referral for an eye exam appointment. Thank you!  [facility]) [address] [place], [place] [address] Phone: [phone]  [facility] [address] [place], [place] [address] Phone: [phone]  [facility] [address] [place], [place] [address] Phone: [phone]  [facility] [address] [place], [place] [address] Phone: [phone]  [facility] [address] [place], [place] [address] Phone: [phone]  [facility] [address] [place], [place] [address] Phone: [phone]    Dental: Annual dental screenings help detect early signs of oral cancer, gum disease, and other conditions linked to overall health, including heart disease and diabetes.  Please see the attached documents for additional preventive care recommendations.

## 2024-04-26 NOTE — Progress Notes (Signed)
 "  Chief Complaint  Patient presents with   Medicare Wellness    No voiced or noted concerns at this time.  Subjective:   Jo Hale is a 64 y.o. female who presents for a Medicare Annual Wellness Visit.  Visit info / Clinical Intake: Medicare Wellness Visit Type:: Subsequent Annual Wellness Visit Persons participating in visit and providing information:: patient Medicare Wellness Visit Mode:: Telephone If telephone:: video error Since this visit was completed virtually, some vitals may be partially provided or unavailable. Missing vitals are due to the limitations of the virtual format.: Documented vitals are patient reported If Telephone or Video please confirm:: I connected with patient using audio/video enable telemedicine. I verified patient identity with two identifiers, discussed telehealth limitations, and patient agreed to proceed. Patient Location:: home Provider Location:: home office Interpreter Needed?: No Pre-visit prep was completed: yes AWV questionnaire completed by patient prior to visit?: no Living arrangements:: lives with spouse/significant other Patient's Overall Health Status Rating: very good Typical amount of pain: some Does pain affect daily life?: (!) yes Are you currently prescribed opioids?: no  Dietary Habits and Nutritional Risks How many meals a day?: 2 Eats fruit and vegetables daily?: yes Most meals are obtained by: preparing own meals In the last 2 weeks, have you had any of the following?: none Diabetic:: no  Functional Status Activities of Daily Living (to include ambulation/medication): Independent Ambulation: Dependent Medication Administration: Independent Home Management (perform basic housework or laundry): Independent Manage your own finances?: yes Primary transportation is: driving Concerns about vision?: no *vision screening is required for WTM* Concerns about hearing?: no  Fall Screening Falls in the past year?:  1 Number of falls in past year: 0 Was there an injury with Fall?: 0 Fall Risk Category Calculator: 1 Patient Fall Risk Level: Low Fall Risk  Fall Risk Patient at Risk for Falls Due to: History of fall(s); Orthopedic patient Fall risk Follow up: Falls evaluation completed; Education provided; Falls prevention discussed  Home and Transportation Safety: All rugs have non-skid backing?: yes All stairs or steps have railings?: yes Grab bars in the bathtub or shower?: yes Have non-skid surface in bathtub or shower?: (!) no Good home lighting?: yes Regular seat belt use?: yes Hospital stays in the last year:: no  Cognitive Assessment Difficulty concentrating, remembering, or making decisions? : no Will 6CIT or Mini Cog be Completed: yes What year is it?: 0 points What month is it?: 0 points Give patient an address phrase to remember (5 components): [address] [place] About what time is it?: 0 points Count backwards from 20 to 1: 0 points Say the months of the year in reverse: 0 points Repeat the address phrase from earlier: 0 points 6 CIT Score: 0 points  Advance Directives (For Healthcare) Does Patient Have a Medical Advance Directive?: No Would patient like information on creating a medical advance directive?: No - Patient declined  Reviewed/Updated  Reviewed/Updated: Reviewed All (Medical, Surgical, Family, Medications, Allergies, Care Teams, Patient Goals)    Allergies (verified) Adhesive [tape]   Current Medications (verified) Outpatient Encounter Medications as of 04/26/2024  Medication Sig   Acetaminophen  (TYLENOL  PO) Take by mouth.   albuterol  (VENTOLIN  HFA) 108 (90 Base) MCG/ACT inhaler Inhale 2 puffs into the lungs every 6 (six) hours as needed for wheezing or shortness of breath.   aspirin  EC 81 MG tablet Take 1 tablet (81 mg total) by mouth daily. Swallow whole.   budesonide -formoterol  (SYMBICORT ) 80-4.5 MCG/ACT inhaler 2 puffs after  supper   Omega-3 Fatty  Acids (OMEGA-3 PO) Take 2 capsules by mouth daily.    omeprazole  (PRILOSEC) 40 MG capsule Take 40 mg by mouth daily.   pantoprazole  (PROTONIX ) 40 MG tablet    pravastatin  (PRAVACHOL ) 20 MG tablet Take 20 mg by mouth daily.   triamterene -hydrochlorothiazide  (MAXZIDE -25) 37.5-25 MG tablet TAKE ONE TABLET BY MOUTH ONCE DAILY.   valACYclovir  (VALTREX ) 1000 MG tablet Take 1 tablet (1,000 mg total) by mouth 2 (two) times daily.   No facility-administered encounter medications on file as of 04/26/2024.    History: Past Medical History:  Diagnosis Date   Abnormal pap    Arthritis    Fibroids    Fibroids, intramural    450-600 gm uterus   GERD (gastroesophageal reflux disease)    Hyperlipidemia    Hypertension    Menopause 08/27/2014   Neoplasm of kidney    PONV (postoperative nausea and vomiting)    Posterior pain of right hip 07/31/2013   Renal cancer, right (HCC) 05/2017   TIA (transient ischemic attack) 2017 or 2018 unsure   Vaginal Pap smear, abnormal    Past Surgical History:  Procedure Laterality Date   CESAREAN SECTION     COLONOSCOPY  02/19/2011   Fields-incomplete exam, otherwise normal    COLONOSCOPY N/A 03/20/2018   Procedure: COLONOSCOPY;  Surgeon: Harvey Margo CROME, MD;  Location: [facility];  Service: Endoscopy;  Laterality: N/A;  9:30   KNEE ARTHROSCOPY WITH MEDIAL MENISECTOMY Right 04/27/2018   Procedure: RIGHT KNEE ARTHROSCOPY WITH PARTIAL MEDIAL MENISECTOMY;  Surgeon: Margrette Taft BRAVO, MD;  Location: [facility];  Service: Orthopedics;  Laterality: Right;   LAPAROSCOPIC SUPRACERVICAL HYSTERECTOMY  03/02/2011   Procedure: LAPAROSCOPIC SUPRACERVICAL HYSTERECTOMY;  Surgeon: Norleen LULLA Server, MD;  Location: [facility];  Service: Gynecology;  Laterality: N/A;   PARTIAL HYSTERECTOMY     ROBOT ASSISTED LAPAROSCOPIC NEPHRECTOMY Right 05/16/2017   Procedure: XI ROBOTIC ASSISTED LAPAROSCOPIC PARTIAL NEPHRECTOMY;  Surgeon: Renda Glance, MD;  Location: [facility];  Service: Urology;   Laterality: Right;   ROTATOR CUFF REPAIR     left   TUBAL LIGATION     Family History  Problem Relation Age of Onset   Heart disease Mother    Dementia Mother    Stroke Mother    Congestive Heart Failure Father    Cancer Father    Mental illness Brother    Early death Sister    Alcohol abuse Sister    Stroke Sister    Mental illness Brother    Mental illness Brother    Mental illness Sister    Colon cancer Neg Hx    Social History   Occupational History   Occupation: Estate Agent, Theatre Stage Manager    Employer: [employer]  Tobacco Use   Smoking status: Never   Smokeless tobacco: Never  Vaping Use   Vaping status: Never Used  Substance and Sexual Activity   Alcohol use: No   Drug use: No   Sexual activity: Not Currently    Birth control/protection: Surgical    Comment: [facility]   Tobacco Counseling Counseling given: Yes  SDOH Screenings   Food Insecurity: No Food Insecurity (04/26/2024)  Housing: Low Risk (04/26/2024)  Transportation Needs: No Transportation Needs (04/26/2024)  Utilities: Not At Risk (04/26/2024)  Alcohol Screen: Low Risk (03/01/2024)  Depression (PHQ2-9): Low Risk (04/26/2024)  Financial Resource Strain: Low Risk (03/01/2024)  Physical Activity: Sufficiently Active (04/26/2024)  Social Connections: Socially Integrated (04/26/2024)  Stress: No Stress Concern Present (04/26/2024)  Tobacco Use: Low Risk (04/26/2024)  Health Literacy: Adequate Health Literacy (04/26/2024)   See flowsheets for full screening details  Depression Screen PHQ 2 & 9 Depression Scale- Over the past 2 weeks, how often have you been bothered by any of the following problems? Little interest or pleasure in doing things: 0 Feeling down, depressed, or hopeless (PHQ Adolescent also includes...irritable): 0 PHQ-2 Total Score: 0 Trouble falling or staying asleep, or sleeping too much: 0 Feeling tired or having little energy: 0 Poor appetite or overeating (PHQ Adolescent also  includes...weight loss): 0 Feeling bad about yourself - or that you are a failure or have let yourself or your family down: 0 Trouble concentrating on things, such as reading the newspaper or watching television (PHQ Adolescent also includes...like school work): 0 Moving or speaking so slowly that other people could have noticed. Or the opposite - being so fidgety or restless that you have been moving around a lot more than usual: 0 Thoughts that you would be better off dead, or of hurting yourself in some way: 0 PHQ-9 Total Score: 0 If you checked off any problems, how difficult have these problems made it for you to do your work, take care of things at home, or get along with other people?: Not difficult at all     Goals Addressed               This Visit's Progress     I would like to live pain free (pt-stated)            Objective:    Today's Vitals   04/26/24 1039  Weight: 235 lb (106.6 kg)  Height: 5' 2 (1.575 m)   Body mass index is 42.98 kg/m.  Hearing/Vision screen Hearing Screening - Comments:: Patient denies any hearing difficulties.   Vision Screening - Comments:: Patient has had multiple appointments scheduled and office changes appt. She will try the other locations. List of eye doctors given to patient Immunizations and Health Maintenance Health Maintenance  Topic Date Due   Zoster Vaccines- Shingrix (1 of 2) Never done   Influenza Vaccine  Never done   HIV Screening  10/26/2024 (Originally 12/08/1975)   Pneumococcal Vaccine: 50+ Years (1 of 2 - PCV) 12/01/2024 (Originally 12/08/1979)   Mammogram  11/06/2024   Medicare Annual Wellness (AWV)  04/26/2025   Colonoscopy  03/20/2028   Cervical Cancer Screening (HPV/Pap Cotest)  03/01/2029   HPV VACCINES (No Doses Required) Completed   Hepatitis C Screening  Completed   Hepatitis B Vaccines 19-59 Average Risk  Aged Out   Meningococcal B Vaccine  Aged Out   DTaP/Tdap/Td  Discontinued   COVID-19 Vaccine   Discontinued        Assessment/Plan:  This is a routine wellness examination for Khalea.  Patient Care Team: Cook, Jayce G, DO as PCP - General (Family Medicine) Margrette Taft BRAVO, MD as Consulting Physician (Orthopedic Surgery) Renda Glance, MD as Consulting Physician (Urology) Darroll Anes, DO (Optometry) Darlean Ozell NOVAK, MD as Consulting Physician (Pulmonary Disease)  I have personally reviewed and noted the following in the patients chart:   Medical and social history Use of alcohol, tobacco or illicit drugs  Current medications and supplements including opioid prescriptions. Functional ability and status Nutritional status Physical activity Advanced directives List of other physicians Hospitalizations, surgeries, and ER visits in previous 12 months Vitals Screenings to include cognitive, depression, and falls Referrals and appointments  No orders of the defined types were placed in this  encounter.  In addition, I have reviewed and discussed with patient certain preventive protocols, quality metrics, and best practice recommendations. A written personalized care plan for preventive services as well as general preventive health recommendations were provided to patient.   Bera Pinela, CMA   04/26/2024   Return May 02, 2025 at 10:40 am, for In office Medicare Well Visit w  Wellness Nurse.  After Visit Summary: (Mail) Due to this being a telephonic visit, the after visit summary with patients personalized plan was offered to patient via mail   "

## 2024-05-01 ENCOUNTER — Ambulatory Visit: Admitting: Family Medicine

## 2024-05-09 ENCOUNTER — Ambulatory Visit (INDEPENDENT_AMBULATORY_CARE_PROVIDER_SITE_OTHER): Admitting: Family Medicine

## 2024-05-09 VITALS — BP 114/77 | HR 75 | Temp 97.5°F | Ht 62.0 in | Wt 250.8 lb

## 2024-05-09 DIAGNOSIS — N1831 Chronic kidney disease, stage 3a: Secondary | ICD-10-CM

## 2024-05-09 DIAGNOSIS — I10 Essential (primary) hypertension: Secondary | ICD-10-CM

## 2024-05-09 DIAGNOSIS — E785 Hyperlipidemia, unspecified: Secondary | ICD-10-CM

## 2024-05-09 DIAGNOSIS — R7303 Prediabetes: Secondary | ICD-10-CM | POA: Diagnosis not present

## 2024-05-09 DIAGNOSIS — M79672 Pain in left foot: Secondary | ICD-10-CM

## 2024-05-09 MED ORDER — PREDNISONE 10 MG (21) PO TBPK: ORAL_TABLET | ORAL | 0 refills | Status: AC

## 2024-05-09 NOTE — Patient Instructions (Signed)
 Prednisone  as prescribed.  Ice, gentle stretching. Heel inserts.  Labs today.  Follow up in 6 months.

## 2024-05-09 NOTE — Assessment & Plan Note (Signed)
Stable.  Continue triamterene/HCTZ. 

## 2024-05-09 NOTE — Assessment & Plan Note (Signed)
 Has been stable.  Metabolic panel today.

## 2024-05-09 NOTE — Assessment & Plan Note (Signed)
 Uncontrolled.  He is not taking statin at this time.  Reevaluating today with lipid panel.

## 2024-05-09 NOTE — Assessment & Plan Note (Signed)
 Secondary to plantar fasciitis.  Treating with corticosteroids, rest, ice, exercises, heel cups/inserts.

## 2024-05-09 NOTE — Progress Notes (Signed)
 "  Subjective:  Patient ID: Jo Hale, female    DOB: [birth year]  Age: 64 y.o. MRN: [id number]  CC:   Chief Complaint  Patient presents with   Foot Pain    Pt states her left heal has been bothering her for two weeks, when she gets up at night, she has to hold on to the wall. States sititng and standing makes it worse. Pt is also requesting annual bloodwork to be done.    HPI:  64 year old female with a below mentioned medical problems presents for follow-up.  Patient reports that she has been experiencing left heel pain weeks.  Seems to be worse when she is bearing weight.  No known inciting factor.  No relieving factors.  Hypertension stable on triamterene /HCTZ.  Last LDL was 126.SABRA  Needs lipid panel today to assess.  Goal less than 70.    Patient Active Problem List   Diagnosis Date Noted   Pain of left heel 05/09/2024   Morbid obesity due to excess calories (HCC) 12/02/2023   Annual physical exam 10/27/2023   S/P abdominal supracervical subtotal hysterectomy 09/10/2021   CKD (chronic kidney disease) stage 3, GFR 30-59 ml/min (HCC) 04/27/2021   Hyperlipidemia 04/27/2021   Chronic low back pain 04/27/2021   Knee osteoarthritis 04/27/2021   Neoplasm of right kidney 05/16/2017   History of stroke 09/18/2015   Essential hypertension, benign 01/25/2013    Social Hx   Social History   Socioeconomic History   Marital status: Married    Spouse name: Ubaldo   Number of children: 2   Years of education: Not on file   Highest education level: Not on file  Occupational History   Occupation: Estate Agent, Theatre Stage Manager    Employer: [employer]  Tobacco Use   Smoking status: Never   Smokeless tobacco: Never  Vaping Use   Vaping status: Never Used  Substance and Sexual Activity   Alcohol use: No   Drug use: No   Sexual activity: Not Currently    Birth control/protection: Surgical    Comment: [facility]  Other Topics Concern   Not on file  Social History Narrative    Married x 37 years in 2023.   1 son and 1 daughter   7 grandchildren.   Social Drivers of Health   Tobacco Use: Low Risk (04/26/2024)   Patient History    Smoking Tobacco Use: Never    Smokeless Tobacco Use: Never    Passive Exposure: Not on file  Financial Resource Strain: Low Risk (03/01/2024)   Overall Financial Resource Strain (CARDIA)    Difficulty of Paying Living Expenses: Not hard at all  Food Insecurity: No Food Insecurity (04/26/2024)   Epic    Worried About Programme Researcher, Broadcasting/film/video in the Last Year: Never true    Ran Out of Food in the Last Year: Never true  Transportation Needs: No Transportation Needs (04/26/2024)   Epic    Lack of Transportation (Medical): No    Lack of Transportation (Non-Medical): No  Physical Activity: Sufficiently Active (04/26/2024)   Exercise Vital Sign    Days of Exercise per Week: 7 days    Minutes of Exercise per Session: 30 min  Stress: No Stress Concern Present (04/26/2024)   [employer] of Occupational Health - Occupational Stress Questionnaire    Feeling of Stress: Not at all  Social Connections: Socially Integrated (04/26/2024)   Social Connection and Isolation Panel    Frequency of Communication with Friends and Family: More than  three times a week    Frequency of Social Gatherings with Friends and Family: More than three times a week    Attends Religious Services: More than 4 times per year    Active Member of Clubs or Organizations: Yes    Attends Banker Meetings: More than 4 times per year    Marital Status: Married  Depression (PHQ2-9): Low Risk (05/09/2024)   Depression (PHQ2-9)    PHQ-2 Score: 0  Alcohol Screen: Low Risk (03/01/2024)   Alcohol Screen    Last Alcohol Screening Score (AUDIT): 0  Housing: Low Risk (04/26/2024)   Epic    Unable to Pay for Housing in the Last Year: No    Number of Times Moved in the Last Year: 0    Homeless in the Last Year: No  Utilities: Not At Risk (04/26/2024)   Epic     Threatened with loss of utilities: No  Health Literacy: Adequate Health Literacy (04/26/2024)   B1300 Health Literacy    Frequency of need for help with medical instructions: Never    Review of Systems Per HPI  Objective:  BP 114/77   Pulse 75   Temp (!) 97.5 F (36.4 C)   Ht 5' 2 (1.575 m)   Wt 250 lb 12.8 oz (113.8 kg)   LMP 02/17/2011 Comment: partial   SpO2 99%   BMI 45.87 kg/m      05/09/2024   10:05 AM 04/26/2024   10:39 AM 03/13/2024    4:07 PM  BP/Weight  Systolic BP 114 -- 132  Diastolic BP 77 -- 61  Wt. (Lbs) 250.8 235 248.5  BMI 45.87 kg/m2 42.98 kg/m2 45.45 kg/m2    Physical Exam Vitals and nursing note reviewed.  Constitutional:      General: She is not in acute distress.    Appearance: Normal appearance. She is obese.  HENT:     Head: Normocephalic and atraumatic.  Eyes:     General:        Right eye: No discharge.        Left eye: No discharge.     Conjunctiva/sclera: Conjunctivae normal.  Cardiovascular:     Rate and Rhythm: Normal rate and regular rhythm.  Pulmonary:     Effort: Pulmonary effort is normal.     Breath sounds: Normal breath sounds. No wheezing, rhonchi or rales.  Feet:     Comments: Plantar aspect of the left heel with tenderness to palpation. Neurological:     Mental Status: She is alert.  Psychiatric:        Mood and Affect: Mood normal.        Behavior: Behavior normal.     Lab Results  Component Value Date   WBC 4.1 11/04/2023   HGB 13.0 11/04/2023   HCT 40.5 11/04/2023   PLT 239 11/04/2023   GLUCOSE 107 (H) 10/05/2023   CHOL 197 10/05/2023   TRIG 131 10/05/2023   HDL 48 10/05/2023   LDLCALC 126 (H) 10/05/2023   ALT 30 10/05/2023   AST 26 10/05/2023   NA 141 10/05/2023   K 3.7 10/05/2023   CL 104 10/05/2023   CREATININE 1.18 (H) 10/05/2023   BUN 13 10/05/2023   CO2 22 10/05/2023   TSH 1.310 01/14/2020   INR 1.07 07/02/2015   HGBA1C 5.7 (H) 10/05/2023     Assessment & Plan:  Essential hypertension,  benign Assessment & Plan: Stable.  Continue triamterene /HCTZ.   Stage 3a chronic kidney disease (HCC) Assessment &  Plan: Has been stable.  Metabolic panel today.  Orders: -     CBC -     Microalbumin / creatinine urine ratio -     CMP14+EGFR  Hyperlipidemia, unspecified hyperlipidemia type Assessment & Plan: Uncontrolled.  He is not taking statin at this time.  Reevaluating today with lipid panel.  Orders: -     Lipid panel  Prediabetes -     Hemoglobin A1c  Pain of left heel Assessment & Plan: Secondary to plantar fasciitis.  Treating with corticosteroids, rest, ice, exercises, heel cups/inserts.  Orders: -     predniSONE ; 6 tablets on day 1; decrease by 1 tablet daily until gone.  Dispense: 21 tablet; Refill: 0    Follow-up:  Return in about 6 months (around 11/06/2024).  Jacqulyn Ahle DO [facility] "

## 2024-05-10 ENCOUNTER — Ambulatory Visit: Payer: Self-pay | Admitting: Family Medicine

## 2024-05-10 LAB — CMP14+EGFR
ALT: 21 [IU]/L (ref 0–32)
AST: 25 [IU]/L (ref 0–40)
Albumin: 3.9 g/dL (ref 3.9–4.9)
Alkaline Phosphatase: 89 [IU]/L (ref 49–135)
BUN/Creatinine Ratio: 14 (ref 12–28)
BUN: 17 mg/dL (ref 8–27)
Bilirubin Total: 0.3 mg/dL (ref 0.0–1.2)
CO2: 23 mmol/L (ref 20–29)
Calcium: 9 mg/dL (ref 8.7–10.3)
Chloride: 106 mmol/L (ref 96–106)
Creatinine, Ser: 1.2 mg/dL — ABNORMAL HIGH (ref 0.57–1.00)
Globulin, Total: 2.4 g/dL (ref 1.5–4.5)
Glucose: 102 mg/dL — ABNORMAL HIGH (ref 70–99)
Potassium: 3.8 mmol/L (ref 3.5–5.2)
Sodium: 143 mmol/L (ref 134–144)
Total Protein: 6.3 g/dL (ref 6.0–8.5)
eGFR: 51 mL/min/{1.73_m2} — ABNORMAL LOW

## 2024-05-10 LAB — CBC
Hematocrit: 39.8 % (ref 34.0–46.6)
Hemoglobin: 13.2 g/dL (ref 11.1–15.9)
MCH: 31.5 pg (ref 26.6–33.0)
MCHC: 33.2 g/dL (ref 31.5–35.7)
MCV: 95 fL (ref 79–97)
Platelets: 228 10*3/uL (ref 150–450)
RBC: 4.19 x10E6/uL (ref 3.77–5.28)
RDW: 12.4 % (ref 11.7–15.4)
WBC: 4.3 10*3/uL (ref 3.4–10.8)

## 2024-05-10 LAB — LIPID PANEL
Chol/HDL Ratio: 3.4 ratio (ref 0.0–4.4)
Cholesterol, Total: 195 mg/dL (ref 100–199)
HDL: 57 mg/dL
LDL Chol Calc (NIH): 126 mg/dL — ABNORMAL HIGH (ref 0–99)
Triglycerides: 68 mg/dL (ref 0–149)
VLDL Cholesterol Cal: 12 mg/dL (ref 5–40)

## 2024-05-10 LAB — HEMOGLOBIN A1C
Est. average glucose Bld gHb Est-mCnc: 114 mg/dL
Hgb A1c MFr Bld: 5.6 % (ref 4.8–5.6)

## 2024-05-10 LAB — MICROALBUMIN / CREATININE URINE RATIO
Creatinine, Urine: 198.5 mg/dL
Microalb/Creat Ratio: <2 mg/g{creat} (ref 0–29)
Microalbumin, Urine: <3 ug/mL

## 2024-11-06 ENCOUNTER — Ambulatory Visit: Admitting: Family Medicine

## 2025-05-02 ENCOUNTER — Ambulatory Visit
